# Patient Record
Sex: Female | Born: 1940 | Race: Black or African American | Hispanic: No | Smoking: Never smoker
Health system: Southern US, Community
[De-identification: ages and names within clinical notes are randomized; demographics above are authoritative.]

## PROBLEM LIST (undated history)

## (undated) DIAGNOSIS — I509 Heart failure, unspecified: Secondary | ICD-10-CM

## (undated) DIAGNOSIS — T4145XA Adverse effect of unspecified anesthetic, initial encounter: Secondary | ICD-10-CM

## (undated) DIAGNOSIS — I639 Cerebral infarction, unspecified: Secondary | ICD-10-CM

## (undated) DIAGNOSIS — I10 Essential (primary) hypertension: Secondary | ICD-10-CM

## (undated) DIAGNOSIS — I469 Cardiac arrest, cause unspecified: Secondary | ICD-10-CM

## (undated) DIAGNOSIS — E119 Type 2 diabetes mellitus without complications: Secondary | ICD-10-CM

## (undated) DIAGNOSIS — Z8719 Personal history of other diseases of the digestive system: Secondary | ICD-10-CM

## (undated) DIAGNOSIS — K219 Gastro-esophageal reflux disease without esophagitis: Secondary | ICD-10-CM

## (undated) DIAGNOSIS — T8859XA Other complications of anesthesia, initial encounter: Secondary | ICD-10-CM

## (undated) DIAGNOSIS — I739 Peripheral vascular disease, unspecified: Secondary | ICD-10-CM

## (undated) DIAGNOSIS — I214 Non-ST elevation (NSTEMI) myocardial infarction: Secondary | ICD-10-CM

## (undated) DIAGNOSIS — I209 Angina pectoris, unspecified: Secondary | ICD-10-CM

## (undated) DIAGNOSIS — E7800 Pure hypercholesterolemia, unspecified: Secondary | ICD-10-CM

## (undated) DIAGNOSIS — I219 Acute myocardial infarction, unspecified: Secondary | ICD-10-CM

## (undated) DIAGNOSIS — I4901 Ventricular fibrillation: Secondary | ICD-10-CM

## (undated) HISTORY — PX: BREAST CYST EXCISION: SHX579

## (undated) HISTORY — PX: CORONARY ARTERY BYPASS GRAFT: SHX141

## (undated) HISTORY — PX: ABDOMINAL HYSTERECTOMY: SHX81

## (undated) HISTORY — PX: BREAST BIOPSY: SHX20

---

## 2006-10-29 ENCOUNTER — Encounter: Admission: RE | Admit: 2006-10-29 | Discharge: 2006-10-29 | Payer: Self-pay | Admitting: Internal Medicine

## 2006-12-29 ENCOUNTER — Encounter: Admission: RE | Admit: 2006-12-29 | Discharge: 2007-03-29 | Payer: Self-pay | Admitting: Internal Medicine

## 2007-05-11 ENCOUNTER — Encounter: Admission: RE | Admit: 2007-05-11 | Discharge: 2007-05-11 | Payer: Self-pay | Admitting: Internal Medicine

## 2007-06-03 ENCOUNTER — Encounter: Admission: RE | Admit: 2007-06-03 | Discharge: 2007-06-03 | Payer: Self-pay | Admitting: Internal Medicine

## 2007-06-18 ENCOUNTER — Encounter: Admission: RE | Admit: 2007-06-18 | Discharge: 2007-06-18 | Payer: Self-pay | Admitting: Internal Medicine

## 2007-06-25 ENCOUNTER — Encounter: Admission: RE | Admit: 2007-06-25 | Discharge: 2007-06-25 | Payer: Self-pay | Admitting: Internal Medicine

## 2008-02-09 ENCOUNTER — Emergency Department (HOSPITAL_COMMUNITY): Admission: EM | Admit: 2008-02-09 | Discharge: 2008-02-10 | Payer: Self-pay | Admitting: Emergency Medicine

## 2008-03-10 ENCOUNTER — Ambulatory Visit (HOSPITAL_COMMUNITY): Admission: RE | Admit: 2008-03-10 | Discharge: 2008-03-10 | Payer: Self-pay | Admitting: Internal Medicine

## 2008-06-12 ENCOUNTER — Emergency Department (HOSPITAL_COMMUNITY): Admission: EM | Admit: 2008-06-12 | Discharge: 2008-06-12 | Payer: Self-pay | Admitting: Emergency Medicine

## 2008-08-15 ENCOUNTER — Encounter: Admission: RE | Admit: 2008-08-15 | Discharge: 2008-08-15 | Payer: Self-pay | Admitting: Internal Medicine

## 2009-04-18 ENCOUNTER — Encounter: Admission: RE | Admit: 2009-04-18 | Discharge: 2009-04-18 | Payer: Self-pay | Admitting: Internal Medicine

## 2009-04-20 ENCOUNTER — Encounter: Admission: RE | Admit: 2009-04-20 | Discharge: 2009-04-20 | Payer: Self-pay | Admitting: Internal Medicine

## 2010-03-17 ENCOUNTER — Encounter: Payer: Self-pay | Admitting: Emergency Medicine

## 2010-03-18 ENCOUNTER — Encounter: Payer: Self-pay | Admitting: Internal Medicine

## 2010-06-10 ENCOUNTER — Other Ambulatory Visit (HOSPITAL_COMMUNITY): Payer: Self-pay | Admitting: Internal Medicine

## 2010-06-13 ENCOUNTER — Inpatient Hospital Stay (HOSPITAL_COMMUNITY): Admission: RE | Admit: 2010-06-13 | Payer: Self-pay | Source: Ambulatory Visit

## 2010-06-13 ENCOUNTER — Ambulatory Visit (HOSPITAL_COMMUNITY): Payer: Self-pay

## 2010-06-14 ENCOUNTER — Other Ambulatory Visit (HOSPITAL_COMMUNITY): Payer: Self-pay | Admitting: Internal Medicine

## 2010-06-18 ENCOUNTER — Other Ambulatory Visit (HOSPITAL_COMMUNITY): Payer: Self-pay | Admitting: Internal Medicine

## 2010-06-19 ENCOUNTER — Ambulatory Visit (HOSPITAL_COMMUNITY): Payer: Self-pay

## 2010-06-19 ENCOUNTER — Other Ambulatory Visit (HOSPITAL_COMMUNITY): Payer: Self-pay

## 2010-06-19 ENCOUNTER — Ambulatory Visit (HOSPITAL_COMMUNITY)
Admission: RE | Admit: 2010-06-19 | Discharge: 2010-06-19 | Disposition: A | Discharge status: Home / Self Care | Payer: Medicare HMO | Source: Ambulatory Visit | Attending: Internal Medicine | Admitting: Internal Medicine

## 2010-06-28 ENCOUNTER — Ambulatory Visit (HOSPITAL_COMMUNITY)
Admission: RE | Admit: 2010-06-28 | Discharge: 2010-06-28 | Disposition: A | Discharge status: Home / Self Care | Payer: Medicare HMO | Source: Ambulatory Visit | Attending: Internal Medicine | Admitting: Internal Medicine

## 2010-06-28 ENCOUNTER — Other Ambulatory Visit (HOSPITAL_COMMUNITY): Payer: Self-pay | Admitting: Internal Medicine

## 2010-06-28 DIAGNOSIS — R1310 Dysphagia, unspecified: Secondary | ICD-10-CM | POA: Insufficient documentation

## 2010-11-13 ENCOUNTER — Other Ambulatory Visit: Payer: Self-pay | Admitting: Internal Medicine

## 2010-11-13 ENCOUNTER — Ambulatory Visit
Admission: RE | Admit: 2010-11-13 | Discharge: 2010-11-13 | Disposition: A | Discharge status: Home / Self Care | Payer: Medicare HMO | Source: Ambulatory Visit | Attending: Internal Medicine | Admitting: Internal Medicine

## 2010-11-13 DIAGNOSIS — R05 Cough: Secondary | ICD-10-CM

## 2011-02-20 ENCOUNTER — Encounter: Payer: Self-pay | Admitting: Emergency Medicine

## 2011-02-20 ENCOUNTER — Emergency Department (HOSPITAL_COMMUNITY)
Admission: EM | Admit: 2011-02-20 | Discharge: 2011-02-20 | Disposition: A | Discharge status: Home / Self Care | Payer: Medicare HMO | Attending: Emergency Medicine | Admitting: Emergency Medicine

## 2011-02-20 ENCOUNTER — Emergency Department (HOSPITAL_COMMUNITY): Payer: Medicare HMO

## 2011-02-20 DIAGNOSIS — R1310 Dysphagia, unspecified: Secondary | ICD-10-CM | POA: Insufficient documentation

## 2011-02-20 DIAGNOSIS — I10 Essential (primary) hypertension: Secondary | ICD-10-CM | POA: Insufficient documentation

## 2011-02-20 DIAGNOSIS — Z8673 Personal history of transient ischemic attack (TIA), and cerebral infarction without residual deficits: Secondary | ICD-10-CM | POA: Insufficient documentation

## 2011-02-20 DIAGNOSIS — Z794 Long term (current) use of insulin: Secondary | ICD-10-CM | POA: Insufficient documentation

## 2011-02-20 DIAGNOSIS — E7800 Pure hypercholesterolemia, unspecified: Secondary | ICD-10-CM | POA: Insufficient documentation

## 2011-02-20 DIAGNOSIS — R079 Chest pain, unspecified: Secondary | ICD-10-CM | POA: Insufficient documentation

## 2011-02-20 DIAGNOSIS — R070 Pain in throat: Secondary | ICD-10-CM | POA: Insufficient documentation

## 2011-02-20 DIAGNOSIS — R471 Dysarthria and anarthria: Secondary | ICD-10-CM | POA: Insufficient documentation

## 2011-02-20 HISTORY — DX: Essential (primary) hypertension: I10

## 2011-02-20 HISTORY — DX: Cerebral infarction, unspecified: I63.9

## 2011-02-20 HISTORY — DX: Pure hypercholesterolemia, unspecified: E78.00

## 2011-02-20 LAB — GLUCOSE, CAPILLARY
Glucose-Capillary: 132 mg/dL — ABNORMAL HIGH (ref 70–99)
Glucose-Capillary: 125 mg/dL — ABNORMAL HIGH (ref 70–99)

## 2011-02-20 MED ORDER — PANTOPRAZOLE SODIUM 40 MG PO PACK
40.0000 mg | PACK | Freq: Every day | ORAL | Status: DC
  Administered 2011-02-20: 40 mg via ORAL
  Filled 2011-02-20: qty 20

## 2011-02-20 MED ORDER — PANTOPRAZOLE SODIUM 40 MG PO PACK: 40.0000 mg | PACK | ORAL | Status: DC

## 2011-02-20 MED ORDER — PANTOPRAZOLE SODIUM 40 MG PO PACK
40.0000 mg | PACK | ORAL | Status: AC
  Administered 2011-02-20: 40 mg via ORAL
  Filled 2011-02-20: qty 20

## 2011-02-20 MED ORDER — DEXTROSE 50 % IV SOLN
INTRAVENOUS | Status: AC
  Filled 2011-02-20: qty 50

## 2011-02-20 MED ORDER — DEXTROSE 50 % IV SOLN
50.0000 mL | Freq: Once | INTRAVENOUS | Status: AC
  Administered 2011-02-20: 12:00:00 via INTRAVENOUS
  Filled 2011-02-20: qty 50

## 2011-02-20 MED ORDER — GLUCAGON HCL (RDNA) 1 MG IJ SOLR
1.0000 mg | Freq: Once | INTRAMUSCULAR | Status: AC
  Administered 2011-02-20: 1 mg via INTRAVENOUS
  Filled 2011-02-20: qty 1

## 2011-02-20 MED ORDER — GLUCOSE 40 % PO GEL
ORAL | Status: AC
  Administered 2011-02-20: 12:00:00
  Filled 2011-02-20: qty 1

## 2011-02-20 NOTE — ED Notes (Signed)
CBG 26 at 11:48

## 2011-02-20 NOTE — ED Provider Notes (Signed)
History     CSN: [id number]  Arrival date & time 02/20/11  1124   First MD Initiated Contact with Patient 02/20/11 1144      Chief Complaint  Patient presents with  . Sore Throat    x 1 week    (Consider location/radiation/quality/duration/timing/severity/associated sxs/prior treatment) Patient is a 70 y.o. female presenting with pharyngitis. The history is provided by the patient and the EMS personnel. No language interpreter was used.  Sore Throat This is a recurrent problem. The current episode started yesterday. The problem occurs constantly (With every swallow). The problem has been unchanged. Associated symptoms include chest pain. Pertinent negatives include no chills, coughing, nausea or vomiting. Associated symptoms comments: Dysphagia. The symptoms are aggravated by drinking. She has tried nothing for the symptoms. The treatment provided no relief.    Past Medical History  Diagnosis Date  . Stroke   . Hypertension   . Hypercholesteremia     History reviewed. No pertinent past surgical history.  History reviewed. No pertinent family history.  History  Substance Use Topics  . Smoking status: Not on file  . Smokeless tobacco: Not on file  . Alcohol Use: No    OB History    Grav Para Term Preterm Abortions TAB SAB Ect Mult Living                  Review of Systems  Constitutional: Negative for chills.  Respiratory: Negative for cough.   Cardiovascular: Positive for chest pain.  Gastrointestinal: Negative for nausea and vomiting.  All other systems reviewed and are negative.    Allergies  Review of patient's allergies indicates no known allergies.  Home Medications   Current Outpatient Rx  Name Route Sig Dispense Refill  . INSULIN ASPART PROT & ASPART (70-30) 100 UNIT/ML [place] SUSP Subcutaneous Inject 40 Units into the skin 2 (two) times daily. 27 units-morning 18 units-evening       BP 125/71  Pulse 88  Temp(Src) 97.9 F (36.6 C) (Oral)  Resp  20  SpO2 100%  Physical Exam  Nursing note and vitals reviewed. Constitutional: She is oriented to person, place, and time. She appears well-developed and well-nourished. No distress.  HENT:  Head: Normocephalic and atraumatic.  Eyes: EOM are normal. Pupils are equal, round, and reactive to light.  Neck: Normal range of motion. Neck supple.  Cardiovascular: Normal rate and regular rhythm.  Exam reveals no friction rub.   No murmur heard. Pulmonary/Chest: Effort normal and breath sounds normal. No respiratory distress. She has no wheezes. She has no rales.  Abdominal: Soft. She exhibits no distension. There is no tenderness. There is no rebound.  Musculoskeletal: Normal range of motion. She exhibits no edema.       Right side contracted from previous stroke.  Neurological: She is alert and oriented to person, place, and time.       Patient has severe dysarthria secondary to her previous stroke. She is able to understand questions though and writes down her answers to what she wants to say.  Skin: She is not diaphoretic.    ED Course  Procedures (including critical care time)  Labs Reviewed  GLUCOSE, CAPILLARY - Abnormal; Notable for the following:    Glucose-Capillary 26 (*)    All other components within normal limits  POCT CBG MONITORING   Dg Chest 2 View  02/20/2011  *RADIOLOGY REPORT*  Clinical Data: Chest pain and trouble swallowing.  Cough.  CHEST - 2 VIEW  Comparison: 11/13/2010.  Findings: Trachea is midline.  Heart size normal.  There may be mild bibasilar atelectasis.  Lungs are low in volume.  No pleural fluid.  IMPRESSION: Low lung volumes with probable mild bibasilar atelectasis.  Original Report Authenticated By: Reyes Ivan, M.D.     1. Dysphagia     MDM  A 70 year old female presents with odynophagia.  patient has a history of stroke and is in a rehabilitation center. The rehabilitation therapist that she was having chest pain. Patient has history of  hiatal hernia and feels like her hiatal hernia is acting up. Patient wrote to me that she feels like she swallowed a rock. The patient has not eaten any rocks.  Patient last night however does not feel like food is stuck in her chest. Patient is not having any dyspnea or pain she feels is related to her heart. No nausea or vomiting. Denies fevers. On arrival afebrile vital signs stable. A blood glucose check was found to have a glucose of 23. Patient was given D50 and IV and also was given glucose gel in her mouth. Patient is drooling, however she is able to swallow.  Unable to tell if drooling is secondary to her previous stroke or secondary to her dysphagia today. We'll check chest x-ray and give glucagon IV. Glucagon will help with her blood sugar and also help with any esophageal spasm. Spoke with patient's family state that her drooling as a consequence of her stroke. Informed patient lives at home and has been home speech therapy and home health. Patient has had similar complaints before and pain has been going on for about a week however increased last night.  And repeat glucose checks were stable in the low 100s. Patient was able to tolerate swallowing liquids and thick liquids and applesauce after on administration. On further discussion with the family patient has history of acid reflux. Patient given liquid PPI medication On reexam, patient reports she is feeling better. Tolerating by mouth well without complication. Patient stable for discharge. Patient given a prescription for Protonix liquid and instructed to follow up with her regular doctor this week.  Elwin Mocha, MD 02/20/11 [phone]

## 2011-02-20 NOTE — ED Notes (Signed)
CBG 132 at 12:36

## 2011-02-20 NOTE — ED Notes (Signed)
CBG 125 at 14:05

## 2011-02-20 NOTE — ED Notes (Signed)
Pt placed in gown, on monitor with continuous blood pressure and pulse oximetry 

## 2011-02-20 NOTE — ED Notes (Signed)
Received pt from [facility] for further eval of Throat pain x 1 week. Pt has history of CVA with right sided deficit and non-verbal.

## 2011-02-22 NOTE — ED Provider Notes (Signed)
I saw and evaluated the patient, reviewed the resident's note and I agree with the findings and plan. Pt c/o chronic dysphagia post prior cva. Pt says nothing got stuck when swallowing, no esophageal fb sensation. No sob. Pt able to swallow/drink fluids/meal in ed. bs remains normal.   Suzi Roots, MD 02/22/11 [phone]

## 2011-02-25 HISTORY — PX: CARDIAC CATHETERIZATION: SHX172

## 2011-03-09 ENCOUNTER — Encounter (HOSPITAL_COMMUNITY): Payer: Self-pay | Admitting: Emergency Medicine

## 2011-03-09 ENCOUNTER — Emergency Department (HOSPITAL_COMMUNITY): Payer: Medicare HMO

## 2011-03-09 ENCOUNTER — Inpatient Hospital Stay (HOSPITAL_COMMUNITY)
Admission: EM | Admit: 2011-03-09 | Discharge: 2011-03-13 | DRG: 281 | Disposition: A | Discharge status: Home / Self Care | Payer: Medicare HMO | Attending: Internal Medicine | Admitting: Internal Medicine

## 2011-03-09 ENCOUNTER — Other Ambulatory Visit: Payer: Self-pay

## 2011-03-09 DIAGNOSIS — I69920 Aphasia following unspecified cerebrovascular disease: Secondary | ICD-10-CM

## 2011-03-09 DIAGNOSIS — Z9861 Coronary angioplasty status: Secondary | ICD-10-CM

## 2011-03-09 DIAGNOSIS — I69959 Hemiplegia and hemiparesis following unspecified cerebrovascular disease affecting unspecified side: Secondary | ICD-10-CM

## 2011-03-09 DIAGNOSIS — N390 Urinary tract infection, site not specified: Secondary | ICD-10-CM | POA: Diagnosis present

## 2011-03-09 DIAGNOSIS — I214 Non-ST elevation (NSTEMI) myocardial infarction: Secondary | ICD-10-CM

## 2011-03-09 DIAGNOSIS — T82897A Other specified complication of cardiac prosthetic devices, implants and grafts, initial encounter: Secondary | ICD-10-CM | POA: Diagnosis present

## 2011-03-09 DIAGNOSIS — Z951 Presence of aortocoronary bypass graft: Secondary | ICD-10-CM

## 2011-03-09 DIAGNOSIS — Z794 Long term (current) use of insulin: Secondary | ICD-10-CM

## 2011-03-09 DIAGNOSIS — I2510 Atherosclerotic heart disease of native coronary artery without angina pectoris: Secondary | ICD-10-CM | POA: Diagnosis present

## 2011-03-09 DIAGNOSIS — K449 Diaphragmatic hernia without obstruction or gangrene: Secondary | ICD-10-CM | POA: Diagnosis present

## 2011-03-09 DIAGNOSIS — Z7902 Long term (current) use of antithrombotics/antiplatelets: Secondary | ICD-10-CM

## 2011-03-09 DIAGNOSIS — I25810 Atherosclerosis of coronary artery bypass graft(s) without angina pectoris: Secondary | ICD-10-CM | POA: Diagnosis present

## 2011-03-09 DIAGNOSIS — I69922 Dysarthria following unspecified cerebrovascular disease: Secondary | ICD-10-CM

## 2011-03-09 DIAGNOSIS — E785 Hyperlipidemia, unspecified: Secondary | ICD-10-CM | POA: Diagnosis present

## 2011-03-09 DIAGNOSIS — B964 Proteus (mirabilis) (morganii) as the cause of diseases classified elsewhere: Secondary | ICD-10-CM | POA: Diagnosis present

## 2011-03-09 DIAGNOSIS — R079 Chest pain, unspecified: Secondary | ICD-10-CM | POA: Diagnosis present

## 2011-03-09 DIAGNOSIS — K219 Gastro-esophageal reflux disease without esophagitis: Secondary | ICD-10-CM | POA: Diagnosis present

## 2011-03-09 DIAGNOSIS — I10 Essential (primary) hypertension: Secondary | ICD-10-CM | POA: Diagnosis present

## 2011-03-09 DIAGNOSIS — I639 Cerebral infarction, unspecified: Secondary | ICD-10-CM | POA: Diagnosis present

## 2011-03-09 DIAGNOSIS — E119 Type 2 diabetes mellitus without complications: Secondary | ICD-10-CM | POA: Diagnosis present

## 2011-03-09 DIAGNOSIS — Y849 Medical procedure, unspecified as the cause of abnormal reaction of the patient, or of later complication, without mention of misadventure at the time of the procedure: Secondary | ICD-10-CM | POA: Diagnosis present

## 2011-03-09 HISTORY — DX: Gastro-esophageal reflux disease without esophagitis: K21.9

## 2011-03-09 HISTORY — DX: Acute myocardial infarction, unspecified: I21.9

## 2011-03-09 HISTORY — DX: Personal history of other diseases of the digestive system: Z87.19

## 2011-03-09 HISTORY — DX: Angina pectoris, unspecified: I20.9

## 2011-03-09 HISTORY — DX: Other complications of anesthesia, initial encounter: T88.59XA

## 2011-03-09 HISTORY — DX: Peripheral vascular disease, unspecified: I73.9

## 2011-03-09 HISTORY — DX: Adverse effect of unspecified anesthetic, initial encounter: T41.45XA

## 2011-03-09 MED ORDER — ASPIRIN 325 MG PO TABS
325.0000 mg | ORAL_TABLET | ORAL | Status: AC
  Administered 2011-03-10: 325 mg via ORAL
  Filled 2011-03-09: qty 1

## 2011-03-09 MED ORDER — NITROGLYCERIN 0.4 MG SL SUBL
0.4000 mg | SUBLINGUAL_TABLET | SUBLINGUAL | Status: DC | PRN
  Administered 2011-03-10 – 2011-03-11 (×5): 0.4 mg via SUBLINGUAL
  Filled 2011-03-09 (×5): qty 25

## 2011-03-09 NOTE — ED Notes (Signed)
Pt from home non-verbal at baseline pt contacted daughter today and states she has been having chest pain last several days. Denies SOB . Also c/o abd pain over same time period pt currently smells of very strong urine

## 2011-03-10 ENCOUNTER — Other Ambulatory Visit: Payer: Self-pay

## 2011-03-10 ENCOUNTER — Encounter (HOSPITAL_COMMUNITY): Payer: Self-pay | Admitting: Internal Medicine

## 2011-03-10 ENCOUNTER — Encounter (HOSPITAL_COMMUNITY)
Admission: EM | Disposition: A | Discharge status: Home / Self Care | Payer: Self-pay | Source: Home / Self Care | Attending: Internal Medicine

## 2011-03-10 DIAGNOSIS — Z9861 Coronary angioplasty status: Secondary | ICD-10-CM

## 2011-03-10 DIAGNOSIS — I2510 Atherosclerotic heart disease of native coronary artery without angina pectoris: Secondary | ICD-10-CM | POA: Diagnosis present

## 2011-03-10 DIAGNOSIS — I639 Cerebral infarction, unspecified: Secondary | ICD-10-CM | POA: Diagnosis present

## 2011-03-10 DIAGNOSIS — I10 Essential (primary) hypertension: Secondary | ICD-10-CM | POA: Diagnosis present

## 2011-03-10 DIAGNOSIS — Z951 Presence of aortocoronary bypass graft: Secondary | ICD-10-CM

## 2011-03-10 DIAGNOSIS — R079 Chest pain, unspecified: Secondary | ICD-10-CM | POA: Diagnosis present

## 2011-03-10 HISTORY — PX: LEFT HEART CATHETERIZATION WITH CORONARY ANGIOGRAM: SHX5451

## 2011-03-10 LAB — CBC
MCH: 26.1 pg (ref 26.0–34.0)
MCH: 27.1 pg (ref 26.0–34.0)
MCHC: 31.3 g/dL (ref 30.0–36.0)
MCHC: 32.3 g/dL (ref 30.0–36.0)
MCV: 83.7 fL (ref 78.0–100.0)
Platelets: 166 10*3/uL (ref 150–400)
Platelets: 167 10*3/uL (ref 150–400)
RBC: 4.25 MIL/uL (ref 3.87–5.11)
RBC: 4.43 MIL/uL (ref 3.87–5.11)

## 2011-03-10 LAB — LIPID PANEL
LDL Cholesterol: 137 mg/dL — ABNORMAL HIGH (ref 0–99)
Total CHOL/HDL Ratio: 4.3 RATIO
Triglycerides: 52 mg/dL (ref <150)
VLDL: 10 mg/dL (ref 0–40)

## 2011-03-10 LAB — GLUCOSE, CAPILLARY
Glucose-Capillary: 215 mg/dL — ABNORMAL HIGH (ref 70–99)
Glucose-Capillary: 110 mg/dL — ABNORMAL HIGH (ref 70–99)

## 2011-03-10 LAB — URINE CULTURE
Colony Count: >=100000
Culture  Setup Time: 201301140837

## 2011-03-10 LAB — PROTIME-INR: INR: 1.09 (ref 0.00–1.49)

## 2011-03-10 LAB — BASIC METABOLIC PANEL
CO2: 26 mEq/L (ref 19–32)
Calcium: 9.4 mg/dL (ref 8.4–10.5)
Creatinine, Ser: 0.99 mg/dL (ref 0.50–1.10)
GFR calc non Af Amer: 56 mL/min — ABNORMAL LOW (ref >90)
Glucose, Bld: 255 mg/dL — ABNORMAL HIGH (ref 70–99)
Sodium: 139 mEq/L (ref 135–145)

## 2011-03-10 LAB — URINE MICROSCOPIC-ADD ON

## 2011-03-10 LAB — CARDIAC PANEL(CRET KIN+CKTOT+MB+TROPI)
CK, MB: 19.3 ng/mL (ref 0.3–4.0)
Relative Index: 7.2 — ABNORMAL HIGH (ref 0.0–2.5)
Relative Index: 7.2 — ABNORMAL HIGH (ref 0.0–2.5)
Relative Index: 5.6 — ABNORMAL HIGH (ref 0.0–2.5)
Total CK: 232 U/L — ABNORMAL HIGH (ref 7–177)
Total CK: 267 U/L — ABNORMAL HIGH (ref 7–177)
Troponin I: 4.19 ng/mL (ref <0.30)

## 2011-03-10 LAB — HEMOGLOBIN A1C
Hgb A1c MFr Bld: 7.7 % — ABNORMAL HIGH (ref <5.7)
Mean Plasma Glucose: 174 mg/dL — ABNORMAL HIGH (ref <117)

## 2011-03-10 LAB — COMPREHENSIVE METABOLIC PANEL
AST: 29 U/L (ref 0–37)
CO2: 25 mEq/L (ref 19–32)
Chloride: 107 mEq/L (ref 96–112)
Creatinine, Ser: 0.85 mg/dL (ref 0.50–1.10)
GFR calc Af Amer: 79 mL/min — ABNORMAL LOW (ref >90)
GFR calc non Af Amer: 68 mL/min — ABNORMAL LOW (ref >90)
Glucose, Bld: 228 mg/dL — ABNORMAL HIGH (ref 70–99)
Total Bilirubin: 0.2 mg/dL — ABNORMAL LOW (ref 0.3–1.2)

## 2011-03-10 LAB — URINALYSIS, ROUTINE W REFLEX MICROSCOPIC
Protein, ur: NEGATIVE mg/dL
Urobilinogen, UA: 1 mg/dL (ref 0.0–1.0)

## 2011-03-10 LAB — PRO B NATRIURETIC PEPTIDE: Pro B Natriuretic peptide (BNP): 185.3 pg/mL — ABNORMAL HIGH (ref 0–125)

## 2011-03-10 LAB — TSH: TSH: 1.971 u[IU]/mL (ref 0.350–4.500)

## 2011-03-10 SURGERY — LEFT HEART CATHETERIZATION WITH CORONARY ANGIOGRAM
Anesthesia: LOCAL

## 2011-03-10 MED ORDER — SODIUM CHLORIDE 0.9 % IJ SOLN: 3.0000 mL | INTRAMUSCULAR | Status: DC | PRN

## 2011-03-10 MED ORDER — HEPARIN BOLUS VIA INFUSION
4000.0000 [IU] | Freq: Once | INTRAVENOUS | Status: AC
  Administered 2011-03-10: 4000 [IU] via INTRAVENOUS
  Filled 2011-03-10: qty 4000

## 2011-03-10 MED ORDER — ONDANSETRON HCL 4 MG/2ML IJ SOLN: 4.0000 mg | Freq: Four times a day (QID) | INTRAMUSCULAR | Status: DC | PRN

## 2011-03-10 MED ORDER — HYDROCHLOROTHIAZIDE 25 MG PO TABS
25.0000 mg | ORAL_TABLET | Freq: Every day | ORAL | Status: DC
  Filled 2011-03-10: qty 1

## 2011-03-10 MED ORDER — NITROGLYCERIN 0.2 MG/ML ON CALL CATH LAB
INTRAVENOUS | Status: AC
  Filled 2011-03-10: qty 1

## 2011-03-10 MED ORDER — CLOPIDOGREL BISULFATE 75 MG PO TABS
75.0000 mg | ORAL_TABLET | Freq: Every day | ORAL | Status: DC
  Administered 2011-03-10 – 2011-03-13 (×4): 75 mg via ORAL
  Filled 2011-03-10 (×4): qty 1

## 2011-03-10 MED ORDER — FAMOTIDINE 40 MG PO TABS
40.0000 mg | ORAL_TABLET | Freq: Every day | ORAL | Status: DC
  Administered 2011-03-11 – 2011-03-13 (×2): 40 mg via ORAL
  Filled 2011-03-10 (×5): qty 1

## 2011-03-10 MED ORDER — ALUM & MAG HYDROXIDE-SIMETH 200-200-20 MG/5ML PO SUSP
30.0000 mL | ORAL | Status: DC | PRN
  Administered 2011-03-10 – 2011-03-12 (×4): 30 mL via ORAL
  Filled 2011-03-10 (×3): qty 30

## 2011-03-10 MED ORDER — CLOPIDOGREL BISULFATE 75 MG PO TABS: 75.0000 mg | ORAL_TABLET | Freq: Every day | ORAL | Status: DC

## 2011-03-10 MED ORDER — HEPARIN (PORCINE) IN NACL 2-0.9 UNIT/ML-% IJ SOLN
INTRAMUSCULAR | Status: AC
  Filled 2011-03-10: qty 2000

## 2011-03-10 MED ORDER — INSULIN NPH (HUMAN) (ISOPHANE) 100 UNIT/ML ~~LOC~~ SUSP
10.0000 [IU] | Freq: Once | SUBCUTANEOUS | Status: AC
  Administered 2011-03-10: 10 [IU] via SUBCUTANEOUS
  Filled 2011-03-10: qty 10

## 2011-03-10 MED ORDER — NITROGLYCERIN 0.4 MG/HR TD PT24
0.4000 mg | MEDICATED_PATCH | Freq: Every day | TRANSDERMAL | Status: DC
  Administered 2011-03-10 – 2011-03-13 (×4): 0.4 mg via TRANSDERMAL
  Filled 2011-03-10 (×4): qty 1

## 2011-03-10 MED ORDER — ACETAMINOPHEN 325 MG PO TABS: 650.0000 mg | ORAL_TABLET | ORAL | Status: DC | PRN

## 2011-03-10 MED ORDER — MIDAZOLAM HCL 2 MG/2ML IJ SOLN
INTRAMUSCULAR | Status: AC
  Filled 2011-03-10: qty 2

## 2011-03-10 MED ORDER — INSULIN ASPART PROT & ASPART (70-30 MIX) 100 UNIT/ML ~~LOC~~ SUSP
40.0000 [IU] | Freq: Two times a day (BID) | SUBCUTANEOUS | Status: DC
  Filled 2011-03-10: qty 3

## 2011-03-10 MED ORDER — SODIUM CHLORIDE 0.9 % IV SOLN
INTRAVENOUS | Status: AC
  Administered 2011-03-10: 03:00:00 via INTRAVENOUS

## 2011-03-10 MED ORDER — INSULIN ASPART PROT & ASPART (70-30 MIX) 100 UNIT/ML ~~LOC~~ SUSP: 40.0000 [IU] | Freq: Two times a day (BID) | SUBCUTANEOUS | Status: DC

## 2011-03-10 MED ORDER — ACETAMINOPHEN 325 MG PO TABS
650.0000 mg | ORAL_TABLET | Freq: Four times a day (QID) | ORAL | Status: DC | PRN
  Administered 2011-03-12: 650 mg via ORAL
  Filled 2011-03-10: qty 2

## 2011-03-10 MED ORDER — HEPARIN SOD (PORCINE) IN D5W 100 UNIT/ML IV SOLN
1000.0000 [IU]/h | INTRAVENOUS | Status: DC
  Filled 2011-03-10 (×2): qty 250

## 2011-03-10 MED ORDER — INSULIN ASPART 100 UNIT/ML ~~LOC~~ SOLN
0.0000 [IU] | Freq: Three times a day (TID) | SUBCUTANEOUS | Status: DC
  Filled 2011-03-10: qty 3

## 2011-03-10 MED ORDER — SODIUM CHLORIDE 0.9 % IV SOLN: INTRAVENOUS | Status: DC

## 2011-03-10 MED ORDER — NITROGLYCERIN IN D5W 200-5 MCG/ML-% IV SOLN: 2.0000 ug/min | Freq: Once | INTRAVENOUS | Status: DC

## 2011-03-10 MED ORDER — HEPARIN (PORCINE) IN NACL 100-0.45 UNIT/ML-% IJ SOLN: 14.0000 [IU]/kg/h | Freq: Once | INTRAMUSCULAR | Status: DC

## 2011-03-10 MED ORDER — ASPIRIN 81 MG PO CHEW: 324.0000 mg | CHEWABLE_TABLET | Freq: Once | ORAL | Status: DC

## 2011-03-10 MED ORDER — HEPARIN SOD (PORCINE) IN D5W 100 UNIT/ML IV SOLN
14.0000 [IU]/kg/h | INTRAVENOUS | Status: DC
  Administered 2011-03-10: 14 [IU]/kg/h via INTRAVENOUS
  Filled 2011-03-10: qty 250

## 2011-03-10 MED ORDER — ACETAMINOPHEN 650 MG RE SUPP: 650.0000 mg | Freq: Four times a day (QID) | RECTAL | Status: DC | PRN

## 2011-03-10 MED ORDER — SODIUM CHLORIDE 0.9 % IV SOLN: 250.0000 mL | INTRAVENOUS | Status: DC | PRN

## 2011-03-10 MED ORDER — DEXTROSE 5 % IV SOLN
1.0000 g | Freq: Every day | INTRAVENOUS | Status: DC
  Administered 2011-03-10 – 2011-03-12 (×3): 1 g via INTRAVENOUS
  Filled 2011-03-10 (×4): qty 10

## 2011-03-10 MED ORDER — SODIUM CHLORIDE 0.9 % IJ SOLN
3.0000 mL | Freq: Two times a day (BID) | INTRAMUSCULAR | Status: DC
  Administered 2011-03-10 – 2011-03-13 (×5): 3 mL via INTRAVENOUS

## 2011-03-10 MED ORDER — LIDOCAINE HCL (PF) 1 % IJ SOLN
INTRAMUSCULAR | Status: AC
  Filled 2011-03-10: qty 30

## 2011-03-10 MED ORDER — PANTOPRAZOLE SODIUM 40 MG PO TBEC: 40.0000 mg | DELAYED_RELEASE_TABLET | Freq: Every day | ORAL | Status: DC

## 2011-03-10 MED ORDER — ONDANSETRON HCL 4 MG PO TABS: 4.0000 mg | ORAL_TABLET | Freq: Four times a day (QID) | ORAL | Status: DC | PRN

## 2011-03-10 MED ORDER — ROSUVASTATIN CALCIUM 40 MG PO TABS
40.0000 mg | ORAL_TABLET | Freq: Every day | ORAL | Status: DC
  Administered 2011-03-11 – 2011-03-12 (×2): 40 mg via ORAL
  Filled 2011-03-10 (×4): qty 1

## 2011-03-10 MED ORDER — ENOXAPARIN SODIUM 40 MG/0.4ML ~~LOC~~ SOLN
40.0000 mg | SUBCUTANEOUS | Status: DC
  Filled 2011-03-10: qty 0.4

## 2011-03-10 MED ORDER — DEXTROSE 5 % IV SOLN
1.0000 g | Freq: Once | INTRAVENOUS | Status: AC
  Administered 2011-03-10: 1 g via INTRAVENOUS
  Filled 2011-03-10: qty 10

## 2011-03-10 MED ORDER — ASPIRIN EC 81 MG PO TBEC
81.0000 mg | DELAYED_RELEASE_TABLET | Freq: Every day | ORAL | Status: DC
  Administered 2011-03-10 – 2011-03-13 (×4): 81 mg via ORAL
  Filled 2011-03-10 (×4): qty 1

## 2011-03-10 MED ORDER — BIVALIRUDIN 250 MG IV SOLR
INTRAVENOUS | Status: AC
  Filled 2011-03-10: qty 250

## 2011-03-10 MED ORDER — INSULIN ASPART 100 UNIT/ML ~~LOC~~ SOLN
0.0000 [IU] | SUBCUTANEOUS | Status: DC
  Administered 2011-03-10: 5 [IU] via SUBCUTANEOUS
  Administered 2011-03-10: 4 [IU] via SUBCUTANEOUS
  Administered 2011-03-11: 7 [IU] via SUBCUTANEOUS
  Administered 2011-03-11 (×3): 4 [IU] via SUBCUTANEOUS
  Administered 2011-03-12: 15 [IU] via SUBCUTANEOUS
  Administered 2011-03-12: 2 [IU] via SUBCUTANEOUS
  Administered 2011-03-12: 3 [IU] via SUBCUTANEOUS
  Administered 2011-03-12: 7 [IU] via SUBCUTANEOUS
  Administered 2011-03-12: 3 [IU] via SUBCUTANEOUS
  Administered 2011-03-12: 7 [IU] via SUBCUTANEOUS
  Administered 2011-03-13: 4 [IU] via SUBCUTANEOUS
  Administered 2011-03-13: 7 [IU] via SUBCUTANEOUS
  Administered 2011-03-13: 3 [IU] via SUBCUTANEOUS
  Administered 2011-03-13: 7 [IU] via SUBCUTANEOUS
  Filled 2011-03-10: qty 3

## 2011-03-10 MED ORDER — SODIUM CHLORIDE 0.9 % IJ SOLN
3.0000 mL | Freq: Two times a day (BID) | INTRAMUSCULAR | Status: DC
  Administered 2011-03-11 – 2011-03-12 (×2): 3 mL via INTRAVENOUS

## 2011-03-10 MED ORDER — FENTANYL CITRATE 0.05 MG/ML IJ SOLN
INTRAMUSCULAR | Status: AC
  Filled 2011-03-10: qty 2

## 2011-03-10 NOTE — Progress Notes (Signed)
PHARMACY - ANTICOAGULATION CONSULT INITIAL NOTE  Pharmacy Consult for: Heparin  Indication: chest pain/ACS    Patient Data:   Allergies: No Known Allergies  Patient Measurements: Height: 5\' 7"  (170.2 cm) Weight: 180 lb (81.647 kg) IBW/kg (Calculated) : 61.6  Heparin dosing weight: 78 kg   Vital Signs: Temp:  [98.1 F (36.7 C)] 98.1 F (36.7 C) (01/13 2352) Pulse Rate:  [66-78] 73  (01/14 0115) Resp:  [0-26] 21  (01/14 0115) BP: (122-151)/(63-75) 122/75 mmHg (01/14 0115) SpO2:  [98 %-100 %] 100 % (01/14 0203) Weight:  [180 lb (81.647 kg)] 180 lb (81.647 kg) (01/14 0056)  Intake/Output from previous day: No intake or output data in the 24 hours ending 03/10/11 0312  Labs:  Basename 03/09/11 2349  HGB 12.0  HCT 37.1  PLT 167  APTT --  LABPROT --  INR --  HEPARINUNFRC --  CREATININE 0.99  CKTOTAL --  CKMB --  TROPONINI --   Estimated Creatinine Clearance: 58.1 ml/min (by C-G formula based on Cr of 0.99).  Medical History: Past Medical History  Diagnosis Date  . Stroke   . Hypertension   . Hypercholesteremia     Scheduled medications:     . sodium chloride   Intravenous STAT  . aspirin EC  81 mg Oral Daily  . aspirin  325 mg Oral STAT  . cefTRIAXone (ROCEPHIN)  IV  1 g Intravenous Once  . clopidogrel  75 mg Oral Daily  . heparin  4,000 Units Intravenous Once  . hydrochlorothiazide  25 mg Oral Daily  . insulin aspart  0-9 Units Subcutaneous TID WC  . insulin aspart protamine-insulin aspart  40 Units Subcutaneous BID WC  . nitroGLYCERIN  0.4 mg Transdermal Daily  . pantoprazole  40 mg Oral Q1200  . rosuvastatin  40 mg Oral q1800  . sodium chloride  3 mL Intravenous Q12H  . DISCONTD: aspirin  324 mg Oral Once  . DISCONTD: heparin  14 Units/kg/hr Intravenous Once  . DISCONTD: insulin aspart protamine-insulin aspart  40 Units Subcutaneous BID  . DISCONTD: nitroGLYCERIN  2-200 mcg/min Intravenous Once     Assessment:  71 y.o. female admitted on  03/09/2011, with chest pain was started on IV heparin in ED. Pharmacy to manage heparin. Heparin is currently infusing at 1150 units/hr.   Goal of Therapy:  1. Heparin level 0.3-0.7 units/ml  Plan:  1. Decrease IV heparin to 950 units/hr 2. Heparin level in 8 hours.  3.   Daily CBC, heparin level.  Dineen Kid Kingjames Coury, PharmD 03/10/2011, 3:12 AM

## 2011-03-10 NOTE — Progress Notes (Signed)
CRITICAL VALUE ALERT  Critical value received:  MB and Troponin  Date of notification:  t  Time of notification:  n  Critical value read back:yes  Nurse who received alert:  Jyl Heinz RN  MD notified (1st page):  Dr. Jacinto Halim  Time of first page:  [phone]  MD notified (2nd page):  Time of second page:  Responding MD:  Dr. Jacinto Halim  Time MD responded:  [phone]

## 2011-03-10 NOTE — Progress Notes (Signed)
Inpatient Diabetes Program Recommendations  AACE/ADA: New Consensus Statement on Inpatient Glycemic Control (2009)  Target Ranges:  Prepandial:   less than 140 mg/dL      Peak postprandial:   less than 180 mg/dL (1-2 hours)      Critically ill patients:  140 - 180 mg/dL   Results for [name] [name], [name] [name] (MRN [id number]) as of 03/10/2011 10:34  Ref. Range 03/09/2011 23:49 03/10/2011 02:52 03/10/2011 05:32 03/10/2011 07:39  Glucose-Capillary Latest Range: 70-99 mg/dL  784 (H)  696 (H)  Glucose Latest Range: 70-99 mg/dL 295 (H)  284 (H)     Inpatient Diabetes Program Recommendations Insulin - Basal: Add NPH portion of 70/30 while patient NPO:  28 units of NPH BID or Lantus 45 units daily

## 2011-03-10 NOTE — ED Notes (Signed)
EMERGENCY CONTACT IS Huel Cote; SHARON BOGGS @ [phone].  Please notify when pt is moved to a room

## 2011-03-10 NOTE — Progress Notes (Signed)
Site area: right groin  Site Prior to Removal:  Level 0  Pressure Applied For 20 MINUTES    Minutes Beginning at 2002  Manual:   yes  Patient Status During Pull:  Tolerated well  Post Pull Groin Site:  Level 0  Post Pull Instructions Given:  yes  Post Pull Pulses Present:  yes, DP palpable but weak 1+  Dressing Applied:  yes  Comments:  See freq vs

## 2011-03-10 NOTE — Brief Op Note (Addendum)
03/09/2011 - 03/10/2011  6:12 PM  PATIENT:  [name] [name]  71 y.o. female  PRE-OPERATIVE DIAGNOSIS:  chest pain, NTEMI  POST-OPERATIVE DIAGNOSIS:  CAD, progression of CAD with SVG disease and native LAD disease.  Medical therapy due to severe tortuosity of LIMA. Unable to pass the guide wire across the lesion. Procedure abandoned. Medical therapy for CAD. If stable and no further cardiac enzyme leak, potential discharge tomorrow.  PROCEDURE:  Procedure(s):  LEFT HEART CATHETERIZATION WITH CORONARY ANGIOGRAM, LV, SVG and LIMA graft.  SURGEON:  Surgeon(s): Pamella Pert   DICTATION: .Other Dictation: Dictation Number [id number]

## 2011-03-10 NOTE — Progress Notes (Signed)
PHARMACY - ANTICOAGULATION CONSULT INITIAL NOTE  Pharmacy Consult for: Heparin  Indication: chest pain/ACS    Patient Data:   Allergies: No Known Allergies  Patient Measurements: Height: 5\' 7"  (170.2 cm) Weight: 175 lb 0.7 oz (79.4 kg) IBW/kg (Calculated) : 61.6  Heparin dosing weight: 78 kg   Vital Signs: Temp:  [98 F (36.7 C)-98.4 F (36.9 C)] 98.3 F (36.8 C) (01/14 1215) Pulse Rate:  [52-78] 52  (01/14 0700) Resp:  [0-26] 16  (01/14 0700) BP: (111-151)/(47-86) 137/49 mmHg (01/14 0700) SpO2:  [98 %-100 %] 100 % (01/14 0800) Weight:  [175 lb 0.7 oz (79.4 kg)-180 lb (81.647 kg)] 175 lb 0.7 oz (79.4 kg) (01/14 0500)  Intake/Output from previous day:  Intake/Output Summary (Last 24 hours) at 03/10/11 1236 Last data filed at 03/10/11 0900  Gross per 24 hour  Intake  443.5 ml  Output    475 ml  Net  -31.5 ml    Labs:  Basename 03/10/11 1130 03/10/11 1120 03/10/11 0532 03/10/11 0523 03/09/11 2349  HGB -- -- 11.1* -- 12.0  HCT -- -- 35.5* -- 37.1  PLT -- -- 166 -- 167  APTT -- -- -- -- --  LABPROT 14.3 -- -- -- --  INR 1.09 -- -- -- --  HEPARINUNFRC 0.29* -- -- -- --  CREATININE -- -- 0.85 -- 0.99  CKTOTAL -- PENDING -- 232* --  CKMB -- 19.3* -- 16.8* --  TROPONINI -- 4.19* -- 2.23* --   Estimated Creatinine Clearance: 66.8 ml/min (by C-G formula based on Cr of 0.85).  Medical History: Past Medical History  Diagnosis Date  . Stroke   . Hypertension   . Hypercholesteremia     Scheduled medications:     . sodium chloride   Intravenous STAT  . aspirin EC  81 mg Oral Daily  . aspirin  325 mg Oral STAT  . cefTRIAXone (ROCEPHIN)  IV  1 g Intravenous Once  . cefTRIAXone (ROCEPHIN)  IV  1 g Intravenous Daily  . clopidogrel  75 mg Oral Daily  . famotidine  40 mg Oral QAC breakfast  . heparin  4,000 Units Intravenous Once  . insulin aspart  0-20 Units Subcutaneous Q4H  . insulin NPH  10 Units Subcutaneous Once  . nitroGLYCERIN  0.4 mg Transdermal Daily    . rosuvastatin  40 mg Oral q1800  . sodium chloride  3 mL Intravenous Q12H  . sodium chloride  3 mL Intravenous Q12H  . DISCONTD: aspirin  324 mg Oral Once  . DISCONTD: heparin  14 Units/kg/hr Intravenous Once  . DISCONTD: hydrochlorothiazide  25 mg Oral Daily  . DISCONTD: insulin aspart  0-9 Units Subcutaneous TID WC  . DISCONTD: insulin aspart protamine-insulin aspart  40 Units Subcutaneous BID  . DISCONTD: insulin aspart protamine-insulin aspart  40 Units Subcutaneous BID WC  . DISCONTD: nitroGLYCERIN  2-200 mcg/min Intravenous Once  . DISCONTD: pantoprazole  40 mg Oral Q1200     Assessment:  71 y.o. female admitted on 03/09/2011, with chest pain was started on IV heparin in ED. Pharmacy to manage heparin. Heparin level 0.29 on 950 units/hr (9.5 ml/hr).   Pharmacy Problem List/Assessment:  Anticoagulation: Heparin level 0.29 on 950 units/hr (9.5 ml/hr). CBC ok, INR 1.09, no bleeding reported.  Infectious Disease: Rocephin 1g daily for UTI, urine culture pending, wbc wnl, afebrile.  Neuro: Multiple co morbidities- communicates mostly by writing.  Pulm:  100% on 2L [place]  Card: CP/NSTEMI- plan for cardiac cath and possible  PTCA today; home plavix and statin resumed.  FEN/GI/Endo: NPO, history of DM- on resistant SSI + 10 units NPH (DM Coordinator recs incr NPH to 28) Renal: SCr 0.85/estCrCl~29ml/min, lytes ok, UOP~0.5cc/kg/hr-no renal dose adjustment needed  Best practices: IV heparin PTA medications addressed.    Goal of Therapy:  1. Heparin level 0.3-0.7 units/ml  Plan:  1. Increase heparin to 1000 units/hr (29ml/hr) 2. Follow-up post cardiac cath.  3.   Daily CBC, heparin level.  Link Snuffer, PharmD, BCPS 03/10/2011, 12:36 PM

## 2011-03-10 NOTE — H&P (Signed)
[name] [name] is an 71 y.o. female.   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 Patient states she had chest pain on the left side anterior chest wall nonradiating with no associated shortness of breath nausea vomiting or diaphoresis. Patient called her daughter who called EMS and patient was brought here. Her chest x-ray and EKG did not reveal anything acute but troponin is mildly positive. Patient's cardiologist Dr. Jacinto [name] was consulted by ER physician. Dr. Jacinto [name] advised hospital admission and he will be seeing patient in consult. Patient denies any nausea vomiting abdominal pain dysuria discharges or diarrhea.  Past Medical History  Diagnosis Date  . Stroke   . Hypertension   . Hypercholesteremia     Past Surgical History  Procedure Date  . Coronary artery bypass graft     History reviewed. No pertinent family history. Social History:  reports that she has never smoked. She does not have any smokeless tobacco history on file. She reports that she does not drink alcohol or use illicit drugs.  Allergies: No Known Allergies  Medications Prior to Admission  Medication Dose Route Frequency Provider Last Rate Last Dose  . 0.9 %  sodium chloride infusion   Intravenous STAT [name] Jakes, MD      . aspirin tablet 325 mg  325 mg Oral STAT [name] Jakes, MD   325 mg at 03/10/11 0034  . cefTRIAXone (ROCEPHIN) 1 g in dextrose 5 % 50 mL IVPB  1 g Intravenous Once [name] Jakes, MD      . heparin ADULT infusion 100 units/ml (25000 units/250 ml)  14 Units/kg/hr Intravenous Continuous [name] [name], PHARMD      . heparin bolus via infusion 4,000 Units  4,000 Units Intravenous Once [name] Jakes, MD      . nitroGLYCERIN (NITROSTAT)  SL tablet 0.4 mg  0.4 mg Sublingual Q5 min PRN [name] Jakes, MD   0.4 mg at 03/10/11 0055  . nitroGLYCERIN 0.2 mg/mL in dextrose 5 % infusion  2-200 mcg/min Intravenous Once [name] Jakes, MD      . DISCONTD: aspirin chewable tablet 324 mg  324 mg Oral Once [name] Jakes, MD      . DISCONTD: heparin ADULT infusion 100 units/mL (25000 units/250 mL)  14 Units/kg/hr Intravenous Once [name] Jakes, MD       Medications Prior to Admission  Medication Sig Dispense Refill  . clopidogrel (PLAVIX) 75 MG tablet Take 75 mg by mouth daily.        . hydrochlorothiazide (HYDRODIURIL) 25 MG tablet Take 25 mg by mouth daily.        . insulin aspart protamine-insulin aspart (NOVOLOG 70/30) (70-30) 100 UNIT/ML injection Inject 40 Units into the skin 2 (two) times daily.       . rosuvastatin (CRESTOR) 40 MG tablet Take 40 mg by mouth at bedtime.          Results for orders placed during the hospital encounter of 03/09/11 (from the past 48 hour(s))  CBC     Status: Normal   Collection Time   03/09/11 11:49 PM      Component Value Range Comment   WBC 7.3  4.0 - 10.5 (K/uL)    RBC 4.43  3.87 - 5.11 (MIL/uL)  Hemoglobin 12.0  12.0 - 15.0 (g/dL)    HCT 16.1  09.6 - 04.5 (%)    MCV 83.7  78.0 - 100.0 (fL)    MCH 27.1  26.0 - 34.0 (pg)    MCHC 32.3  30.0 - 36.0 (g/dL)    RDW 40.9  81.1 - 91.4 (%)    Platelets 167  150 - 400 (K/uL)   BASIC METABOLIC PANEL     Status: Abnormal   Collection Time   03/09/11 11:49 PM      Component Value Range Comment   Sodium 139  135 - 145 (mEq/L)    Potassium 3.9  3.5 - 5.1 (mEq/L)    Chloride 105  96 - 112 (mEq/L)    CO2 26  19 - 32 (mEq/L)    Glucose, Bld 255 (*) 70 - 99 (mg/dL)    BUN 18  6 - 23 (mg/dL)    Creatinine, Ser 7.82  0.50 - 1.10 (mg/dL)    Calcium 9.4  8.4 - 10.5 (mg/dL)    GFR calc non Af Amer 56 (*) >90 (mL/min)    GFR calc Af Amer 65 (*) >90 (mL/min)   PRO B NATRIURETIC PEPTIDE     Status: Abnormal   Collection Time   03/09/11  11:49 PM      Component Value Range Comment   Pro B Natriuretic peptide (BNP) 185.3 (*) 0 - 125 (pg/mL)   POCT I-STAT TROPONIN I     Status: Abnormal   Collection Time   03/10/11 12:09 AM      Component Value Range Comment   Troponin i, poc 0.45 (*) 0.00 - 0.08 (ng/mL)    Comment NOTIFIED PHYSICIAN      Comment 3            URINALYSIS, ROUTINE W REFLEX MICROSCOPIC     Status: Abnormal   Collection Time   03/10/11  1:10 AM      Component Value Range Comment   Color, Urine YELLOW  YELLOW     APPearance CLOUDY (*) CLEAR     Specific Gravity, Urine 1.016  1.005 - 1.030     pH 7.0  5.0 - 8.0     Glucose, UA 250 (*) NEGATIVE (mg/dL)    Hgb urine dipstick MODERATE (*) NEGATIVE     Bilirubin Urine NEGATIVE  NEGATIVE     Ketones, ur NEGATIVE  NEGATIVE (mg/dL)    Protein, ur NEGATIVE  NEGATIVE (mg/dL)    Urobilinogen, UA 1.0  0.0 - 1.0 (mg/dL)    Nitrite NEGATIVE  NEGATIVE     Leukocytes, UA LARGE (*) NEGATIVE    URINE MICROSCOPIC-ADD ON     Status: Abnormal   Collection Time   03/10/11  1:10 AM      Component Value Range Comment   Squamous Epithelial / LPF RARE  RARE     WBC, UA 21-50  <3 (WBC/hpf)    RBC / HPF 3-6  <3 (RBC/hpf)    Bacteria, UA MANY (*) RARE    POCT I-STAT TROPONIN I     Status: Abnormal   Collection Time   03/10/11  1:36 AM      Component Value Range Comment   Troponin i, poc 0.49 (*) 0.00 - 0.08 (ng/mL)    Comment NOTIFIED PHYSICIAN      Comment 3             Chest Portable 1 View  03/10/2011  *RADIOLOGY REPORT*  Clinical Data: Chest pain,  shortness of breath  PORTABLE CHEST - 1 VIEW  Comparison: 02/20/2011  Findings: Decreased lung volumes with basilar vascular congestion versus early edema.  No effusion or pneumothorax.  Trachea midline. Previous coronary bypass changes evident.  IMPRESSION: Low volume exam with basilar vascular congestion versus early edema.  Original Report Authenticated By: [name] [name], M.D.    Review of Systems  Constitutional: Negative.    HENT: Negative.   Eyes: Negative.   Respiratory: Negative.   Cardiovascular: Positive for chest pain.  Gastrointestinal: Negative.   Genitourinary: Negative.   Musculoskeletal: Negative.   Skin: Negative.   Neurological: Negative.   Endo/Heme/Allergies: Negative.   Psychiatric/Behavioral: Negative.     Blood pressure 122/75, pulse 73, temperature 98.1 F (36.7 C), temperature source Oral, resp. rate 21, height 5\' 7"  (1.702 m), weight 81.647 kg (180 lb), SpO2 100.00%. Physical Exam  Constitutional: She is oriented to person, place, and time. She appears well-developed and well-nourished. No distress.  HENT:  Head: Normocephalic and atraumatic.  Right Ear: External ear normal.  Left Ear: External ear normal.  Nose: Nose normal.  Mouth/Throat: Oropharynx is clear and moist. No oropharyngeal exudate.  Eyes: Conjunctivae and EOM are normal. Pupils are equal, round, and reactive to light. Right eye exhibits no discharge. Left eye exhibits no discharge. No scleral icterus.  Neck: Normal range of motion. Neck supple.  Cardiovascular: Normal rate, regular rhythm and normal heart sounds.   Respiratory: Effort normal and breath sounds normal. No respiratory distress. She has no wheezes.  GI: Soft. Bowel sounds are normal. She exhibits no distension. There is no tenderness. There is no rebound.  Musculoskeletal: Normal range of motion. She exhibits no edema and no tenderness.  Neurological: She is alert and oriented to person, place, and time.       Has difficulty talking and right sided hemiparesis.  Skin: Skin is warm and dry. No rash noted. She is not diaphoretic. No erythema.  Psychiatric: Her behavior is normal.     Assessment/Plan #1. Chest pain with positive troponin and history of CAD status post CABG - presently patient is chest pain-free. ER physician as already start patient on IV heparin which we will continue. We will place patient on nitroglycerin patch. Aspirin 81 mg with  Plavix. Follow cardiology consult. #2. UTI - patient has been started on ceftriaxone which we will continue and follow urine cultures. #3. History of diabetes mellitus type 2, hypertension, history of CVA, history of hyperlipidemia - continue present medications.  CODE STATUS - full code.  Anneliese Leblond N. 03/10/2011, 2:45 AM

## 2011-03-10 NOTE — Consults (Signed)
CARDIOLOGY CONSULT NOTE  Patient ID: [name] [name] MRN: [id number] DOB/AGE: February 06, 1941 71 y.o.  Admit date: 03/09/2011 Referring Physician Ralene Ok, [facility]   Primary Physician:  Ralene Ok, MD Reason for ConsultationNSTEMI  HPI: Patient presenting with chest pain that started yesterday afternoon. Patient in spite of multiple comorbidities is a very charming and very intelligent.  Patient monitors her medications symptoms very well.  She has not had any more further chest pains.  Her appearance although looks ill, she is relatively active for her comorbidities and very intelligent.  She is a retired Runner, broadcasting/film/video.  She speaks with a grunting voice.  She mostly communicates by writing. She communicated with her daughter regarding chest pain who then brought her to the ED.  I had seen her on 06/07/10: Patient presents  for  evaluation and treatment of CAD and chest pain.   No further symptoms since last visit 4 weeks earlier and is feeling well on medical therapy.   Now recurrence of Chest pain is described as heavyness.   It is located in the middle of the chest region.   Chest discomfort lasts 10-15 minutes.   It does not radiate to the arm or back.   Frequency is about 1 to 2 per week.   The patient has no associated shortness of breath    Past Medical History  Diagnosis Date  . Stroke   . Hypertension   . Hypercholesteremia      Past Surgical History  Procedure Date  . Coronary artery bypass graft      History reviewed. No pertinent family history.  Social History: History   Social History  . Marital Status: Widowed    Spouse Name: N/A    Number of Children: N/A  . Years of Education: N/A   Occupational History  . Not on file.   Social History Main Topics  . Smoking status: Never Smoker   . Smokeless tobacco: Not on file  . Alcohol Use: No  . Drug Use: No  . Sexually Active:    Other Topics Concern  . Not on file   Social History Narrative  . No narrative on file      Prescriptions prior to admission  Medication Sig Dispense Refill  . clopidogrel (PLAVIX) 75 MG tablet Take 75 mg by mouth daily.        Marland Kitchen esomeprazole (NEXIUM) 40 MG capsule Take 40 mg by mouth daily before breakfast.      . hydrochlorothiazide (HYDRODIURIL) 25 MG tablet Take 25 mg by mouth daily.        . insulin aspart protamine-insulin aspart (NOVOLOG 70/30) (70-30) 100 UNIT/ML injection Inject 40 Units into the skin 2 (two) times daily.       . rosuvastatin (CRESTOR) 40 MG tablet Take 40 mg by mouth at bedtime.          ROS: General: no fevers/chills/night sweats Eyes: no blurry vision, diplopia, or amaurosis ENT: no sore throat or hearing loss Resp: no cough, wheezing, or hemoptysis CV: no edema or palpitations GI: no abdominal pain, nausea, vomiting, diarrhea, or constipation GU: no dysuria, frequency, or hematuria Skin: no rash Neuro: no headache, numbness, tingling, or weakness of extremities Musculoskeletal: no joint pain or swelling Heme: no bleeding, DVT, or easy bruising Endo: no polydipsia or polyuria    Physical Exam: Blood pressure 137/49, pulse 52, temperature 98 F (36.7 C), temperature source Oral, resp. rate 16, height 5\' 7"  (1.702 m), weight 79.4 kg (175 lb 0.7 oz), SpO2  100.00%.  Pt is alert and oriented, WD, WN, in no distress. HEENT: normal Neck: JVP normal. Carotid upstrokes normal without bruits. No thyromegaly. Lungs: equal expansion, clear bilaterally CV: Apex is discrete and nondisplaced, RRR without murmur or gallop Abd: soft, NT, +BS, no bruit, no hepatosplenomegaly Back: no CVA tenderness Ext: no C/C/E        Femoral pulses 2+= without bruits        DP/PT pulses intact and = Skin: warm and dry without rash Neuro: Alert O x 3. Intact with full range of motion in left arm and leg.   hemiplegic right.   Labs:   Lab Results  Component Value Date   WBC 8.4 03/10/2011   HGB 11.1* 03/10/2011   HCT 35.5* 03/10/2011   MCV 83.5 03/10/2011   PLT 166  03/10/2011    Lab 03/10/11 0532  NA 139  K 4.0  CL 107  CO2 25  BUN 18  CREATININE 0.85  CALCIUM 9.3  PROT 6.4  BILITOT 0.2*  ALKPHOS 58  ALT 14  AST 29  GLUCOSE 228*   Lab Results  Component Value Date   CKTOTAL 232* 03/10/2011   CKMB 16.8* 03/10/2011   TROPONINI 2.23* 03/10/2011    Lab Results  Component Value Date   CHOL 191 03/10/2011   Lab Results  Component Value Date   HDL 44 03/10/2011   Lab Results  Component Value Date   LDLCALC 137* 03/10/2011   Lab Results  Component Value Date   TRIG 52 03/10/2011   Lab Results  Component Value Date   CHOLHDL 4.3 03/10/2011   No results found for this basename: LDLDIRECT      Radiology: Chest Portable 1 View  03/10/2011  *RADIOLOGY REPORT*  Clinical Data: Chest pain, shortness of breath  PORTABLE CHEST - 1 VIEW  Comparison: 02/20/2011  Findings: Decreased lung volumes with basilar vascular congestion versus early edema.  No effusion or pneumothorax.  Trachea midline. Previous coronary bypass changes evident.  IMPRESSION: Low volume exam with basilar vascular congestion versus early edema.  Original Report Authenticated By: Judie Petit. Ruel Favors, M.D.    EKG: NSR, LVH. NO ST-T changes of ischemia.  ASSESSMENT AND PLAN:   1. NSTEMI. Nuclear Stress 05/17/10 Small sized mod to severe basal inferolateral ischemia. Non sp. ST dep with lexiscan infusion. 2. CAD s/p CABG in [place] in 2001 and ? stent in 2001 after CABG. 3. Diabetes Mellitus II controlled.  4. Carotid bruit faint left > right. 5. Right sided hemiplegia and dysarthria.   Stroke in 2002 and 2003.  Plan: Proceed with cardiac cath and possible PTCA today.  Schedule for cardiac catheterization. We had previously discussed regarding risks, benefits, alternatives to this including stress testing, CTA and continued medical therapy. Patient wants to proceed. Understands <1-2% risk of death, stroke, MI, urgent CABG, bleeding, infection, renal failure but not limited to these.  As medical therapy has failed, will proceed with cardiac cath pending consent from her daughter. Left message.     Pamella Pert, MD 03/10/2011, 9:34 AM

## 2011-03-10 NOTE — Progress Notes (Signed)
Patient had episode of midsternal chest pain 5 of 10 on pain scale, received total of 2 SL Nitroglycerin tablets SL with complete relief after second tablet, Dr. Jacinto Halim notified , no new orders, patient is for cardiac cath this afternoon, Berle Mull RN

## 2011-03-10 NOTE — Progress Notes (Signed)
Rt groin prepped per order, pt has been NPO for cardiac cath lab, now ready for patient in the cath lab , will be transported via bed to cath lab on monitor, Berle Mull RN

## 2011-03-10 NOTE — ED Provider Notes (Addendum)
History     CSN: [id number]  Arrival date & time 03/09/11  2335   First MD Initiated Contact with Patient 03/09/11 2351      Chief Complaint  Patient presents with  . Chest Pain  . Urinary Tract Infection    (Consider location/radiation/quality/duration/timing/severity/associated sxs/prior treatment) The history is provided by the patient and a relative.   Level V caveat applies due to patient's marked A. aphasia following a stroke in the past. As we can tell onset of chest pain sometime on Sunday and also the hospital and urine and dysuria. Patient is followed by cardiology and a local primary care MD. Past Medical History  Diagnosis Date  . Stroke   . Hypertension   . Hypercholesteremia     Past Surgical History  Procedure Date  . Coronary artery bypass graft     No family history on file.  History  Substance Use Topics  . Smoking status: Not on file  . Smokeless tobacco: Not on file  . Alcohol Use: No    OB History    Grav Para Term Preterm Abortions TAB SAB Ect Mult Living                  Review of Systems  Unable to perform ROS  unable to obtain specific review of systems due to patient's significant aphasia.  Allergies  Review of patient's allergies indicates no known allergies.  Home Medications   Current Outpatient Rx  Name Route Sig Dispense Refill  . CLOPIDOGREL BISULFATE 75 MG PO TABS Oral Take 75 mg by mouth daily.      Marland Kitchen ESOMEPRAZOLE MAGNESIUM 40 MG PO CPDR Oral Take 40 mg by mouth daily before breakfast.    . HYDROCHLOROTHIAZIDE 25 MG PO TABS Oral Take 25 mg by mouth daily.      . INSULIN ASPART PROT & ASPART (70-30) 100 UNIT/ML Adams SUSP Subcutaneous Inject 40 Units into the skin 2 (two) times daily.     Marland Kitchen ROSUVASTATIN CALCIUM 40 MG PO TABS Oral Take 40 mg by mouth at bedtime.        BP 122/75  Pulse 73  Temp(Src) 98.1 F (36.7 C) (Oral)  Resp 21  Ht 5\' 7"  (1.702 m)  Wt 180 lb (81.647 kg)  BMI 28.19 kg/m2  SpO2 100%  Physical  Exam  Constitutional: She is oriented to person, place, and time. She appears well-developed and well-nourished. No distress.  HENT:  Head: Normocephalic and atraumatic.  Mouth/Throat: Oropharynx is clear and moist.  Eyes: Conjunctivae and EOM are normal. Pupils are equal, round, and reactive to light.  Neck: Normal range of motion. Neck supple.  Cardiovascular: Normal rate, regular rhythm, normal heart sounds and intact distal pulses.   No murmur heard. Pulmonary/Chest: Effort normal and breath sounds normal. She has no rales.  Abdominal: Soft. Bowel sounds are normal. There is no tenderness.  Musculoskeletal: Normal range of motion.  Neurological: She is alert and oriented to person, place, and time. No cranial nerve deficit. She exhibits normal muscle tone. Coordination normal.  Skin: Skin is warm. No rash noted.    ED Course  Procedures (including critical care time)  Labs Reviewed  BASIC METABOLIC PANEL - Abnormal; Notable for the following:    Glucose, Bld 255 (*)    GFR calc non Af Amer 56 (*)    GFR calc Af Amer 65 (*)    All other components within normal limits  PRO B NATRIURETIC PEPTIDE - Abnormal; Notable for  the following:    Pro B Natriuretic peptide (BNP) 185.3 (*)    All other components within normal limits  POCT I-STAT TROPONIN I - Abnormal; Notable for the following:    Troponin i, poc 0.45 (*)    All other components within normal limits  URINALYSIS, ROUTINE W REFLEX MICROSCOPIC - Abnormal; Notable for the following:    APPearance CLOUDY (*)    Glucose, UA 250 (*)    Hgb urine dipstick MODERATE (*)    Leukocytes, UA LARGE (*)    All other components within normal limits  URINE MICROSCOPIC-ADD ON - Abnormal; Notable for the following:    Bacteria, UA MANY (*)    All other components within normal limits  POCT I-STAT TROPONIN I - Abnormal; Notable for the following:    Troponin i, poc 0.49 (*)    All other components within normal limits  CBC  POCT  CARDIAC MARKERS  I-STAT TROPONIN I   Chest Portable 1 View  03/10/2011  *RADIOLOGY REPORT*  Clinical Data: Chest pain, shortness of breath  PORTABLE CHEST - 1 VIEW  Comparison: 02/20/2011  Findings: Decreased lung volumes with basilar vascular congestion versus early edema.  No effusion or pneumothorax.  Trachea midline. Previous coronary bypass changes evident.  IMPRESSION: Low volume exam with basilar vascular congestion versus early edema.  Original Report Authenticated By: Judie Petit. Ruel Favors, M.D.    Date: 03/10/2011  Rate: 71  Rhythm: normal sinus rhythm  QRS Axis: normal  Intervals: normal  ST/T Wave abnormalities: nonspecific ST/T changes  Conduction Disutrbances:none  Narrative Interpretation:   Old EKG Reviewed: unchanged No significant change in EKG from 06/12/2008  Results for orders placed during the hospital encounter of 03/09/11  CBC      Component Value Range   WBC 7.3  4.0 - 10.5 (K/uL)   RBC 4.43  3.87 - 5.11 (MIL/uL)   Hemoglobin 12.0  12.0 - 15.0 (g/dL)   HCT 16.1  09.6 - 04.5 (%)   MCV 83.7  78.0 - 100.0 (fL)   MCH 27.1  26.0 - 34.0 (pg)   MCHC 32.3  30.0 - 36.0 (g/dL)   RDW 40.9  81.1 - 91.4 (%)   Platelets 167  150 - 400 (K/uL)  BASIC METABOLIC PANEL      Component Value Range   Sodium 139  135 - 145 (mEq/L)   Potassium 3.9  3.5 - 5.1 (mEq/L)   Chloride 105  96 - 112 (mEq/L)   CO2 26  19 - 32 (mEq/L)   Glucose, Bld 255 (*) 70 - 99 (mg/dL)   BUN 18  6 - 23 (mg/dL)   Creatinine, Ser 7.82  0.50 - 1.10 (mg/dL)   Calcium 9.4  8.4 - 95.6 (mg/dL)   GFR calc non Af Amer 56 (*) >90 (mL/min)   GFR calc Af Amer 65 (*) >90 (mL/min)  PRO B NATRIURETIC PEPTIDE      Component Value Range   Pro B Natriuretic peptide (BNP) 185.3 (*) 0 - 125 (pg/mL)  POCT I-STAT TROPONIN I      Component Value Range   Troponin i, poc 0.45 (*) 0.00 - 0.08 (ng/mL)   Comment NOTIFIED PHYSICIAN     Comment 3           URINALYSIS, ROUTINE W REFLEX MICROSCOPIC      Component Value Range     Color, Urine YELLOW  YELLOW    APPearance CLOUDY (*) CLEAR    Specific Gravity, Urine 1.016  1.005 - 1.030    pH 7.0  5.0 - 8.0    Glucose, UA 250 (*) NEGATIVE (mg/dL)   Hgb urine dipstick MODERATE (*) NEGATIVE    Bilirubin Urine NEGATIVE  NEGATIVE    Ketones, ur NEGATIVE  NEGATIVE (mg/dL)   Protein, ur NEGATIVE  NEGATIVE (mg/dL)   Urobilinogen, UA 1.0  0.0 - 1.0 (mg/dL)   Nitrite NEGATIVE  NEGATIVE    Leukocytes, UA LARGE (*) NEGATIVE   URINE MICROSCOPIC-ADD ON      Component Value Range   Squamous Epithelial / LPF RARE  RARE    WBC, UA 21-50  <3 (WBC/hpf)   RBC / HPF 3-6  <3 (RBC/hpf)   Bacteria, UA MANY (*) RARE   POCT I-STAT TROPONIN I      Component Value Range   Troponin i, poc 0.49 (*) 0.00 - 0.08 (ng/mL)   Comment NOTIFIED PHYSICIAN     Comment 3              1. MI, acute, non ST segment elevation   2. Urinary tract infection     CRITICAL CARE Performed by: Shelda Jakes.   Total critical care time: 30  Critical care time was exclusive of separately billable procedures and treating other patients.  Critical care was necessary to treat or prevent imminent or life-threatening deterioration.  Critical care was time spent personally by me on the following activities: development of treatment plan with patient and/or surrogate as well as nursing, discussions with consultants, evaluation of patient's response to treatment, examination of patient, obtaining history from patient or surrogate, ordering and performing treatments and interventions, ordering and review of laboratory studies, ordering and review of radiographic studies, pulse oximetry and re-evaluation of patient's condition.   MDM   Patient status post stroke in the past with severe aphasia patient had been in rehabilitation center but now is back at home taking care of herself her daughter noted that tissue complaint of chest pain onset on Sunday also complaint of foul-smelling urine and burning with  urination.  Workup in the emergency department found to significant findings. First troponin mildly elevated at 0.45. Pain improved in the emergency department with sublingual nitroglycerin came down to 1/10 chest x-ray showed mild pulmonary edema. EKG without any acute ST segment changes compared to EKG in April making this a non-STEMI MI possibility. Repeat troponin came back at 0.49. Patient's chest pain started to increase so we'll start IV nitroglycerin drip. Will initiate heparin IV and bolus. Discussed with her cardiologist Dr.Ghanji. who will consult on the patient. According to cardiology patient not a candidate to go to Cath Lab right away. Also patient given aspirin in the emergency department, 325 mg. patient prior stroke was a nonbleeding had a stroke patient has been on Plavix.  Patient also with marked urinary tract infection based on UA treated with 1 g of Rocephin for this urine culture sent blood sugar also elevated at 255 past history shows no history of diabetes. Discussed with [facility] hospitalist who will admit the patient to step down with cardiology consult.         Shelda Jakes, MD 03/10/11 [id number]  Shelda Jakes, MD 03/12/11 1228

## 2011-03-11 ENCOUNTER — Other Ambulatory Visit: Payer: Self-pay

## 2011-03-11 LAB — CBC
HCT: 32.5 % — ABNORMAL LOW (ref 36.0–46.0)
Hemoglobin: 10.3 g/dL — ABNORMAL LOW (ref 12.0–15.0)
MCH: 26.3 pg (ref 26.0–34.0)
MCHC: 31.7 g/dL (ref 30.0–36.0)

## 2011-03-11 LAB — CARDIAC PANEL(CRET KIN+CKTOT+MB+TROPI): CK, MB: 6.5 ng/mL (ref 0.3–4.0)

## 2011-03-11 LAB — GLUCOSE, CAPILLARY
Glucose-Capillary: 181 mg/dL — ABNORMAL HIGH (ref 70–99)
Glucose-Capillary: 175 mg/dL — ABNORMAL HIGH (ref 70–99)
Glucose-Capillary: 150 mg/dL — ABNORMAL HIGH (ref 70–99)

## 2011-03-11 LAB — BASIC METABOLIC PANEL
BUN: 11 mg/dL (ref 6–23)
CO2: 25 mEq/L (ref 19–32)
Calcium: 8.7 mg/dL (ref 8.4–10.5)
Creatinine, Ser: 0.77 mg/dL (ref 0.50–1.10)
Glucose, Bld: 142 mg/dL — ABNORMAL HIGH (ref 70–99)

## 2011-03-11 LAB — AMYLASE: Amylase: 93 U/L (ref 0–105)

## 2011-03-11 MED ORDER — FENTANYL CITRATE 0.05 MG/ML IJ SOLN: 50.0000 ug | Freq: Once | INTRAMUSCULAR | Status: DC

## 2011-03-11 MED FILL — Dextrose Inj 5%: INTRAVENOUS | Qty: 50 | Status: AC

## 2011-03-11 NOTE — Consult Note (Signed)
Reason for Consult:Chest pain Referring Physician: [facility]  [place] HPI: This is 71 year old female who is familiar to me.  I recently saw her in the office two weeks ago for her chest pain.  She was started on Nexium at that time and she reported that her chest pain was improving with the medication.  The plan was to reassess her situation on 03/11/2010, but she was admitted to the hospital.  Further evaluation was attempted by Dr. Jacinto Halim, but she was identified to have diffuse CAD.  He was not able stent because of the tortuosity of her blood vessels and medical management was recommended.  She reports having the pain after PO intake and the plan was to perform an EGD if she did not have a significant improvement in her symptoms at the time of follow up.  Past Medical History  Diagnosis Date  . Stroke   . Hypertension   . Hypercholesteremia     Past Surgical History  Procedure Date  . Coronary artery bypass graft     History reviewed. No pertinent family history.  Social History:  reports that she has never smoked. She does not have any smokeless tobacco history on file. She reports that she does not drink alcohol or use illicit drugs.  Allergies: No Known Allergies  Medications:  Scheduled:   . sodium chloride   Intravenous STAT  . aspirin EC  81 mg Oral Daily  . bivalirudin      . cefTRIAXone (ROCEPHIN)  IV  1 g Intravenous Daily  . clopidogrel  75 mg Oral Daily  . enoxaparin  40 mg Subcutaneous Q24H  . famotidine  40 mg Oral QAC breakfast  . fentaNYL      . fentaNYL  50 mcg Intravenous Once  . heparin      . insulin aspart  0-20 Units Subcutaneous Q4H  . lidocaine      . midazolam      . nitroGLYCERIN  0.4 mg Transdermal Daily  . nitroGLYCERIN      . rosuvastatin  40 mg Oral q1800  . sodium chloride  3 mL Intravenous Q12H  . sodium chloride  3 mL Intravenous Q12H  . DISCONTD: clopidogrel  75 mg Oral Q breakfast   Continuous:   . sodium chloride 125  mL/hr at 03/10/11 1839  . DISCONTD: heparin 950 Units/hr (03/10/11 0330)    Results for orders placed during the hospital encounter of 03/09/11 (from the past 24 hour(s))  POCT ACTIVATED CLOTTING TIME     Status: Normal   Collection Time   03/10/11  5:45 PM      Component Value Range   Activated Clotting Time 375    CARDIAC PANEL(CRET KIN+CKTOT+MB+TROPI)     Status: Abnormal   Collection Time   03/10/11  7:10 PM      Component Value Range   Total CK 245 (*) 7 - 177 (U/L)   CK, MB 13.7 (*) 0.3 - 4.0 (ng/mL)   Troponin I 6.33 (*) <0.30 (ng/mL)   Relative Index 5.6 (*) 0.0 - 2.5   GLUCOSE, CAPILLARY     Status: Abnormal   Collection Time   03/10/11  7:43 PM      Component Value Range   Glucose-Capillary 110 (*) 70 - 99 (mg/dL)  GLUCOSE, CAPILLARY     Status: Abnormal   Collection Time   03/11/11  4:05 AM      Component Value Range   Glucose-Capillary 181 (*) 70 -  99 (mg/dL)   Comment 1 Documented in Chart     Comment 2 Notify RN    GLUCOSE, CAPILLARY     Status: Abnormal   Collection Time   03/11/11  6:45 AM      Component Value Range   Glucose-Capillary 175 (*) 70 - 99 (mg/dL)  GLUCOSE, CAPILLARY     Status: Abnormal   Collection Time   03/11/11  8:21 AM      Component Value Range   Glucose-Capillary 150 (*) 70 - 99 (mg/dL)  CBC     Status: Abnormal   Collection Time   03/11/11  8:34 AM      Component Value Range   WBC 6.5  4.0 - 10.5 (K/uL)   RBC 3.91  3.87 - 5.11 (MIL/uL)   Hemoglobin 10.3 (*) 12.0 - 15.0 (g/dL)   HCT 40.9 (*) 81.1 - 46.0 (%)   MCV 83.1  78.0 - 100.0 (fL)   MCH 26.3  26.0 - 34.0 (pg)   MCHC 31.7  30.0 - 36.0 (g/dL)   RDW 91.4  78.2 - 95.6 (%)   Platelets 148 (*) 150 - 400 (K/uL)  BASIC METABOLIC PANEL     Status: Abnormal   Collection Time   03/11/11  8:34 AM      Component Value Range   Sodium 141  135 - 145 (mEq/L)   Potassium 3.8  3.5 - 5.1 (mEq/L)   Chloride 108  96 - 112 (mEq/L)   CO2 25  19 - 32 (mEq/L)   Glucose, Bld 142 (*) 70 - 99 (mg/dL)    BUN 11  6 - 23 (mg/dL)   Creatinine, Ser 2.13  0.50 - 1.10 (mg/dL)   Calcium 8.7  8.4 - 08.6 (mg/dL)   GFR calc non Af Amer 83 (*) >90 (mL/min)   GFR calc Af Amer >90  >90 (mL/min)  CARDIAC PANEL(CRET KIN+CKTOT+MB+TROPI)     Status: Abnormal   Collection Time   03/11/11 11:00 AM      Component Value Range   Total CK 180 (*) 7 - 177 (U/L)   CK, MB 6.5 (*) 0.3 - 4.0 (ng/mL)   Troponin I 4.80 (*) <0.30 (ng/mL)   Relative Index 3.6 (*) 0.0 - 2.5      Chest Portable 1 View  03/10/2011  *RADIOLOGY REPORT*  Clinical Data: Chest pain, shortness of breath  PORTABLE CHEST - 1 VIEW  Comparison: 02/20/2011  Findings: Decreased lung volumes with basilar vascular congestion versus early edema.  No effusion or pneumothorax.  Trachea midline. Previous coronary bypass changes evident.  IMPRESSION: Low volume exam with basilar vascular congestion versus early edema.  Original Report Authenticated By: Judie Petit. Ruel Favors, M.D.    ROS:  As stated above in the HPI otherwise negative.  Blood pressure 105/50, pulse 71, temperature 99.1 F (37.3 C), temperature source Oral, resp. rate 20, height 5\' 7"  (1.702 m), weight 82.7 kg (182 lb 5.1 oz), SpO2 97.00%.    PE: Gen: NAD, Alert and Oriented HEENT:  [place]/AT, EOMI Neck: Supple, no LAD Lungs: CTA Bilaterally CV: RRR without M/G/R ABM: Soft, NTND, +BS Ext: No C/C/E  Assessment/Plan: 1) Chest pain - ? GI versus Cardiac. 2) CVA with expressive aphasia. 3) HTN. 4) DM.   With the persistence of her symptoms I will schedule her for an EGD for further evaluation.  Plan: 1) EGD tomorrow. Neftali Abair D 03/11/2011, 12:45 PM

## 2011-03-11 NOTE — Progress Notes (Addendum)
Subjective:  Patient complains of severe epigastric discomfort that started early this morning. She also complains soft radiation to the chest and states that at times it goes across her left arm. Patient states that this has been good on for the past 2 weeks and does not let up. She did not have any problems last night after the catheterization. She's extremely concerned and does not want any medications but want to find out why she is hurting in her epigastrium. She denies any dark stools, bloody stools.  Objective:  Vital Signs in the last 24 hours: Temp:  [98.3 F (36.8 C)-99.6 F (37.6 C)] 99.1 F (37.3 C) (01/15 0700) Pulse Rate:  [56-86] 71  (01/15 0700) Resp:  [3-22] 20  (01/15 0700) BP: (102-151)/(44-87) 105/50 mmHg (01/15 0700) SpO2:  [97 %-100 %] 97 % (01/15 0700) Weight:  [82.7 kg (182 lb 5.1 oz)] 82.7 kg (182 lb 5.1 oz) (01/15 0407)  Intake/Output from previous day: 01/14 0701 - 01/15 0700 In: 1467 [P.O.:200; I.V.:1217; IV Piggyback:50] Out: 1275 [Urine:1275]  Physical Exam:   General appearance: alert and cooperative Eyes: conjunctivae/corneas clear. PERRL, EOM's intact. Fundi benign. Neck: no adenopathy, no carotid bruit, no JVD, supple, symmetrical, trachea midline and thyroid not enlarged, symmetric, no tenderness/mass/nodules Neck: JVP - normal, carotids 2+= without bruits Resp: clear to auscultation bilaterally Chest wall: no tenderness Cardio: regular rate and rhythm, S1, S2 normal, no murmur, click, rub or gallop GI: Epigastric tenderness present. Right groin at arterial access without hematoma. BS positive in all 4 quadrants. Extremities: Right hemiplegia present    Lab Results:  Basename 03/11/11 0834 03/10/11 0532  WBC 6.5 8.4  HGB 10.3* 11.1*  PLT 148* 166    Basename 03/11/11 0834 03/10/11 0532  NA 141 139  K 3.8 4.0  CL 108 107  CO2 25 25  GLUCOSE 142* 228*  BUN 11 18  CREATININE 0.77 0.85    Basename 03/10/11 1910 03/10/11 1120    TROPONINI 6.33* 4.19*   Hepatic Function Panel  Basename 03/10/11 0532  PROT 6.4  ALBUMIN 2.9*  AST 29  ALT 14  ALKPHOS 58  BILITOT 0.2*  BILIDIR --  IBILI --    Basename 03/10/11 0523  CHOL 191   No results found for this basename: PROTIME in the last 72 hours  EKG:  Sinus rhythm, left ventricle hypertrophy with repolarization abnormality. Very subtle ST segment depression in the appreciated in V5 to V6 compared to yesterday's EKG.  Cardiac Studies:  Assessment/Plan:  1. Non-ST elevation myocardial infarction. I have reviewed the sine angiograms that was done yesterday with Dr. Excell Seltzer. Do not think there is any urgency in proceeding with intervention to the LAD. There was mild dissection noted in the LIMA after the attempted antiplastic relatively however there was excellent brisk TIMI-3 flow. The LIMA itself is severely tortuous and can potentially lead to significant complications hence probably vision to be left alone. Only she continues to have significant chest discomfort and CK enzymes or markedly elevated would I consider sending with repeat catheterization. 2. Epigastric discomfort. Just been extremely difficult to evaluate in a patient who has stroke but patient is extremely intelligent. States that chest pain and abdominal epigastric pain has been going on for the past 2 weeks. She states that this is no different from 2 weeks ago. Her epigastrium is tender to touch but bowel sounds are heard in all 4 quadrants. Right groin site has healed well without any hematoma. Her hemoglobin has remained stable.  Suspect either acute gastritis of severe GERD as an etiology for her chest pain. Plan:  I will continue to cycle enzymes from cardiac standpoint. We may need to get GI input. At this point empirically I will start her on Integrilin for 24 hours.   Pamella Pert, M.D. 03/11/2011, 12:09 PM   I received her CPK and MB. There is no further enzyme leak and hence will obtain  amylase and lipase. Doubt NSTEMi progression. Will cancel integrillin orders.

## 2011-03-11 NOTE — Progress Notes (Signed)
PT Note  Order received.  Pt needs shoes and AFO to amb.  Called daughter who will bring these items in tonight.  Will evaluate in AM.  Thanks.  [employer] PT [phone]

## 2011-03-11 NOTE — Progress Notes (Addendum)
Subjective: Laying in bed awake and alert and in no distress. She writes that she has abdominal pain after she eats and also complains of heart burn and a 5 lb weight loss. She takes a daily Nexium and had an EGD 2 yrs ago. We have figured out that this was done by Dr Hung. She is not aware of the results of the EGD.   Objective: Blood pressure 105/50, pulse 71, temperature 99.1 F (37.3 C), temperature source Oral, resp. rate 20, height 5' 7" (1.702 m), weight 82.7 kg (182 lb 5.1 oz), SpO2 97.00%. Weight change: 1.053 kg (2 lb 5.1 oz)  Intake/Output Summary (Last 24 hours) at 03/11/11 1231 Last data filed at 03/11/11 0400  Gross per 24 hour  Intake 1219.5 ml  Output   1025 ml  Net  194.5 ml    Physical Exam: General appearance: alert and cooperative Head: Normocephalic, without obvious abnormality, atraumatic Throat: lips, mucosa, and tongue normal; teeth and gums normal Lungs: clear to auscultation bilaterally Heart: regular rate and rhythm, S1, S2 normal, no murmur, click, rub or gallop Abdomen: soft,tender in epigastrium; bowel sounds normal; no masses,  no organomegaly Extremities: extremities normal, atraumatic, no cyanosis or edema  Lab Results:  Basename 03/11/11 0834 03/10/11 0532  NA 141 139  K 3.8 4.0  CL 108 107  CO2 25 25  GLUCOSE 142* 228*  BUN 11 18  CREATININE 0.77 0.85  CALCIUM 8.7 9.3  MG -- --  PHOS -- --    Basename 03/10/11 0532  AST 29  ALT 14  ALKPHOS 58  BILITOT 0.2*  PROT 6.4  ALBUMIN 2.9*   No results found for this basename: LIPASE:2,AMYLASE:2 in the last 72 hours  Basename 03/11/11 0834 03/10/11 0532  WBC 6.5 8.4  NEUTROABS -- --  HGB 10.3* 11.1*  HCT 32.5* 35.5*  MCV 83.1 83.5  PLT 148* 166    Basename 03/11/11 1100 03/10/11 1910 03/10/11 1120  CKTOTAL 180* 245* 267*  CKMB 6.5* 13.7* 19.3*  CKMBINDEX -- -- --  TROPONINI 4.80* 6.33* 4.19*   No components found with this basename: POCBNP:3 No results found for this basename:  DDIMER:2 in the last 72 hours  Basename 03/10/11 1130 03/10/11 0532  HGBA1C 7.7* 7.7*    Basename 03/10/11 0523  CHOL 191  HDL 44  LDLCALC 137*  TRIG 52  CHOLHDL 4.3  LDLDIRECT --    Basename 03/10/11 0532  TSH 1.971  T4TOTAL --  T3FREE --  THYROIDAB --   No results found for this basename: VITAMINB12:2,FOLATE:2,FERRITIN:2,TIBC:2,IRON:2,RETICCTPCT:2 in the last 72 hours  Micro Results: Recent Results (from the past 240 hour(s))  URINE CULTURE     Status: Normal (Preliminary result)   Collection Time   03/10/11  2:57 AM      Component Value Range Status Comment   Specimen Description URINE, CATHETERIZED   Final    Special Requests NONE   Final    Setup Time [id number]   Final    Colony Count >=100,000 COLONIES/ML   Final    Culture PROTEUS MIRABILIS   Final    Report Status PENDING   Incomplete   MRSA PCR SCREENING     Status: Normal   Collection Time   03/10/11  2:57 AM      Component Value Range Status Comment   MRSA by PCR NEGATIVE  NEGATIVE  Final     Studies/Results:  Chest Portable 1 View  03/10/2011  *RADIOLOGY REPORT*  Clinical Data: Chest pain, shortness   of breath  PORTABLE CHEST - 1 VIEW  Comparison: 02/20/2011  Findings: Decreased lung volumes with basilar vascular congestion versus early edema.  No effusion or pneumothorax.  Trachea midline. Previous coronary bypass changes evident.  IMPRESSION: Low volume exam with basilar vascular congestion versus early edema.  Original Report Authenticated By: M. TREVOR SHICK, M.D.    Medications: Scheduled Meds:   . sodium chloride   Intravenous STAT  . aspirin EC  81 mg Oral Daily  . bivalirudin      . cefTRIAXone (ROCEPHIN)  IV  1 g Intravenous Daily  . clopidogrel  75 mg Oral Daily  . enoxaparin  40 mg Subcutaneous Q24H  . famotidine  40 mg Oral QAC breakfast  . fentaNYL      . fentaNYL  50 mcg Intravenous Once  . heparin      . insulin aspart  0-20 Units Subcutaneous Q4H  . lidocaine      . midazolam       . nitroGLYCERIN  0.4 mg Transdermal Daily  . nitroGLYCERIN      . rosuvastatin  40 mg Oral q1800  . sodium chloride  3 mL Intravenous Q12H  . sodium chloride  3 mL Intravenous Q12H  . DISCONTD: clopidogrel  75 mg Oral Q breakfast   Continuous Infusions:   . sodium chloride 125 mL/hr at 03/10/11 1839  . DISCONTD: heparin 950 Units/hr (03/10/11 0330)   PRN Meds:.sodium chloride, acetaminophen, acetaminophen, acetaminophen, alum & mag hydroxide-simeth, nitroGLYCERIN, ondansetron (ZOFRAN) IV, ondansetron, sodium chloride, DISCONTD: ondansetron (ZOFRAN) IV  Assessment/Plan: Principal Problem:  *Chest pain- has CAD on Cath which needs to be managed medically- see Dr Ganji's notes. We will transfer her out of step down.   Epigastric pain and tenderness- PUD? Spoke w/ Dr Hung who will come by and evaluate her. Shje is on daily Nexium and Pepcid   HTN (hypertension)- BP low. Hold HCTZ.    Diabetes mellitus- sugars stable on current regimen  UTI, proteus- cont rocephin and f/u cultures.    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   CAD (coronary artery disease), native coronary artery  Hx of CABG  S/P PTCA (percutaneous transluminal coronary angioplasty)   LOS: 2 days   Danis Pembleton [phone] 03/11/2011, 12:31 PM  

## 2011-03-11 NOTE — Progress Notes (Signed)
After sheath pull, pt assisted w/ HOB up 45 degrees to allow safe swallowing due to CVA residual effects.  Pt c/o severe chest pain, pointing to midsternal and epigastric area.  No change after first NTG, placed on 4L O2 per [place].  Pt refused 2nd NTG, states (in writing) she thinks something is wrong with her digestive system because she gets this pain every time she eats for past week.  Assisted w/ HOB up 60 while closely monitoring groin site.  Maalox given PO.  Relief obtained after belching within 20 minutes of Maalox.

## 2011-03-11 NOTE — Cardiovascular Report (Signed)
[name] [name], [name] [name] NO.:  [id number]  MEDICAL RECORD NO.:  [id number]  LOCATION:  2507                         FACILITY:  [facility]  PHYSICIAN:  [name] Pert, MD DATE OF BIRTH:  Mar 06, 1940  DATE OF PROCEDURE:  03/10/2011 DATE OF DISCHARGE:                           CARDIAC CATHETERIZATION   REFERRING PHYSICIAN:  Ralene Ok, MD and [facility].  PROCEDURE PERFORMED:  Left heart catheterization including; 1. Left ventriculography. 2. Selective right and left coronary arteriography. 3. Saphenous vein graft and left internal mammary arteriography. 4. Attempted percutaneous transluminal coronary angioplasty of the     native left anterior descending via tortuous left internal mammary     artery graft.  INDICATION:  [name] [name] is a pleasant 71 year old female with history of known coronary artery disease, status post coronary artery bypass grafting in [place] in 2001.  Following coronary artery bypass grafting, she had acute coronary syndrome, in which she underwent stenting in 2001.  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  She has had 2 strokes in 2002 and 2003.  She has had a previously abnormal stress test for chest pain but because she had improved with medical therapy, we had continued medical therapy only. However she was readmitted to [facility] on March 09, 2011, with unstable angina and non-ST-elevation myocardial infarction. Because of this, she is now brought to the cardiac catheterization lab to evaluate her coronary anatomy.  HEMODYNAMIC DATA:  The left ventricular pressure was 116/3 with end- diastolic pressure of 11 mmHg.  Aortic pressure was 112/55 with a mean of 76 mmHg.  There was no pressure gradient across the aortic valve.  ANGIOGRAPHIC DATA:  Left ventricle.  Left ventricle systolic function was preserved with ejection fraction of 55%.  There is dyskinesis at the basal  inferior wall.  There was no significant mitral regurgitation.  Right coronary artery.  Right coronary artery has a superior origin. There is a old stent noted in the right coronary artery.  This stent is occluded.  There are collaterals at the distal right coronary artery supplied by collaterals.  Diffuse disease is evident in the distal right coronary artery.  There did not appear to be any significant targets to revascularize the occluded right coronary artery stent that was placed in 2001.  Left main coronary artery.  Left main coronary artery is a moderate caliber vessel, severe diffuse disease and is essentially occluded in the distal segment.  Faint TIMI 1 filling is evident in a ramus intermediate branch.  LIMA to LAD.  LIMA to LAD is widely patent.  It is severely tortuous. The LIMA itself retrogradely fills all the way up to the proximal segment.  Proximal to  the insertion of the LIMA graft, the native LAD itself has severe 80-90% stenosis, still there is TIMI-3 flow evident in the native LAD retrograde filling.  The mid to distal segment of the LAD after the insertion of the LIMA graft has a focal 80% stenoses.  SVG to RCA.  SVG to RCA is occluded.  SVG to OM-1.  SVG to OM-1 is occluded.  SVG to OM-2.  SVG to OM 2  is widely patent.  It retrogradely fills the distal circumflex coronary artery.  The native circumflex itself has diffuse disease.  INTERVENTION DATA:  Unsuccessful attempt trying to angioplasty the LIMA to LAD.  In spite of using Asahi Prowater wire, I was unable to cross the acute tortuosity of the left internal mammary artery.  In spite of utilization of a 2.5 x12 Emerge balloon, I had great difficulty in advancing the balloon.  Hence with the fear of causing dissection in the native LIMA, the system was withdrawn out of the body.  At the end of the procedure, there was mild haziness noted in the LIMA itself, however there was brisk TIMI-3 flow was  evident.  The patient remained hemodynamically stable without any EKG changes.  Hence the lesion was left alone and it was decided to proceed with medical therapy only at this point.  A total of 120 mL of contrast was utilized for diagnostic angiography and intervention procedure.  TECHNIQUE OF THE PROCEDURE:  Under sterile precautions using a 5-French right femoral arterial access, a 5-French multipurpose A2 catheter was advanced to the ascending aorta and then into the left ventricle.  Left ventriculography was performed in the RAO projection.  The same catheter was pulled into the ascending aorta and saphenous vein graft angiography was performed.  The right coronary artery and the right coronary graft could not be cannulated, hence those vessels were cannulated using the 5- French LIMA catheter.  The left internal mammary artery also was calculated using a 5-French LIMA catheter without any difficulty.  The catheter exchange was done over the J-wire.  Native left main coronary artery was un-cannulated using a 5-French Judkins left 4 diagnostic catheter.  TECHNIQUE OF INTERVENTION:  Using Angiomax for anticoagulation maintaining ACT greater than 200, I utilized a 6-French LIMA catheter to engage the left internal mammary artery.  No difficulty was encountered engaging the left internal mammary artery.  I utilized an Clinical cytogeneticist guidewire to advance into the left internal mammary artery.  I was able to advance for about 3/4 way into the LIMA.  However had significant difficulty in advancing any further because of acute angle turn of the LIMA.  I utilized a backup balloon.  In spite of this, the balloon would not traverse even halfway through the left internal mammary artery. Hence at this point, I felt that the success of getting a stent down into the LAD would be extremely low and felt that this was probably unsafe to proceed with further PCI to the native LAD.  Hence all  the systems were withdrawn, angiography was repeated.  The guide catheter disengaged and pulled out of the body.  The patient tolerated the procedure bed.  No immediate complications were noted.     [name] Pert, MD     JRG/MEDQ  D:  03/10/2011  T:  03/11/2011  Job:  [id number]  cc:   [name] [name], M.D.

## 2011-03-11 NOTE — Progress Notes (Signed)
Cardiac Rehab [phone] Pt states she needs her brace and shoes to walk. She does not have those here now. Would recommend PT consult also .Notified pt's RN that those things needed for pt to walk. Rn going to ask pt's daughter to bring.Merridy Pascoe DunlapRN

## 2011-03-12 ENCOUNTER — Encounter (HOSPITAL_COMMUNITY)
Admission: EM | Disposition: A | Discharge status: Home / Self Care | Payer: Self-pay | Source: Home / Self Care | Attending: Internal Medicine

## 2011-03-12 ENCOUNTER — Encounter (HOSPITAL_COMMUNITY): Payer: Self-pay | Admitting: *Deleted

## 2011-03-12 HISTORY — PX: ESOPHAGOGASTRODUODENOSCOPY: SHX5428

## 2011-03-12 LAB — CBC
Hemoglobin: 11.7 g/dL — ABNORMAL LOW (ref 12.0–15.0)
MCV: 82.5 fL (ref 78.0–100.0)
Platelets: 173 10*3/uL (ref 150–400)
RBC: 4.39 MIL/uL (ref 3.87–5.11)
WBC: 8 10*3/uL (ref 4.0–10.5)

## 2011-03-12 LAB — GLUCOSE, CAPILLARY
Glucose-Capillary: 136 mg/dL — ABNORMAL HIGH (ref 70–99)
Glucose-Capillary: 147 mg/dL — ABNORMAL HIGH (ref 70–99)
Glucose-Capillary: 239 mg/dL — ABNORMAL HIGH (ref 70–99)
Glucose-Capillary: 326 mg/dL — ABNORMAL HIGH (ref 70–99)

## 2011-03-12 SURGERY — EGD (ESOPHAGOGASTRODUODENOSCOPY)
Anesthesia: Moderate Sedation

## 2011-03-12 MED ORDER — MIDAZOLAM HCL 10 MG/2ML IJ SOLN
INTRAMUSCULAR | Status: DC | PRN
  Administered 2011-03-12 (×2): 1 mg via INTRAVENOUS

## 2011-03-12 MED ORDER — LEVOFLOXACIN 750 MG PO TABS
750.0000 mg | ORAL_TABLET | Freq: Every day | ORAL | Status: DC
  Administered 2011-03-12 – 2011-03-13 (×2): 750 mg via ORAL
  Filled 2011-03-12 (×2): qty 1

## 2011-03-12 MED ORDER — SODIUM CHLORIDE 0.9 % IV SOLN
Freq: Once | INTRAVENOUS | Status: AC
  Administered 2011-03-12: 500 mL via INTRAVENOUS

## 2011-03-12 MED ORDER — BUTAMBEN-TETRACAINE-BENZOCAINE 2-2-14 % EX AERO
INHALATION_SPRAY | CUTANEOUS | Status: DC | PRN
  Administered 2011-03-12: 2 via TOPICAL

## 2011-03-12 MED ORDER — FENTANYL CITRATE 0.05 MG/ML IJ SOLN
INTRAMUSCULAR | Status: AC
  Filled 2011-03-12: qty 2

## 2011-03-12 MED ORDER — SUCRALFATE 1 GM/10ML PO SUSP
1.0000 g | Freq: Three times a day (TID) | ORAL | Status: DC
  Administered 2011-03-12 – 2011-03-13 (×5): 1 g via ORAL
  Filled 2011-03-12 (×8): qty 10

## 2011-03-12 MED ORDER — AMLODIPINE BESYLATE 5 MG PO TABS
5.0000 mg | ORAL_TABLET | Freq: Every day | ORAL | Status: DC
  Administered 2011-03-12 – 2011-03-13 (×2): 5 mg via ORAL
  Filled 2011-03-12 (×2): qty 1

## 2011-03-12 MED ORDER — MIDAZOLAM HCL 10 MG/2ML IJ SOLN
INTRAMUSCULAR | Status: AC
  Filled 2011-03-12: qty 2

## 2011-03-12 MED ORDER — FENTANYL NICU IV SYRINGE 50 MCG/ML
INJECTION | INTRAMUSCULAR | Status: DC | PRN
  Administered 2011-03-12 (×2): 12.5 ug via INTRAVENOUS

## 2011-03-12 MED ORDER — PANTOPRAZOLE SODIUM 40 MG PO TBEC
40.0000 mg | DELAYED_RELEASE_TABLET | Freq: Two times a day (BID) | ORAL | Status: DC
  Administered 2011-03-12 – 2011-03-13 (×2): 40 mg via ORAL
  Filled 2011-03-12 (×2): qty 1

## 2011-03-12 NOTE — Op Note (Signed)
Eligha Bridegroom [facility] [address] [place], [place]  [address]  OPERATIVE PROCEDURE REPORT  PATIENT:  [name] [name], [name] [name]  MR#:  [id number] BIRTHDATE:  [birth year]  GENDER:  female ENDOSCOPIST:  Jeani Hawking, MD PROCEDURE DATE:  03/12/2011 PROCEDURE:  EGD, diagnostic [phone] ASA CLASS:  Class III INDICATIONS:  Chest pain MEDICATIONS:  Fentanyl 25 mcg IV, Versed 3 mg IV  DESCRIPTION OF PROCEDURE:   After the risks benefits and alternatives of the procedure were thoroughly explained, informed consent was obtained.  The Pentax Gastroscope [id number] endoscope was introduced through the mouth and advanced to the second portion of the duodenum, without limitations.  The instrument was slowly withdrawn as the mucosa was fully examined. <<PROCEDUREIMAGES>>  FINDINGS: A 4 cm hiatal hernia was identified. No evidence of any inflammation, ulcerations, erosions, polyps, masses, or vascular abnormalities.    Retroflexed views revealed no abnormalities. The scope was then withdrawn from the patient and the procedure terminated.  COMPLICATIONS:  None  IMPRESSION:  1) Hiatal hernia RECOMMENDATIONS:  1) PPI BID. 2) Trial of sucralfate.  ______________________________ Jeani Hawking, MD  n. Rosalie DoctorJeani Hawking at 03/12/2011 08:48 AM  Joycie Peek, [phone]

## 2011-03-12 NOTE — Progress Notes (Signed)
   CARE MANAGEMENT NOTE 03/12/2011  Patient:  [name] [name],[name] [name]   Account Number:  [id number]  Date Initiated:  03/12/2011  Documentation initiated by:  Onnie Boer  Subjective/Objective Assessment:   PT WAS ADMITTED WITH CP AND POS TROP     Action/Plan:   PROGRESSION OF CARE AND DISCHARGE PLANNING   Anticipated DC Date:     Anticipated DC Plan:  HOME/SELF CARE  In-house referral  Clinical Social Worker      DC Planning Services  CM consult      Choice offered to / List presented to:             Status of service:  In process, will continue to follow Medicare Important Message given?   (If response is "NO", the following Medicare IM given date fields will be blank) Date Medicare IM given:   Date Additional Medicare IM given:    Discharge Disposition:    Per UR Regulation:  Reviewed for med. necessity/level of care/duration of stay  Comments:  UR COMPLETED 03/12/2011 Onnie Boer, RN, BSN PT WAS ADMITTED WITH CP AND HAS POS TROPONINS.  PT IS TO HAVE EGD TODAY. WILL F/U ON DC PLANS

## 2011-03-12 NOTE — Interval H&P Note (Signed)
History and Physical Interval Note:  03/12/2011 8:50 AM  [name] [name]  has presented today for surgery, with the diagnosis of Chest pain  The various methods of treatment have been discussed with the patient and family. After consideration of risks, benefits and other options for treatment, the patient has consented to  Procedure(s): ESOPHAGOGASTRODUODENOSCOPY (EGD) as a surgical intervention .  The patients' history has been reviewed, patient examined, no change in status, stable for surgery.  I have reviewed the patients' chart and labs.  Questions were answered to the patient's satisfaction.     Tamala Manzer D

## 2011-03-12 NOTE — Progress Notes (Signed)
PT Cancellation Note  Treatment cancelled today due to pt declined PT x2 today secondary to stomach feeling upset and hurting.  Will try another time.    [name] [name], [place] [phone] 03/12/2011, 3:13 PM

## 2011-03-12 NOTE — H&P (View-Only) (Signed)
Subjective: Laying in bed awake and alert and in no distress. She writes that she has abdominal pain after she eats and also complains of heart burn and a 5 lb weight loss. She takes a daily Nexium and had an EGD 2 yrs ago. We have figured out that this was done by Dr Elnoria Howard. She is not aware of the results of the EGD.   Objective: Blood pressure 105/50, pulse 71, temperature 99.1 F (37.3 C), temperature source Oral, resp. rate 20, height 5\' 7"  (1.702 m), weight 82.7 kg (182 lb 5.1 oz), SpO2 97.00%. Weight change: 1.053 kg (2 lb 5.1 oz)  Intake/Output Summary (Last 24 hours) at 03/11/11 1231 Last data filed at 03/11/11 0400  Gross per 24 hour  Intake 1219.5 ml  Output   1025 ml  Net  194.5 ml    Physical Exam: General appearance: alert and cooperative Head: Normocephalic, without obvious abnormality, atraumatic Throat: lips, mucosa, and tongue normal; teeth and gums normal Lungs: clear to auscultation bilaterally Heart: regular rate and rhythm, S1, S2 normal, no murmur, click, rub or gallop Abdomen: soft,tender in epigastrium; bowel sounds normal; no masses,  no organomegaly Extremities: extremities normal, atraumatic, no cyanosis or edema  Lab Results:  [facility] 03/11/11 0834 03/10/11 0532  NA 141 139  K 3.8 4.0  CL 108 107  CO2 25 25  GLUCOSE 142* 228*  BUN 11 18  CREATININE 0.77 0.85  CALCIUM 8.7 9.3  MG -- --  PHOS -- --    Basename 03/10/11 0532  AST 29  ALT 14  ALKPHOS 58  BILITOT 0.2*  PROT 6.4  ALBUMIN 2.9*   No results found for this basename: LIPASE:2,AMYLASE:2 in the last 72 hours  Basename 03/11/11 0834 03/10/11 0532  WBC 6.5 8.4  NEUTROABS -- --  HGB 10.3* 11.1*  HCT 32.5* 35.5*  MCV 83.1 83.5  PLT 148* 166    Basename 03/11/11 1100 03/10/11 1910 03/10/11 1120  CKTOTAL 180* 245* 267*  CKMB 6.5* 13.7* 19.3*  CKMBINDEX -- -- --  TROPONINI 4.80* 6.33* 4.19*   No components found with this basename: POCBNP:3 No results found for this basename:  DDIMER:2 in the last 72 hours  Basename 03/10/11 1130 03/10/11 0532  HGBA1C 7.7* 7.7*    Basename 03/10/11 0523  CHOL 191  HDL 44  LDLCALC 137*  TRIG 52  CHOLHDL 4.3  LDLDIRECT --    Basename 03/10/11 0532  TSH 1.971  T4TOTAL --  T3FREE --  THYROIDAB --   No results found for this basename: VITAMINB12:2,FOLATE:2,FERRITIN:2,TIBC:2,IRON:2,RETICCTPCT:2 in the last 72 hours  Micro Results: Recent Results (from the past 240 hour(s))  URINE CULTURE     Status: Normal (Preliminary result)   Collection Time   03/10/11  2:57 AM      Component Value Range Status Comment   Specimen Description URINE, CATHETERIZED   Final    Special Requests NONE   Final    Setup Time [id number]   Final    Colony Count >=100,000 COLONIES/ML   Final    Culture PROTEUS MIRABILIS   Final    Report Status PENDING   Incomplete   MRSA PCR SCREENING     Status: Normal   Collection Time   03/10/11  2:57 AM      Component Value Range Status Comment   MRSA by PCR NEGATIVE  NEGATIVE  Final     Studies/Results:  Chest Portable 1 View  03/10/2011  *RADIOLOGY REPORT*  Clinical Data: Chest pain, shortness  of breath  PORTABLE CHEST - 1 VIEW  Comparison: 02/20/2011  Findings: Decreased lung volumes with basilar vascular congestion versus early edema.  No effusion or pneumothorax.  Trachea midline. Previous coronary bypass changes evident.  IMPRESSION: Low volume exam with basilar vascular congestion versus early edema.  Original Report Authenticated By: Judie Petit. Ruel Favors, M.D.    Medications: Scheduled Meds:   . sodium chloride   Intravenous STAT  . aspirin EC  81 mg Oral Daily  . bivalirudin      . cefTRIAXone (ROCEPHIN)  IV  1 g Intravenous Daily  . clopidogrel  75 mg Oral Daily  . enoxaparin  40 mg Subcutaneous Q24H  . famotidine  40 mg Oral QAC breakfast  . fentaNYL      . fentaNYL  50 mcg Intravenous Once  . heparin      . insulin aspart  0-20 Units Subcutaneous Q4H  . lidocaine      . midazolam       . nitroGLYCERIN  0.4 mg Transdermal Daily  . nitroGLYCERIN      . rosuvastatin  40 mg Oral q1800  . sodium chloride  3 mL Intravenous Q12H  . sodium chloride  3 mL Intravenous Q12H  . DISCONTD: clopidogrel  75 mg Oral Q breakfast   Continuous Infusions:   . sodium chloride 125 mL/hr at 03/10/11 1839  . DISCONTD: heparin 950 Units/hr (03/10/11 0330)   PRN Meds:.sodium chloride, acetaminophen, acetaminophen, acetaminophen, alum & mag hydroxide-simeth, nitroGLYCERIN, ondansetron (ZOFRAN) IV, ondansetron, sodium chloride, DISCONTD: ondansetron (ZOFRAN) IV  Assessment/Plan: Principal Problem:  *Chest pain- has CAD on Cath which needs to be managed medically- see Dr Verl Dicker notes. We will transfer her out of step down.   Epigastric pain and tenderness- PUD? Spoke w/ Dr Elnoria Howard who will come by and evaluate her. Shje is on daily Nexium and Pepcid   HTN (hypertension)- BP low. Hold HCTZ.    Diabetes mellitus- sugars stable on current regimen  UTI, proteus- cont rocephin and f/u cultures.    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   CAD (coronary artery disease), native coronary artery  Hx of CABG  S/P PTCA (percutaneous transluminal coronary angioplasty)   LOS: 2 days   [facility] [phone] 03/11/2011, 12:31 PM

## 2011-03-12 NOTE — Progress Notes (Signed)
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 Patient states she had chest pain on the left side anterior chest wall nonradiating with no associated shortness of breath nausea vomiting or diaphoresis. Patient called her daughter who called EMS and patient was brought here. Her chest x-ray and EKG did not reveal anything acute but troponin was mildly positive.  [name] [name] indicates to me via writing that her epigastric pain is "intolerable."  Of note, she was asleep when I first entered the room.  She inquires about the possibility of having surgery to correct her hiatal hernia.  She denies sob, n/v, f/c, or ha.  Objective: Weight change:   Intake/Output Summary (Last 24 hours) at 03/12/11 1610 Last data filed at 03/12/11 1300  Gross per 24 hour  Intake    700 ml  Output   3700 ml  Net  -3000 ml   Blood pressure 130/73, pulse 76, temperature 99.5 F (37.5 C), temperature source Oral, resp. rate 21, height 5\' 7"  (1.702 m), weight 83.9 kg (184 lb 15.5 oz), SpO2 97.00%.  Physical Exam: General: No acute respiratory distress Lungs: Clear to auscultation bilaterally without wheezes or crackles Cardiovascular: Regular rate and rhythm without murmur gallop or rub normal S1 and S2 Abdomen: some mild tenderness to palpation in the epigastrium, nondistended, soft, bowel sounds positive, no rebound, no ascites, no appreciable mass Extremities: No significant cyanosis, clubbing, or edema bilateral lower extremities  Lab Results:  [facility] 03/11/11 0834 03/10/11 0532 03/09/11 2349  NA 141 139 139  K 3.8 4.0 3.9  CL 108 107 105  CO2 25 25 26   GLUCOSE 142* 228* 255*  BUN 11 18 18   CREATININE 0.77 0.85 0.99  CALCIUM 8.7 9.3 9.4  MG -- -- --  PHOS -- -- --    Basename  03/10/11 0532  AST 29  ALT 14  ALKPHOS 58  BILITOT 0.2*  PROT 6.4  ALBUMIN 2.9*    Basename 03/12/11 0507 03/11/11 0834 03/10/11 0532  WBC 8.0 6.5 8.4  NEUTROABS -- -- --  HGB 11.7* 10.3* 11.1*  HCT 36.2 32.5* 35.5*  MCV 82.5 83.1 83.5  PLT 173 148* 166    Basename 03/11/11 1100 03/10/11 1910 03/10/11 1120  CKTOTAL 180* 245* 267*  CKMB 6.5* 13.7* 19.3*  CKMBINDEX -- -- --  TROPONINI 4.80* 6.33* 4.19*    Basename 03/10/11 1130 03/10/11 0532  HGBA1C 7.7* 7.7*    Micro Results: Recent Results (from the past 240 hour(s))  URINE CULTURE     Status: Normal   Collection Time   03/10/11  2:57 AM      Component Value Range Status Comment   Specimen Description URINE, CATHETERIZED   Final    Special Requests NONE   Final    Setup Time [id number]   Final    Colony Count >=100,000 COLONIES/ML   Final    Culture PROTEUS MIRABILIS   Final    Report Status 03/12/2011 FINAL   Final    Organism ID, Bacteria PROTEUS MIRABILIS   Final   MRSA PCR SCREENING     Status: Normal   Collection Time   03/10/11  2:57 AM      Component Value Range Status Comment   MRSA by PCR NEGATIVE  NEGATIVE  Final  Studies/Results: All recent x-ray/radiology reports have been reviewed in detail.   Medications: I have reviewed the patient's complete medication list.  Assessment/Plan:  Chest pain/NSTEMI Known CAD s/p CABG in [place] in 2001 and stent post-CABG - cath revealed progression of disease in SVG as well as native LAD - felt to be candidate for medical tx only (tortuous lesion)  Epigastric pain GI has accomplished EGD wch revealed a hiatal hernia - recc is for BID PPI + trial of sucralfate - i have explained to her that the first step in tx is medical care, and that only in extreme symptomatic cases is surgery indicated - i have explained to her that she would be a very poor candidate for surgical intervention due to the extent of her coronary disease - cont PPI + carafate  Proteus  UTI Continue abx tx  DM2 No change in tx plan today  Hx of CVA w/ expressive aphasia Communicates quite well w/ written word  HTN Not well controlled - adjusting medical tx  Hypercholesterolemia  Disposition Strive for better control of GI sx prior to d/c home   Lonia Blood, MD [facility] Office  [phone] Pager [phone]  On-Call/Text Page:      [email or web address]      password [facility]

## 2011-03-13 ENCOUNTER — Encounter (HOSPITAL_COMMUNITY): Payer: Self-pay | Admitting: Gastroenterology

## 2011-03-13 LAB — GLUCOSE, CAPILLARY
Glucose-Capillary: 123 mg/dL — ABNORMAL HIGH (ref 70–99)
Glucose-Capillary: 236 mg/dL — ABNORMAL HIGH (ref 70–99)

## 2011-03-13 LAB — CBC
HCT: 36.5 % (ref 36.0–46.0)
MCH: 26.6 pg (ref 26.0–34.0)
MCV: 83 fL (ref 78.0–100.0)
Platelets: 190 10*3/uL (ref 150–400)
RBC: 4.4 MIL/uL (ref 3.87–5.11)
RDW: 13.2 % (ref 11.5–15.5)

## 2011-03-13 MED ORDER — NITROGLYCERIN 0.4 MG/HR TD PT24: 1.0000 | MEDICATED_PATCH | Freq: Every day | TRANSDERMAL | Status: DC

## 2011-03-13 MED ORDER — ASPIRIN 81 MG PO TBEC: 81.0000 mg | DELAYED_RELEASE_TABLET | Freq: Every day | ORAL | Status: DC

## 2011-03-13 MED ORDER — ESOMEPRAZOLE MAGNESIUM 40 MG PO CPDR: 40.0000 mg | DELAYED_RELEASE_CAPSULE | Freq: Two times a day (BID) | ORAL | Status: DC

## 2011-03-13 MED ORDER — SUCRALFATE 1 GM/10ML PO SUSP: 1.0000 g | Freq: Three times a day (TID) | ORAL | Status: AC

## 2011-03-13 MED ORDER — AMLODIPINE BESYLATE 5 MG PO TABS: 5.0000 mg | ORAL_TABLET | Freq: Every day | ORAL | Status: DC

## 2011-03-13 MED ORDER — NITROGLYCERIN 0.4 MG SL SUBL: 0.4000 mg | SUBLINGUAL_TABLET | SUBLINGUAL | Status: DC | PRN

## 2011-03-13 NOTE — Progress Notes (Signed)
Inpatient Diabetes Program Recommendations  AACE/ADA: New Consensus Statement on Inpatient Glycemic Control (2009)  Target Ranges:  Prepandial:   less than 140 mg/dL      Peak postprandial:   less than 180 mg/dL (1-2 hours)      Critically ill patients:  140 - 180 mg/dL   Results for [name] [name], [name] [name] (MRN [id number]) as of 03/13/2011 15:11  Ref. Range 03/12/2011 16:48 03/12/2011 20:21 03/12/2011 23:27 03/13/2011 04:24 03/13/2011 08:14 03/13/2011 11:36  Glucose-Capillary Latest Range: 70-99 mg/dL 409 (H) 811 (H) 914 (H) 123 (H) 236 (H) 247 (H)   Inpatient Diabetes Program Recommendations Insulin - Basal: Start basal insulin Lantus 15 units at HS

## 2011-03-13 NOTE — Progress Notes (Signed)
Physical Therapy Evaluation Patient Details Name: [name] [name] MRN: [id number] DOB: 01/15/1941 Today's Date: 03/13/2011  Problem List:  Patient Active Problem List  Diagnoses  . Chest pain  . HTN (hypertension)  . Diabetes mellitus  . CVA (cerebral infarction)  . CAD (coronary artery disease), native coronary artery  . Hx of CABG  . S/P PTCA (percutaneous transluminal coronary angioplasty)    Past Medical History:  Past Medical History  Diagnosis Date  . Stroke   . Hypertension   . Hypercholesteremia   . Complication of anesthesia     lung   . Angina   . GERD (gastroesophageal reflux disease)   . Myocardial infarction   . Peripheral vascular disease   . Diabetes mellitus   . H/O hiatal hernia    Past Surgical History:  Past Surgical History  Procedure Date  . Coronary artery bypass graft   . Abdominal hysterectomy   . Breast biopsy   . Esophagogastroduodenoscopy 03/12/2011    Procedure: ESOPHAGOGASTRODUODENOSCOPY (EGD);  Surgeon: Theda Belfast, MD;  Location: [facility] ENDOSCOPY;  Service: Endoscopy;  Laterality: N/A;    PT Assessment/Plan/Recommendation PT Assessment Clinical Impression Statement: Pt presented with CP/NSTEMI and has a hx of CVA 32yrs ago. Pt very pleasant and communicating verbally and through writing. Pt required assist to don shoes and AFO today secondary to lack of home setup. Pt baseline mobility is transferring to and from her power chair for independence. Recommend continued HHPT pt was already receiving. Also encourage OOB daily with nursing supervision. PT Recommendation/Assessment: Patient will need skilled PT in the acute care venue PT Problem List: Decreased activity tolerance;Decreased safety awareness PT Therapy Diagnosis : Abnormality of gait;Hemiplegia non-dominant side PT Plan PT Frequency: Min 2X/week PT Treatment/Interventions: Gait training;Functional mobility training;Therapeutic activities;Patient/family education PT  Recommendation Follow Up Recommendations: Home health PT Equipment Recommended: None recommended by PT PT Goals  Acute Rehab PT Goals PT Goal Formulation: With patient Time For Goal Achievement: 7 days Pt will go Supine/Side to Sit: with modified independence PT Goal: Supine/Side to Sit - Progress: Goal set today Pt will go Stand to Sit: with modified independence PT Goal: Stand to Sit - Progress: Goal set today Pt will Transfer Bed to Chair/Chair to Bed: with modified independence PT Transfer Goal: Bed to Chair/Chair to Bed - Progress: Goal set today Pt will Ambulate: 1 - 15 feet;with supervision;with least restrictive assistive device PT Goal: Ambulate - Progress: Goal set today  PT Evaluation Precautions/Restrictions  Precautions Precaution Comments: AFO for ambulationi Prior Functioning  Home Living Lives With: Alone Receives Help From: Family Type of Home: Apartment Home Layout: One level Home Access: Level entry Bathroom Shower/Tub: Engineer, manufacturing systems: Standard Bathroom Accessibility: Yes How Accessible: Accessible via wheelchair Home Adaptive Equipment: Tub transfer bench;Quad cane;Wheelchair - powered;Other (comment);Hospital bed (hemiwalker, AFO) Prior Function Level of Independence: Needs assistance with homemaking;Needs assistance with ADLs;Requires assistive device for independence;Independent with transfers Bath: Moderate Light Housekeeping: Maximal Driving: No Vocation: Retired Producer, television/film/video: Awake/alert Overall Cognitive Status: Appears within functional limits for tasks assessed Orientation Level: Oriented X4 Sensation/Coordination   Extremity Assessment RLE Assessment RLE Assessment: Exceptions to [facility] RLE Strength RLE Overall Strength Comments: hemiparesis at baseline Mobility (including Balance) Bed Mobility Bed Mobility: Yes Supine to Sit: 5: Supervision;HOB flat;With rails Supine to Sit Details (indicate cue  type and reason): Increased time to complete, pt uses L rail to assist with pivoting to EOB Sitting - Scoot to Edge of Bed: 4: Min assist Sitting - Scoot  to Edge of Bed Details (indicate cue type and reason): assist with pad, pt reports bed is too high compared to home limiting her ability to scoot forward Transfers Sit to Stand: 5: Supervision;From bed Stand to Sit: 4: Min assist;To chair/3-in-1 Stand to Sit Details: Pt attempting to sit when still a foot away from surface with assist to reach surface and control descent. Pt reports fatigued from ambulation and that turning to chair is hard and she was too tired to control sitting. Pt generally only performs stand pivots and ambulates if HHPT is present. Ambulation/Gait Ambulation/Gait: Yes Ambulation/Gait Assistance: 5: Supervision Ambulation/Gait Assistance Details (indicate cue type and reason): hemiparetic gait with flexed posture, increased difficulty with turning to chair Ambulation Distance (Feet): 28 Feet Assistive device: Other (Comment);Hemi-walker Gait Pattern: Trunk flexed;Step-to pattern Stairs: No  Posture/Postural Control Posture/Postural Control: Postural limitations Postural Limitations: kyphotic posture Balance Balance Assessed: No (pt used AD at baseline) Exercise    End of Session PT - End of Session Equipment Utilized During Treatment: Gait belt Activity Tolerance: Patient tolerated treatment well Patient left: in chair;with call bell in reach Nurse Communication: Mobility status for transfers;Mobility status for ambulation General Behavior During Session: [facility] for tasks performed Cognition: [facility] for tasks performed  Delorse Lek 03/13/2011, 11:11 AM Toney Sang, PT [phone]

## 2011-03-13 NOTE — Progress Notes (Signed)
Patient ID: [name] [name], female   DOB: [birth year], 71 y.o.   MRN: [id number] Subjective: The patient feels well.  She reports benefit with the antacid regimen.  Objective: Vital signs in last 24 hours: Temp:  [98.2 F (36.8 C)-99.5 F (37.5 C)] 98.3 F (36.8 C) (01/17 0435) Pulse Rate:  [69-76] 69  (01/17 0435) Resp:  [5-51] 18  (01/17 0435) BP: (113-169)/(55-124) 113/70 mmHg (01/17 0435) SpO2:  [94 %-100 %] 98 % (01/17 0435) Weight:  [78.926 kg (174 lb)-83.9 kg (184 lb 15.5 oz)] 78.926 kg (174 lb) (01/17 0500) Last BM Date: 03/09/11  Intake/Output from previous day: 01/16 0701 - 01/17 0700 In: 460 [P.O.:360; IV Piggyback:100] Out: 4050 [Urine:4050] Intake/Output this shift:    General appearance: alert and no distress GI: soft, non-tender; bowel sounds normal; no masses,  no organomegaly  Lab Results:  Basename 03/13/11 0545 03/12/11 0507 03/11/11 0834  WBC 6.4 8.0 6.5  HGB 11.7* 11.7* 10.3*  HCT 36.5 36.2 32.5*  PLT 190 173 148*   BMET  Basename 03/11/11 0834  NA 141  K 3.8  CL 108  CO2 25  GLUCOSE 142*  BUN 11  CREATININE 0.77  CALCIUM 8.7   LFT No results found for this basename: PROT,ALBUMIN,AST,ALT,ALKPHOS,BILITOT,BILIDIR,IBILI in the last 72 hours PT/INR  Basename 03/10/11 1130  LABPROT 14.3  INR 1.09   Hepatitis Panel No results found for this basename: HEPBSAG,HCVAB,HEPAIGM,HEPBIGM in the last 72 hours C-Diff No results found for this basename: CDIFFTOX:3 in the last 72 hours Fecal Lactopherrin No results found for this basename: FECLLACTOFRN in the last 72 hours  Studies/Results: No results found.  Medications:  Scheduled:   . amLODipine  5 mg Oral Daily  . aspirin EC  81 mg Oral Daily  . clopidogrel  75 mg Oral Daily  . famotidine  40 mg Oral QAC breakfast  . fentaNYL  50 mcg Intravenous Once  . insulin aspart  0-20 Units Subcutaneous Q4H  . levofloxacin  750 mg Oral Daily  . nitroGLYCERIN  0.4 mg Transdermal Daily  . pantoprazole   40 mg Oral BID AC  . rosuvastatin  40 mg Oral q1800  . sodium chloride  3 mL Intravenous Q12H  . sodium chloride  3 mL Intravenous Q12H  . sucralfate  1 g Oral TID WC & HS  . DISCONTD: cefTRIAXone (ROCEPHIN)  IV  1 g Intravenous Daily   Continuous:   Assessment/Plan: 1) GERD 2) CAD   She is improved today.  I will have her continue with the current regimen of Protonix BID and sucralfate QID.  Plan: 1) Continue treatment. 2) Follow up in the office in 2 weeks. 3) Signing off.  LOS: 4 days   Jara Feider D 03/13/2011, 8:02 AM

## 2011-03-13 NOTE — Treatment Plan (Signed)
Patient events noted: Patient came in with NTEMI with  mild progression of CAD. Cardiac cath essentially revealing a new lesion distal to LIMA insertion of the LAD which is about 80%. However this can be treated medically. SVG to RCA, LIMA to LAD, are patent. SVG to OM is occluded and this is old. The LAD lesion itself does not appear to be acute. Hence this can be treated for chronic stable angina pectoris. I suspect her chest pain is probably related to narcotic issues including GERD. Non-ST elevation myocardial infarct is probably due to small vessel disease. Would recommend continuing present medical therpy

## 2011-03-13 NOTE — Progress Notes (Signed)
Pt discharge instructions and patient education complete. IV site d/c. Site WNL. Pt and daughter had no further questions. Instructions and prescriptions given to daughter. D/C via wheelchair. Dion Saucier

## 2011-04-16 NOTE — Discharge Summary (Signed)
DISCHARGE SUMMARY  [name] [name]  MR#: [id number]  DOB:07/20/1940  Date of Admission: 03/09/2011 Date of Discharge: 04/16/2011  Attending Physician:Kaitlin Alcindor  Patient's YNW:GNFAO,ZHYQMVHQI R, MD, MD  Consults:  Dr Jeani Hawking- GI  Presenting Complaint: Chest pain  Discharge Diagnoses: Principal Problem:  *Chest pain/NSTEMI Epigastric Pain - due to GERD in relation to a hiatal hernia.  Proteus UTI  Past medical history:  HTN (hypertension)  Diabetes mellitus  CVA (cerebral infarction)  CAD (coronary artery disease), native coronary artery  Hx of CABG  S/P PTCA (percutaneous transluminal coronary angioplasty)    Discharge Medications: Medication List  As of 04/16/2011  8:52 PM   TAKE these medications         amLODipine 5 MG tablet   Commonly known as: NORVASC   Take 1 tablet (5 mg total) by mouth daily.      aspirin 81 MG EC tablet   Take 1 tablet (81 mg total) by mouth daily.      clopidogrel 75 MG tablet   Commonly known as: PLAVIX   Take 75 mg by mouth daily.      esomeprazole 40 MG capsule   Commonly known as: NEXIUM   Take 1 capsule (40 mg total) by mouth 2 (two) times daily.      hydrochlorothiazide 25 MG tablet   Commonly known as: HYDRODIURIL   Take 25 mg by mouth daily.      insulin aspart protamine-insulin aspart (70-30) 100 UNIT/ML injection   Commonly known as: NOVOLOG 70/30   Inject 40 Units into the skin 2 (two) times daily.      nitroGLYCERIN 0.4 mg/hr   Commonly known as: NITRODUR - Dosed in mg/24 hr   Place 1 patch (0.4 mg total) onto the skin daily.      nitroGLYCERIN 0.4 MG SL tablet   Commonly known as: NITROSTAT   Place 1 tablet (0.4 mg total) under the tongue every 5 (five) minutes as needed for chest pain.      rosuvastatin 40 MG tablet   Commonly known as: CRESTOR   Take 40 mg by mouth at bedtime.             Procedures: EGD 1/16 - Hiatal hernia  Hospital Course: Principal Problem:  *Chest pain/NSTEMI This is  a 71 y/o female who presented with a complaint of chest pain radiating from her left chest to her left arm. She developed elevated cardiac enzymes- TNI max was 6.33. A cardiology consult was requested and she underwent a cardiac cath on 1/14 by Dr Jacinto Halim. She was noted to have severe tortuosity of the LIMA making it difficult to reach the stenosis. Medical therapy was recommended  Epigastric discomfort A GI consult was requested and she underwent an EGD on 1/16 which only revealed a hiatal hernia. A BID PPI was recommended by Dr Elnoria Howard.   Proteus UTI This was treated with Rocephin  Active Problems: Her other medical issues remained stable.  HTN (hypertension)  Diabetes mellitus  CVA (cerebral infarction)  CAD (coronary artery disease)/ Hx of CABG     Day of Discharge Physical Exam: BP 113/70  Pulse 69  Temp(Src) 98.3 F (36.8 C) (Oral)  Resp 18  Ht 5\' 7"  (1.702 m)  Wt 78.926 kg (174 lb)  BMI 27.25 kg/m2  SpO2 98% General appearance: alert and cooperative  Head: Normocephalic, without obvious abnormality, atraumatic  Throat: lips, mucosa, and tongue normal; teeth and gums normal  Lungs: clear to auscultation bilaterally  Heart: regular rate  and rhythm, S1, S2 normal, no murmur, click, rub or gallop  Abdomen: soft,tender in epigastrium; bowel sounds normal; no masses, no organomegaly  Extremities: extremities normal, atraumatic, no cyanosis or edema   No results found for this or any previous visit (from the past 24 hour(s)).  Disposition: stable for discharge   Follow-up Appts: Discharge Orders    Future Orders Please Complete By Expires   Diet - low sodium heart healthy      Comments:   Diabetic, low fat and bland   Increase activity slowly         Follow-up with Dr.Ganji in 3 weeks.  Time on Discharge: >45 min  Signed: Mega Kinkade 04/16/2011, 8:52 PM

## 2011-04-29 ENCOUNTER — Ambulatory Visit: Payer: Medicare HMO | Attending: Internal Medicine | Admitting: Physical Therapy

## 2011-04-29 DIAGNOSIS — M6281 Muscle weakness (generalized): Secondary | ICD-10-CM | POA: Insufficient documentation

## 2011-04-29 DIAGNOSIS — I69998 Other sequelae following unspecified cerebrovascular disease: Secondary | ICD-10-CM | POA: Insufficient documentation

## 2011-04-29 DIAGNOSIS — IMO0001 Reserved for inherently not codable concepts without codable children: Secondary | ICD-10-CM | POA: Insufficient documentation

## 2011-07-24 ENCOUNTER — Inpatient Hospital Stay (HOSPITAL_COMMUNITY)
Admission: EM | Admit: 2011-07-24 | Discharge: 2011-07-27 | DRG: 064 | Disposition: A | Discharge status: Home Health | Payer: Medicare HMO | Attending: Internal Medicine | Admitting: Internal Medicine

## 2011-07-24 ENCOUNTER — Encounter (HOSPITAL_COMMUNITY): Payer: Self-pay | Admitting: *Deleted

## 2011-07-24 ENCOUNTER — Emergency Department (HOSPITAL_COMMUNITY): Payer: Medicare HMO

## 2011-07-24 DIAGNOSIS — I214 Non-ST elevation (NSTEMI) myocardial infarction: Secondary | ICD-10-CM | POA: Diagnosis present

## 2011-07-24 DIAGNOSIS — I69920 Aphasia following unspecified cerebrovascular disease: Secondary | ICD-10-CM

## 2011-07-24 DIAGNOSIS — Z951 Presence of aortocoronary bypass graft: Secondary | ICD-10-CM

## 2011-07-24 DIAGNOSIS — I10 Essential (primary) hypertension: Secondary | ICD-10-CM | POA: Diagnosis present

## 2011-07-24 DIAGNOSIS — G8190 Hemiplegia, unspecified affecting unspecified side: Secondary | ICD-10-CM

## 2011-07-24 DIAGNOSIS — I639 Cerebral infarction, unspecified: Secondary | ICD-10-CM | POA: Diagnosis present

## 2011-07-24 DIAGNOSIS — R7989 Other specified abnormal findings of blood chemistry: Secondary | ICD-10-CM | POA: Diagnosis present

## 2011-07-24 DIAGNOSIS — I2510 Atherosclerotic heart disease of native coronary artery without angina pectoris: Secondary | ICD-10-CM | POA: Diagnosis present

## 2011-07-24 DIAGNOSIS — E119 Type 2 diabetes mellitus without complications: Secondary | ICD-10-CM | POA: Diagnosis present

## 2011-07-24 DIAGNOSIS — R9431 Abnormal electrocardiogram [ECG] [EKG]: Secondary | ICD-10-CM

## 2011-07-24 DIAGNOSIS — K449 Diaphragmatic hernia without obstruction or gangrene: Secondary | ICD-10-CM | POA: Diagnosis present

## 2011-07-24 DIAGNOSIS — E7800 Pure hypercholesterolemia, unspecified: Secondary | ICD-10-CM | POA: Diagnosis present

## 2011-07-24 DIAGNOSIS — I69959 Hemiplegia and hemiparesis following unspecified cerebrovascular disease affecting unspecified side: Secondary | ICD-10-CM

## 2011-07-24 DIAGNOSIS — G459 Transient cerebral ischemic attack, unspecified: Secondary | ICD-10-CM | POA: Diagnosis present

## 2011-07-24 DIAGNOSIS — I6350 Cerebral infarction due to unspecified occlusion or stenosis of unspecified cerebral artery: Principal | ICD-10-CM | POA: Diagnosis present

## 2011-07-24 DIAGNOSIS — I252 Old myocardial infarction: Secondary | ICD-10-CM

## 2011-07-24 DIAGNOSIS — E109 Type 1 diabetes mellitus without complications: Secondary | ICD-10-CM

## 2011-07-24 DIAGNOSIS — I672 Cerebral atherosclerosis: Secondary | ICD-10-CM | POA: Diagnosis present

## 2011-07-24 DIAGNOSIS — K219 Gastro-esophageal reflux disease without esophagitis: Secondary | ICD-10-CM | POA: Diagnosis present

## 2011-07-24 HISTORY — DX: Type 2 diabetes mellitus without complications: E11.9

## 2011-07-24 LAB — DIFFERENTIAL
Basophils Absolute: 0 10*3/uL (ref 0.0–0.1)
Basophils Relative: 0 % (ref 0–1)
Lymphocytes Relative: 35 % (ref 12–46)
Monocytes Absolute: 0.7 10*3/uL (ref 0.1–1.0)
Monocytes Relative: 9 % (ref 3–12)
Neutro Abs: 4.3 10*3/uL (ref 1.7–7.7)
Neutrophils Relative %: 54 % (ref 43–77)

## 2011-07-24 LAB — CBC
HCT: 39.4 % (ref 36.0–46.0)
Hemoglobin: 12.6 g/dL (ref 12.0–15.0)
MCHC: 32 g/dL (ref 30.0–36.0)
RDW: 13.6 % (ref 11.5–15.5)
WBC: 8 10*3/uL (ref 4.0–10.5)

## 2011-07-24 LAB — APTT: aPTT: 26 seconds (ref 24–37)

## 2011-07-24 LAB — COMPREHENSIVE METABOLIC PANEL
AST: 18 U/L (ref 0–37)
Albumin: 3.6 g/dL (ref 3.5–5.2)
Alkaline Phosphatase: 68 U/L (ref 39–117)
CO2: 27 mEq/L (ref 19–32)
Chloride: 106 mEq/L (ref 96–112)
GFR calc non Af Amer: 69 mL/min — ABNORMAL LOW (ref >90)
Potassium: 3.7 mEq/L (ref 3.5–5.1)
Total Bilirubin: 0.3 mg/dL (ref 0.3–1.2)

## 2011-07-24 LAB — POCT I-STAT, CHEM 8
BUN: 15 mg/dL (ref 6–23)
Chloride: 107 mEq/L (ref 96–112)
Creatinine, Ser: 1 mg/dL (ref 0.50–1.10)
Potassium: 3.8 mEq/L (ref 3.5–5.1)
Sodium: 144 mEq/L (ref 135–145)
TCO2: 27 mmol/L (ref 0–100)

## 2011-07-24 LAB — GLUCOSE, CAPILLARY
Glucose-Capillary: 77 mg/dL (ref 70–99)
Glucose-Capillary: 63 mg/dL — ABNORMAL LOW (ref 70–99)
Glucose-Capillary: 119 mg/dL — ABNORMAL HIGH (ref 70–99)

## 2011-07-24 LAB — PROTIME-INR: INR: 0.96 (ref 0.00–1.49)

## 2011-07-24 MED ORDER — ESCITALOPRAM OXALATE 20 MG PO TABS
20.0000 mg | ORAL_TABLET | Freq: Every day | ORAL | Status: DC
  Administered 2011-07-26 – 2011-07-27 (×2): 20 mg via ORAL
  Filled 2011-07-24 (×3): qty 1

## 2011-07-24 MED ORDER — LISINOPRIL 20 MG PO TABS
20.0000 mg | ORAL_TABLET | Freq: Every day | ORAL | Status: DC
  Administered 2011-07-26 – 2011-07-27 (×2): 20 mg via ORAL
  Filled 2011-07-24 (×3): qty 1

## 2011-07-24 MED ORDER — DEXTROSE 50 % IV SOLN
INTRAVENOUS | Status: AC
  Filled 2011-07-24: qty 50

## 2011-07-24 MED ORDER — ATORVASTATIN CALCIUM 80 MG PO TABS
80.0000 mg | ORAL_TABLET | Freq: Every day | ORAL | Status: DC
  Administered 2011-07-25 – 2011-07-26 (×2): 80 mg via ORAL
  Filled 2011-07-24 (×3): qty 1

## 2011-07-24 MED ORDER — CLOPIDOGREL BISULFATE 75 MG PO TABS
75.0000 mg | ORAL_TABLET | Freq: Every day | ORAL | Status: DC
  Filled 2011-07-24: qty 1

## 2011-07-24 MED ORDER — CARVEDILOL 6.25 MG PO TABS
6.2500 mg | ORAL_TABLET | Freq: Every day | ORAL | Status: DC
  Administered 2011-07-26 – 2011-07-27 (×2): 6.25 mg via ORAL
  Filled 2011-07-24 (×4): qty 1

## 2011-07-24 MED ORDER — ASPIRIN 325 MG PO TABS
325.0000 mg | ORAL_TABLET | Freq: Every day | ORAL | Status: DC
  Filled 2011-07-24: qty 1

## 2011-07-24 MED ORDER — SODIUM CHLORIDE 0.9 % IV SOLN
Freq: Once | INTRAVENOUS | Status: AC
  Administered 2011-07-24: 20 mL/h via INTRAVENOUS

## 2011-07-24 MED ORDER — ASPIRIN 325 MG PO TABS: 325.0000 mg | ORAL_TABLET | Freq: Every day | ORAL | Status: DC

## 2011-07-24 MED ORDER — DEXTROSE 50 % IV SOLN
25.0000 mL | Freq: Once | INTRAVENOUS | Status: AC
  Administered 2011-07-24: 25 mL via INTRAVENOUS

## 2011-07-24 MED ORDER — ENOXAPARIN SODIUM 40 MG/0.4ML ~~LOC~~ SOLN
40.0000 mg | SUBCUTANEOUS | Status: DC
  Administered 2011-07-25 – 2011-07-26 (×2): 40 mg via SUBCUTANEOUS
  Filled 2011-07-24 (×3): qty 0.4

## 2011-07-24 MED ORDER — DEXTROSE 250 MG/ML IV SOLN: 25.0000 mg | Freq: Once | INTRAVENOUS | Status: DC

## 2011-07-24 MED ORDER — HYDROCHLOROTHIAZIDE 25 MG PO TABS
25.0000 mg | ORAL_TABLET | Freq: Every day | ORAL | Status: DC
  Administered 2011-07-26 – 2011-07-27 (×2): 25 mg via ORAL
  Filled 2011-07-24 (×3): qty 1

## 2011-07-24 MED ORDER — ACETAMINOPHEN 325 MG PO TABS: 650.0000 mg | ORAL_TABLET | ORAL | Status: DC | PRN

## 2011-07-24 NOTE — ED Notes (Signed)
CBG 119 

## 2011-07-24 NOTE — ED Notes (Signed)
Pt aphasic, communicates in writing that she is wet, needs change of clothing, wants something to eat at this time.  Writes she is a diabetic and wants food and sugar checked.   Follows commands.  Right arm contracted, right leg in brace.  Moves left UE and LLE freely.  Diaper soiled.  IN process of cleaning pt and checking CBG.  Will check with MD regarding food.  Daughter states pt eats reg diet at home.

## 2011-07-24 NOTE — ED Notes (Signed)
cbg 77  

## 2011-07-24 NOTE — ED Provider Notes (Signed)
History     CSN: [id number]  Arrival date & time 07/24/11  1519   First MD Initiated Contact with Patient 07/24/11 1530      Chief Complaint  Patient presents with  . Aphasia    (Consider location/radiation/quality/duration/timing/severity/associated sxs/prior treatment) HPI Comments: [name] [name] is a 71 y.o. Female who is unable to give history. She is here with her daughter, who states that, the patient's husband called her at 24 AM this morning, stating that the patient was moaning and needed to go to the hospital. The daughter arrived at the home at 11:15 AM and found the patient confused; communicating, but unable to say what was wrong with her. She repeatedly asked what is wrong. And stated, I can't hear you. The daughter,stated, has had this before, but it seemed worse than usual. The patient slept in late this morning as she usually does and has not yet eaten breakfast. The daughter feels that she is back to baseline, and that it took about 45 minutes from when she saw her at  11:15 a.m. Today, to regain her usual status. Apparently, the husband that was with the patient is unable to give a coherent history.   Level V Caveat- confusion  [name] [name] is a 71 y.o. Female who was noted by caretakers at her and living facility not talking today. The patient can typically grunt, but today she cannot grunt; this is their usual amount of communicating with her. The family. Last saw her in her usual state yesterday evening. The patient is alert, appears to attempt to talk, but cannot verbalize.    Level V Caveat- Altered Mental Status  The history is provided by the patient.    Past Medical History  Diagnosis Date  . Stroke   . Hypertension   . Hypercholesteremia   . Complication of anesthesia     lung   . Angina   . GERD (gastroesophageal reflux disease)   . Myocardial infarction   . Peripheral vascular disease   . Diabetes mellitus   . H/O hiatal hernia     Past  Surgical History  Procedure Date  . Coronary artery bypass graft   . Abdominal hysterectomy   . Breast biopsy   . Esophagogastroduodenoscopy 03/12/2011    Procedure: ESOPHAGOGASTRODUODENOSCOPY (EGD);  Surgeon: Theda Belfast, MD;  Location: [facility] ENDOSCOPY;  Service: Endoscopy;  Laterality: N/A;    Family History  Problem Relation Age of Onset  . Anesthesia problems Neg Hx   . Hypotension Neg Hx   . Malignant hyperthermia Neg Hx   . Pseudochol deficiency Neg Hx     History  Substance Use Topics  . Smoking status: Never Smoker   . Smokeless tobacco: Not on file  . Alcohol Use: No    OB History    Grav Para Term Preterm Abortions TAB SAB Ect Mult Living                  Review of Systems  Unable to perform ROS   Allergies  Review of patient's allergies indicates no known allergies.  Home Medications   Current Outpatient Rx  Name Route Sig Dispense Refill  . CARVEDILOL 6.25 MG PO TABS Oral Take 6.25 mg by mouth daily.    Marland Kitchen CLOPIDOGREL BISULFATE 75 MG PO TABS Oral Take 75 mg by mouth daily.      Marland Kitchen ESCITALOPRAM OXALATE 20 MG PO TABS Oral Take 20 mg by mouth daily.    Marland Kitchen HYDROCHLOROTHIAZIDE 25 MG  PO TABS Oral Take 25 mg by mouth daily.      . INSULIN ASPART PROT & ASPART (70-30) 100 UNIT/ML [place] SUSP Subcutaneous Inject 28-36 Units into the skin 2 (two) times daily. 36 units in AM and 28 units in PM    . LISINOPRIL 20 MG PO TABS Oral Take 20 mg by mouth daily.    Marland Kitchen NITROGLYCERIN 0.4 MG SL SUBL Sublingual Place 0.4 mg under the tongue every 5 (five) minutes as needed.    Marland Kitchen ROSUVASTATIN CALCIUM 40 MG PO TABS Oral Take 40 mg by mouth at bedtime.        BP 171/111  Pulse 72  Temp(Src) 98.9 F (37.2 C) (Oral)  Resp 18  SpO2 99%  Physical Exam  Nursing note and vitals reviewed. Constitutional: She appears well-developed and well-nourished.  HENT:  Head: Normocephalic and atraumatic.  Eyes: Conjunctivae and EOM are normal. Pupils are equal, round, and reactive to light.    Neck: Normal range of motion and phonation normal. Neck supple.  Cardiovascular: Normal rate, regular rhythm and intact distal pulses.   Pulmonary/Chest: Effort normal and breath sounds normal. She exhibits no tenderness.  Abdominal: Soft. She exhibits no distension. There is no tenderness. There is no guarding.  Musculoskeletal:       Brace on right lower leg  Neurological: She is alert. She has normal strength. No cranial nerve deficit. She exhibits normal muscle tone. Coordination normal.       Nonverbal. Follows commands for exam accurately.   Skin: Skin is warm and dry.  Psychiatric: Her behavior is normal.    ED Course  Procedures (including critical care time)    Date: 07/24/2011  Rate: 60  Rhythm: normal sinus rhythm  QRS Axis: normal  Intervals: normal  ST/T Wave abnormalities: ST depressions laterally TWI laterally  Conduction Disutrbances:none  Narrative Interpretation:   Old EKG Reviewed: changes noted    Labs Reviewed  COMPREHENSIVE METABOLIC PANEL - Abnormal; Notable for the following:    GFR calc non Af Amer 69 (*)    GFR calc Af Amer 80 (*)    All other components within normal limits  CK TOTAL AND CKMB - Abnormal; Notable for the following:    Relative Index 2.6 (*)    All other components within normal limits  TROPONIN I - Abnormal; Notable for the following:    Troponin I 1.22 (*)    All other components within normal limits  GLUCOSE, CAPILLARY - Abnormal; Notable for the following:    Glucose-Capillary 63 (*)    All other components within normal limits  GLUCOSE, CAPILLARY - Abnormal; Notable for the following:    Glucose-Capillary 119 (*)    All other components within normal limits  PROTIME-INR  APTT  CBC  DIFFERENTIAL  POCT I-STAT, CHEM 8  GLUCOSE, CAPILLARY  URINALYSIS, ROUTINE W REFLEX MICROSCOPIC   Ct Head Wo Contrast  07/24/2011  *RADIOLOGY REPORT*  Clinical Data: Increasing aphasia.  Stroke 10 years ago.  CT HEAD WITHOUT CONTRAST   Technique:  Contiguous axial images were obtained from the base of the skull through the vertex without contrast.  Comparison: None.  Findings: Patchy and more focal areas of low density in the white matter of both cerebral hemispheres.  The more focal areas involve the corona radiata, basal ganglia and internal capsules bilaterally, compatible with old infarcts.  No visible brainstem abnormalities.  Mildly enlarged ventricles and subarachnoid spaces. No intracranial hemorrhage, mass lesion or CT evidence of acute infarction.  Small right maxillary sinus retention cyst. Unremarkable bones.  IMPRESSION:  1.  No acute abnormality. 2.  Old bilateral corona radiata infarcts extending into the basal ganglia and internal capsules. 3.  Moderate chronic small vessel white matter ischemic changes in both cerebral hemispheres. 4.  Mild atrophy.  Original Report Authenticated By: Darrol Angel, M.D.     1. CVA (cerebral infarction)   2. Abnormal EKG       MDM  Change in ability to vocalize. Altered swallowing ability. Likely acute CVA. Mild elevation of troponin I is nonspecific. She does have somewhat changed EKG. She has complex coronary anatomy and is status post recent cardiac catheterization. She will need cycling of enzymes. She'll likely need cardiology consultation. Will admit for further management, evaluation, and disposition, based on findings.        Flint Melter, MD 07/24/11 2322

## 2011-07-24 NOTE — ED Notes (Signed)
cbg  

## 2011-07-24 NOTE — ED Notes (Signed)
CBG 63  

## 2011-07-24 NOTE — ED Notes (Signed)
Patient with history of stroke x 10 years ago, patient with residual effects from event. Patient today experiencing increasing aphasia and unable to communicate,  Per EMS/family patient previously communicated with garbled speech but today unable to make words, patient can make simple sounds, patient follows commands and is A/O x 3

## 2011-07-24 NOTE — ED Notes (Signed)
Assisted CJ-emt with cleaning pt. and changing pts. linens.

## 2011-07-24 NOTE — H&P (Signed)
PCP:  Ralene Ok, MD, MD   Not confirmed  Chief Complaint:  Cannot move jaw muscles, R foot weakness  HPI: 70yoF with h/o CAD s/p CABG 2001 with ? 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   Pt was last admitted to [facility] 02/2011 with left chest pain and positive Troponin. Dr.  Jacinto Halim was consulted, she went to cath and noted to have severe tortuosity of LIMA  making access of the stenosis difficult, medically treated. She had EGD for epigastric  discomfort which showed hiatal hernia.   Pt is good historian, who communicates by writing with her left hand. She writes to me  "I cannot move food in my mouth, my jaw muscles will not function" and also "last night  I could not move my right foot when I transfer for bed." She states she can usually  walk with some assistance, and move her right foot, but couldn't at all last night. She  went to bed, woke up and ate breakfast and was able to use her mouth but my lunch  couldn't. Things have gotten minimally better since being in the ED.   Labs show normal chem, LFT's, CBC. Troponin positive at 1.22 but negative CK and MB.  INR 0.96. CT head negative for acute, but shows old bilateral corona radiata infarcts  extending into basal ganglia and internal capsules, and other chronic small vessel  changes. Per ED discussion, she has failed her swallow screening. She was given some  dextrose for hypoglycemia to the 60's in the ED.   When asked about chest pain, pt writes "Yes, I have costochondritis" and does state  she's had pain in the past week that she thought was costochondritis. She denies any  pain worse than her baseline chronic pain, that also seems to be related to food intake  for which she  take a medication and it goes away.   ROS otherwise negative or unremarkable.     Past Medical History  Diagnosis Date  . 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 Communicates by writing   . Hypertension   . Hypercholesteremia   . Complication of anesthesia     lung   . Angina   . GERD (gastroesophageal reflux disease)   . Myocardial infarction     CABG 2001, ? stent. Cath 02/2011 with new distal LIMA-LAD 80%. SVG-OM occlusion is old, rest of grafts patent  . Peripheral vascular disease   . Diabetes mellitus   . H/O hiatal hernia     Past Surgical History  Procedure Date  . Coronary artery bypass graft   . Abdominal hysterectomy   . Breast biopsy   . Esophagogastroduodenoscopy 03/12/2011    Procedure: ESOPHAGOGASTRODUODENOSCOPY (EGD);  Surgeon: Theda Belfast, MD;  Location: [facility] ENDOSCOPY;  Service: Endoscopy;  Laterality: N/A;  . Cardiac catheterization 02/2011    Medications:  HOME MEDS:  Prior to Admission medications   Medication Sig Start Date End Date Taking? Authorizing Provider  carvedilol (COREG) 6.25 MG tablet Take 6.25 mg by mouth daily.   Yes Historical Provider, MD  clopidogrel (PLAVIX) 75 MG tablet Take 75 mg by mouth daily.     Yes Historical Provider, MD  escitalopram (LEXAPRO) 20 MG tablet Take  20 mg by mouth daily.   Yes Historical Provider, MD  hydrochlorothiazide (HYDRODIURIL) 25 MG tablet Take 25 mg by mouth daily.     Yes Historical Provider, MD  insulin aspart protamine-insulin aspart (NOVOLOG 70/30) (70-30) 100 UNIT/ML injection Inject 28-36 Units into the skin 2 (two) times daily. 36 units in AM and 28 units in PM   Yes Historical Provider, MD  lisinopril (PRINIVIL,ZESTRIL) 20 MG tablet Take 20 mg by mouth daily.   Yes Historical Provider, MD  nitroGLYCERIN (NITROSTAT) 0.4 MG SL tablet Place 0.4 mg under the tongue every 5 (five) minutes as needed. 03/13/11 03/12/12 Yes Calvert Cantor, MD  rosuvastatin (CRESTOR) 40 MG tablet Take  40 mg by mouth at bedtime.     Yes Historical Provider, MD    Allergies:  No Known Allergies  Social History:   reports that she has never smoked. She does not have any smokeless tobacco history on file. She reports that she does not drink alcohol or use illicit drugs. She still lives at home alone and still does her housework, cooks for herself, feeds herself. Able to walk weakly  Family History: Family History  Problem Relation Age of Onset  . Anesthesia problems Neg Hx   . Hypotension Neg Hx   . Malignant hyperthermia Neg Hx   . Pseudochol deficiency Neg Hx     Physical Exam: Filed Vitals:   07/24/11 1524 07/24/11 1801 07/24/11 1939 07/24/11 2152  BP: 151/68 164/80 171/111 142/57  Pulse:  68 72 66  Temp: 97.6 F (36.4 C) 98.8 F (37.1 C) 98.9 F (37.2 C) 98 F (36.7 C)  TempSrc: Oral Oral Oral Oral  Resp: 20 19 18 12   SpO2: 98%  99%    Blood pressure 142/57, pulse 66, temperature 98 F (36.7 C), temperature source Oral, resp. rate 12, SpO2 99.00%. Gen: Elderly, somewhat diaphoretic F in no distress, appears stable, but is drooling  everywhere. She cannot talk, but can make some grunting noises, but she can write well  with her left hand and is communicative in writing. She looks diaphoretic.  HEENT: Pupils round, reactive ~48mm, arcus senilis present. Sclera clear, EOMI except  her right lateral gaze has some minimal palsy. No nystagmus. Her tongue is hanging out  of her mouth and she is drooling, she appears to have poor muscle control in her mouth,  however can move her tonge right and left very minimally. There is frank facial  asymmetry with loss of right nasolabial fold. Mouth is moist Lungs: Right basilar crackle on inspiration noted, good air movement, no wheezes, no  cough, increased WOB or accessory muscles.  Heart: Faint S1/2 noted, very soft, but regular and not tachycardic, no gross m/g Abd: Soft, not tender, not distended, no grimacing. Benign Extrem: RUE  is contracted and limp, paretic, but LUE is normal appearing with  spontaneous movement, able to write on her own. BLE's without edema. She can move both  legs on her own, but with great effort and minimally. Hands and feet are cool but not  frankly cold Neuro: Alert, attentive to conversation, cannot speak. Face as above. Tongue midline at  baseline with minimal movement. Shoulder shrug on RUE is the only stregth she has, LUE  stregth testing normal throughout. She can flex both hips, but stronger on the left,  can keep just barely off the bed. Can flex/extend knee/ankle on LUE and just a tiny bit  on the R. Sensation intact throughout. No clonus noted.  Labs & Imaging Results for orders placed during the hospital encounter of 07/24/11 (from the past 48 hour(s))  PROTIME-INR     Status: Normal   Collection Time   07/24/11  3:33 PM      Component Value Range Comment   Prothrombin Time 13.0  11.6 - 15.2 (seconds)    INR 0.96  0.00 - 1.49    APTT     Status: Normal   Collection Time   07/24/11  3:33 PM      Component Value Range Comment   aPTT 26  24 - 37 (seconds)   CBC     Status: Normal   Collection Time   07/24/11  3:33 PM      Component Value Range Comment   WBC 8.0  4.0 - 10.5 (K/uL)    RBC 4.79  3.87 - 5.11 (MIL/uL)    Hemoglobin 12.6  12.0 - 15.0 (g/dL)    HCT 16.1  09.6 - 04.5 (%)    MCV 82.3  78.0 - 100.0 (fL)    MCH 26.3  26.0 - 34.0 (pg)    MCHC 32.0  30.0 - 36.0 (g/dL)    RDW 40.9  81.1 - 91.4 (%)    Platelets 177  150 - 400 (K/uL)   DIFFERENTIAL     Status: Normal   Collection Time   07/24/11  3:33 PM      Component Value Range Comment   Neutrophils Relative 54  43 - 77 (%)    Neutro Abs 4.3  1.7 - 7.7 (K/uL)    Lymphocytes Relative 35  12 - 46 (%)    Lymphs Abs 2.8  0.7 - 4.0 (K/uL)    Monocytes Relative 9  3 - 12 (%)    Monocytes Absolute 0.7  0.1 - 1.0 (K/uL)    Eosinophils Relative 1  0 - 5 (%)    Eosinophils Absolute 0.1  0.0 - 0.7 (K/uL)    Basophils  Relative 0  0 - 1 (%)    Basophils Absolute 0.0  0.0 - 0.1 (K/uL)   COMPREHENSIVE METABOLIC PANEL     Status: Abnormal   Collection Time   07/24/11  3:33 PM      Component Value Range Comment   Sodium 141  135 - 145 (mEq/L)    Potassium 3.7  3.5 - 5.1 (mEq/L)    Chloride 106  96 - 112 (mEq/L)    CO2 27  19 - 32 (mEq/L)    Glucose, Bld 84  70 - 99 (mg/dL)    BUN 14  6 - 23 (mg/dL)    Creatinine, Ser 7.82  0.50 - 1.10 (mg/dL)    Calcium 9.8  8.4 - 10.5 (mg/dL)    Total Protein 7.3  6.0 - 8.3 (g/dL)    Albumin 3.6  3.5 - 5.2 (g/dL)    AST 18  0 - 37 (U/L)    ALT 17  0 - 35 (U/L)    Alkaline Phosphatase 68  39 - 117 (U/L)    Total Bilirubin 0.3  0.3 - 1.2 (mg/dL)    GFR calc non Af Amer 69 (*) >90 (mL/min)    GFR calc Af Amer 80 (*) >90 (mL/min)   CK TOTAL AND CKMB     Status: Abnormal   Collection Time   07/24/11  3:34 PM      Component Value Range Comment   Total CK 155  7 - 177 (U/L)    CK, MB  4.0  0.3 - 4.0 (ng/mL)    Relative Index 2.6 (*) 0.0 - 2.5    TROPONIN I     Status: Abnormal   Collection Time   07/24/11  3:34 PM      Component Value Range Comment   Troponin I 1.22 (*) <0.30 (ng/mL)   POCT I-STAT, CHEM 8     Status: Normal   Collection Time   07/24/11  3:52 PM      Component Value Range Comment   Sodium 144  135 - 145 (mEq/L)    Potassium 3.8  3.5 - 5.1 (mEq/L)    Chloride 107  96 - 112 (mEq/L)    BUN 15  6 - 23 (mg/dL)    Creatinine, Ser 1.61  0.50 - 1.10 (mg/dL)    Glucose, Bld 85  70 - 99 (mg/dL)    Calcium, Ion 0.96  1.12 - 1.32 (mmol/L)    TCO2 27  0 - 100 (mmol/L)    Hemoglobin 13.6  12.0 - 15.0 (g/dL)    HCT 04.5  40.9 - 81.1 (%)   GLUCOSE, CAPILLARY     Status: Normal   Collection Time   07/24/11  4:07 PM      Component Value Range Comment   Glucose-Capillary 77  70 - 99 (mg/dL)   GLUCOSE, CAPILLARY     Status: Abnormal   Collection Time   07/24/11  7:29 PM      Component Value Range Comment   Glucose-Capillary 63 (*) 70 - 99 (mg/dL)    Comment 1  Documented in Chart      Comment 2 Notify RN     GLUCOSE, CAPILLARY     Status: Abnormal   Collection Time   07/24/11  8:28 PM      Component Value Range Comment   Glucose-Capillary 119 (*) 70 - 99 (mg/dL)    Comment 1 Documented in Chart      Comment 2 Notify RN      Ct Head Wo Contrast  07/24/2011  *RADIOLOGY REPORT*  Clinical Data: Increasing aphasia.  Stroke 10 years ago.  CT HEAD WITHOUT CONTRAST  Technique:  Contiguous axial images were obtained from the base of the skull through the vertex without contrast.  Comparison: None.  Findings: Patchy and more focal areas of low density in the white matter of both cerebral hemispheres.  The more focal areas involve the corona radiata, basal ganglia and internal capsules bilaterally, compatible with old infarcts.  No visible brainstem abnormalities.  Mildly enlarged ventricles and subarachnoid spaces. No intracranial hemorrhage, mass lesion or CT evidence of acute infarction.  Small right maxillary sinus retention cyst. Unremarkable bones.  IMPRESSION:  1.  No acute abnormality. 2.  Old bilateral corona radiata infarcts extending into the basal ganglia and internal capsules. 3.  Moderate chronic small vessel white matter ischemic changes in both cerebral hemispheres. 4.  Mild atrophy.  Original Report Authenticated By: Darrol Angel, M.D.     ECG: NSR 60 bpm, normal axis, P mitrale and LAE in V1, new Q waves in V1-2. Narrow QRS.  No frank ST deviations. TWI in V2 and V5-6, I, aVL, II, aVF. TWF various other leads.  In comparison to prior, the V1-2 Q waves are new. However the prior ECG looks like when  she NSTEMI'd and otherwise the ST deviations are now resolved. TWI's are all new.   Impression Present on Admission:  .TIA (transient ischemic attack) .Elevated troponin I level .Diabetes mellitus .HTN (  hypertension)  70yoF with h/o CAD s/p CABG 2001 with ? stent and NSTEMI 02/2011 s/p cath but medically  managed, h/o CVA 2002 and 2003 with  right hemiparesis and expressive aphasia who  communicates with grunting and writing, DM2, who now presents with R foot weakness and  difficulty using mouth concerning for TIA, and also with positive Troponin.   1. TIA: New ichemia vs recrudescence in setting of ? new MI. If pt's jaw muscles don't recoup and cannot take PO intake, ? would need PEG tube...Marland KitchenMarland Kitchen - Admit TIA protocol, MRI/MRA brain, continue ASA/plavix.  - Have called and spoken to neuro about her case, will obtain imaging and consult if  abnormal.   2. Positive Trop: Pt has negative MB, atypical history but with possible recent chest  pain, and ? new Q waves in V1-2. Suspect this is a missed MI over the past week with  persistent positive Trop.  - Echo, trend enzymes and ECG, ASA 325 but hold on anticougulation - Have called Dr. Jacinto Halim, would f/u in the am  - Continue coreg, lisinopril, plavix, ASA, statin, HCTZ  3. DM: Hypoglycemic episode in the ED. Pt now cannot take PO intake, so will hold her  insulin regimen and monitor BS.   Lovenox prophy Telemetry, [facility] team 9 Presumed full code, did not fully discuss  Other plans as per orders.   Rielly Corlett 07/24/2011, 10:33 PM

## 2011-07-24 NOTE — ED Notes (Signed)
cbg 63 

## 2011-07-25 ENCOUNTER — Other Ambulatory Visit: Payer: Self-pay

## 2011-07-25 ENCOUNTER — Inpatient Hospital Stay (HOSPITAL_COMMUNITY): Payer: Medicare HMO

## 2011-07-25 DIAGNOSIS — R748 Abnormal levels of other serum enzymes: Secondary | ICD-10-CM

## 2011-07-25 DIAGNOSIS — E1165 Type 2 diabetes mellitus with hyperglycemia: Secondary | ICD-10-CM

## 2011-07-25 DIAGNOSIS — I10 Essential (primary) hypertension: Secondary | ICD-10-CM

## 2011-07-25 DIAGNOSIS — G459 Transient cerebral ischemic attack, unspecified: Secondary | ICD-10-CM

## 2011-07-25 DIAGNOSIS — E118 Type 2 diabetes mellitus with unspecified complications: Secondary | ICD-10-CM

## 2011-07-25 DIAGNOSIS — IMO0002 Reserved for concepts with insufficient information to code with codable children: Secondary | ICD-10-CM

## 2011-07-25 DIAGNOSIS — I6340 Cerebral infarction due to embolism of unspecified cerebral artery: Secondary | ICD-10-CM

## 2011-07-25 LAB — CARDIAC PANEL(CRET KIN+CKTOT+MB+TROPI)
CK, MB: 3.1 ng/mL (ref 0.3–4.0)
Relative Index: 2.7 — ABNORMAL HIGH (ref 0.0–2.5)
Relative Index: 2.6 — ABNORMAL HIGH (ref 0.0–2.5)
Relative Index: 2.5 (ref 0.0–2.5)
Troponin I: 0.92 ng/mL (ref <0.30)
Troponin I: 0.87 ng/mL (ref <0.30)

## 2011-07-25 LAB — LIPID PANEL
Cholesterol: 209 mg/dL — ABNORMAL HIGH (ref 0–200)
VLDL: 18 mg/dL (ref 0–40)

## 2011-07-25 LAB — URINALYSIS, ROUTINE W REFLEX MICROSCOPIC
Glucose, UA: NEGATIVE mg/dL
Hgb urine dipstick: NEGATIVE
Leukocytes, UA: NEGATIVE
Specific Gravity, Urine: 1.016 (ref 1.005–1.030)
pH: 6.5 (ref 5.0–8.0)

## 2011-07-25 LAB — GLUCOSE, CAPILLARY
Glucose-Capillary: 140 mg/dL — ABNORMAL HIGH (ref 70–99)
Glucose-Capillary: 203 mg/dL — ABNORMAL HIGH (ref 70–99)

## 2011-07-25 LAB — MRSA PCR SCREENING: MRSA by PCR: NEGATIVE

## 2011-07-25 MED ORDER — CLOPIDOGREL BISULFATE 75 MG PO TABS
75.0000 mg | ORAL_TABLET | Freq: Every day | ORAL | Status: DC
  Administered 2011-07-26 – 2011-07-27 (×2): 75 mg via ORAL
  Filled 2011-07-25 (×2): qty 1

## 2011-07-25 MED ORDER — ASPIRIN-DIPYRIDAMOLE ER 25-200 MG PO CP12
1.0000 | ORAL_CAPSULE | Freq: Two times a day (BID) | ORAL | Status: DC
  Filled 2011-07-25: qty 1

## 2011-07-25 MED ORDER — INSULIN ASPART 100 UNIT/ML ~~LOC~~ SOLN: 0.0000 [IU] | Freq: Three times a day (TID) | SUBCUTANEOUS | Status: DC

## 2011-07-25 MED ORDER — INSULIN ASPART 100 UNIT/ML ~~LOC~~ SOLN
0.0000 [IU] | Freq: Every day | SUBCUTANEOUS | Status: DC
  Administered 2011-07-25: 4 [IU] via SUBCUTANEOUS

## 2011-07-25 MED ORDER — INSULIN ASPART 100 UNIT/ML ~~LOC~~ SOLN
0.0000 [IU] | Freq: Three times a day (TID) | SUBCUTANEOUS | Status: DC
  Administered 2011-07-26: 9 [IU] via SUBCUTANEOUS
  Administered 2011-07-26: 2 [IU] via SUBCUTANEOUS
  Administered 2011-07-27: 5 [IU] via SUBCUTANEOUS

## 2011-07-25 NOTE — Evaluation (Signed)
Physical Therapy Evaluation Patient Details Name: [name] [name] MRN: [id number] DOB: [birth year] Today's Date: 07/25/2011 Time: 4098-1191 PT Time Calculation (min): 45 min  PT Assessment / Plan / Recommendation Clinical Impression  pt presents with CVA and history of CVA with R hemi.  pt had been very Independent PTA, and feel pt should be able to return to home with increased A initially and HHPT to assure home safety.      PT Assessment  Patient needs continued PT services    Follow Up Recommendations  Home health PT;Supervision - Intermittent    Barriers to Discharge None      lEquipment Recommendations  None recommended by PT    Recommendations for Other Services     Frequency Min 4X/week    Precautions / Restrictions Precautions Precautions: Fall Required Braces or Orthoses: Other Brace/Splint Other Brace/Splint: R AFO Restrictions Weight Bearing Restrictions: No   Pertinent Vitals/Pain None      Mobility  Bed Mobility Bed Mobility: Supine to Sit;Sitting - Scoot to Edge of Bed Supine to Sit: 6: Modified independent (Device/Increase time);With rails Sitting - Scoot to Edge of Bed: 6: Modified independent (Device/Increase time) Details for Bed Mobility Assistance: pt has hospital bed at home and demo'd home technique for supine to sit.  pt needs increased time, but able to complete Mod I.   Transfers Transfers: Sit to Stand;Stand to [employer] Transfers Sit to Stand: 5: Supervision;With upper extremity assist;From bed;From chair/3-in-1 Stand to Sit: 5: Supervision;With upper extremity assist;To chair/3-in-1;With armrests Stand Pivot Transfers: 5: Supervision;With armrests Details for Transfer Assistance: pt demo'd how she transfers bed to chair using armrests and performed sit to stand using bedrail to don brief demo'ing home technique.  pt with mild buckling R knee, which pt notes is new.  pt able to maintain balance despite buckling, however with decreased  safety.   Ambulation/Gait Ambulation/Gait Assistance: Not tested (comment) Stairs: No Wheelchair Mobility Wheelchair Mobility: No Modified Rankin (Stroke Patients Only) Pre-Morbid Rankin Score: Slight disability Modified Rankin: Moderate disability    Exercises     PT Diagnosis: Hemiplegia non-dominant side  PT Problem List: Decreased strength;Decreased activity tolerance;Decreased balance;Decreased mobility PT Treatment Interventions: Functional mobility training;Therapeutic activities;Balance training;Neuromuscular re-education;Patient/family education   PT Goals Acute Rehab PT Goals PT Goal Formulation: With patient Time For Goal Achievement: 08/01/11 Potential to Achieve Goals: Good Pt will go Sit to Stand: with modified independence PT Goal: Sit to Stand - Progress: Goal set today Pt will go Stand to Sit: with modified independence PT Goal: Stand to Sit - Progress: Goal set today Pt will Transfer Bed to Chair/Chair to Bed: with modified independence PT Transfer Goal: Bed to Chair/Chair to Bed - Progress: Goal set today  Visit Information  Last PT Received On: 07/25/11 Assistance Needed: +1 PT/OT Co-Evaluation/Treatment: Yes    Subjective Data  Subjective: pt with expressive aphasia, but is able to write for communication.   Patient Stated Goal: Eat again   Prior Functioning  Home Living Lives With: Alone Available Help at Discharge: Personal care attendant (Aide 3 days/wk for 3hrs each.  ) Type of Home: Independent living facility [facility]) Home Access: Level entry Home Layout: One level Bathroom Shower/Tub: Engineer, manufacturing systems: Standard Bathroom Accessibility: Yes How Accessible: Accessible via wheelchair Home Adaptive Equipment: Wheelchair - powered;Walker - rolling;Shower chair with back;Raised toilet seat with rails;Grab bars in shower;Quad cane Prior Function Level of Independence: Needs assistance Needs Assistance: [employer] House[employer] (Aide  helps with cleaning and laundry) Able to Take  Stairs?: No Driving: No Comments: pt uses [employer] for transportation to groceries or daughter drives.   Communication Communication: Expressive difficulties (pt writes for communication)    Cognition  Overall Cognitive Status: Appears within functional limits for tasks assessed/performed Arousal/Alertness: Awake/alert Orientation Level: Oriented X4 / Intact Behavior During Session: WFL for tasks performed    Extremity/Trunk Assessment Right Lower Extremity Assessment RLE ROM/Strength/Tone: Deficits RLE ROM/Strength/Tone Deficits: pt with history R hemi and now notes increased weakness in R LE Hip/Knee 3+/5, Ankle 2/5 RLE Sensation: WFL - Light Touch Left Lower Extremity Assessment LLE ROM/Strength/Tone: WFL for tasks assessed LLE Sensation: WFL - Light Touch   Balance Balance Balance Assessed: No  End of Session PT - End of Session Equipment Utilized During Treatment: Right ankle foot orthosis Activity Tolerance: Patient tolerated treatment well Patient left: in chair;with call bell/phone within reach Nurse Communication: Mobility status   Sunny Schlein, [place] [phone] 07/25/2011, 3:12 PM

## 2011-07-25 NOTE — Progress Notes (Signed)
  Echocardiogram 2D Echocardiogram has been performed.  Emelia Loron A 07/25/2011, 10:40 AM

## 2011-07-25 NOTE — Evaluation (Signed)
Clinical/Bedside Swallow Evaluation Patient Details  Name: [name] [name] MRN: [id number] Date of Birth: Nov 08, 1940  Today's Date: 07/25/2011 Time:  -     Past Medical History:  Past Medical History  Diagnosis Date  . 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 Communicates by writing   . Hypertension   . Hypercholesteremia   . Complication of anesthesia     lung   . Angina   . GERD (gastroesophageal reflux disease)   . Myocardial infarction     CABG 2001, ? stent. Cath 02/2011 with new distal LIMA-LAD 80%. SVG-OM occlusion is old, rest of grafts patent  . Peripheral vascular disease   . Diabetes mellitus   . H/O hiatal hernia    Past Surgical History:  Past Surgical History  Procedure Date  . Coronary artery bypass graft   . Abdominal hysterectomy   . Breast biopsy   . Esophagogastroduodenoscopy 03/12/2011    Procedure: ESOPHAGOGASTRODUODENOSCOPY (EGD);  Surgeon: Theda Belfast, MD;  Location: [facility] ENDOSCOPY;  Service: Endoscopy;  Laterality: N/A;  . Cardiac catheterization 02/2011   HPI:  70yoF admitted with increased weakness in right foot and with jaw. Pt has a history of CVA in 2002 and 2003, the last of which has resulted in a severe dysarthria with inability to produce intelligible speech. Pt uses writing to communicate. Pt has a history of oral dysphagia with poor lingual propulsion. Outpatient MBS in 5/12 showed lack of lingual propulsion, usage of finger to place food in left posterior interdental position for mastication. Pt was recommended to continue thin liquids with trace silent aspiration without any dx of pna and soft foods diet given difficutly showing an history of choking on an apple. Pt now reports that her lingual control of foods has decreased but that she doesnt have any trouble swallowing once she gets foods and liquids to her throat. Pt tells OT that she often purees foods.    Assessment / Plan / Recommendation Clinical Impression  Pt is  known to this SLP from outpatient MBS in 2012. At baseline pt with little to no base of tongue function for bolus propulsion, right lingual weakness. Pt adept at utilizing compensatory strategies to compensate for oral deficits. Current oral motor exam indactes severe weakness with involvement of CN V, X, and XII. Since last observation pt with decreased function of CNV now with limited ability to masticate and form bolus with poor buccal and lingual strength. Pt also not exhibing overt s/s of aspiration with thin liquids suggesting decreased airway protection. (pts swallow was severely delayed at baseline but MBS shoed only trace aspiration). At this time recommend pt initate a pureed diet with nectar thick liquids with a repeat MBS to determine safety with this diet. Pt expressed desire to continue eating and drinking with known aspiration risk but agrees to modified diet for now.     Aspiration Risk  Moderate    Diet Recommendation Dysphagia 1 (Puree);Nectar-thick liquid   Liquid Administration via: Cup Medication Administration: Whole meds with puree Supervision: Patient able to self feed Compensations: Slow rate;Small sips/bites Postural Changes and/or Swallow Maneuvers: Out of bed for meals;Seated upright 90 degrees    Other  Recommendations     Follow Up Recommendations       Frequency and Duration        Pertinent Vitals/Pain NA    SLP Swallow Goals     Swallow Study Prior Functional Status  Type of Home: Independent living facility [facility])  Lives With: Alone Available Help at Discharge: Personal care attendant (Aide 3 days/wk for 3hrs each.  )    General HPI: 70yoF admitted with increased weakness in right foot and with jaw. Pt has a history of CVA in 2002 and 2003, the last of which has resulted in a severe dysarthria with inability to produce intelligible speech. Pt uses writing to communicate. Pt has a history of oral dysphagia with poor lingual propulsion. Outpatient  MBS in 5/12 showed lack of lingual propulsion, usage of finger to place food in left posterior interdental position for mastication. Pt was recommended to continue thin liquids with trace silent aspiration without any dx of pna and soft foods diet given difficutly showing an history of choking on an apple. Pt now reports that her lingual control of foods has decreased but that she doesnt have any trouble swallowing once she gets foods and liquids to her throat. Pt tells OT that she often purees foods.  Type of Study: Bedside swallow evaluation Previous Swallow Assessment: MBS 2012 see history Diet Prior to this Study: NPO Temperature Spikes Noted: No Respiratory Status: Room air Behavior/Cognition: Alert;Cooperative;Pleasant mood Oral Cavity - Dentition: Adequate natural dentition Self-Feeding Abilities: Able to feed self Patient Positioning: Upright in bed Volitional Cough: Strong Volitional Swallow: Able to elicit    Oral/Motor/Sensory Function Overall Oral Motor/Sensory Function: Impaired at baseline (see assessment for oral motor details)   Ice Chips     Thin Liquid Thin Liquid: Impaired Presentation: Cup Oral Phase Impairments: Reduced labial seal;Reduced lingual movement/coordination;Impaired anterior to posterior transit Oral Phase Functional Implications: Right anterior spillage;Prolonged oral transit;Oral residue Pharyngeal  Phase Impairments: Suspected delayed Swallow;Decreased hyoid-laryngeal movement;Cough - Immediate    Nectar Thick Nectar Thick Liquid: Impaired Presentation: Cup;Self Fed Oral Phase Impairments: Reduced labial seal;Reduced lingual movement/coordination;Impaired anterior to posterior transit Oral phase functional implications: Right anterior spillage;Left anterior spillage;Oral residue;Prolonged oral transit Pharyngeal Phase Impairments: Suspected delayed Swallow;Decreased hyoid-laryngeal movement   Honey Thick Honey Thick Liquid: Not tested   Puree Puree:  Impaired Presentation: Self Fed;Spoon Oral Phase Impairments: Reduced labial seal;Reduced lingual movement/coordination;Impaired anterior to posterior transit Oral Phase Functional Implications: Prolonged oral transit;Oral residue Pharyngeal Phase Impairments: Decreased hyoid-laryngeal movement   Solid Solid: Impaired Presentation: Self Fed Oral Phase Impairments: Reduced labial seal;Reduced lingual movement/coordination;Impaired anterior to posterior transit Oral Phase Functional Implications: Left anterior spillage;Left lateral sulci pocketing;Oral residue Other Comments: pt used finger to push cracker crumbs to interdental position, could not form bolus for transit    Shakeela Rabadan, [employer] 07/25/2011,2:39 PM

## 2011-07-25 NOTE — Progress Notes (Signed)
INITIAL ADULT NUTRITION ASSESSMENT Date: 07/25/2011   Time: 9:09 AM Reason for Assessment: Nutrition Risk  ASSESSMENT: Female 71 y.o.  Dx: TIA (transient ischemic attack)  Hx:  Past Medical History  Diagnosis Date  . 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 Communicates by writing   . Hypertension   . Hypercholesteremia   . Complication of anesthesia     lung   . Angina   . GERD (gastroesophageal reflux disease)   . Myocardial infarction     CABG 2001, ? stent. Cath 02/2011 with new distal LIMA-LAD 80%. SVG-OM occlusion is old, rest of grafts patent  . Peripheral vascular disease   . Diabetes mellitus   . H/O hiatal hernia    Related Meds:     . sodium chloride   Intravenous Once  . aspirin  325 mg Oral Daily  . atorvastatin  80 mg Oral q1800  . carvedilol  6.25 mg Oral Q breakfast  . clopidogrel  75 mg Oral Q breakfast  . dextrose  25 mL Intravenous Once  . dextrose      . enoxaparin  40 mg Subcutaneous Q24H  . escitalopram  20 mg Oral Daily  . hydrochlorothiazide  25 mg Oral Daily  . lisinopril  20 mg Oral Daily  . DISCONTD: aspirin  325 mg Oral Daily  . DISCONTD: dextrose  25 mg Intravenous Once    Ht: 5\' 5"  (165.1 cm)  Wt: 180 lb 5.4 oz (81.8 kg)  Ideal Wt: 56.8 kg % Ideal Wt: 144%  Usual Wt:  Wt Readings from Last 10 Encounters:  07/24/11 180 lb 5.4 oz (81.8 kg)  03/13/11 174 lb (78.926 kg)  03/13/11 174 lb (78.926 kg)  03/13/11 174 lb (78.926 kg)   % Usual Wt: >100%  Body mass index is 30.01 kg/(m^2). Obesity Class   Food/Nutrition Related Hx: pt is aphasic but is able to communicate by writing. Pt able to answer all questions by writing this am. Pt denies wt loss PTA, had been able to tolerate a po diet PTA. Pt's mouth remains open, per MD note pt is unable to move her jaw. SLP has not yet seen pt. Per MD notes, if pt remains unable to swallow may need PEG tube.  Labs:  CMP     Component Value Date/Time   NA 144  07/24/2011 1552   K 3.8 07/24/2011 1552   CL 107 07/24/2011 1552   CO2 27 07/24/2011 1533   GLUCOSE 85 07/24/2011 1552   BUN 15 07/24/2011 1552   CREATININE 1.00 07/24/2011 1552   CALCIUM 9.8 07/24/2011 1533   PROT 7.3 07/24/2011 1533   ALBUMIN 3.6 07/24/2011 1533   AST 18 07/24/2011 1533   ALT 17 07/24/2011 1533   ALKPHOS 68 07/24/2011 1533   BILITOT 0.3 07/24/2011 1533   GFRNONAA 69* 07/24/2011 1533   GFRAA 80* 07/24/2011 1533   CBG (last 3)   Basename 07/24/11 2028 07/24/11 1929 07/24/11 1607  GLUCAP 119* 63* 77    Lab Results  Component Value Date   HGBA1C 7.7* 03/10/2011   Lipid Panel     Component Value Date/Time   CHOL 209* 07/25/2011 0625   TRIG 92 07/25/2011 0625   HDL 51 07/25/2011 0625   CHOLHDL 4.1 07/25/2011 0625   VLDL 18 07/25/2011 0625   LDLCALC 140* 07/25/2011 0625  No intake or output data in the 24 hours ending 07/25/11 0911   Diet Order: NPO  Supplements/Tube Feeding:  IVF: NA  Estimated Nutritional Needs:   Kcal: 1450-1650 Protein: 71-85 grams Fluid: >1.5 L/day  NUTRITION DIAGNOSIS: -Inadequate oral intake (NI-2.1).  Status: Ongoing  RELATED TO: inability to eat  AS EVIDENCE BY: NPO status  MONITORING/EVALUATION(Goals): Goal: Provide >/= 90% of pt's estimated needs Monitor: swallow ability, weight, plan of care.  EDUCATION NEEDS: -No education needs identified at this time  INTERVENTION:  If unable to pass swallow evaulation recommend initiating Jevity 1.2 @ 20 ml/hr and increasing by 10 ml every 4 hours to goal rate of 50 ml/hr.  30 ml Prostat daily  At goal will provide 1540 kcal, 81 grams protein, 972 ml H2O  Recommend additional 150 ml H2O QID; total free water: 1572 ml.  Dietitian #:[phone]  DOCUMENTATION CODES Per approved criteria  -Not Applicable    Kendell Bane Cornelison 07/25/2011, 9:09 AM

## 2011-07-25 NOTE — Consult Note (Signed)
Reason for Consult: progressive right sided weakness  Referring Physician: Dr. Waymon Amato  CC: difficulty in swallowing, right foot hard to move  HPI: [name] [name] is a left handed 71 y.o. female admitted with inability to swallow food and right foot weakness. She has been able to swallow with pureed foods since her initial stroke. She presented with inability to swallow and loss of total tongue use. Now she has right foot drag, new. At home she was living independently, is able to pivot to use a scooter chair and communicates with writing.  She was taking plavix PTA.   She has a history of stroke with right body weakness although she has some residual strength, right arm contracture. 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 New MRI reveals a small acute left hemisphere white matter infarct with no evidence of hemorrhage.     Past Medical History  Diagnosis Date  . 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 Communicates by writing   . Hypertension   . Hypercholesteremia   . Complication of anesthesia     lung   . Angina   . GERD (gastroesophageal reflux disease)   . Myocardial infarction     CABG 2001, ? stent. Cath 02/2011 with new distal LIMA-LAD 80%. SVG-OM occlusion is old, rest of grafts patent  . Peripheral vascular disease   . Diabetes mellitus   . H/O hiatal hernia     Past Surgical History  Procedure Date  . Coronary artery bypass graft   . Abdominal hysterectomy   . Breast biopsy   . Esophagogastroduodenoscopy 03/12/2011    Procedure: ESOPHAGOGASTRODUODENOSCOPY (EGD);  Surgeon: Theda Belfast, MD;  Location: [facility] ENDOSCOPY;  Service: Endoscopy;  Laterality: N/A;  . Cardiac catheterization 02/2011    Family History  Problem Relation Age of Onset  . Anesthesia problems Neg Hx   . Hypotension Neg Hx   . Malignant hyperthermia Neg Hx    . Pseudochol deficiency Neg Hx     Social History:  No history of tobacco, etoh or illicit drug use.  No Known Allergies  Medications:  Scheduled:   . sodium chloride   Intravenous Once  . aspirin  325 mg Oral Daily  . atorvastatin  80 mg Oral q1800  . carvedilol  6.25 mg Oral Q breakfast  . clopidogrel  75 mg Oral Q breakfast  . dextrose  25 mL Intravenous Once  . dextrose      . enoxaparin  40 mg Subcutaneous Q24H  . escitalopram  20 mg Oral Daily  . hydrochlorothiazide  25 mg Oral Daily  . lisinopril  20 mg Oral Daily  . DISCONTD: aspirin  325 mg Oral Daily  . DISCONTD: dextrose  25 mg Intravenous Once    ROS: History obtained from chart review and the patient  General ROS: negative for - chills, fatigue, fever, night sweats, weight gain or weight loss Psychological ROS: negative for - behavioral disorder, hallucinations, memory difficulties, mood swings or suicidal ideation Ophthalmic ROS: negative for - blurry vision, double vision, eye pain or loss of vision ENT ROS: negative for - epistaxis, nasal discharge, oral lesions, sore throat, tinnitus or vertigo Allergy and Immunology ROS: negative for - hives or itchy/watery eyes Hematological and Lymphatic ROS: negative for - bleeding problems, bruising or swollen lymph nodes Endocrine ROS: negative for - galactorrhea, hair pattern changes, polydipsia/polyuria or temperature intolerance Respiratory  ROS: negative for - cough, hemoptysis, shortness of breath or wheezing Cardiovascular ROS: negative for - chest pain, dyspnea on exertion, edema or irregular heartbeat Gastrointestinal ROS: negative for - abdominal pain, diarrhea, hematemesis, nausea/vomiting or stool incontinence Genito-Urinary ROS: negative for - dysuria, hematuria, incontinence or urinary frequency/urgency Musculoskeletal ROS: negative for - joint swelling, has right body muscular weakness from previous stroke Neurological ROS: as noted in HPI Dermatological  ROS: negative for rash and skin lesion changes   Physical Examination: Blood pressure 108/62, pulse 70, temperature 98.5 F (36.9 C), temperature source Oral, resp. rate 18, height 5\' 5"  (1.651 m), weight 81.8 kg (180 lb 5.4 oz), SpO2 98.00%.  Neurologic Examination Mental Status: Alert, oriented, thought content appropriate. Aphasia.  Able to follow 3 step commands without difficulty. Cranial Nerves: II: visual fields grossly normal, pupils equal, round, reactive to light and accommodation III,IV, VI: ptosis not present, extra-ocular motions intact bilaterally V,VII: smile asymmetric, facial light touch sensation normal bilaterally VIII: hearing normal bilaterally XI: trapezius strength/neck flexion strength normal bilaterally XII: tongue strength abnormal, unable to masticate and swallow food  Motor: Right : Upper extremity   2/5    Left:     Upper extremity   5/5  Lower extremity   2/5     Lower extremity   5/5 Some atrophy noted right extremities Sensory: Pinprick and light touch intact throughout, bilaterally Deep Tendon Reflexes: 2+ and symmetric throughout Plantars: mute Cerabellar: abnormal gait. She has weakness of right foot and cannot stand without assistance to pivot onto chair.     Results for orders placed during the hospital encounter of 07/24/11 (from the past 48 hour(s))  PROTIME-INR     Status: Normal   Collection Time   07/24/11  3:33 PM      Component Value Range Comment   Prothrombin Time 13.0  11.6 - 15.2 (seconds)    INR 0.96  0.00 - 1.49    APTT     Status: Normal   Collection Time   07/24/11  3:33 PM      Component Value Range Comment   aPTT 26  24 - 37 (seconds)   CBC     Status: Normal   Collection Time   07/24/11  3:33 PM      Component Value Range Comment   WBC 8.0  4.0 - 10.5 (K/uL)    RBC 4.79  3.87 - 5.11 (MIL/uL)    Hemoglobin 12.6  12.0 - 15.0 (g/dL)    HCT 11.9  14.7 - 82.9 (%)    MCV 82.3  78.0 - 100.0 (fL)    MCH 26.3  26.0 - 34.0 (pg)     MCHC 32.0  30.0 - 36.0 (g/dL)    RDW 56.2  13.0 - 86.5 (%)    Platelets 177  150 - 400 (K/uL)   DIFFERENTIAL     Status: Normal   Collection Time   07/24/11  3:33 PM      Component Value Range Comment   Neutrophils Relative 54  43 - 77 (%)    Neutro Abs 4.3  1.7 - 7.7 (K/uL)    Lymphocytes Relative 35  12 - 46 (%)    Lymphs Abs 2.8  0.7 - 4.0 (K/uL)    Monocytes Relative 9  3 - 12 (%)    Monocytes Absolute 0.7  0.1 - 1.0 (K/uL)    Eosinophils Relative 1  0 - 5 (%)    Eosinophils Absolute 0.1  0.0 - 0.7 (  K/uL)    Basophils Relative 0  0 - 1 (%)    Basophils Absolute 0.0  0.0 - 0.1 (K/uL)   COMPREHENSIVE METABOLIC PANEL     Status: Abnormal   Collection Time   07/24/11  3:33 PM      Component Value Range Comment   Sodium 141  135 - 145 (mEq/L)    Potassium 3.7  3.5 - 5.1 (mEq/L)    Chloride 106  96 - 112 (mEq/L)    CO2 27  19 - 32 (mEq/L)    Glucose, Bld 84  70 - 99 (mg/dL)    BUN 14  6 - 23 (mg/dL)    Creatinine, Ser 9.62  0.50 - 1.10 (mg/dL)    Calcium 9.8  8.4 - 10.5 (mg/dL)    Total Protein 7.3  6.0 - 8.3 (g/dL)    Albumin 3.6  3.5 - 5.2 (g/dL)    AST 18  0 - 37 (U/L)    ALT 17  0 - 35 (U/L)    Alkaline Phosphatase 68  39 - 117 (U/L)    Total Bilirubin 0.3  0.3 - 1.2 (mg/dL)    GFR calc non Af Amer 69 (*) >90 (mL/min)    GFR calc Af Amer 80 (*) >90 (mL/min)   CK TOTAL AND CKMB     Status: Abnormal   Collection Time   07/24/11  3:34 PM      Component Value Range Comment   Total CK 155  7 - 177 (U/L)    CK, MB 4.0  0.3 - 4.0 ([name]/mL)    Relative Index 2.6 (*) 0.0 - 2.5    TROPONIN I     Status: Abnormal   Collection Time   07/24/11  3:34 PM      Component Value Range Comment   Troponin I 1.22 (*) <0.30 ([name]/mL)   POCT I-STAT, CHEM 8     Status: Normal   Collection Time   07/24/11  3:52 PM      Component Value Range Comment   Sodium 144  135 - 145 (mEq/L)    Potassium 3.8  3.5 - 5.1 (mEq/L)    Chloride 107  96 - 112 (mEq/L)    BUN 15  6 - 23 (mg/dL)    Creatinine,  Ser 9.52  0.50 - 1.10 (mg/dL)    Glucose, Bld 85  70 - 99 (mg/dL)    Calcium, Ion 8.41  1.12 - 1.32 (mmol/L)    TCO2 27  0 - 100 (mmol/L)    Hemoglobin 13.6  12.0 - 15.0 (g/dL)    HCT 32.4  40.1 - 02.7 (%)   GLUCOSE, CAPILLARY     Status: Normal   Collection Time   07/24/11  4:07 PM      Component Value Range Comment   Glucose-Capillary 77  70 - 99 (mg/dL)   GLUCOSE, CAPILLARY     Status: Abnormal   Collection Time   07/24/11  7:29 PM      Component Value Range Comment   Glucose-Capillary 63 (*) 70 - 99 (mg/dL)    Comment 1 Documented in Chart      Comment 2 Notify RN     GLUCOSE, CAPILLARY     Status: Abnormal   Collection Time   07/24/11  8:28 PM      Component Value Range Comment   Glucose-Capillary 119 (*) 70 - 99 (mg/dL)    Comment 1 Documented in Chart  Comment 2 Notify RN     MRSA PCR SCREENING     Status: Normal   Collection Time   07/25/11  1:44 AM      Component Value Range Comment   MRSA by PCR NEGATIVE  NEGATIVE    CARDIAC PANEL(CRET KIN+CKTOT+MB+TROPI)     Status: Abnormal   Collection Time   07/25/11  2:32 AM      Component Value Range Comment   Total CK 134  7 - 177 (U/L)    CK, MB 3.6  0.3 - 4.0 ([name]/mL)    Troponin I 1.25 (*) <0.30 ([name]/mL)    Relative Index 2.7 (*) 0.0 - 2.5    URINALYSIS, ROUTINE W REFLEX MICROSCOPIC     Status: Abnormal   Collection Time   07/25/11  5:26 AM      Component Value Range Comment   Color, Urine YELLOW  YELLOW     APPearance CLEAR  CLEAR     Specific Gravity, Urine 1.016  1.005 - 1.030     pH 6.5  5.0 - 8.0     Glucose, UA NEGATIVE  NEGATIVE (mg/dL)    Hgb urine dipstick NEGATIVE  NEGATIVE     Bilirubin Urine NEGATIVE  NEGATIVE     Ketones, ur 15 (*) NEGATIVE (mg/dL)    Protein, ur NEGATIVE  NEGATIVE (mg/dL)    Urobilinogen, UA 0.2  0.0 - 1.0 (mg/dL)    Nitrite NEGATIVE  NEGATIVE     Leukocytes, UA NEGATIVE  NEGATIVE  MICROSCOPIC NOT DONE ON URINES WITH NEGATIVE PROTEIN, BLOOD, LEUKOCYTES, NITRITE, OR GLUCOSE <1000  mg/dL.  LIPID PANEL     Status: Abnormal   Collection Time   07/25/11  6:25 AM      Component Value Range Comment   Cholesterol 209 (*) 0 - 200 (mg/dL)    Triglycerides 92  <161 (mg/dL)    HDL 51  >09 (mg/dL)    Total CHOL/HDL Ratio 4.1      VLDL 18  0 - 40 (mg/dL)    LDL Cholesterol 604 (*) 0 - 99 (mg/dL)   CARDIAC PANEL(CRET KIN+CKTOT+MB+TROPI)     Status: Abnormal   Collection Time   07/25/11  8:44 AM      Component Value Range Comment   Total CK 120  7 - 177 (U/L)    CK, MB 3.1  0.3 - 4.0 ([name]/mL)    Troponin I 0.92 (*) <0.30 ([name]/mL)    Relative Index 2.6 (*) 0.0 - 2.5    GLUCOSE, CAPILLARY     Status: Abnormal   Collection Time   07/25/11 10:01 AM      Component Value Range Comment   Glucose-Capillary 140 (*) 70 - 99 (mg/dL)     Recent Results (from the past 240 hour(s))  MRSA PCR SCREENING     Status: Normal   Collection Time   07/25/11  1:44 AM      Component Value Range Status Comment   MRSA by PCR NEGATIVE  NEGATIVE  Final     Ct Head Wo Contrast  07/24/2011  *RADIOLOGY REPORT*  Clinical Data: Increasing aphasia.  Stroke 10 years ago.  CT HEAD WITHOUT CONTRAST  Technique:  Contiguous axial images were obtained from the base of the skull through the vertex without contrast.  Comparison: None.  Findings: Patchy and more focal areas of low density in the white matter of both cerebral hemispheres.  The more focal areas involve the corona radiata, basal ganglia and internal capsules  bilaterally, compatible with old infarcts.  No visible brainstem abnormalities.  Mildly enlarged ventricles and subarachnoid spaces. No intracranial hemorrhage, mass lesion or CT evidence of acute infarction.  Small right maxillary sinus retention cyst. Unremarkable bones.  IMPRESSION:  1.  No acute abnormality. 2.  Old bilateral corona radiata infarcts extending into the basal ganglia and internal capsules. 3.  Moderate chronic small vessel white matter ischemic changes in both cerebral hemispheres. 4.   Mild atrophy.  Original Report Authenticated By: Darrol Angel, M.D.   Mri Brain Without Contrast  07/25/2011  *RADIOLOGY REPORT*  Clinical Data:  71 year old female with increasing abnormal speech. Remote history of stroke 10 years ago.  Comparison: Head CT without contrast 07/24/2011.  MRI HEAD WITHOUT CONTRAST  Technique: Multiplanar, multiecho pulse sequences of the brain and surrounding structures were obtained according to standard protocol without intravenous contrast.  Findings: 9 mm confluent area of restricted diffusion in the left corona radiata.  No mass effect or evidence of associated hemorrhage.  T2 and FLAIR hyperintensity here is superimposed on chronic confluent bilateral cerebral white matter signal abnormality, including evidence of previous bilateral white matter lacunar infarcts tracking into the internal capsules and thalami.  No other restricted diffusion. No midline shift, mass effect, or evidence of mass lesion.  No ventriculomegaly.  Bulky dural calcifications incidentally noted at the vertex.  Major intracranial vascular flow voids are preserved although the posterior circulation appears diminutive, see MRA findings below.  Additional chronic lacunar infarcts in the left cerebellar hemisphere.  Negative pituitary, cervicomedullary junction and visualized cervical spine.  Visualized orbit soft tissues are within normal limits.  Minor paranasal sinus mucosal thickening.  Mastoids are clear.  Normal bone marrow signal.  Negative scalp soft tissues.  IMPRESSION: 1.  Small acute left hemisphere white matter infarct, small vessel type.  No mass effect or hemorrhage. 2.  Underlying advanced chronic cerebral white matter infarcts. Chronic small lacunar infarcts in the cerebellum. 3.  Diminutive appearance of the posterior circulation, see MRA findings below.  MRA HEAD WITHOUT CONTRAST  Technique: Angiographic images of the Circle of Willis were obtained using MRA technique without   intravenous contrast.  Findings: This study is degraded by motion.  Absent antegrade flow in the distal vertebral arteries and proximal basilar artery.  Minimal intermittent antegrade flow signal in the mid and distal basilar (series 602 image 8).  Flow appears fairly preserved at the basilar tip where bilateral posterior communicating arteries are patent.  The superior cerebellar artery origins are patent. Fetal type PCA origins, no left P1 segment identified.  There is mild to moderate irregularity of the posterior communicating arteries and proximal PCA branches.  There is preserved bilateral distal PCA flow.  Antegrade flow in both ICA siphons.  ICA irregularity without hemodynamically significant stenosis.  Carotid termini are patent. Grossly normal ophthalmic and posterior communicating artery origins.  Patent MCA and ACA origins.  Major ACA branches are within normal limits.  Irregularity of both MCA M1 segment, particulate on the right.  This could be exacerbated by the degree of motion.  Both MCA bifurcations remain patent none the left. Major bilateral M2 branches appear patent.  IMPRESSION: 1.  Poor flow or occlusion of the distal vertebral arteries and proximal basilar artery. 2.  Distal basilar and bilateral PCA flow reconstituted from the posterior communicating arteries. 3.  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  No proximal or major anterior circulation branch occlusion.  Study discussed by  telephone with Dr. Debby Bud on 07/25/11 at 1343 hrs.  Original Report Authenticated By: Harley Hallmark, M.D.   Dg Chest Port 1 View  07/25/2011  *RADIOLOGY REPORT*  Clinical Data: History of stroke.  PORTABLE CHEST - 1 VIEW  Comparison: 03/10/2011  Findings: Single view of the chest was obtained.  Patient has median sternotomy wires.  Heart size is grossly stable.  Slightly prominent interstitial densities without frank pulmonary edema.  No focal  airspace disease.  Negative for pneumothorax.  IMPRESSION: Slightly prominent interstitial densities could represent low lung volumes versus mild vascular congestion.  No focal disease.  Original Report Authenticated By: Richarda Overlie, M.D.   Mr Maxine Glenn Head/brain Wo Cm  07/25/2011  *RADIOLOGY REPORT*  Clinical Data:  71 year old female with increasing abnormal speech. Remote history of stroke 10 years ago.  Comparison: Head CT without contrast 07/24/2011.  MRI HEAD WITHOUT CONTRAST  Technique: Multiplanar, multiecho pulse sequences of the brain and surrounding structures were obtained according to standard protocol without intravenous contrast.  Findings: 9 mm confluent area of restricted diffusion in the left corona radiata.  No mass effect or evidence of associated hemorrhage.  T2 and FLAIR hyperintensity here is superimposed on chronic confluent bilateral cerebral white matter signal abnormality, including evidence of previous bilateral white matter lacunar infarcts tracking into the internal capsules and thalami.  No other restricted diffusion. No midline shift, mass effect, or evidence of mass lesion.  No ventriculomegaly.  Bulky dural calcifications incidentally noted at the vertex.  Major intracranial vascular flow voids are preserved although the posterior circulation appears diminutive, see MRA findings below.  Additional chronic lacunar infarcts in the left cerebellar hemisphere.  Negative pituitary, cervicomedullary junction and visualized cervical spine.  Visualized orbit soft tissues are within normal limits.  Minor paranasal sinus mucosal thickening.  Mastoids are clear.  Normal bone marrow signal.  Negative scalp soft tissues.  IMPRESSION: 1.  Small acute left hemisphere white matter infarct, small vessel type.  No mass effect or hemorrhage. 2.  Underlying advanced chronic cerebral white matter infarcts. Chronic small lacunar infarcts in the cerebellum. 3.  Diminutive appearance of the posterior  circulation, see MRA findings below.  MRA HEAD WITHOUT CONTRAST  Technique: Angiographic images of the Circle of Willis were obtained using MRA technique without  intravenous contrast.  Findings: This study is degraded by motion.  Absent antegrade flow in the distal vertebral arteries and proximal basilar artery.  Minimal intermittent antegrade flow signal in the mid and distal basilar (series 602 image 8).  Flow appears fairly preserved at the basilar tip where bilateral posterior communicating arteries are patent.  The superior cerebellar artery origins are patent. Fetal type PCA origins, no left P1 segment identified.  There is mild to moderate irregularity of the posterior communicating arteries and proximal PCA branches.  There is preserved bilateral distal PCA flow.  Antegrade flow in both ICA siphons.  ICA irregularity without hemodynamically significant stenosis.  Carotid termini are patent. Grossly normal ophthalmic and posterior communicating artery origins.  Patent MCA and ACA origins.  Major ACA branches are within normal limits.  Irregularity of both MCA M1 segment, particulate on the right.  This could be exacerbated by the degree of motion.  Both MCA bifurcations remain patent none the left. Major bilateral M2 branches appear patent.  IMPRESSION: 1.  Poor flow or occlusion of the distal vertebral arteries and proximal basilar artery. 2.  Distal basilar and bilateral PCA flow reconstituted from the posterior communicating arteries. 3.  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  No proximal or major anterior circulation branch occlusion.  Study discussed by telephone with Dr. Debby Bud on 07/25/11 at 1343 hrs.  Original Report Authenticated By: Harley Hallmark, M.D.     Assessment/Plan:  70 yo left handed female, history of previous cerebral white matter and lacunar cerebellum infarcts. Now presenting with right foot drop and inability to  swallow. Case reviewed with Dr. Roseanne Reno.  1) Acute left hemisphere white matter infarct with increased defects in tongue/right foot strength from previous cerebellar infarcts .  -PT,OT, ST .  -Start aggrenox and discontinue ASA 325 + Plavix   -2D Echo and Carotid Dopplers Pending.  -HA1c and fasting lipid panel    Guy Franco [facility] Pager [phone]   07/25/2011, 2:36 PM

## 2011-07-25 NOTE — Care Management Note (Signed)
    Page 1 of 1   07/25/2011     12:42:48 PM   CARE MANAGEMENT NOTE 07/25/2011  Patient:  [name] [name],[name] [name]   Account Number:  [id number]  Date Initiated:  07/25/2011  Documentation initiated by:  Onnie Boer  Subjective/Objective Assessment:   PT WAS ADMITTED WITH WEAKNESS AND FOOT DROP     Action/Plan:   PROGRESSION OF CARE AND DISCHARGE PLANNING   Anticipated DC Date:  07/28/2011   Anticipated DC Plan:  ASSISTED LIVING / REST HOME  In-house referral  Clinical Social Worker      DC Planning Services  CM consult      Choice offered to / List presented to:             Status of service:  In process, will continue to follow Medicare Important Message given?   (If response is "NO", the following Medicare IM given date fields will be blank) Date Medicare IM given:   Date Additional Medicare IM given:    Discharge Disposition:    Per UR Regulation:  Reviewed for med. necessity/level of care/duration of stay  If discussed at Long Length of Stay Meetings, dates discussed:    Comments:  07/25/11 Onnie Boer, RN, BSN 1240 PT WAS ADMITTED WITH WEAKNESS.  PTA PT WAS AT AN ALF.  PT PLANS TO RETURN TO ALF.  PT HAS AN AIDE WITH [employer] THAT COMES OUT 3HRS/ X 3 DAYS A WK.  PT COMMUNICATES BY WRITTING SINCE LAST CVA IN 2003. WILL F/U ON DC NEEDS.

## 2011-07-25 NOTE — Progress Notes (Signed)
VASCULAR LAB PRELIMINARY  PRELIMINARY  PRELIMINARY  PRELIMINARY  Carotid duplex  completed.    Preliminary report:  Bilateral:  No evidence of hemodynamically significant internal carotid artery stenosis.   Vertebral artery flow is antegrade.      Terance Hart, RVT 07/25/2011, 4:54 PM

## 2011-07-25 NOTE — Evaluation (Signed)
Occupational Therapy Evaluation Patient Details Name: [name] [name] MRN: [id number] DOB: 1941/02/02 Today's Date: 07/25/2011 Time: 4098-1191 OT Time Calculation (min): 45 min  OT Assessment / Plan / Recommendation Clinical Impression  This 71 y.o. female admitted with new onset difficulties eating due to jaw/mouth weakness and Rt. foot weakness.  Pt. with new small acute hemisphere CVA in the area of the corona radiata.  Pt. with h/o old CVA with  profound Rt. UE and communication deficits.  Pt. was functioning  modified independently at home (lived in independent living).  She primarily utilized a power wheelchair for [employer] and has all DME as well as is proficient with one handed techniques and adaptations to allow her to function modified independently.  Pt. performed ADLs with supervision this afternoon, and appears to be at baseline level of functioning.  However, feel she would benefit from continued OT to assist with adaptations for meal prep if she requires a diet change.   Pt. does report that she blends most of her meals in a blender.  Also recommend HHOT to ensure pt. returns fully to baseline level at home.      OT Assessment  Patient needs continued OT Services    Follow Up Recommendations  Home health OT;Supervision - Intermittent    Barriers to Discharge Decreased caregiver support    Equipment Recommendations  None recommended by OT    Recommendations for Other Services    Frequency  Min 2X/week    Precautions / Restrictions Precautions Precautions: Fall Required Braces or Orthoses: Other Brace/Splint Other Brace/Splint: R AFO Restrictions Weight Bearing Restrictions: No       ADL  Eating/Feeding: Simulated;Supervision/safety Where Assessed - Eating/Feeding: Bed level Grooming: Simulated;Wash/dry face;Wash/dry hands;Set up Where Assessed - Grooming: Supported sitting Upper Body Bathing: Simulated;Set up Where Assessed - Upper Body Bathing: Supported  sitting Lower Body Bathing: Simulated;Supervision/safety Where Assessed - Lower Body Bathing: Supported sit to stand Upper Body Dressing: Performed;Set up (gown - utilizing hemi technique) Where Assessed - Upper Body Dressing: Unsupported sitting Lower Body Dressing: Performed;Supervision/safety Where Assessed - Lower Body Dressing: Sopported sit to stand Toilet Transfer: Simulated;Supervision/safety Toilet Transfer Method: Stand pivot Acupuncturist: Raised toilet seat with arms (or 3-in-1 over toilet) Toileting - Clothing Manipulation and Hygiene: Performed;Supervision/safety Where Assessed - Glass blower/designer Manipulation and Hygiene: Standing Equipment Used:  (double upright AFO; grab bars) Transfers/Ambulation Related to ADLs: Pt. transfers with supervision in new environment.  Appears to be at baseline level of functioning ADL Comments: Pt. appears to be at baseline level of functioning with ADLs.  She if very proficient with one handed techniques and adaptations    OT Diagnosis: Generalized weakness;Hemiplegia non-dominant side  OT Problem List: Decreased strength;Decreased range of motion;Impaired balance (sitting and/or standing);Decreased coordination;Impaired tone;Impaired UE functional use OT Treatment Interventions: Self-care/ADL training;Neuromuscular education;DME and/or AE instruction;Therapeutic activities;Patient/family education;Balance training   OT Goals Acute Rehab OT Goals OT Goal Formulation: With patient Time For Goal Achievement: 08/01/11 Potential to Achieve Goals: Good ADL Goals Pt Will Perform Tub/Shower Transfer: with supervision;with DME;Shower seat with back;Stand pivot transfer ADL Goal: Web designer - Progress: Goal set today Additional ADL Goal #1: Pt. will be supervision with simple to moderately complex meal prep from a wheelchair, sit to stand level ADL Goal: Additional Goal #1 - Progress: Goal set today  Visit Information  Last  OT Received On: 07/25/11 Assistance Needed: +1 PT/OT Co-Evaluation/Treatment: Yes    Subjective Data      Prior Functioning  Home Living Lives With:  Alone Available Help at Discharge: Personal care attendant (Aide 3 days/wk for 3hrs each.  ) Type of Home: Independent living facility [facility]) Home Access: Level entry Home Layout: One level Bathroom Shower/Tub: Engineer, manufacturing systems: Standard Bathroom Accessibility: Yes How Accessible: Accessible via wheelchair Home Adaptive Equipment: Wheelchair - powered;Walker - rolling;Shower chair with back;Raised toilet seat with rails;Grab bars in shower;Quad cane;Hospital bed (life alert) Prior Function Level of Independence: Needs assistance Needs Assistance: Light Housekeeping (Aide helps with cleaning and laundry) Able to Take Stairs?: No Driving: No Comments: pt uses [employer] for transportation to groceries or daughter drives.   Communication Communication: Expressive difficulties (pt writes for communication) Dominant Hand: Left    Cognition  Overall Cognitive Status: Appears within functional limits for tasks assessed/performed Arousal/Alertness: Awake/alert Orientation Level: Oriented X4 / Intact Behavior During Session: WFL for tasks performed Cognition - Other Comments: Pt. 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 UE function Left Upper Extremity Assessment LUE ROM/Strength/Tone: Within functional levels LUE Sensation: WFL - Light Touch LUE Coordination: WFL - gross/fine motor Right Lower Extremity Assessment RLE ROM/Strength/Tone: Deficits RLE ROM/Strength/Tone Deficits: pt with history R hemi and now notes increased weakness in R LE Hip/Knee 3+/5, Ankle 2/5 RLE Sensation: WFL - Light Touch Left Lower Extremity  Assessment LLE ROM/Strength/Tone: WFL for tasks assessed LLE Sensation: WFL - Light Touch   Mobility Bed Mobility Bed Mobility: Supine to Sit;Sitting - Scoot to Edge of Bed Supine to Sit: 6: Modified independent (Device/Increase time);With rails Sitting - Scoot to Edge of Bed: 6: Modified independent (Device/Increase time) Details for Bed Mobility Assistance: pt has hospital bed at home and demo'd home technique for supine to sit.  pt needs increased time, but able to complete Mod I.   Transfers Sit to Stand: 5: Supervision;With upper extremity assist;From bed;From chair/3-in-1 Stand to Sit: 5: Supervision;With upper extremity assist;To chair/3-in-1;With armrests Details for Transfer Assistance: pt demo'd how she transfers bed to chair using armrests and performed sit to stand using bedrail to don brief demo'ing home technique.  pt with mild buckling R knee, which pt notes is new.  pt able to maintain balance despite buckling, however with decreased safety.     Exercise    Balance Balance Balance Assessed: No  End of Session OT - End of Session Equipment Utilized During Treatment: Right ankle foot orthosis Activity Tolerance: Patient tolerated treatment well Patient left: in chair;with call bell/phone within reach Nurse Communication: Mobility status   Rylynn Kobs, Ursula Alert M 07/25/2011, 3:37 PM

## 2011-07-25 NOTE — Consult Note (Signed)
CARDIOLOGY CONSULT NOTE  Patient ID: [name] [name] MRN: [id number] DOB/AGE: 04-25-1940 71 y.o.  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 She sat down for breakfast and realized that she was unable to eat and also realized that she was unable to grunt. Patient makes noises protecting and communicates. Because of this she presented to the hospital for further evaluation. She had no chest pain, shortness breath, PND or orthopnea. Prior to this episode she had been doing well. Patient had increased admitted to the hospital sometime in January of 2012 with chest pain. I felt the chest pain was probably angina pectoris. The due to her comorbidities we had recommended medical therapy and she did undergone outpatient stress test which had revealed moderate amount of ischemia in the inferolateral wall. But with aggressive medical therapy her chest then it completely subsided and she had been doing well.   Past Medical History  Diagnosis Date  . 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 Communicates by writing   . Hypertension   . Hypercholesteremia   . Complication of anesthesia     lung   . Angina   . GERD (gastroesophageal reflux disease)   . Myocardial infarction     CABG 2001, ? stent. Cath 02/2011 with new distal LIMA-LAD 80%. SVG-OM occlusion is old, rest of grafts patent  . Peripheral vascular disease   . Diabetes mellitus   . H/O hiatal hernia      Past Surgical History  Procedure Date  . Coronary artery bypass graft   . Abdominal hysterectomy   . Breast biopsy   . Esophagogastroduodenoscopy 03/12/2011    Procedure:  ESOPHAGOGASTRODUODENOSCOPY (EGD);  Surgeon: Theda Belfast, MD;  Location: [facility];  Service: Endoscopy;  Laterality: N/A;  . Cardiac catheterization 02/2011     Family History  Problem Relation Age of Onset  . Anesthesia problems Neg Hx   . Hypotension Neg Hx   . Malignant hyperthermia Neg Hx   . Pseudochol deficiency Neg Hx     Social History: History   Social History  . Marital Status: Widowed    Spouse Name: N/A    Number of Children: N/A  . Years of Education: N/A   Occupational History  . Not on file.   Social History Main Topics  . Smoking status: Never Smoker   . Smokeless tobacco: Not on file  . Alcohol Use: No  . Drug Use: No  . Sexually Active: No   Other Topics Concern  . Not on file   Social History Narrative  . No narrative on file     Prescriptions prior to admission  Medication Sig Dispense Refill  . carvedilol (COREG) 6.25 MG tablet Take 6.25 mg by mouth daily.      . clopidogrel (PLAVIX) 75 MG tablet Take 75 mg by mouth daily.        Marland Kitchen escitalopram (LEXAPRO) 20 MG tablet Take 20 mg by mouth daily.      . hydrochlorothiazide (HYDRODIURIL) 25 MG tablet Take 25 mg by mouth daily.        . insulin aspart protamine-insulin aspart (NOVOLOG 70/30) (70-30) 100 UNIT/ML injection Inject 28-36  Units into the skin 2 (two) times daily. 36 units in AM and 28 units in PM      . lisinopril (PRINIVIL,ZESTRIL) 20 MG tablet Take 20 mg by mouth daily.      . nitroGLYCERIN (NITROSTAT) 0.4 MG SL tablet Place 0.4 mg under the tongue every 5 (five) minutes as needed.      . rosuvastatin (CRESTOR) 40 MG tablet Take 40 mg by mouth at bedtime.          ROS: General: no fevers/chills/night sweats Eyes: no blurry vision, diplopia, or amaurosis  Resp: no cough, wheezing, or hemoptysis CV: No chest pain, palpitations, edema of legs, PND or orthopnea. GI: no abdominal pain, nausea, vomiting, diarrhea, or constipation. Had difficulty swallowing yesterday.  GU: no dysuria,  frequency, or hematuria Heme: no bleeding, DVT, or easy bruising Endo: no polydipsia or polyuria, DM well controlled.    Physical Exam: Blood pressure 115/63, pulse 73, temperature 98.7 F (37.1 C), temperature source Oral, resp. rate 18, height 5\' 5"  (1.651 m), weight 81.8 kg (180 lb 5.4 oz), SpO2 98.00%.  Pt is alert and oriented, WD, WN, in no distress. HEENT: normal Neck: JVP normal. Carotid upstrokes normal without bruits. No thyromegaly. Lungs: equal expansion, clear bilaterally CV: Apex is discrete and nondisplaced, RRR without murmur or gallop Abd: soft, NT, +BS, no bruit, no hepatosplenomegaly Neurologic exam: Patient has dense hemiplegia on the right. She has dysarthria but communicates by writing left hand. She also grunts.   Labs:   Lab Results  Component Value Date   WBC 8.0 07/24/2011   HGB 13.6 07/24/2011   HCT 40.0 07/24/2011   MCV 82.3 07/24/2011   PLT 177 07/24/2011    Lab 07/24/11 1552 07/24/11 1533  NA 144 --  K 3.8 --  CL 107 --  CO2 -- 27  BUN 15 --  CREATININE 1.00 --  CALCIUM -- 9.8  PROT -- 7.3  BILITOT -- 0.3  ALKPHOS -- 68  ALT -- 17  AST -- 18  GLUCOSE 85 --    Cardiac Panel (last 3 results)  Basename 07/25/11 1534 07/25/11 0844 07/25/11 0232  CKTOTAL 124 120 134  CKMB 3.1 3.1 3.6  TROPONINI 0.87* 0.92* 1.25*  RELINDX 2.5 2.6* 2.7*   Lipid Panel     Component Value Date/Time   CHOL 209* 07/25/2011 0625   TRIG 92 07/25/2011 0625   HDL 51 07/25/2011 0625   CHOLHDL 4.1 07/25/2011 0625   VLDL 18 07/25/2011 0625   LDLCALC 140* 07/25/2011 0625        IMPRESSION:  1.  No acute abnormality. 2.  Old bilateral corona radiata infarcts extending into the basal ganglia and internal capsules. 3.  Moderate chronic small vessel white matter ischemic changes in both cerebral hemispheres. 4.  Mild atrophy.  MRI: 1. Small acute left hemisphere white matter infarct, small vessel type. No mass effect or hemorrhage.  2. Underlying advanced chronic cerebral  white matter infarcts.  Chronic small lacunar infarcts in the cerebellum.  3. Diminutive appearance of the posterior circulation    EKG: Normal sinus rhythm with rate of 61 beats per minute. Normal axis. Left hypertrophy. ST depression of 1 mm with T wave inversion noted in 1 and aVL and V5 V6 suggestive of subendocardial myocardial infarction. These changes are new compared to January of 2013 EKG.  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 She sat down for breakfast and realized that she was unable to eat and also realized that she was unable to grunt. Patient makes noises protecting and communicates. Because of this she presented to the hospital for further evaluation. She had no chest pain, shortness breath, PND or orthopnea. Prior to this episode she had been doing well.  1. Non-ST elevation myocardial infarction involving lateral wall. New EKG changes are evident. Patient has had outpatient stress test on 05/17/2010, and this had revealed small to moderate sized severe basal inferolateral ischemia. Patient has had chest pain prior to the episode of stress test and due to her comorbidities was aggressively treated medically and she had complete resolution of chest pain and hence was treated medically and continued with same. She had done well until this admission where she is new EKG changes that had not noted previously. 2. Coronary artery disease status post CABG in [place] in 2001 and? Stent in 2001 after CABG. 3. Diabetes mellitus type 2 controlled 4. Hyperlipidemia 5. Right-sided hemiplegia and dysarthria. Patient with stroke in 2002 and 2003. Now has a recurrent stroke. 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  Recommendation: I would recommend that  patient be on aspirin along with Plavix for non-ST elevation myocardial infarction and new EKG changes that are persistent. She has not had any chest pain with his myocardial infarction. No new MRI findings which are suggesting a new stroke, no aggressive measures including invasive strategy is advocated at this point. Patient was previously on aggressive medical therapy and had been doing well. She was on aspirin alone without any Plavix. Patient's LDL is still not at goal in spite of being on 40 mg of Crestor. Hence I would add Zetia to present medical therapy. Due to her cardiac risk profile I would prefer Plavix but however if neurology prefers to have patient on Aggrenox they may certainly change my order. Nothing further to contribute from cardiac standpoint. I will see her back on a when necessary basis unless she has any chest pain then I be happy to be involved in her care again. Please do not hesitate to contact me anytime with my pager number [phone] my cell phone [phone].    Pamella Pert, MD 07/25/2011, 6:37 PM

## 2011-07-25 NOTE — Progress Notes (Signed)
Subjective:   Chart reviewed. Patient writes to indicate that she is feeling better. She indicates improving strength in the right extremities. She is requesting something to eat.  Objective  Vital signs in last 24 hours: Filed Vitals:   07/25/11 0300 07/25/11 0500 07/25/11 1000 07/25/11 1400  BP: 115/67 103/61 108/62 110/66  Pulse: 69 64 70 72  Temp: 98.8 F (37.1 C) 98.7 F (37.1 C) 98.5 F (36.9 C) 98.6 F (37 C)  TempSrc: Oral Oral Oral Oral  Resp: 16 16 18 18   Height:      Weight:      SpO2: 94% 98% 98% 98%   Weight change:  No intake or output data in the 24 hours ending 07/25/11 1632  Physical Exam:  General Exam: Comfortable. Sitting on a reclining chair.  Respiratory System: Clear. No increased work of breathing.  Cardiovascular System: First and second heart sounds heard. Regular rate and rhythm. No JVD/murmurs. Telemetry shows sinus rhythm in the 60s.  Gastrointestinal System: Abdomen is non distended, soft and normal bowel sounds heard. Non-tender. Central Nervous System: Alert and oriented -writes with her left hand. A right facial droop with drooling of saliva on the right angle of the mouth. Aphasia. Extremities: Left upper and lower extremities: Grade 5/5 power. Right upper extremity proximally: Grade 5 x 5 power but this still is grade 0 x 1. Right lower extremity: Grade 2 x 5 power.  Labs:  Basic Metabolic Panel:  Lab 07/24/11 1610 07/24/11 1533  NA 144 141  K 3.8 3.7  CL 107 106  CO2 -- 27  GLUCOSE 85 84  BUN 15 14  CREATININE 1.00 0.84  CALCIUM -- 9.8  ALB -- --  PHOS -- --   Liver Function Tests:  Lab 07/24/11 1533  AST 18  ALT 17  ALKPHOS 68  BILITOT 0.3  PROT 7.3  ALBUMIN 3.6   No results found for this basename: LIPASE:3,AMYLASE:3 in the last 168 hours No results found for this basename: AMMONIA:3 in the last 168 hours CBC:  Lab 07/24/11 1552 07/24/11 1533  WBC -- 8.0  NEUTROABS -- 4.3  HGB 13.6 12.6  HCT 40.0 39.4  MCV --  82.3  PLT -- 177   Cardiac Enzymes:  Lab 07/25/11 0844 07/25/11 0232 07/24/11 1534  CKTOTAL 120 134 155  CKMB 3.1 3.6 4.0  CKMBINDEX -- -- --  TROPONINI 0.92* 1.25* 1.22*   CBG:  Lab 07/25/11 1529 07/25/11 1001 07/24/11 2028 07/24/11 1929 07/24/11 1607  GLUCAP 203* 140* 119* 63* 77    Iron Studies: No results found for this basename: IRON,TIBC,TRANSFERRIN,FERRITIN in the last 72 hours Studies/Results: Ct Head Wo Contrast  07/24/2011  *RADIOLOGY REPORT*  Clinical Data: Increasing aphasia.  Stroke 10 years ago.  CT HEAD WITHOUT CONTRAST  Technique:  Contiguous axial images were obtained from the base of the skull through the vertex without contrast.  Comparison: None.  Findings: Patchy and more focal areas of low density in the white matter of both cerebral hemispheres.  The more focal areas involve the corona radiata, basal ganglia and internal capsules bilaterally, compatible with old infarcts.  No visible brainstem abnormalities.  Mildly enlarged ventricles and subarachnoid spaces. No intracranial hemorrhage, mass lesion or CT evidence of acute infarction.  Small right maxillary sinus retention cyst. Unremarkable bones.  IMPRESSION:  1.  No acute abnormality. 2.  Old bilateral corona radiata infarcts extending into the basal ganglia and internal capsules. 3.  Moderate chronic small vessel white matter ischemic  changes in both cerebral hemispheres. 4.  Mild atrophy.  Original Report Authenticated By: Darrol Angel, M.D.   Mri Brain Without Contrast  07/25/2011  *RADIOLOGY REPORT*  Clinical Data:  71 year old female with increasing abnormal speech. Remote history of stroke 10 years ago.  Comparison: Head CT without contrast 07/24/2011.  MRI HEAD WITHOUT CONTRAST  Technique: Multiplanar, multiecho pulse sequences of the brain and surrounding structures were obtained according to standard protocol without intravenous contrast.  Findings: 9 mm confluent area of restricted diffusion in the left  corona radiata.  No mass effect or evidence of associated hemorrhage.  T2 and FLAIR hyperintensity here is superimposed on chronic confluent bilateral cerebral white matter signal abnormality, including evidence of previous bilateral white matter lacunar infarcts tracking into the internal capsules and thalami.  No other restricted diffusion. No midline shift, mass effect, or evidence of mass lesion.  No ventriculomegaly.  Bulky dural calcifications incidentally noted at the vertex.  Major intracranial vascular flow voids are preserved although the posterior circulation appears diminutive, see MRA findings below.  Additional chronic lacunar infarcts in the left cerebellar hemisphere.  Negative pituitary, cervicomedullary junction and visualized cervical spine.  Visualized orbit soft tissues are within normal limits.  Minor paranasal sinus mucosal thickening.  Mastoids are clear.  Normal bone marrow signal.  Negative scalp soft tissues.  IMPRESSION: 1.  Small acute left hemisphere white matter infarct, small vessel type.  No mass effect or hemorrhage. 2.  Underlying advanced chronic cerebral white matter infarcts. Chronic small lacunar infarcts in the cerebellum. 3.  Diminutive appearance of the posterior circulation, see MRA findings below.  MRA HEAD WITHOUT CONTRAST  Technique: Angiographic images of the Circle of Willis were obtained using MRA technique without  intravenous contrast.  Findings: This study is degraded by motion.  Absent antegrade flow in the distal vertebral arteries and proximal basilar artery.  Minimal intermittent antegrade flow signal in the mid and distal basilar (series 602 image 8).  Flow appears fairly preserved at the basilar tip where bilateral posterior communicating arteries are patent.  The superior cerebellar artery origins are patent. Fetal type PCA origins, no left P1 segment identified.  There is mild to moderate irregularity of the posterior communicating arteries and proximal PCA  branches.  There is preserved bilateral distal PCA flow.  Antegrade flow in both ICA siphons.  ICA irregularity without hemodynamically significant stenosis.  Carotid termini are patent. Grossly normal ophthalmic and posterior communicating artery origins.  Patent MCA and ACA origins.  Major ACA branches are within normal limits.  Irregularity of both MCA M1 segment, particulate on the right.  This could be exacerbated by the degree of motion.  Both MCA bifurcations remain patent none the left. Major bilateral M2 branches appear patent.  IMPRESSION: 1.  Poor flow or occlusion of the distal vertebral arteries and proximal basilar artery. 2.  Distal basilar and bilateral PCA flow reconstituted from the posterior communicating arteries. 3.  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  No proximal or major anterior circulation branch occlusion.  Study discussed by telephone with Dr. Debby Bud on 07/25/11 at 1343 hrs.  Original Report Authenticated By: Harley Hallmark, M.D.   Dg Chest Port 1 View  07/25/2011  *RADIOLOGY REPORT*  Clinical Data: History of stroke.  PORTABLE CHEST - 1 VIEW  Comparison: 03/10/2011  Findings: Single view of the chest was obtained.  Patient has median sternotomy wires.  Heart size is grossly stable.  Slightly  prominent interstitial densities without frank pulmonary edema.  No focal airspace disease.  Negative for pneumothorax.  IMPRESSION: Slightly prominent interstitial densities could represent low lung volumes versus mild vascular congestion.  No focal disease.  Original Report Authenticated By: Richarda Overlie, M.D.   2-D echocardiogram:  Study Conclusions  - Left ventricle: Wall thickness was increased in a pattern of mild LVH. There was concentric hypertrophy. Systolic function was normal. Doppler parameters are consistent with abnormal left ventricular relaxation (grade 1 diastolic dysfunction). Doppler parameters are  consistent with both elevated ventricular end-diastolic filling pressure and elevated left atrial filling pressure. - Ventricular septum: The outflow septum had a sigmoid appearance. - Aorta: The aorta was mildly diseased. I cannot exclude presence of complex arch atheroma. - Mitral valve: Calcified annulus. Mildly thickened leaflets. - Atrial septum: No defect or patent foramen ovale was identified.    Medications:      . sodium chloride   Intravenous Once  . atorvastatin  80 mg Oral q1800  . carvedilol  6.25 mg Oral Q breakfast  . dextrose  25 mL Intravenous Once  . dextrose      . dipyridamole-aspirin  1 capsule Oral BID  . enoxaparin  40 mg Subcutaneous Q24H  . escitalopram  20 mg Oral Daily  . hydrochlorothiazide  25 mg Oral Daily  . lisinopril  20 mg Oral Daily  . DISCONTD: aspirin  325 mg Oral Daily  . DISCONTD: aspirin  325 mg Oral Daily  . DISCONTD: clopidogrel  75 mg Oral Q breakfast  . DISCONTD: dextrose  25 mg Intravenous Once    I  have reviewed scheduled and prn medications.     Problem/Plan: Principal Problem:  *TIA (transient ischemic attack) Active Problems:  HTN (hypertension)  Elevated troponin I level  Diabetes mellitus  1. 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 Patient has prior multiple white matter infarcts. 2-D echo shows normal systolic function. Carotid Dopplers pending. Neurology consulted. Antiplatelet agents have been changed to Aggrenox. Continue Lipitor. Patient started on dysphagia 1 diet with nectar thick liquids as per speech therapy recommendations. 2. Hypertension: Reasonably controlled. Continue lisinopril, hydrochlorothiazide and carvedilol. 3. Elevated troponin:? Secondary to new stroke/demand ischemia. Has history of coronary artery disease status post CABG and non-STEMI in January 2013 status post cath-deemed for medical management. Change from aspirin and Plavix to Aggrenox  by neurology. Await cardiology input. Echo with normal systolic function. Continue carvedilol. 4. Type 2 diabetes mellitus: Had a hypoglycemic episode in the emergency department but now the blood sugars are greater than 200 mg/dL. We'll start sliding scale insulin. 5. Hyperlipidemia: Continue statins.  Harm Jou 07/25/2011,4:32 PM  LOS: 1 day

## 2011-07-26 ENCOUNTER — Inpatient Hospital Stay (HOSPITAL_COMMUNITY): Payer: Medicare HMO

## 2011-07-26 DIAGNOSIS — I214 Non-ST elevation (NSTEMI) myocardial infarction: Secondary | ICD-10-CM

## 2011-07-26 DIAGNOSIS — I10 Essential (primary) hypertension: Secondary | ICD-10-CM

## 2011-07-26 DIAGNOSIS — E1165 Type 2 diabetes mellitus with hyperglycemia: Secondary | ICD-10-CM

## 2011-07-26 DIAGNOSIS — I6340 Cerebral infarction due to embolism of unspecified cerebral artery: Secondary | ICD-10-CM

## 2011-07-26 DIAGNOSIS — G459 Transient cerebral ischemic attack, unspecified: Secondary | ICD-10-CM

## 2011-07-26 DIAGNOSIS — E118 Type 2 diabetes mellitus with unspecified complications: Secondary | ICD-10-CM

## 2011-07-26 LAB — URINALYSIS, ROUTINE W REFLEX MICROSCOPIC
Bilirubin Urine: NEGATIVE
Hgb urine dipstick: NEGATIVE
Protein, ur: NEGATIVE mg/dL
Urobilinogen, UA: 1 mg/dL (ref 0.0–1.0)

## 2011-07-26 LAB — GLUCOSE, CAPILLARY
Glucose-Capillary: 348 mg/dL — ABNORMAL HIGH (ref 70–99)
Glucose-Capillary: 346 mg/dL — ABNORMAL HIGH (ref 70–99)

## 2011-07-26 LAB — URINE MICROSCOPIC-ADD ON: Urine-Other: NONE SEEN

## 2011-07-26 MED ORDER — EZETIMIBE 10 MG PO TABS
10.0000 mg | ORAL_TABLET | Freq: Every day | ORAL | Status: DC
  Administered 2011-07-26 – 2011-07-27 (×2): 10 mg via ORAL
  Filled 2011-07-26 (×2): qty 1

## 2011-07-26 MED ORDER — INSULIN ASPART PROT & ASPART (70-30 MIX) 100 UNIT/ML ~~LOC~~ SUSP
20.0000 [IU] | Freq: Two times a day (BID) | SUBCUTANEOUS | Status: DC
  Administered 2011-07-26 – 2011-07-27 (×2): 20 [IU] via SUBCUTANEOUS
  Filled 2011-07-26: qty 3

## 2011-07-26 MED ORDER — ASPIRIN EC 81 MG PO TBEC
81.0000 mg | DELAYED_RELEASE_TABLET | Freq: Every day | ORAL | Status: DC
  Administered 2011-07-26 – 2011-07-27 (×2): 81 mg via ORAL
  Filled 2011-07-26 (×2): qty 1

## 2011-07-26 MED ORDER — INSULIN ASPART 100 UNIT/ML ~~LOC~~ SOLN
4.0000 [IU] | Freq: Once | SUBCUTANEOUS | Status: AC
  Administered 2011-07-26: 4 [IU] via SUBCUTANEOUS

## 2011-07-26 NOTE — Progress Notes (Signed)
I personally reviewed the chart & pertinent data, examined the patient, and developed the plan of care. At the time of my examination, the patient seemed to be doing well. However, per nursing staff, after lunch, she started feeling worse and failed a modified barium swallow evaluation. The primary team will keep her hospitalized for at least another day. Per nurse, patient's urine smelled foul, so agree with checking a UA to rule-out possibility of UTI causing reactivation of her symptoms. Consider checking a repeat non-contrast head CT to rule-out possibility of intracerebral hemorrhage. If no ICH is found, it is reasonable to continue dual-antiplatelet therapy with aspirin and Plavix, as per cardiology recommendations.  Luvenia Heller, MD Pager: [phone]

## 2011-07-26 NOTE — Progress Notes (Signed)
History: [name] [name] is a left handed 71 y.o. female admitted with inability to swallow food and right foot weakness. She has been able to swallow with pureed foods since her initial stroke. She presented with inability to swallow and loss of total tongue use. Now she has right foot drag, new. At home she was living independently, is able to pivot to use a scooter chair and communicates with writing. She was taking plavix PTA.  She has a history of stroke with right body weakness although she has some residual strength, right arm contracture. 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 New MRI reveals a small acute left hemisphere white matter infarct with no evidence of hemorrhage.  Subjective: Unable to verbalize, but able to make needs known with sounds and body language.  Denies pain.  Bothered by drooling, no trouble with vision.  Objective: BP 136/75  Pulse 70  Temp(Src) 97.8 F (36.6 C) (Oral)  Resp 18  Ht 5\' 5"  (1.651 m)  Wt 81.8 kg (180 lb 5.4 oz)  BMI 30.01 kg/m2  SpO2 97%  CBGs  Basename 07/26/11 1208 07/26/11 0650 07/25/11 2236 07/25/11 1529 07/25/11 1001 07/24/11 2028 07/24/11 1929 07/24/11 1607  GLUCAP 348* 192* 326* 203* 140* 119* 63* 77   Diet: D1, nectar thick  Activity: up with assistance  DVT Prophylaxis: lovenox  Medications: Scheduled:   . aspirin EC  81 mg Oral Daily  . atorvastatin  80 mg Oral q1800  . carvedilol  6.25 mg Oral Q breakfast  . clopidogrel  75 mg Oral Q breakfast  . enoxaparin  40 mg Subcutaneous Q24H  . escitalopram  20 mg Oral Daily  . ezetimibe  10 mg Oral Daily  . hydrochlorothiazide  25 mg Oral Daily  . insulin aspart  0-9 Units Subcutaneous TID WC  . insulin aspart protamine-insulin aspart  20 Units Subcutaneous BID WC  . lisinopril  20 mg Oral Daily  . DISCONTD: aspirin  325 mg Oral Daily  . DISCONTD: clopidogrel   75 mg Oral Q breakfast  . DISCONTD: dipyridamole-aspirin  1 capsule Oral BID  . DISCONTD: insulin aspart  0-5 Units Subcutaneous QHS  . DISCONTD: insulin aspart  0-9 Units Subcutaneous TID WC   Neurologic Exam: Mental Status: Alert, oriented. Unable to speak words? Due to inability to control tongue.   Able to follow 3 step commands without difficulty. Cranial Nerves: II- Visual fields grossly intact. III/IV/VI-Extraocular movements intact. V/VII-Smile symmetric VIII-hearing grossly intact IX/X-normal gag XI-bilateral shoulder shrug XII-tongue remains protruded Motor:limited movement RUE, chronic RUE contracture.  Able to squeeze lightly on right.  LUE 5/5, RLE in brace- difficult to move, LLE 5/5.   Sensory: Light touch intact throughout, bilaterally Deep Tendon Reflexes: diminished on Right.   Cerebellar: Normal finger-to-nose on left.   Lab Results: Basic Metabolic Panel:  Lab 07/24/11 1610 07/24/11 1533  NA 144 141  K 3.8 3.7  CL 107 106  CO2 -- 27  GLUCOSE 85 84  BUN 15 14  CREATININE 1.00 0.84  CALCIUM -- 9.8  MG -- --  PHOS -- --   Liver Function Tests:  Lab 07/24/11 1533  AST 18  ALT 17  ALKPHOS 68  BILITOT 0.3  PROT 7.3  ALBUMIN 3.6   CBC:  Lab 07/24/11 1552 07/24/11 1533  WBC -- 8.0  NEUTROABS -- 4.3  HGB 13.6 12.6  HCT 40.0 39.4  MCV -- 82.3  PLT -- 177   Cardiac Enzymes:  Lab 07/25/11 1534 07/25/11 0844 07/25/11 0232  CKTOTAL 124 120 134  CKMB 3.1 3.1 3.6  CKMBINDEX -- -- --  TROPONINI 0.87* 0.92* 1.25*   CBG:  Lab 07/26/11 1208 07/26/11 0650 07/25/11 2236 07/25/11 1529 07/25/11 1001 07/24/11 2028  GLUCAP 348* 192* 326* 203* 140* 119*   Hemoglobin A1C:  Lab 07/25/11 0625  HGBA1C 7.3*   Fasting Lipid Panel:  Lab 07/25/11 0625  CHOL 209*  HDL 51  LDLCALC 140*  TRIG 92  CHOLHDL 4.1  LDLDIRECT --   Coagulation:  Lab 07/24/11 1533  LABPROT 13.0  INR 0.96   Urinalysis:  Lab 07/25/11 0526  COLORURINE YELLOW  LABSPEC  1.016  PHURINE 6.5  GLUCOSEU NEGATIVE  HGBUR NEGATIVE  BILIRUBINUR NEGATIVE  KETONESUR 15*  PROTEINUR NEGATIVE  UROBILINOGEN 0.2  NITRITE NEGATIVE  LEUKOCYTESUR NEGATIVE    Study Results:  07/24/2011   CT HEAD WITHOUT CONTRAST Findings: Patchy and more focal areas of low density in the white matter of both cerebral hemispheres.  The more focal areas involve the corona radiata, basal ganglia and internal capsules bilaterally, compatible with old infarcts.  No visible brainstem abnormalities.  Mildly enlarged ventricles and subarachnoid spaces. No intracranial hemorrhage, mass lesion or CT evidence of acute infarction.  Small right maxillary sinus retention cyst. Unremarkable bones.  IMPRESSION:  1.  No acute abnormality. 2.  Old bilateral corona radiata infarcts extending into the basal ganglia and internal capsules. 3.  Moderate chronic small vessel white matter ischemic changes in both cerebral hemispheres. 4.  Mild atrophy.  Darrol Angel, M.D.   07/25/2011 PORTABLE CHEST - 1 VIEW   Findings: Single view of the chest was obtained.  Patient has median sternotomy wires.  Heart size is grossly stable.  Slightly prominent interstitial densities without frank pulmonary edema.  No focal airspace disease.  Negative for pneumothorax.  IMPRESSION: Slightly prominent interstitial densities could represent low lung volumes versus mild vascular congestion.  No focal disease.   ADAM HENN, M.D.   07/25/2011  MRI HEAD WITHOUT CONTRAST  Technique: Multiplanar, multiecho pulse sequences of the brain and surrounding structures were obtained according to standard protocol without intravenous contrast.  Findings: 9 mm confluent area of restricted diffusion in the left corona radiata.  No mass effect or evidence of associated hemorrhage.  T2 and FLAIR hyperintensity here is superimposed on chronic confluent bilateral cerebral white matter signal abnormality, including evidence of previous bilateral white matter lacunar  infarcts tracking into the internal capsules and thalami.  No other restricted diffusion. No midline shift, mass effect, or evidence of mass lesion.  No ventriculomegaly.  Bulky dural calcifications incidentally noted at the vertex.  Major intracranial vascular flow voids are preserved although the posterior circulation appears diminutive, see MRA findings below.  Additional chronic lacunar infarcts in the left cerebellar hemisphere.  Negative pituitary, cervicomedullary junction and visualized cervical spine.  Visualized orbit soft tissues are within normal limits.  Minor paranasal sinus mucosal thickening.  Mastoids are clear.  Normal bone marrow signal.  Negative scalp soft tissues.   IMPRESSION: 1.  Small acute left hemisphere white matter infarct, small vessel type.  No mass effect or hemorrhage. 2.  Underlying advanced chronic cerebral white matter infarcts. Chronic small lacunar infarcts in the cerebellum. 3.  Diminutive appearance of the posterior circulation, see MRA findings below.    MRA HEAD WITHOUT CONTRAST  Findings: This study is degraded by motion.  Absent antegrade flow in the  distal vertebral arteries and proximal basilar artery.  Minimal intermittent antegrade flow signal in the mid and distal basilar (series 602 image 8).  Flow appears fairly preserved at the basilar tip where bilateral posterior communicating arteries are patent.  The superior cerebellar artery origins are patent. Fetal type PCA origins, no left P1 segment identified.  There is mild to moderate irregularity of the posterior communicating arteries and proximal PCA branches.  There is preserved bilateral distal PCA flow.  Antegrade flow in both ICA siphons.  ICA irregularity without hemodynamically significant stenosis.  Carotid termini are patent. Grossly normal ophthalmic and posterior communicating artery origins.  Patent MCA and ACA origins.  Major ACA branches are within normal limits.  Irregularity of both MCA M1 segment,  particulate on the right.  This could be exacerbated by the degree of motion.  Both MCA bifurcations remain patent none the left. Major bilateral M2 branches appear patent.  IMPRESSION: 1.  Poor flow or occlusion of the distal vertebral arteries and proximal basilar artery. 2.  Distal basilar and bilateral PCA flow reconstituted from the posterior communicating arteries. 3.  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  No proximal or major anterior circulation branch occlusion.   H.LEE HALL III, M.D.   07/25/11 2D Echo Left ventricle: Wall thickness was increased in a pattern of mild LVH. There was concentric hypertrophy. Systolic function was normal. Doppler parameters are consistent with abnormal left ventricular relaxation (grade 1 diastolic dysfunction). Doppler parameters are consistent with both elevated ventricular end-diastolic filling pressure and elevated left atrial filling pressure. - Ventricular septum: The outflow septum had a sigmoid appearance. - Aorta: The aorta was mildly diseased. I cannot exclude presence of complex arch atheroma. - Mitral valve: Calcified annulus. Mildly thickened leaflets - Atrial septum: No defect or patent foramen ovale was identified.  Assessment: 71 yo AAF with acute left hemisphere white matter infarct presenting as increased deficit in tongue/right foot strength from previous cerebellar infarcts.  Regarding anticoagulation, cardiology recommends that patient be on aspirin along with Plavix for non-ST elevation myocardial infarction and new EKG changes that are persistent.  They also recommend adding Zetia to present medical therapy as LDL not at goal in spite of being on 40 mg of Crestor.  LDL 140 A1C 7.3  Plan:  1. Ok to proceed with aspirin and Plavix from neuro stand point.   2.  Will need outpatient neurology follow-up. 3. Patient being discharged home by primary team. 4. Home health  therapies  Discussed with Dr. Regino Schultze.   LOS: 2 days   Marya Fossa PA-C [facility] [phone] 07/26/2011  1:54 PM

## 2011-07-26 NOTE — Procedures (Signed)
Objective Swallowing Evaluation: Modified Barium Swallowing Study  Patient Details  Name: [name] [name] MRN: [id number] Date of Birth: Dec 10, 1940  Today's Date: 07/26/2011 Time: 1300-1330 SLP Time Calculation (min): 30 min  Past Medical History:  Past Medical History  Diagnosis Date  . 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 Communicates by writing   . Hypertension   . Hypercholesteremia   . Complication of anesthesia     lung   . Angina   . GERD (gastroesophageal reflux disease)   . Myocardial infarction     CABG 2001, ? stent. Cath 02/2011 with new distal LIMA-LAD 80%. SVG-OM occlusion is old, rest of grafts patent  . Peripheral vascular disease   . Diabetes mellitus   . H/O hiatal hernia    Past Surgical History:  Past Surgical History  Procedure Date  . Coronary artery bypass graft   . Abdominal hysterectomy   . Breast biopsy   . Esophagogastroduodenoscopy 03/12/2011    Procedure: ESOPHAGOGASTRODUODENOSCOPY (EGD);  Surgeon: Theda Belfast, MD;  Location: [facility] ENDOSCOPY;  Service: Endoscopy;  Laterality: N/A;  . Cardiac catheterization 02/2011   HPI:  71 y/o female referred for objective assessment of MBS following BSE completed on 07/25/11. Patient with history of dysphagia with last MBS completed on outpatient basis indicates severe oral dysphagia due to lack of lingual propulsion . Diet recommended from most current BSE is dysphagia 1 and nectar thick liquids. Patient communicates mainly via yes/no questioning and through writing down message.  Patient noted to have difficulty managing own saliva prior to study with severe spillage of saliva on right.     Assessment / Plan / Recommendation Clinical Impression  Evaluation limited due to majority of trials spilled anterior on right before and during swallow. Patient continues with  severe oral sensory -motor dysphagia marked by severe decreased lingual coordination affecting bolus cohesion and lingual  propulsion for limited trials. Strategy of placing spoon posteriorly and with pressure to increase TBR ineffective with trials. Patient unable to propel puree and mechanical soft trials posteriorly and initiate swallow unable to assess pharyngeal phase with solid consistencies. Eventually, treating SLP cleared bolus from oral cavity.  Moderate pharyngeal sensory-motor dysphagia marked mainly by decreased or nonexistent TBR. Severe pooling in vallecular space with nectar and thin liquid trials by cup. Flash penetration before swallow with nectar by cup x1. Penetration and trace aspiration before and during swallow of thin liquids.  Recommend to continue current diet of dysphagia 1 and nectar thick liquids as patient is independent in utilizing compensatory strategies to compensate for oral deficits. Question patient's ability to meet all nutritional  and hydration needs per oral diet.  Prior reports indicate patient expresses to continue eating and drinking despite known aspiration risk. ST to follow for POC.      Treatment Recommendation    Continued ST treatment in acute care setting for diet tolerance  Recommend continued swallow treatment on outpatient setting.   Diet Recommendation Dysphagia 1 (Puree);Nectar-thick liquid   Liquid Administration via: Cup Medication Administration: Crushed with puree Supervision: Full supervision/cueing for compensatory strategies Compensations: Slow rate;Small sips/bites;Follow solids with liquid Postural Changes and/or Swallow Maneuvers: Out of bed for meals;Seated upright 90 degrees;Upright 30-60 min after meal    Other  Recommendations Oral Care Recommendations: Oral care before and after PO Other Recommendations: Clarify dietary restrictions;Order thickener from pharmacy;Prohibited food (jello, ice cream, thin soups);Have oral suction available   Follow Up Recommendations  Outpatient SLP    Frequency  and Duration min 2x/week  2 weeks       SLP Swallow  Goals Patient will consume recommended diet without observed clinical signs of aspiration with: Maximum assistance Patient will utilize recommended strategies during swallow to increase swallowing safety with: Maximum assistance   General Date of Onset: 07/24/11 HPI: 70 y/o female referred for objective assessment of MBS following BSE completed on 07/25/11. Patient with history of dysphagia with last MBS completed on outpatient basis indicates severe oral dysphagia due to lack of lingual propulsion . Diet recommended from most current BSE is dysphagia  1 and nectar thick liquids.  Type of Study: Modified Barium Swallowing Study Reason for Referral: Objectively evaluate swallowing function Previous Swallow Assessment: MBS 2012 with recommendations of thin liquids and mechanical soft consistency Diet Prior to this Study: Dysphagia 1 (puree);Nectar-thick liquids Temperature Spikes Noted: No Respiratory Status: Room air History of Recent Intubation: No Behavior/Cognition: Alert;Cooperative Oral Cavity - Dentition: Adequate natural dentition Oral Motor / Sensory Function: Impaired motor;Impaired sensory;Impaired - see Bedside swallow eval Oral impairment: Right lingual;Right labial Self-Feeding Abilities: Needs assist Patient Positioning: Upright in chair Baseline Vocal Quality: Other (comment) (severe dysarthria ) Volitional Cough: Strong Volitional Swallow: Able to elicit Pharyngeal Secretions: Not observed secondary MBS    Reason for Referral Objectively evaluate swallowing function   Oral Phase Oral phase: Impaired   Pharyngeal Phase Pharyngeal Phase: Impaired   Cervical Esophageal Phase Cervical Esophageal Phase:  (unable to assess )   Moreen Fowler MS, CCC-SLP [phone] [facility] 07/26/2011, 5:05 PM

## 2011-07-26 NOTE — Progress Notes (Signed)
Subjective:   Patient says that she's feeling much better. Indicates that her right leg strength is improved and is almost to baseline. She is able to eat and make grunting sounds like before. No chest pain, dyspnea. No fever or chills.   Objective  Vital signs in last 24 hours: Filed Vitals:   07/25/11 1800 07/25/11 2217 07/26/11 0529 07/26/11 1048  BP: 115/63 125/75 116/72 136/75  Pulse: 73 65 69 70  Temp: 98.7 F (37.1 C) 98.5 F (36.9 C) 98.4 F (36.9 C) 97.8 F (36.6 C)  TempSrc: Oral Oral Oral Oral  Resp: 18 18 18 18   Height:      Weight:      SpO2: 98% 98% 96% 97%   Weight change:   Intake/Output Summary (Last 24 hours) at 07/26/11 1141 Last data filed at 07/25/11 2000  Gross per 24 hour  Intake    120 ml  Output      0 ml  Net    120 ml    Physical Exam:  General Exam: Comfortable. Sitting on a reclining chair.  Respiratory System: Clear. No increased work of breathing.  Cardiovascular System: First and second heart sounds heard. Regular rate and rhythm. No JVD/murmurs. Telemetry shows  sinus bradycardia in the 50s to sinus rhythm in the 60s without arrhythmia alarms .  Gastrointestinal System: Abdomen is non distended, soft and normal bowel sounds heard. Non-tender. Central Nervous System: Alert and oriented -writes with her left hand. A right facial droop with drooling of saliva on the right angle of the mouth.  expressive aphasia. Makes grunting sounds.  Extremities: Left upper and lower extremities: Grade 5/5 power. Right upper extremity: Grade 2 x 5 power. Right lower extremity: Grade 4 x 5 power.  Labs:  Basic Metabolic Panel:  Lab 07/24/11 1610 07/24/11 1533  NA 144 141  K 3.8 3.7  CL 107 106  CO2 -- 27  GLUCOSE 85 84  BUN 15 14  CREATININE 1.00 0.84  CALCIUM -- 9.8  ALB -- --  PHOS -- --   Liver Function Tests:  Lab 07/24/11 1533  AST 18  ALT 17  ALKPHOS 68  BILITOT 0.3  PROT 7.3  ALBUMIN 3.6   No results found for this basename:  LIPASE:3,AMYLASE:3 in the last 168 hours No results found for this basename: AMMONIA:3 in the last 168 hours CBC:  Lab 07/24/11 1552 07/24/11 1533  WBC -- 8.0  NEUTROABS -- 4.3  HGB 13.6 12.6  HCT 40.0 39.4  MCV -- 82.3  PLT -- 177   Cardiac Enzymes:  Lab 07/25/11 1534 07/25/11 0844 07/25/11 0232 07/24/11 1534  CKTOTAL 124 120 134 155  CKMB 3.1 3.1 3.6 4.0  CKMBINDEX -- -- -- --  TROPONINI 0.87* 0.92* 1.25* 1.22*   CBG:  Lab 07/26/11 0650 07/25/11 2236 07/25/11 1529 07/25/11 1001 07/24/11 2028  GLUCAP 192* 326* 203* 140* 119*    Iron Studies: No results found for this basename: IRON,TIBC,TRANSFERRIN,FERRITIN in the last 72 hours Studies/Results: Ct Head Wo Contrast  07/24/2011  *RADIOLOGY REPORT*  Clinical Data: Increasing aphasia.  Stroke 10 years ago.  CT HEAD WITHOUT CONTRAST  Technique:  Contiguous axial images were obtained from the base of the skull through the vertex without contrast.  Comparison: None.  Findings: Patchy and more focal areas of low density in the white matter of both cerebral hemispheres.  The more focal areas involve the corona radiata, basal ganglia and internal capsules bilaterally, compatible with old infarcts.  No  visible brainstem abnormalities.  Mildly enlarged ventricles and subarachnoid spaces. No intracranial hemorrhage, mass lesion or CT evidence of acute infarction.  Small right maxillary sinus retention cyst. Unremarkable bones.  IMPRESSION:  1.  No acute abnormality. 2.  Old bilateral corona radiata infarcts extending into the basal ganglia and internal capsules. 3.  Moderate chronic small vessel white matter ischemic changes in both cerebral hemispheres. 4.  Mild atrophy.  Original Report Authenticated By: Darrol Angel, M.D.   Mri Brain Without Contrast  07/25/2011  *RADIOLOGY REPORT*  Clinical Data:  71 year old female with increasing abnormal speech. Remote history of stroke 10 years ago.  Comparison: Head CT without contrast 07/24/2011.  MRI  HEAD WITHOUT CONTRAST  Technique: Multiplanar, multiecho pulse sequences of the brain and surrounding structures were obtained according to standard protocol without intravenous contrast.  Findings: 9 mm confluent area of restricted diffusion in the left corona radiata.  No mass effect or evidence of associated hemorrhage.  T2 and FLAIR hyperintensity here is superimposed on chronic confluent bilateral cerebral white matter signal abnormality, including evidence of previous bilateral white matter lacunar infarcts tracking into the internal capsules and thalami.  No other restricted diffusion. No midline shift, mass effect, or evidence of mass lesion.  No ventriculomegaly.  Bulky dural calcifications incidentally noted at the vertex.  Major intracranial vascular flow voids are preserved although the posterior circulation appears diminutive, see MRA findings below.  Additional chronic lacunar infarcts in the left cerebellar hemisphere.  Negative pituitary, cervicomedullary junction and visualized cervical spine.  Visualized orbit soft tissues are within normal limits.  Minor paranasal sinus mucosal thickening.  Mastoids are clear.  Normal bone marrow signal.  Negative scalp soft tissues.  IMPRESSION: 1.  Small acute left hemisphere white matter infarct, small vessel type.  No mass effect or hemorrhage. 2.  Underlying advanced chronic cerebral white matter infarcts. Chronic small lacunar infarcts in the cerebellum. 3.  Diminutive appearance of the posterior circulation, see MRA findings below.  MRA HEAD WITHOUT CONTRAST  Technique: Angiographic images of the Circle of Willis were obtained using MRA technique without  intravenous contrast.  Findings: This study is degraded by motion.  Absent antegrade flow in the distal vertebral arteries and proximal basilar artery.  Minimal intermittent antegrade flow signal in the mid and distal basilar (series 602 image 8).  Flow appears fairly preserved at the basilar tip where  bilateral posterior communicating arteries are patent.  The superior cerebellar artery origins are patent. Fetal type PCA origins, no left P1 segment identified.  There is mild to moderate irregularity of the posterior communicating arteries and proximal PCA branches.  There is preserved bilateral distal PCA flow.  Antegrade flow in both ICA siphons.  ICA irregularity without hemodynamically significant stenosis.  Carotid termini are patent. Grossly normal ophthalmic and posterior communicating artery origins.  Patent MCA and ACA origins.  Major ACA branches are within normal limits.  Irregularity of both MCA M1 segment, particulate on the right.  This could be exacerbated by the degree of motion.  Both MCA bifurcations remain patent none the left. Major bilateral M2 branches appear patent.  IMPRESSION: 1.  Poor flow or occlusion of the distal vertebral arteries and proximal basilar artery. 2.  Distal basilar and bilateral PCA flow reconstituted from the posterior communicating arteries. 3.  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  No proximal or major anterior circulation branch occlusion.  Study discussed by telephone with Dr. Debby Bud on 07/25/11  at 1343 hrs.  Original Report Authenticated By: Harley Hallmark, M.D.   Dg Chest Port 1 View  07/25/2011  *RADIOLOGY REPORT*  Clinical Data: History of stroke.  PORTABLE CHEST - 1 VIEW  Comparison: 03/10/2011  Findings: Single view of the chest was obtained.  Patient has median sternotomy wires.  Heart size is grossly stable.  Slightly prominent interstitial densities without frank pulmonary edema.  No focal airspace disease.  Negative for pneumothorax.  IMPRESSION: Slightly prominent interstitial densities could represent low lung volumes versus mild vascular congestion.  No focal disease.  Original Report Authenticated By: Richarda Overlie, M.D.   2-D echocardiogram:  Study Conclusions  - Left ventricle:  Wall thickness was increased in a pattern of mild LVH. There was concentric hypertrophy. Systolic function was normal. Doppler parameters are consistent with abnormal left ventricular relaxation (grade 1 diastolic dysfunction). Doppler parameters are consistent with both elevated ventricular end-diastolic filling pressure and elevated left atrial filling pressure. - Ventricular septum: The outflow septum had a sigmoid appearance. - Aorta: The aorta was mildly diseased. I cannot exclude presence of complex arch atheroma. - Mitral valve: Calcified annulus. Mildly thickened leaflets. - Atrial septum: No defect or patent foramen ovale was identified.    Medications:      . atorvastatin  80 mg Oral q1800  . carvedilol  6.25 mg Oral Q breakfast  . clopidogrel  75 mg Oral Q breakfast  . enoxaparin  40 mg Subcutaneous Q24H  . escitalopram  20 mg Oral Daily  . hydrochlorothiazide  25 mg Oral Daily  . insulin aspart  0-5 Units Subcutaneous QHS  . insulin aspart  0-9 Units Subcutaneous TID WC  . lisinopril  20 mg Oral Daily  . DISCONTD: aspirin  325 mg Oral Daily  . DISCONTD: clopidogrel  75 mg Oral Q breakfast  . DISCONTD: dipyridamole-aspirin  1 capsule Oral BID  . DISCONTD: insulin aspart  0-9 Units Subcutaneous TID WC    I  have reviewed scheduled and prn medications.     Problem/Plan: Principal Problem:  *TIA (transient ischemic attack) Active Problems:  HTN (hypertension)  Elevated troponin I level  Diabetes mellitus  1. 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 Patient has prior multiple white matter infarcts. 2-D echo shows normal systolic function. Carotid Dopplers showed no hemodynamically significant internal carotid artery stenosis . Neurology  following . Antiplatelet agents changed back by cardiology to aspirin and Plavix and due to her cardiac risk profile. Continue Lipitor. Patient started on dysphagia 1 diet with  nectar thick liquids as per speech therapy recommendations-tolerating . PT and OT evaluation pending.  2. Hypertension: Reasonably controlled. Continue lisinopril, hydrochlorothiazide and carvedilol. 3. Non-ST elevation MI with new EKG changes: Cardiology consultation appreciated and discussed with Dr. Jacinto Halim. Outpatient stress test on 05/17/2010 revealed small to moderate sized severe basal inferolateral ischemia. She was treated medically and had complete resolution of chest pain. He recommends continued medical management and has changed Aggrenox to Plavix and aspirin.  Zetia added for better control of hyperlipidemia. Echo with normal systolic function. Continue carvedilol. 4. Type 2 diabetes mellitus: Had a hypoglycemic episode in the emergency department but now the blood sugars are greater than 200 mg/dL.  Resume NovoLog 70/30 at 20 units subcutaneously twice a day  5. Hyperlipidemia: Continue statins. added Zetia.     Homero Hyson 07/26/2011,11:41 AM  LOS: 2 days

## 2011-07-27 DIAGNOSIS — I214 Non-ST elevation (NSTEMI) myocardial infarction: Secondary | ICD-10-CM | POA: Diagnosis present

## 2011-07-27 DIAGNOSIS — E1165 Type 2 diabetes mellitus with hyperglycemia: Secondary | ICD-10-CM

## 2011-07-27 DIAGNOSIS — G459 Transient cerebral ischemic attack, unspecified: Secondary | ICD-10-CM

## 2011-07-27 DIAGNOSIS — E118 Type 2 diabetes mellitus with unspecified complications: Secondary | ICD-10-CM

## 2011-07-27 DIAGNOSIS — I6340 Cerebral infarction due to embolism of unspecified cerebral artery: Secondary | ICD-10-CM

## 2011-07-27 DIAGNOSIS — I10 Essential (primary) hypertension: Secondary | ICD-10-CM

## 2011-07-27 HISTORY — DX: Non-ST elevation (NSTEMI) myocardial infarction: I21.4

## 2011-07-27 LAB — GLUCOSE, CAPILLARY

## 2011-07-27 MED ORDER — EZETIMIBE 10 MG PO TABS: 10.0000 mg | ORAL_TABLET | Freq: Every day | ORAL | Status: DC

## 2011-07-27 MED ORDER — ASPIRIN 81 MG PO TBEC: 81.0000 mg | DELAYED_RELEASE_TABLET | Freq: Every day | ORAL | Status: DC

## 2011-07-27 NOTE — Discharge Summary (Signed)
Discharge Summary  [name] [name] MR#: [id number]  DOB:[birth year]  Date of Admission: 07/24/2011 Date of Discharge: 07/27/2011  Patient's PCP: Ralene Ok, MD, MD  Attending Physician:Jaquayla Hege  Consults: 1. Neurology: Dr. Noel Christmas.  2. Cardiology: Pamella Pert, MD 3. Physical therapy, occupational therapy and speech therapy.   Discharge Diagnoses: Principal Problem:  *CVA (cerebral infarction) Active Problems:  HTN (hypertension)  Diabetes mellitus  NSTEMI (non-ST elevated myocardial infarction)   Brief Admitting History and Physical 71 year old [place] American female patient with prior history of stroke with associated aphasia and right hemiparesis, hypertension, hyperlipidemia, coronary artery disease status post CABG 2001, outpatient stress test on 05/17/10 which revealed small to moderate sized severe basal inferolateral ischemia, type 2 diabetes mellitus/insulin-dependent, who presented to the emergency department with more than usual weakness of right leg, inability to eat and grunt like she usually does.  Discharge Medications Current Discharge Medication List    START taking these medications   Details  aspirin EC 81 MG EC tablet Take 1 tablet (81 mg total) by mouth daily.    ezetimibe (ZETIA) 10 MG tablet Take 1 tablet (10 mg total) by mouth daily. Qty: 30 tablet, Refills: 0      CONTINUE these medications which have NOT CHANGED   Details  carvedilol (COREG) 6.25 MG tablet Take 6.25 mg by mouth daily.    clopidogrel (PLAVIX) 75 MG tablet Take 75 mg by mouth daily.      escitalopram (LEXAPRO) 20 MG tablet Take 20 mg by mouth daily.    hydrochlorothiazide (HYDRODIURIL) 25 MG tablet Take 25 mg by mouth daily.      insulin aspart protamine-insulin aspart (NOVOLOG 70/30) (70-30) 100 UNIT/ML injection Inject 28-36 Units into the skin 2 (two) times daily. 36 units in AM and 28 units in PM    lisinopril (PRINIVIL,ZESTRIL) 20 MG tablet Take 20 mg by  mouth daily.    nitroGLYCERIN (NITROSTAT) 0.4 MG SL tablet Place 0.4 mg under the tongue every 5 (five) minutes as needed.    rosuvastatin (CRESTOR) 40 MG tablet Take 40 mg by mouth at bedtime.          Hospital Course: CVA (cerebral infarction) Present on Admission:  .TIA (transient ischemic attack) .Elevated troponin I level .Diabetes mellitus .HTN (hypertension) .NSTEMI (non-ST elevated myocardial infarction) .CVA (cerebral infarction)   1. 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 Patient has prior multiple white matter infarcts. 2-D echo shows normal systolic function. Carotid Dopplers showed no hemodynamically significant internal carotid artery stenosis . Neurology was consulted and had initially placed her on Aggrenox but cardiology preferred low dose aspirin and Plavix given her cardiac risk profile. She was thereby changed to aspirin and Plavix. Statins were continued but Zetia of was added for better control. Patient started on dysphagia 1 diet with nectar thick liquids as per speech therapy recommendations. PT and OT recommend home health services which will be arranged.Patient initially improved then had a brief period of worsening swallowing and generalized weakness on the afternoon of 6/1. Repeat head CT did not show any new findings and urine analysis was not suggestive of infection. Today she indicates that she is much improved with her swallowing back to baseline and right lower extremity weakness which is almost back to baseline. Neurology has cleared her for discharge.  2. Hypertension: Reasonably controlled. Continue lisinopril, hydrochlorothiazide and carvedilol. 3. Non-ST elevation MI with new EKG changes: Cardiology consulted. Dr. Jacinto Halim kindly saw the patient. Outpatient stress test on  05/17/2010 revealed small to moderate sized severe basal inferolateral ischemia. She was treated medically and had complete  resolution of chest pain. Cardiology recommended continued medical management and Plavix and aspirin. Zetia added for better control of hyperlipidemia. Echo with normal systolic function. Continue carvedilol. 4. Type 2 diabetes mellitus: Had a hypoglycemic episode in the emergency department which resolved and her blood sugar started rising following which 70/30 insulin was initiated at lower than home doses. She is advised to monitor closely for hypoglycemia at home. She indicates understanding. She denies any hypoglycemic episodes prior to admission. A1c of 7.3 suggests her diabetes will need slightly better control as outpatient. 5. Hyperlipidemia: Continue statins. added Zetia.  6. CAD status post CABG in [place] in 2001 and? Stent in 2001 after CABG   Day of Discharge  Complaints: She says she feels much better today. Able to swallow like before. Indicates that drooling from the right angle of the mouth is unchanged from prior to admission. Says right leg strength is also better and possibly at baseline.  Physical exam: BP 101/60  Pulse 69  Temp(Src) 99 F (37.2 C) (Oral)  Resp 20  Ht 5\' 5"  (1.651 m)  Wt 81.8 kg (180 lb 5.4 oz)  BMI 30.01 kg/m2  SpO2 92% General Exam: Comfortable. Sitting on a reclining chair.  Respiratory System: Clear. No increased work of breathing.  Cardiovascular System: First and second heart sounds heard. Regular rate and rhythm. No JVD/murmurs. Telemetry shows sinus bradycardia in the 50s to sinus rhythm in the 70s without arrhythmia alarms .  Gastrointestinal System: Abdomen is non distended, soft and normal bowel sounds heard. Non-tender.  Central Nervous System: Alert and oriented -writes with her left hand. A right facial droop with drooling of saliva on the right angle of the mouth. expressive aphasia. Makes grunting sounds.  Extremities: Left upper and lower extremities: Grade 5/5 power. Right upper extremity: Grade 2 x 5 power. Right lower extremity:  Grade 4 x 5 power.  Basic Metabolic Panel:  Lab 07/24/11 1191 07/24/11 1533  NA 144 141  K 3.8 3.7  CL 107 106  CO2 -- 27  GLUCOSE 85 84  BUN 15 14  CREATININE 1.00 0.84  CALCIUM -- 9.8  ALB -- --  PHOS -- --   Liver Function Tests:  Lab 07/24/11 1533  AST 18  ALT 17  ALKPHOS 68  BILITOT 0.3  PROT 7.3  ALBUMIN 3.6   No results found for this basename: LIPASE:3,AMYLASE:3 in the last 168 hours No results found for this basename: AMMONIA:3 in the last 168 hours CBC:  Lab 07/24/11 1552 07/24/11 1533  WBC -- 8.0  NEUTROABS -- 4.3  HGB 13.6 12.6  HCT 40.0 39.4  MCV -- 82.3  PLT -- 177   Cardiac Enzymes:  Lab 07/25/11 1534 07/25/11 0844 07/25/11 0232 07/24/11 1534  CKTOTAL 124 120 134 155  CKMB 3.1 3.1 3.6 4.0  CKMBINDEX -- -- -- --  TROPONINI 0.87* 0.92* 1.25* 1.22*   CBG:  Lab 07/27/11 0656 07/26/11 2140 07/26/11 1653 07/26/11 1208 07/26/11 0650  GLUCAP 128* 346* 191* 348* 192*   Other lab data:  1. Lipid panel: Cholesterol 209, triglycerides 92, HDL 51, LDL 140, VLDL 18. 2. INR: 4.78. 3. Hemoglobin A1c: 7.3. 4. Urine analysis: Greater than 1000 mg/dL of glucose, negative for protein. No features of urinary tract infection.   Studies/Results:   Ct Head Wo Contrast   07/24/2011 *RADIOLOGY REPORT* Clinical Data: Increasing aphasia. Stroke 10 years ago.  CT HEAD WITHOUT CONTRAST Technique: Contiguous axial images were obtained from the base of the skull through the vertex without contrast. Comparison: None. Findings: Patchy and more focal areas of low density in the white matter of both cerebral hemispheres. The more focal areas involve the corona radiata, basal ganglia and internal capsules bilaterally, compatible with old infarcts. No visible brainstem abnormalities. Mildly enlarged ventricles and subarachnoid spaces. No intracranial hemorrhage, mass lesion or CT evidence of acute infarction. Small right maxillary sinus retention cyst. Unremarkable bones.  IMPRESSION: 1. No acute abnormality. 2. Old bilateral corona radiata infarcts extending into the basal ganglia and internal capsules. 3. Moderate chronic small vessel white matter ischemic changes in both cerebral hemispheres. 4. Mild atrophy. Original Report Authenticated By: Darrol Angel, M.D.    Mri Brain Without Contrast   07/25/2011 *RADIOLOGY REPORT* Clinical Data: 71 year old female with increasing abnormal speech. Remote history of stroke 10 years ago. Comparison: Head CT without contrast 07/24/2011. MRI HEAD WITHOUT CONTRAST Technique: Multiplanar, multiecho pulse sequences of the brain and surrounding structures were obtained according to standard protocol without intravenous contrast. Findings: 9 mm confluent area of restricted diffusion in the left corona radiata. No mass effect or evidence of associated hemorrhage. T2 and FLAIR hyperintensity here is superimposed on chronic confluent bilateral cerebral white matter signal abnormality, including evidence of previous bilateral white matter lacunar infarcts tracking into the internal capsules and thalami. No other restricted diffusion. No midline shift, mass effect, or evidence of mass lesion. No ventriculomegaly. Bulky dural calcifications incidentally noted at the vertex. Major intracranial vascular flow voids are preserved although the posterior circulation appears diminutive, see MRA findings below. Additional chronic lacunar infarcts in the left cerebellar hemisphere. Negative pituitary, cervicomedullary junction and visualized cervical spine. Visualized orbit soft tissues are within normal limits. Minor paranasal sinus mucosal thickening. Mastoids are clear. Normal bone marrow signal. Negative scalp soft tissues. IMPRESSION: 1. Small acute left hemisphere white matter infarct, small vessel type. No mass effect or hemorrhage. 2. Underlying advanced chronic cerebral white matter infarcts. Chronic small lacunar infarcts in the cerebellum. 3.  Diminutive appearance of the posterior circulation, see MRA findings below. MRA HEAD WITHOUT CONTRAST Technique: Angiographic images of the Circle of Willis were obtained using MRA technique without intravenous contrast. Findings: This study is degraded by motion. Absent antegrade flow in the distal vertebral arteries and proximal basilar artery. Minimal intermittent antegrade flow signal in the mid and distal basilar (series 602 image 8). Flow appears fairly preserved at the basilar tip where bilateral posterior communicating arteries are patent. The superior cerebellar artery origins are patent. Fetal type PCA origins, no left P1 segment identified. There is mild to moderate irregularity of the posterior communicating arteries and proximal PCA branches. There is preserved bilateral distal PCA flow. Antegrade flow in both ICA siphons. ICA irregularity without hemodynamically significant stenosis. Carotid termini are patent. Grossly normal ophthalmic and posterior communicating artery origins. Patent MCA and ACA origins. Major ACA branches are within normal limits. Irregularity of both MCA M1 segment, particulate on the right. This could be exacerbated by the degree of motion. Both MCA bifurcations remain patent none the left. Major bilateral M2 branches appear patent. IMPRESSION: 1. Poor flow or occlusion of the distal vertebral arteries and proximal basilar artery. 2. Distal basilar and bilateral PCA flow reconstituted from the posterior communicating arteries. 3. 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 No proximal or major anterior circulation branch occlusion. Study discussed by telephone with Dr. Mervyn Skeeters.  Ayeisha Lindenberger on 07/25/11 at 1343 hrs. Original Report Authenticated By: Harley Hallmark, M.D.    Dg Chest Port 1 View  07/25/2011 *RADIOLOGY REPORT* Clinical Data: History of stroke. PORTABLE CHEST - 1 VIEW Comparison: 03/10/2011 Findings: Single view of the  chest was obtained. Patient has median sternotomy wires. Heart size is grossly stable. Slightly prominent interstitial densities without frank pulmonary edema. No focal airspace disease. Negative for pneumothorax. IMPRESSION: Slightly prominent interstitial densities could represent low lung volumes versus mild vascular congestion. No focal disease. Original Report Authenticated By: Richarda Overlie, M.D.    2-D echocardiogram:   Study Conclusions  - Left ventricle: Wall thickness was increased in a pattern of mild LVH. There was concentric hypertrophy. Systolic function was normal. Doppler parameters are consistent with abnormal left ventricular relaxation (grade 1 diastolic dysfunction). Doppler parameters are consistent with both elevated ventricular end-diastolic filling pressure and elevated left atrial filling pressure. - Ventricular septum: The outflow septum had a sigmoid appearance. - Aorta: The aorta was mildly diseased. I cannot exclude presence of complex arch atheroma. - Mitral valve: Calcified annulus. Mildly thickened leaflets. - Atrial septum: No defect or patent foramen ovale was identified.   CT head without contrast: 07/26/11  IMPRESSION:  Expected CT appearance of the left corona radiata infarct. No mass  effect or hemorrhage.  Stable brain otherwise and no new intracranial abnormality.    Disposition: Discharged home in stable condition.  Diet: Dysphagia 1 diet with nectar thick liquids.  Activity: Increase activity gradually.   Follow-up Appts: Discharge Orders    Future Orders Please Complete By Expires   Increase activity slowly      Discharge instructions      Comments:   Diet: Dysphagia 1 with nectar thick liquids, as per instructions.   Call MD for:      Comments:   Stroke like symptoms or chest pain.      TESTS THAT NEED FOLLOW-UP None  Time spent on discharge, talking to the patient, and coordinating care: 35 mins.   SignedMarcellus Scott,  MD 07/27/2011, 11:49 AM

## 2011-07-27 NOTE — Progress Notes (Signed)
History: [name] [name] is a left handed 71 y.o. female admitted with inability to swallow food and right foot weakness. She has been able to swallow with pureed foods since her initial stroke. She presented with inability to swallow and loss of total tongue use. Now she has right foot drag, new. At home she was living independently, is able to pivot to use a scooter chair and communicates with writing. She was taking plavix PTA.  She has a history of stroke with right body weakness although she has some residual strength, right arm contracture. 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 New MRI reveals a small acute left hemisphere white matter infarct with no evidence of hemorrhage.  Subjective: Unable to verbalize, but able to make needs known with sounds and body language. Feels fine today.  Indicates she is ready to go home.    Objective: BP 114/66  Pulse 61  Temp(Src) 98.4 F (36.9 C) (Oral)  Resp 20  Ht 5\' 5"  (1.651 m)  Wt 81.8 kg (180 lb 5.4 oz)  BMI 30.01 kg/m2  SpO2 96%  CBGs  Basename 07/27/11 0656 07/26/11 2140 07/26/11 1653 07/26/11 1208 07/26/11 0650 07/25/11 2236 07/25/11 1529 07/25/11 1001 07/24/11 2028 07/24/11 1929  GLUCAP 128* 346* 191* 348* 192* 326* 203* 140* 119* 63*   Diet: D1, nectar thick  Activity: up with assistance  DVT Prophylaxis: lovenox  Medications: Scheduled:    . aspirin EC  81 mg Oral Daily  . atorvastatin  80 mg Oral q1800  . carvedilol  6.25 mg Oral Q breakfast  . clopidogrel  75 mg Oral Q breakfast  . enoxaparin  40 mg Subcutaneous Q24H  . escitalopram  20 mg Oral Daily  . ezetimibe  10 mg Oral Daily  . hydrochlorothiazide  25 mg Oral Daily  . insulin aspart  0-9 Units Subcutaneous TID WC  . insulin aspart  4 Units Subcutaneous Once  . insulin aspart protamine-insulin aspart  20 Units Subcutaneous BID WC  . lisinopril   20 mg Oral Daily  . DISCONTD: insulin aspart  0-5 Units Subcutaneous QHS   Neurologic Exam: Mental Status: Alert, oriented. Unable to speak words? Due to inability to control tongue.   Able to follow 3 step commands without difficulty. Cranial Nerves: II- Visual fields grossly intact. III/IV/VI-Extraocular movements intact. V/VII-Smile symmetric VIII-hearing grossly intact IX/X-normal gag XI-bilateral shoulder shrug XII-tongue remains protruded, drooling Motor:limited movement RUE, chronic RUE contracture.  Able to squeeze lightly on right.  LUE 5/5, RLE 4-/5, LLE 5/5.   Sensory: Light touch intact throughout, bilaterally Deep Tendon Reflexes: diminished on Right.   Cerebellar: Normal finger-to-nose on left.   Lab Results: Basic Metabolic Panel:  Lab 07/24/11 1610 07/24/11 1533  NA 144 141  K 3.8 3.7  CL 107 106  CO2 -- 27  GLUCOSE 85 84  BUN 15 14  CREATININE 1.00 0.84  CALCIUM -- 9.8  MG -- --  PHOS -- --   Liver Function Tests:  Lab 07/24/11 1533  AST 18  ALT 17  ALKPHOS 68  BILITOT 0.3  PROT 7.3  ALBUMIN 3.6   CBC:  Lab 07/24/11 1552 07/24/11 1533  WBC -- 8.0  NEUTROABS -- 4.3  HGB 13.6 12.6  HCT 40.0 39.4  MCV -- 82.3  PLT -- 177   Cardiac Enzymes:  Lab 07/25/11 1534 07/25/11 0844 07/25/11 0232  CKTOTAL 124 120 134  CKMB 3.1  3.1 3.6  CKMBINDEX -- -- --  TROPONINI 0.87* 0.92* 1.25*   CBG:  Lab 07/27/11 0656 07/26/11 2140 07/26/11 1653 07/26/11 1208 07/26/11 0650 07/25/11 2236  GLUCAP 128* 346* 191* 348* 192* 326*   Hemoglobin A1C:  Lab 07/25/11 0625  HGBA1C 7.3*   Fasting Lipid Panel:  Lab 07/25/11 0625  CHOL 209*  HDL 51  LDLCALC 140*  TRIG 92  CHOLHDL 4.1  LDLDIRECT --   Coagulation:  Lab 07/24/11 1533  LABPROT 13.0  INR 0.96   Urinalysis:  Lab 07/26/11 1600 07/25/11 0526  COLORURINE YELLOW YELLOW  LABSPEC 1.025 1.016  PHURINE 6.0 6.5  GLUCOSEU >1000* NEGATIVE  HGBUR NEGATIVE NEGATIVE  BILIRUBINUR NEGATIVE NEGATIVE    KETONESUR NEGATIVE 15*  PROTEINUR NEGATIVE NEGATIVE  UROBILINOGEN 1.0 0.2  NITRITE NEGATIVE NEGATIVE  LEUKOCYTESUR NEGATIVE NEGATIVE    Study Results:  07/24/2011   CT HEAD WITHOUT CONTRAST Findings: Patchy and more focal areas of low density in the white matter of both cerebral hemispheres.  The more focal areas involve the corona radiata, basal ganglia and internal capsules bilaterally, compatible with old infarcts.  No visible brainstem abnormalities.  Mildly enlarged ventricles and subarachnoid spaces. No intracranial hemorrhage, mass lesion or CT evidence of acute infarction.  Small right maxillary sinus retention cyst. Unremarkable bones.  IMPRESSION:  1.  No acute abnormality. 2.  Old bilateral corona radiata infarcts extending into the basal ganglia and internal capsules. 3.  Moderate chronic small vessel white matter ischemic changes in both cerebral hemispheres. 4.  Mild atrophy.  Darrol Angel, M.D.   07/25/2011 PORTABLE CHEST - 1 VIEW   Findings: Single view of the chest was obtained.  Patient has median sternotomy wires.  Heart size is grossly stable.  Slightly prominent interstitial densities without frank pulmonary edema.  No focal airspace disease.  Negative for pneumothorax.  IMPRESSION: Slightly prominent interstitial densities could represent low lung volumes versus mild vascular congestion.  No focal disease.   ADAM HENN, M.D.   07/25/2011  MRI HEAD WITHOUT CONTRAST  Technique: Multiplanar, multiecho pulse sequences of the brain and surrounding structures were obtained according to standard protocol without intravenous contrast.  Findings: 9 mm confluent area of restricted diffusion in the left corona radiata.  No mass effect or evidence of associated hemorrhage.  T2 and FLAIR hyperintensity here is superimposed on chronic confluent bilateral cerebral white matter signal abnormality, including evidence of previous bilateral white matter lacunar infarcts tracking into the internal  capsules and thalami.  No other restricted diffusion. No midline shift, mass effect, or evidence of mass lesion.  No ventriculomegaly.  Bulky dural calcifications incidentally noted at the vertex.  Major intracranial vascular flow voids are preserved although the posterior circulation appears diminutive, see MRA findings below.  Additional chronic lacunar infarcts in the left cerebellar hemisphere.  Negative pituitary, cervicomedullary junction and visualized cervical spine.  Visualized orbit soft tissues are within normal limits.  Minor paranasal sinus mucosal thickening.  Mastoids are clear.  Normal bone marrow signal.  Negative scalp soft tissues.   IMPRESSION: 1.  Small acute left hemisphere white matter infarct, small vessel type.  No mass effect or hemorrhage. 2.  Underlying advanced chronic cerebral white matter infarcts. Chronic small lacunar infarcts in the cerebellum. 3.  Diminutive appearance of the posterior circulation, see MRA findings below.    07/25/11 MRA HEAD WITHOUT CONTRAST  Findings: This study is degraded by motion.  Absent antegrade flow in the distal vertebral arteries and proximal basilar artery.  Minimal intermittent antegrade flow signal in the mid and distal basilar (series 602 image 8).  Flow appears fairly preserved at the basilar tip where bilateral posterior communicating arteries are patent.  The superior cerebellar artery origins are patent. Fetal type PCA origins, no left P1 segment identified.  There is mild to moderate irregularity of the posterior communicating arteries and proximal PCA branches.  There is preserved bilateral distal PCA flow.  Antegrade flow in both ICA siphons.  ICA irregularity without hemodynamically significant stenosis.  Carotid termini are patent. Grossly normal ophthalmic and posterior communicating artery origins.  Patent MCA and ACA origins.  Major ACA branches are within normal limits.  Irregularity of both MCA M1 segment, particulate on the right.   This could be exacerbated by the degree of motion.  Both MCA bifurcations remain patent none the left. Major bilateral M2 branches appear patent.  IMPRESSION: 1.  Poor flow or occlusion of the distal vertebral arteries and proximal basilar artery. 2.  Distal basilar and bilateral PCA flow reconstituted from the posterior communicating arteries. 3.  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  No proximal or major anterior circulation branch occlusion.   H.LEE HALL III, M.D.   07/26/11 CT HEAD WITHOUT CONTRAST Findings: Stable paranasal sinuses and mastoids. No acute osseous abnormality identified. Rightward conjugate gaze deviation. Visualized scalp soft tissues are within normal limits. Calcified atherosclerosis at the skull base. Confluent cerebral white matter hypodensity is re-identified. Stable hypodensity associated with the left corona radiata infarct. No mass effect or hemorrhage. Stable gray-white matter differentiation elsewhere. No new cortically based infarct identified. No ventriculomegaly.  No acute intracranial hemorrhage identified. No midline shift, mass effect, or evidence of mass lesion.  IMPRESSION: Expected CT appearance of the left corona radiata infarct. No mass effect or hemorrhage. Stable brain otherwise and no new intracranial abnormality.  H.LEE HALL III, M.D.  07/25/11 2D Echo Left ventricle: Wall thickness was increased in a pattern of mild LVH. There was concentric hypertrophy. Systolic function was normal. Doppler parameters are consistent with abnormal left ventricular relaxation (grade 1 diastolic dysfunction). Doppler parameters are consistent with both elevated ventricular end-diastolic filling pressure and elevated left atrial filling pressure. - Ventricular septum: The outflow septum had a sigmoid appearance. - Aorta: The aorta was mildly diseased. I cannot exclude presence of complex arch atheroma. - Mitral valve:  Calcified annulus. Mildly thickened leaflets - Atrial septum: No defect or patent foramen ovale was identified.  Assessment: 71 yo AAF with acute left hemisphere white matter infarct presenting as increased deficit in tongue/right foot strength from previous cerebellar infarcts. Regarding anticoagulation, cardiology recommends that patient be on aspirin along with Plavix.  Patient had worsening of swallowing ability prior to dicharge yesterday.  Repeat Head CT showed no acute changes.  Patient feeling well and near baseline.    LDL 140 A1C 7.3  Plan:  1. Ok to proceed with aspirin and Plavix from neuro stand point.   2.  Neuro F/U 2 months, message left on [facility] appointment line. 3.  SLP recommends: Dysphagia 1 (Puree);Nectar-thick liquid and aspiration precautions   Discussed with Dr. Regino Schultze and Dr. Waymon Amato.   LOS: 3 days   Marya Fossa PA-C [facility] [phone] 07/27/2011  7:50 AM

## 2011-07-27 NOTE — Discharge Instructions (Signed)
STROKE/TIA DISCHARGE INSTRUCTIONS SMOKING Cigarette smoking nearly doubles your risk of having a stroke & is the single most alterable risk factor  If you smoke or have smoked in the last 12 months, you are advised to quit smoking for your health.  Most of the excess cardiovascular risk related to smoking disappears within a year of stopping.  Ask you doctor about anti-smoking medications  [place] Quit Line: 1-800-QUIT NOW  Free Smoking Cessation Classes [phone]  CHOLESTEROL Know your levels; limit fat & cholesterol in your diet  Lipid Panel     Component Value Date/Time   CHOL 191 03/10/2011 0523   TRIG 52 03/10/2011 0523   HDL 44 03/10/2011 0523   CHOLHDL 4.3 03/10/2011 0523   VLDL 10 03/10/2011 0523   LDLCALC 137* 03/10/2011 0523      Many patients benefit from treatment even if their cholesterol is at goal.  Goal: Total Cholesterol (CHOL) less than 160  Goal:  Triglycerides (TRIG) less than 150  Goal:  HDL greater than 40  Goal:  LDL (LDLCALC) less than 100   BLOOD PRESSURE American Stroke Association blood pressure target is less that 120/80 mm/Hg  Your discharge blood pressure is:  BP: 103/61 mmHg  Monitor your blood pressure  Limit your salt and alcohol intake  Many individuals will require more than one medication for high blood pressure  DIABETES (A1c is a blood sugar average for last 3 months) Goal HGBA1c is under 7% (HBGA1c is blood sugar average for last 3 months)  Diabetes: {STROKE DC DIABETES:22357}    Lab Results  Component Value Date   HGBA1C 7.7* 03/10/2011     Your HGBA1c can be lowered with medications, healthy diet, and exercise.  Check your blood sugar as directed by your physician  Call your physician if you experience unexplained or low blood sugars.  PHYSICAL ACTIVITY/REHABILITATION Goal is 30 minutes at least 4 days per week    {STROKE DC ACTIVITY/REHAB:22359}  Activity decreases your risk of heart attack and stroke and makes your heart  stronger.  It helps control your weight and blood pressure; helps you relax and can improve your mood.  Participate in a regular exercise program.  Talk with your doctor about the best form of exercise for you (dancing, walking, swimming, cycling).  DIET/WEIGHT Goal is to maintain a healthy weight  Your discharge diet is: NPO *** liquids Your height is:  Height: 5\' 5"  (165.1 cm) Your current weight is: Weight: 81.8 kg (180 lb 5.4 oz) Your Body Mass Index (BMI) is:     Following the type of diet specifically designed for you will help prevent another stroke.  Your goal weight range is:  ***  Your goal Body Mass Index (BMI) is 19-24.  Healthy food habits can help reduce 3 risk factors for stroke:  High cholesterol, hypertension, and excess weight.  RESOURCES Stroke/Support Group:  Call [phone]  they meet the 3rd Sunday of the month on the Rehab Unit at [facility], [place] ( no meetings June, July & Aug).  STROKE EDUCATION PROVIDED/REVIEWED AND GIVEN TO PATIENT Stroke warning signs and symptoms How to activate emergency medical system (call 911). Medications prescribed at discharge. Need for follow-up after discharge. Personal risk factors for stroke. Pneumonia vaccine given:   {STROKE DC YES/NO/DATE:22363} Flu vaccine given:   {STROKE DC YES/NO/DATE:22363} My questions have been answered, the writing is legible, and I understand these instructions.  I will adhere to these goals & educational materials that have been provided  to me after my discharge from the hospital.

## 2011-07-27 NOTE — Progress Notes (Signed)
I personally reviewed the chart & pertinent data, examined the patient, and developed the plan of care. Patient is doing much better today. Continue aspirin and Plavix per cardiology recommendations.  Luvenia Heller, MD  Pager: [phone]

## 2011-07-27 NOTE — Progress Notes (Signed)
   CARE MANAGEMENT NOTE 07/27/2011  Patient:  [name] [name]   Account Number:  [id number]  Date Initiated:  07/25/2011  Documentation initiated by:  [name] [name]  Subjective/Objective Assessment:   PT WAS ADMITTED WITH WEAKNESS AND FOOT DROP     Action/Plan:   PROGRESSION OF CARE AND DISCHARGE PLANNING   Anticipated DC Date:  07/28/2011   Anticipated DC Plan:  ASSISTED LIVING / REST HOME  In-house referral  Clinical Social Worker      DC Associate Professor  CM consult      [facility] Choice  HOME HEALTH   Choice offered to / List presented to:  C-1 Patient   DME arranged  3-N-1      DME agency  [facility]     HH arranged  HH-2 PT  HH-3 OT  HH-5 SPEECH THERAPY      HH agency  [facility]   Status of service:  In process, will continue to follow Medicare Important Message given?   (If response is "NO", the following Medicare IM given date fields will be blank) Date Medicare IM given:   Date Additional Medicare IM given:    Discharge Disposition:  ASSISTED LIVING  Per UR Regulation:  Reviewed for med. necessity/level of care/duration of stay  If discussed at Long Length of Stay Meetings, dates discussed:    Comments:  07/27/2011 0900 Spoke to pt and she used [facility] in the past. Contacted [facility], and requested order be faxed to office. Spoke to Maralyn Sago, Charity fundraiser on call. Faxed orders, H&P, and facesheet to [facility]. Faxed request for 3n1 to [facility] for delivery to ALF on 07/28/2011. [name] Donning RN CCM Case Mgmt phone [phone]  07/25/11 [name] Boer, RN, BSN 1240 PT WAS ADMITTED WITH WEAKNESS.  PTA PT WAS AT AN ALF.  PT PLANS TO RETURN TO ALF.  PT HAS AN AIDE WITH [employer] THAT COMES OUT 3HRS/ X 3 DAYS A WK.  PT COMMUNICATES BY WRITTING SINCE LAST CVA IN 2003. WILL F/U ON DC NEEDS.

## 2011-08-25 ENCOUNTER — Ambulatory Visit
Admission: RE | Admit: 2011-08-25 | Discharge: 2011-08-25 | Disposition: A | Discharge status: Home / Self Care | Payer: Medicare HMO | Source: Ambulatory Visit | Attending: Internal Medicine | Admitting: Internal Medicine

## 2011-08-25 ENCOUNTER — Other Ambulatory Visit: Payer: Self-pay | Admitting: Internal Medicine

## 2011-08-25 DIAGNOSIS — W19XXXA Unspecified fall, initial encounter: Secondary | ICD-10-CM

## 2011-09-17 ENCOUNTER — Other Ambulatory Visit (HOSPITAL_COMMUNITY): Payer: Self-pay | Admitting: Internal Medicine

## 2012-02-11 ENCOUNTER — Encounter (HOSPITAL_COMMUNITY): Payer: Self-pay | Admitting: Emergency Medicine

## 2012-02-11 ENCOUNTER — Emergency Department (HOSPITAL_COMMUNITY)
Admission: EM | Admit: 2012-02-11 | Discharge: 2012-02-12 | Disposition: A | Discharge status: Home / Self Care | Payer: Medicare HMO | Attending: Emergency Medicine | Admitting: Emergency Medicine

## 2012-02-11 ENCOUNTER — Emergency Department (HOSPITAL_COMMUNITY): Payer: Medicare HMO

## 2012-02-11 DIAGNOSIS — S93602A Unspecified sprain of left foot, initial encounter: Secondary | ICD-10-CM

## 2012-02-11 DIAGNOSIS — W230XXA Caught, crushed, jammed, or pinched between moving objects, initial encounter: Secondary | ICD-10-CM | POA: Insufficient documentation

## 2012-02-11 DIAGNOSIS — Z8719 Personal history of other diseases of the digestive system: Secondary | ICD-10-CM | POA: Insufficient documentation

## 2012-02-11 DIAGNOSIS — E7800 Pure hypercholesterolemia, unspecified: Secondary | ICD-10-CM | POA: Insufficient documentation

## 2012-02-11 DIAGNOSIS — K219 Gastro-esophageal reflux disease without esophagitis: Secondary | ICD-10-CM | POA: Insufficient documentation

## 2012-02-11 DIAGNOSIS — E119 Type 2 diabetes mellitus without complications: Secondary | ICD-10-CM | POA: Insufficient documentation

## 2012-02-11 DIAGNOSIS — Z79899 Other long term (current) drug therapy: Secondary | ICD-10-CM | POA: Insufficient documentation

## 2012-02-11 DIAGNOSIS — S93402A Sprain of unspecified ligament of left ankle, initial encounter: Secondary | ICD-10-CM

## 2012-02-11 DIAGNOSIS — I10 Essential (primary) hypertension: Secondary | ICD-10-CM | POA: Insufficient documentation

## 2012-02-11 DIAGNOSIS — I739 Peripheral vascular disease, unspecified: Secondary | ICD-10-CM | POA: Insufficient documentation

## 2012-02-11 DIAGNOSIS — M2550 Pain in unspecified joint: Secondary | ICD-10-CM | POA: Insufficient documentation

## 2012-02-11 DIAGNOSIS — Y9389 Activity, other specified: Secondary | ICD-10-CM | POA: Insufficient documentation

## 2012-02-11 DIAGNOSIS — R05 Cough: Secondary | ICD-10-CM | POA: Insufficient documentation

## 2012-02-11 DIAGNOSIS — Z8673 Personal history of transient ischemic attack (TIA), and cerebral infarction without residual deficits: Secondary | ICD-10-CM | POA: Insufficient documentation

## 2012-02-11 DIAGNOSIS — R059 Cough, unspecified: Secondary | ICD-10-CM | POA: Insufficient documentation

## 2012-02-11 DIAGNOSIS — Z794 Long term (current) use of insulin: Secondary | ICD-10-CM | POA: Insufficient documentation

## 2012-02-11 DIAGNOSIS — S93409A Sprain of unspecified ligament of unspecified ankle, initial encounter: Secondary | ICD-10-CM | POA: Insufficient documentation

## 2012-02-11 DIAGNOSIS — I252 Old myocardial infarction: Secondary | ICD-10-CM | POA: Insufficient documentation

## 2012-02-11 DIAGNOSIS — Y929 Unspecified place or not applicable: Secondary | ICD-10-CM | POA: Insufficient documentation

## 2012-02-11 MED ORDER — HYDROMORPHONE HCL 2 MG PO TABS: 2.0000 mg | ORAL_TABLET | Freq: Once | ORAL | Status: DC

## 2012-02-11 NOTE — ED Notes (Signed)
Pt c/o left ankle pain. Foot was wedged between bathtub and motorized wheel chair. Swelling to lt ankle, no obvious deformities.

## 2012-02-11 NOTE — ED Provider Notes (Signed)
[name] [name] is a 71 y.o. female who was maneuvering her wheelchair, in her bathroom when her left ankle was pinched against a bathtub. She presents by EMS. She communicates by writing. This is chronic.  On exam, she has pain with plantar flexion, at the left ankle. There is left ankle swelling.  She has a congested cough, that sounds mostly upper airway. Her vital signs are normal.  This appears to be a purely mechanical injury.  Medical screening examination/treatment/procedure(s) were conducted as a shared visit with non-physician practitioner(s) and myself.  I personally evaluated the patient during the encounter    Flint Melter, MD 02/13/12 [phone]

## 2012-02-11 NOTE — Progress Notes (Signed)
HOME HEALTH AGENCIES SERVING [place]   Agencies that are Medicare-Certified and are affiliated with [facility]  Telephone Number Address  [facility]   [facility] has ownership interest in this company; however, you are under no obligation to use this agency. [phone] or  [phone] [address] [place], [place] [address]   Agencies that are Medicare-Certified and are not affiliated with [facility] Telephone Number Address  [facility] [phone] Fax [phone] [address], [address] [place], [place]  [address]  [facility] [phone] or [phone] Fax [phone] [address] [place], [place] [address]  [facility] [phone] Fax [phone] [address] [place], [place] [address]  [facility] [phone] Fax [phone] [address] [place], [place]  [address]  [facility] [phone] Fax [phone] [address] Suite [place], [place] [address]  [facility] [phone] [address] [place], [place] [address]  [facility] [phone]  [address] [place], [place] [address]  [facility] [phone] or [phone] Fax [phone] [address] [place], [place]  [address]  [facility] [phone] Fax [phone] [address] [place], [place]  [address]  [facility]  [phone] Fax [phone] [address] [place], [place] [address]               Agencies that are not Medicare-Certified and are not affiliated with [facility] Agency Telephone Number Address  [facility] [place] [phone] or [phone] Fax [phone] [address] [place]  [address]  [facility] [phone] Fax [phone] [address] [place], [place]  [address]  [facility]  [phone] Fax [phone] [address] [place], [place] [address]  [facility] [phone] Fax [phone] [address] [place], [place] [address]  [facility] [phone] or [phone] Fax [phone] [address] [place], [place]  [address]  [facility] [phone] or [phone] Fax [phone] [address], [address] [place], [place]  [address]  [facility] [phone] Fax [phone] [address] [place], [place]  [address]  [facility] [phone] [address] [place], [place]  [address]  [facility] [phone] Fax [phone] [address] [place], [place]  [address]  [facility] [phone] Fax [phone] [address] [place], [place]  [address]  [facility] [phone] Fax [phone] [address] [place], [place] [address]  [facility] [phone] Fax [phone] [address] [place], [place]  [address]   In to see patient to offer choice of home health agencies. Patient indicated that [facility] would like [facility]. Appropriate referral to be made.

## 2012-02-11 NOTE — ED Notes (Signed)
50mcg Fentanyl given by EMS.

## 2012-02-11 NOTE — ED Notes (Signed)
NAD noted at time of d/c home 

## 2012-02-11 NOTE — ED Provider Notes (Signed)
History     CSN: [id number]  Arrival date & time 02/11/12  1011   First MD Initiated Contact with Patient 02/11/12 1033      Chief Complaint  Patient presents with  . Foot Pain    (Consider location/radiation/quality/duration/timing/severity/associated sxs/prior treatment) HPI [name] [name] is a 71 y.o. female who presents with complaint of pain to left ankle and foot. Stats was riding on her motorized wheel chair when her left ankle got stuck between bathtub and wheel chair. States she tried to get ankle out, but couldn't. States now pain and swelling to ankle and foot. Pt is non verbal due to prior stroke, communication through writing.   Past Medical History  Diagnosis Date  . 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 Communicates by writing   . Hypertension   . Hypercholesteremia   . Complication of anesthesia     lung   . Angina   . GERD (gastroesophageal reflux disease)   . Myocardial infarction     CABG 2001, ? stent. Cath 02/2011 with new distal LIMA-LAD 80%. SVG-OM occlusion is old, rest of grafts patent  . Peripheral vascular disease   . Diabetes mellitus   . H/O hiatal hernia     Past Surgical History  Procedure Date  . Coronary artery bypass graft   . Abdominal hysterectomy   . Breast biopsy   . Esophagogastroduodenoscopy 03/12/2011    Procedure: ESOPHAGOGASTRODUODENOSCOPY (EGD);  Surgeon: Theda Belfast, MD;  Location: [facility] ENDOSCOPY;  Service: Endoscopy;  Laterality: N/A;  . Cardiac catheterization 02/2011    Family History  Problem Relation Age of Onset  . Anesthesia problems Neg Hx   . Hypotension Neg Hx   . Malignant hyperthermia Neg Hx   . Pseudochol deficiency Neg Hx     History  Substance Use Topics  . Smoking status: Never Smoker   . Smokeless tobacco: Not on file  . Alcohol Use: No    OB History    Grav Para Term Preterm Abortions TAB SAB Ect Mult Living                  Review of Systems  Constitutional:  Negative for fever and chills.  Respiratory: Positive for cough. Negative for chest tightness and shortness of breath.   Musculoskeletal: Positive for joint swelling and arthralgias.  Skin: Negative.   Neurological: Negative for weakness and numbness.    Allergies  Penicillins  Home Medications   Current Outpatient Rx  Name  Route  Sig  Dispense  Refill  . CLOPIDOGREL BISULFATE 75 MG PO TABS   Oral   Take 75 mg by mouth daily.           Marland Kitchen ESCITALOPRAM OXALATE 20 MG PO TABS   Oral   Take 20 mg by mouth daily.         Marland Kitchen HYDROCHLOROTHIAZIDE 25 MG PO TABS   Oral   Take 25 mg by mouth daily.           . INSULIN ASPART PROT & ASPART (70-30) 100 UNIT/ML [place] SUSP   Subcutaneous   Inject 25-34 Units into the skin 2 (two) times daily. 34 units in AM and 25 units in PM         . NITROGLYCERIN 0.4 MG SL SUBL   Sublingual   Place 0.4 mg under the tongue every 5 (five) minutes as needed. For chest pain         . ROSUVASTATIN  CALCIUM 40 MG PO TABS   Oral   Take 40 mg by mouth at bedtime.             BP 140/72  Pulse 59  Temp 98.5 F (36.9 C)  Resp 16  SpO2 98%  Physical Exam  Nursing note and vitals reviewed. Constitutional: She is oriented to person, place, and time. She appears well-developed and well-nourished.       Non verbal  Eyes: Conjunctivae normal are normal.  Cardiovascular: Normal rate, regular rhythm and normal heart sounds.   No murmur heard. Pulmonary/Chest: Effort normal and breath sounds normal. No respiratory distress. She has no wheezes. She has no rales.  Musculoskeletal:       Mild left ankle swelling. Tender over medial and lateral malleolus. Tender over entire dorsum of the foot. Pain with ankle dorsiflexion and plantarflexion, inversion and eversion, actively and passively. Pt able to move all toes with no pain. Normal knee and hip. Right lower leg in metal brace  Neurological: She is alert and oriented to person, place, and time.  Skin:  Skin is warm and dry.    ED Course  Procedures (including critical care time)  Pt with left ankle injury, injury is mechanical, no fall, no other complaints. Will get x-rays.   Dg Ankle Complete Left  02/11/2012  *RADIOLOGY REPORT*  Clinical Data: Fall with left ankle soreness and pain.  LEFT ANKLE COMPLETE - 3+ VIEW  Comparison: None.  Findings: Three-view exam shows no fracture.  No subluxation or dislocation.  The lateral film has a suggestion of some linear lucency along the posterior aspect of the distal tibia, but no cortical disruption is evident in this may be related to superimposed soft tissue.  No fluid is evident within the tibiotalar joint anteriorly.  IMPRESSION: No definite evidence for acute fracture.   Original Report Authenticated By: Kennith Center, M.D.    Dg Foot Complete Left  02/11/2012  *RADIOLOGY REPORT*  Clinical Data: Fall.  Foot pain.  LEFT FOOT - COMPLETE 3+ VIEW  Comparison: The  Findings: Three-view exam shows no fracture.  No subluxation or dislocation.  No worrisome lytic or sclerotic osseous abnormality.  IMPRESSION: No acute bony findings.   Original Report Authenticated By: Kennith Center, M.D.       1. Sprain of left foot   2. Left ankle sprain       MDM  Pt with left ankle and foot injury. No other complaints. ACE wrap for swelling and comfort. Pt is non ambulatory. Asked for option for home PT and OT. Spoke with Child psychotherapist, placed order, they will help to get it set up for her. Will follow up with pcp         Lottie Mussel, PA 02/11/12 1235

## 2012-02-11 NOTE — Progress Notes (Deleted)
Henderson Cloud, MD Physician Signed Internal Medicine Progress Notes 02/11/2012 11:56 AM                                                                                                                                                    [facility]                                                                                                                                                 History and Physical       PCP:   Willow Ora, MD     Chief Complaint:   Chest pain   HPI: 71 y/o man with h/o CAD, combined CHF, a fib s/p ablation and PPM off of anticoagulation 2/2 recent intracranial bleed, DM and HTN. Presents to the hospital with complaints of CP that began this morning while in bed, non-radiating, not relieved by nitroglycerin and associated with a productive cough. He had an admission to the hospital in October for CP as well, and this was deemed to be non-cardiac in origen. He recently saw his cardiologist, Dr. Ladona Ridgel on 12/3 who up titrated his lasix for a few days due to some weight gain that was attributed to dietary indiscretion over the Thanksgiving Holiday. We have been asked to admit him for further evaluation and management.   Allergies:    Allergies   Allergen  Reactions   .  Avelox (Moxifloxacin Hydrochloride)  Itching       Headache   .  Tadalafil         headache, backache        Past Medical History   Diagnosis  Date   .  HTN (hypertension)     .  HLD (hyperlipidemia)     .  DM (diabetes mellitus)  1999       adult onset   .  BPH (benign prostatic hyperplasia)         Saw Dr Wanda Plump 2004, normal renal u/s   .  Leg ulcer  2011   .  CHF (congestive heart failure)         Thought primarily to be non-systolic although EF down (EF 11-91%  04/2010, down to 35-40% 01/19/11), cath 2008 with no CAD, nuclear study 11/2010 showing Small area of reversibility in the distal ant/lat wall the left ventricle suspicious for ischemia/septal wall HK but  felt to be low risk  (per D/C Summary 11/2010)   .  Pleural effusion  2008       S/p decortication   .  Insomnia     .  Atrial fibrillation         s/p AV node ablation & BiV PPM implantation 01/22/11 (op dictation pending)   .  Sinoatrial node dysfunction     .  Headache     .  Pulmonary HTN     .  Coronary artery disease     .  Anginal pain     .  Shortness of breath     .  Peripheral vascular disease     .  Pneumonia     .  Arthritis     .  Anemia         intermittent       Past Surgical History   Procedure  Date   .  Pleural scarification     .  Lung decortication     .  Colonoscopy  07/24/10       normal   .  Pneumothorax with fibrothorax     .  Insert / replace / remove pacemaker  01/22/11       pacemaker placement   .  Tonsillectomy         Prior to Admission medications    Medication  Sig  Start Date  End Date  Taking?  Authorizing Provider   amLODipine (NORVASC) 10 MG tablet  Take 1 tablet (10 mg total) by mouth daily.  11/24/11    Yes  Laveda Norman, MD   carvedilol (COREG) 25 MG tablet  Take 1.5 tablets (37.5 mg total) by mouth 2 (two) times daily with a meal.  09/22/11  09/21/12  Yes  Wanda Plump, MD   escitalopram (LEXAPRO) 10 MG tablet  Take 1 tablet (10 mg total) by mouth at bedtime.  11/24/11    Yes  Laveda Norman, MD   furosemide (LASIX) 40 MG tablet  Take 1.5 tablets (60 mg total) by mouth 2 (two) times daily.  09/22/11    Yes  Wanda Plump, MD   glyBURIDE (DIABETA) 5 MG tablet  Take 5 mg by mouth daily.  12/10/11    Yes  Lesle Chris Black, NP   losartan (COZAAR) 100 MG tablet  Take 1 tablet (100 mg total) by mouth daily.  09/22/11    Yes  Wanda Plump, MD   Multiple Vitamins-Minerals (MULTIVITAMINS THER. W/MINERALS) TABS  Take 1 tablet by mouth daily.        Yes  Historical Provider, MD   niacin-lovastatin (ADVICOR) 1000-20 MG 24 hr tablet  Take 1 tablet by mouth at bedtime.  09/22/11    Yes  Wanda Plump, MD   nitroGLYCERIN (NITROSTAT) 0.4 MG SL tablet  Place 1 tablet (0.4 mg  total) under the tongue every 5 (five) minutes as needed for chest pain.  11/24/11    Yes  Laveda Norman, MD   pantoprazole (PROTONIX) 40 MG tablet  Take 40 mg by mouth every morning.      Yes  Historical Provider, MD   sitaGLIPtin (JANUVIA) 100 MG tablet  Take 1 tablet (100 mg total) by mouth daily.  09/22/11  Yes  Wanda Plump, MD   zolpidem (AMBIEN) 10 MG tablet  Take 1 tablet (10 mg total) by mouth at bedtime as needed. For sleep  09/22/11    Yes  Wanda Plump, MD        Social History: reports that he quit smoking about 31 years ago. His smoking use included Cigarettes. He has a 62.5 pack-year smoking history. He has never used smokeless tobacco. He reports that he drinks alcohol. He reports that he does not use illicit drugs.    Family History   Problem  Relation  Age of Onset   .  Diabetes  Father     .  Breast cancer  Maternal Aunt     .  Heart attack  Neg Hx     .  Prostate cancer  Neg Hx     .  Colon cancer  Neg Hx     .  Cancer  Neg Hx         colon or prostate   .  Coronary artery disease  Father     .  Polycythemia  Mother        Review of Systems:  Constitutional: Denies fever, chills, diaphoresis, appetite change and fatigue.  HEENT: Denies photophobia, eye pain, redness, hearing loss, ear pain, congestion, sore throat, rhinorrhea, sneezing, mouth sores, trouble swallowing, neck pain, neck stiffness and tinnitus.   Respiratory: Denies wheezing.   Cardiovascular: Denies  palpitations and leg swelling.  Gastrointestinal: Denies nausea, vomiting, abdominal pain, diarrhea, constipation, blood in stool and abdominal distention.  Genitourinary: Denies dysuria, urgency, frequency, hematuria, flank pain and difficulty urinating.  Musculoskeletal: Denies myalgias, back pain, joint swelling, arthralgias and gait problem.  Skin: Denies pallor, rash and wound.  Neurological: Denies dizziness, seizures, syncope, weakness, light-headedness, numbness and headaches.  Hematological: Denies  adenopathy. Easy bruising, personal or family bleeding history  Psychiatric/Behavioral: Denies suicidal ideation, mood changes, confusion, nervousness, sleep disturbance and agitation     Physical Exam: Blood pressure 118/66, pulse 73, temperature 98.1 F (36.7 C), temperature source Oral, resp. rate 24, weight 95.255 kg (210 lb), SpO2 95.00%. Gen: AA Ox3, NAD. No current CP. HEENT: [place]/AT/PERRLA, moist mucous membranes. Neck: supple, no JVD, no LAD, no bruits, no goiter. CV: RRR, no M/R/G. Lungs: CTA B. No wheezes. Abd: S/NT/ND/+BS/no masses or organomegaly noted. Ext: no C/C//E, +pedal pulses. Neuro: grossly intact and non-focal.   Labs on Admission:  Results for orders placed during the hospital encounter of 02/11/12 (from the past 48 hour(s))   GLUCOSE, CAPILLARY     Status: Abnormal     Collection Time     02/11/12  7:58 AM       Component  Value  Range  Comment     Glucose-Capillary  150 (*)  70 - 99 mg/dL     CBC     Status: Abnormal     Collection Time     02/11/12  8:34 AM       Component  Value  Range  Comment     WBC  12.1 (*)  4.0 - 10.5 K/uL       RBC  4.17 (*)  4.22 - 5.81 MIL/uL       Hemoglobin  12.9 (*)  13.0 - 17.0 g/dL       HCT  47.8 (*)  29.5 - 52.0 %       MCV  91.4   78.0 - 100.0 fL  MCH  30.9   26.0 - 34.0 pg       MCHC  33.9   30.0 - 36.0 g/dL       RDW  04.5   40.9 - 15.5 %       Platelets  171   150 - 400 K/uL     COMPREHENSIVE METABOLIC PANEL     Status: Abnormal     Collection Time     02/11/12  8:34 AM       Component  Value  Range  Comment     Sodium  137   135 - 145 mEq/L       Potassium  3.7   3.5 - 5.1 mEq/L       Chloride  99   96 - 112 mEq/L       CO2  28   19 - 32 mEq/L       Glucose, Bld  157 (*)  70 - 99 mg/dL       BUN  14   6 - 23 mg/dL       Creatinine, Ser  0.89   0.50 - 1.35 mg/dL       Calcium  8.8   8.4 - 10.5 mg/dL       Total Protein  7.1   6.0 - 8.3 g/dL       Albumin  3.3 (*)  3.5 - 5.2 g/dL       AST  16   0 -  37 U/L       ALT  12   0 - 53 U/L       Alkaline Phosphatase  83   39 - 117 U/L       Total Bilirubin  1.1   0.3 - 1.2 mg/dL       GFR calc non Af Amer  84 (*)  >90 mL/min       GFR calc Af Amer  >90   >90 mL/min     POCT I-STAT TROPONIN I     Status: Normal     Collection Time     02/11/12  8:56 AM       Component  Value  Range  Comment     Troponin i, poc  0.00   0.00 - 0.08 ng/mL       Comment 3                   Radiological Exams on Admission: Dg Chest Portable 1 View   02/11/2012  *RADIOLOGY REPORT*  Clinical Data: Chest pain, cough  PORTABLE CHEST - 1 VIEW  Comparison: 12/07/2011  Findings: Cardiomegaly again noted.  Dual lead cardiac pacemaker is unchanged in position.  There is hazy perihilar and bilateral lower lobe airspace disease which may be due to pulmonary edema or bilateral infiltrate.  Clinical correlation is necessary.  IMPRESSION: Cardiomegaly again noted.  Stable dual lead cardiac pacemaker position.  Hazy perihilar and bilateral lower lobe airspace disease may be due to bilateral infiltrates or evolving pulmonary edema. Clinical correlation is necessary.   Original Report Authenticated By: Natasha Mead, M.D.      Assessment/Plan Principal Problem:  *Chest pain Active Problems:  HYPERLIPIDEMIA  HYPERTENSION  Diabetes mellitus  CAP (community acquired pneumonia)  Chronic combined systolic and diastolic CHF (congestive heart failure)   Chest Pain -Doubt cardiac in origen. -First troponin is negative, EKG non-acute, no response to nitroglycerin. -Likely related to PNA.   CAP -Has been started  on rocephin and azithro, which I will continue. -Get Blood/sputum cx. -Strep pneumo/Legionella urine antigens.   DM -Check A1c. -SSI plus continue oral hypoglycemic agents.   HTN -Well controlled.   Chronic Combined CHF -No signs of acute exacerbation. -Check BNP. -Saline lock. -Continue home dose of lasix (60 mg BID).   DVT  Prophylaxis -Lovenox.   Disposition -Admit for IV antibiotics for PNA and CP rule out.     Time Spent on Admission: 75 minutes.   Chaya Jan [facility] Pager: [phone] 02/11/2012, 11:56 AM           Routing History...     Date/Time From To Method   02/11/2012 12:11 PM Estela Isaiah Blakes, MD Wanda Plump, MD In [place]

## 2012-02-11 NOTE — ED Notes (Signed)
[facility] called for transport home. 

## 2012-02-14 NOTE — ED Provider Notes (Signed)
Medical screening examination/treatment/procedure(s) were performed by non-physician practitioner and as supervising physician I was immediately available for consultation/collaboration.  Sunnie Nielsen, MD 02/14/12 [phone]

## 2012-05-04 ENCOUNTER — Emergency Department (HOSPITAL_COMMUNITY): Payer: Medicare HMO

## 2012-05-04 ENCOUNTER — Observation Stay (HOSPITAL_COMMUNITY)
Admission: EM | Admit: 2012-05-04 | Discharge: 2012-05-05 | Disposition: A | Discharge status: Home / Self Care | Payer: Medicare HMO | Attending: Internal Medicine | Admitting: Internal Medicine

## 2012-05-04 ENCOUNTER — Encounter (HOSPITAL_COMMUNITY): Payer: Self-pay | Admitting: *Deleted

## 2012-05-04 DIAGNOSIS — R29898 Other symptoms and signs involving the musculoskeletal system: Secondary | ICD-10-CM | POA: Insufficient documentation

## 2012-05-04 DIAGNOSIS — I639 Cerebral infarction, unspecified: Secondary | ICD-10-CM | POA: Diagnosis present

## 2012-05-04 DIAGNOSIS — E7800 Pure hypercholesterolemia, unspecified: Secondary | ICD-10-CM | POA: Insufficient documentation

## 2012-05-04 DIAGNOSIS — E119 Type 2 diabetes mellitus without complications: Secondary | ICD-10-CM

## 2012-05-04 DIAGNOSIS — I2510 Atherosclerotic heart disease of native coronary artery without angina pectoris: Secondary | ICD-10-CM | POA: Insufficient documentation

## 2012-05-04 DIAGNOSIS — I6529 Occlusion and stenosis of unspecified carotid artery: Secondary | ICD-10-CM | POA: Insufficient documentation

## 2012-05-04 DIAGNOSIS — I69920 Aphasia following unspecified cerebrovascular disease: Secondary | ICD-10-CM | POA: Insufficient documentation

## 2012-05-04 DIAGNOSIS — Z951 Presence of aortocoronary bypass graft: Secondary | ICD-10-CM | POA: Insufficient documentation

## 2012-05-04 DIAGNOSIS — I6350 Cerebral infarction due to unspecified occlusion or stenosis of unspecified cerebral artery: Secondary | ICD-10-CM

## 2012-05-04 DIAGNOSIS — R5381 Other malaise: Secondary | ICD-10-CM

## 2012-05-04 DIAGNOSIS — I69959 Hemiplegia and hemiparesis following unspecified cerebrovascular disease affecting unspecified side: Secondary | ICD-10-CM | POA: Insufficient documentation

## 2012-05-04 DIAGNOSIS — I252 Old myocardial infarction: Secondary | ICD-10-CM | POA: Insufficient documentation

## 2012-05-04 DIAGNOSIS — G459 Transient cerebral ischemic attack, unspecified: Principal | ICD-10-CM | POA: Insufficient documentation

## 2012-05-04 DIAGNOSIS — I739 Peripheral vascular disease, unspecified: Secondary | ICD-10-CM | POA: Insufficient documentation

## 2012-05-04 DIAGNOSIS — E785 Hyperlipidemia, unspecified: Secondary | ICD-10-CM | POA: Insufficient documentation

## 2012-05-04 DIAGNOSIS — I10 Essential (primary) hypertension: Secondary | ICD-10-CM

## 2012-05-04 DIAGNOSIS — R112 Nausea with vomiting, unspecified: Secondary | ICD-10-CM

## 2012-05-04 DIAGNOSIS — I69992 Facial weakness following unspecified cerebrovascular disease: Secondary | ICD-10-CM | POA: Insufficient documentation

## 2012-05-04 LAB — DIFFERENTIAL
Basophils Absolute: 0 10*3/uL (ref 0.0–0.1)
Basophils Relative: 0 % (ref 0–1)
Eosinophils Absolute: 0 10*3/uL (ref 0.0–0.7)
Eosinophils Relative: 1 % (ref 0–5)
Neutrophils Relative %: 71 % (ref 43–77)

## 2012-05-04 LAB — COMPREHENSIVE METABOLIC PANEL
ALT: 14 U/L (ref 0–35)
AST: 19 U/L (ref 0–37)
Albumin: 3.5 g/dL (ref 3.5–5.2)
Alkaline Phosphatase: 57 U/L (ref 39–117)
Calcium: 9.6 mg/dL (ref 8.4–10.5)
GFR calc Af Amer: 79 mL/min — ABNORMAL LOW (ref >90)
Potassium: 4 mEq/L (ref 3.5–5.1)
Sodium: 139 mEq/L (ref 135–145)
Total Protein: 7.3 g/dL (ref 6.0–8.3)

## 2012-05-04 LAB — CBC
MCH: 26.4 pg (ref 26.0–34.0)
MCV: 81.9 fL (ref 78.0–100.0)
Platelets: 177 10*3/uL (ref 150–400)
RBC: 4.54 MIL/uL (ref 3.87–5.11)
RDW: 13.9 % (ref 11.5–15.5)
WBC: 6.3 10*3/uL (ref 4.0–10.5)

## 2012-05-04 LAB — POCT I-STAT, CHEM 8
Chloride: 107 mEq/L (ref 96–112)
Creatinine, Ser: 0.8 mg/dL (ref 0.50–1.10)
Hemoglobin: 12.9 g/dL (ref 12.0–15.0)
Potassium: 4 mEq/L (ref 3.5–5.1)
Sodium: 143 mEq/L (ref 135–145)

## 2012-05-04 LAB — URINALYSIS, ROUTINE W REFLEX MICROSCOPIC
Bilirubin Urine: NEGATIVE
Glucose, UA: NEGATIVE mg/dL
Hgb urine dipstick: NEGATIVE
Specific Gravity, Urine: 1.021 (ref 1.005–1.030)
Urobilinogen, UA: 1 mg/dL (ref 0.0–1.0)
pH: 5.5 (ref 5.0–8.0)

## 2012-05-04 LAB — PROTIME-INR
INR: 1.01 (ref 0.00–1.49)
Prothrombin Time: 13.2 seconds (ref 11.6–15.2)

## 2012-05-04 LAB — GLUCOSE, CAPILLARY: Glucose-Capillary: 93 mg/dL (ref 70–99)

## 2012-05-04 LAB — LIPASE, BLOOD: Lipase: 30 U/L (ref 11–59)

## 2012-05-04 LAB — POCT I-STAT TROPONIN I

## 2012-05-04 NOTE — ED Notes (Addendum)
Pt in from [facility], per Ems the pt has hx of stroke with R sided deficits, per report from staff pt has more defined R sided deficits, pt normally aphasic & responds by writing sentences, pt c/o Gaylyn Rong, pt answers questions appropriately with EDP exam, [place], MD notified upon pt arrival, last seen normal @ 15:15, symptoms recognized at 15:15

## 2012-05-04 NOTE — Consult Note (Signed)
Referring Physician: Dr. Rubin Payor    Chief Complaint: Neil Crouch  HPI: [name] [name] is an 72 y.o. female who complains of vomiting around 1100 on 05/04/2012 and then became diffusely weak around 1200. She thinks that her RLE may be weaker than normal, also complains of posterior headache. 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 When patient arrived, seen by ER MD and called CODE STROKE.  Patients symptoms appear to be at baseline: therefore no tPA. Only other complaint is that patient has had chills over the past few days. No cough. Patient can communicate with writing on paper.  Date last known well: 05/04/2012 Time last known well: 1200 NIHSS=10  tPA Given: No: Patient was >3 hours-extended time frame, diabetic, previous stroke, symptoms felt to be at baseline  Past Medical History  Diagnosis Date  . 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 Communicates by writing   . Hypertension   . Hypercholesteremia   . Complication of anesthesia     lung   . Angina   . GERD (gastroesophageal reflux disease)   . Myocardial infarction     CABG 2001, ? stent. Cath 02/2011 with new distal LIMA-LAD 80%. SVG-OM occlusion is old, rest of grafts patent  . Peripheral vascular disease   . Diabetes mellitus   . H/O hiatal hernia     Past Surgical History  Procedure Laterality Date  . Coronary artery bypass graft    . Abdominal hysterectomy    . Breast biopsy    . Esophagogastroduodenoscopy  03/12/2011    Procedure: ESOPHAGOGASTRODUODENOSCOPY (EGD);  Surgeon: Theda Belfast, MD;  Location: [facility] ENDOSCOPY;  Service: Endoscopy;  Laterality: N/A;  . Cardiac catheterization  02/2011    Family History  Problem Relation Age of Onset  . Anesthesia problems Neg Hx   . Hypotension Neg Hx   . Malignant hyperthermia Neg  Hx   . Pseudochol deficiency Neg Hx    Social History:  reports that she has never smoked. She does not have any smokeless tobacco history on file. She reports that she does not drink alcohol or use illicit drugs.  Allergies:  Allergies  Allergen Reactions  . Penicillins     No current facility-administered medications for this encounter.   Current Outpatient Prescriptions  Medication Sig Dispense Refill  . clopidogrel (PLAVIX) 75 MG tablet Take 75 mg by mouth daily.        Marland Kitchen escitalopram (LEXAPRO) 20 MG tablet Take 20 mg by mouth daily.      . hydrochlorothiazide (HYDRODIURIL) 25 MG tablet Take 25 mg by mouth daily.        . insulin aspart protamine-insulin aspart (NOVOLOG 70/30) (70-30) 100 UNIT/ML injection Inject 25-34 Units into the skin 2 (two) times daily. 34 units in AM and 25 units in PM      . nitroGLYCERIN (NITROSTAT) 0.4 MG SL tablet Place 0.4 mg under the tongue every 5 (five) minutes as needed. For chest pain      . rosuvastatin (CRESTOR) 40 MG tablet Take 40 mg by mouth at bedtime.           ROS: History obtained from the patient  General ROS: negative for -  fatigue, fever, night sweats, weight gain or weight loss, ++chills Psychological ROS: negative for - behavioral disorder, hallucinations, memory difficulties, mood swings or suicidal  ideation Ophthalmic ROS: negative for - blurry vision, double vision, eye pain or loss of vision ENT ROS: negative for - epistaxis, nasal discharge, oral lesions, sore throat, tinnitus or vertigo Allergy and Immunology ROS: negative for - hives or itchy/watery eyes Hematological and Lymphatic ROS: negative for - bleeding problems, bruising or swollen lymph nodes Endocrine ROS: negative for - galactorrhea, hair pattern changes, polydipsia/polyuria or temperature intolerance Respiratory ROS: negative for - cough, hemoptysis, shortness of breath or wheezing Cardiovascular ROS: negative for - chest pain, dyspnea on exertion, edema or  irregular heartbeat Gastrointestinal ROS: negative for - abdominal pain, diarrhea, hematemesis, nausea/vomiting or stool incontinence Genito-Urinary ROS: negative for - dysuria, hematuria, incontinence or urinary frequency/urgency Musculoskeletal ROS: negative for - joint swelling or muscular weakness, wears brace on left leg Neurological ROS: as noted in HPI Dermatological ROS: negative for rash and skin lesion changes   Physical Examination: Blood pressure 131/65, pulse 70, temperature 98.1 F (36.7 C), temperature source Oral, resp. rate 19, SpO2 100.00%.  Neurologic Examination: Mental Status: Alert, oriented, thought content appropriate.  Patient aphasic. Communicates by writing  Able to follow 2 step commands without difficulty. Cranial Nerves: II: visual fields grossly normal, pupils equal, round, reactive to light and accommodation III,IV, VI: ptosis not present, extra-ocular motions intact bilaterally V,VII: smile asymmetric, right facial droop, facial light touch sensation normal bilaterally VIII: hearing normal bilaterally IX,X: gag reflex present XI: trapezius strength/neck flexion strength normal bilaterally XII: tongue strength normal  Motor: Right : Upper extremity   2/5    Left:     Upper extremity   5/5  Lower extremity   2/5     Lower extremity   5/5 Increased tone right sided. no atrophy noted Sensory: Pinprick and light touch intact throughout, bilaterally Cerebellar: normal finger-to-nose. Gait deferred due to patient safety concerns.   Results for orders placed during the hospital encounter of 05/04/12 (from the past 48 hour(s))  POCT I-STAT, CHEM 8     Status: Abnormal   Collection Time    05/04/12  4:18 PM      Result Value Range   Sodium 143  135 - 145 mEq/L   Potassium 4.0  3.5 - 5.1 mEq/L   Chloride 107  96 - 112 mEq/L   BUN 16  6 - 23 mg/dL   Creatinine, Ser 7.82  0.50 - 1.10 mg/dL   Glucose, Bld 956 (*) 70 - 99 mg/dL   Calcium, Ion 2.13  0.86 -  1.30 mmol/L   TCO2 28  0 - 100 mmol/L   Hemoglobin 12.9  12.0 - 15.0 g/dL   HCT 57.8  46.9 - 62.9 %   Ct Head Wo Contrast  05/04/2012  *RADIOLOGY REPORT*  Clinical Data: Code stroke, right-sided weakness, occipital headache  CT HEAD WITHOUT CONTRAST  Technique:  Contiguous axial images were obtained from the base of the skull through the vertex without contrast.  Comparison: 07/26/2011  Findings: Stable brain atrophy and microvascular ischemic change in the periventricular white matter.  Remote appearing basal ganglia and bilateral corona radiata infarcts evident.  No acute intracranial hemorrhage, definite acute infarction, focal edema, mass effect, midline shift, hydrocephalus, herniation, or extra- axial fluid collection.  Extensive dural calcifications noted. Cisterns patent.  No cerebellar abnormality.  Orbits are symmetric. Mastoids are clear.  Minor mucosal thickening in the right maxillary sinus.  Other sinuses clear.  IMPRESSION: Stable atrophy and chronic ischemic changes.  No significant interval change or acute process by noncontrast CT.  Findings  called to Dr. Roseanne Reno 05/04/2012 at 4:00 p.m.   Original Report Authenticated By: Judie Petit. Miles Costain, M.D.     Carotid dopplers 07/25/2011 No significant intra/extracranial artery stenosis  2D Echo 05/25/2011 Normal systolic function, mild LVH, no ASD or PFO was identified.   Assessment: 72 y.o. female vomiting followed by weakness. Patient appears to be at her baseline which is aphasic with right hemiparesis. Previous basal ganglia and bilateral corona radiata infarcts. Continue stroke workup.  Stroke Risk Factors - diabetes mellitus, hyperlipidemia, hypertension and periperal vascular disease   Plan: 1. HgbA1c, fasting lipid panel- update 2. MRI, MRA  of the brain without contrast 3. PT consult, OT consult, Speech consult 4. Echocardiogram, update 5. Carotid dopplers, update 6. Prophylactic therapy-Antiplatelet med: Plavix - dose 75mg  7.  Risk factor modification 8. Telemetry monitoring 9. Frequent neuro checks   Job Founds, MBA, MHA [facility] Pager [phone] 05/04/2012, 4:24 PM  This patient was personally examined by me, and I approve the above clinical assessment and management recommendations.  C.R. Roseanne Reno, MD [facility] [phone]

## 2012-05-04 NOTE — ED Notes (Signed)
Rapid Response RN Calling [facility] for more details on neuro baseline & onset of symptoms

## 2012-05-04 NOTE — H&P (Signed)
PCP:   Ralene Ok, MD   Chief Complaint:  weakness  HPI: 72 yo female h/o cva with residual rt sided hemiparesis/aphasia comes in with episode of generalized weakness that has resolved.  She lives at home alone and has a nurse aid daily, she is able to get herself in and out of her wheelchair daily and actually is still fairly independent.  Has been having several episodes of n/v nonbloody no diarrhea.  No fevers.  No cough.  ems was called and code stroke was called.  Pt is back to her baseline status and can answer questions only yes/no by nodding head, she is nonverbal from prev stroke.  Her daughters are present and also provide some history.  Neuro has seen and recommend full cva w/u.  Review of Systems:  Positive and negative as per HPI otherwise all other systems are negative  Past Medical History: Past Medical History  Diagnosis Date  . 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 Communicates by writing   . Hypertension   . Hypercholesteremia   . Complication of anesthesia     lung   . Angina   . GERD (gastroesophageal reflux disease)   . Myocardial infarction     CABG 2001, ? stent. Cath 02/2011 with new distal LIMA-LAD 80%. SVG-OM occlusion is old, rest of grafts patent  . Peripheral vascular disease   . Diabetes mellitus   . H/O hiatal hernia    Past Surgical History  Procedure Laterality Date  . Coronary artery bypass graft    . Abdominal hysterectomy    . Breast biopsy    . Esophagogastroduodenoscopy  03/12/2011    Procedure: ESOPHAGOGASTRODUODENOSCOPY (EGD);  Surgeon: Theda Belfast, MD;  Location: [facility] ENDOSCOPY;  Service: Endoscopy;  Laterality: N/A;  . Cardiac catheterization  02/2011    Medications: Prior to Admission medications   Medication Sig Start Date End Date Taking? Authorizing Provider  clopidogrel (PLAVIX) 75 MG tablet Take 75 mg by mouth daily.      Historical Provider, MD  escitalopram (LEXAPRO) 20 MG tablet Take 20 mg by  mouth daily.    Historical Provider, MD  hydrochlorothiazide (HYDRODIURIL) 25 MG tablet Take 25 mg by mouth daily.      Historical Provider, MD  insulin aspart protamine-insulin aspart (NOVOLOG 70/30) (70-30) 100 UNIT/ML injection Inject 25-34 Units into the skin 2 (two) times daily. 34 units in AM and 25 units in PM    Historical Provider, MD  nitroGLYCERIN (NITROSTAT) 0.4 MG SL tablet Place 0.4 mg under the tongue every 5 (five) minutes as needed. For chest pain 03/13/11 03/12/12  Calvert Cantor, MD  rosuvastatin (CRESTOR) 40 MG tablet Take 40 mg by mouth at bedtime.      Historical Provider, MD    Allergies:   Allergies  Allergen Reactions  . Penicillins     Social History:  reports that she has never smoked. She does not have any smokeless tobacco history on file. She reports that she does not drink alcohol or use illicit drugs.  Family History: Family History  Problem Relation Age of Onset  . Anesthesia problems Neg Hx   . Hypotension Neg Hx   . Malignant hyperthermia Neg Hx   . Pseudochol deficiency Neg Hx     Physical Exam: Filed Vitals:   05/04/12 1547  BP: 131/65  Pulse: 70  Temp: 98.1 F (36.7 C)  TempSrc: Oral  Resp: 19  SpO2: 100%   General appearance: alert, cooperative  and no distress Head: Normocephalic, without obvious abnormality, atraumatic Eyes: negative Nose: Nares normal. Septum midline. Mucosa normal. No drainage or sinus tenderness. Neck: no JVD and supple, symmetrical, trachea midline Lungs: clear to auscultation bilaterally Heart: regular rate and rhythm, S1, S2 normal, no murmur, click, rub or gallop Abdomen: soft, non-tender; bowel sounds normal; no masses,  no organomegaly Extremities: extremities normal, atraumatic, no cyanosis or edema Pulses: 2+ and symmetric Skin: Skin color, texture, turgor normal. No rashes or lesions Neurologic: Cranial nerves: normal  Rt sided hemiparesis.  Nonverbal.      Labs on Admission:   Recent Labs   05/04/12 1556 05/04/12 1618  NA 139 143  K 4.0 4.0  CL 104 107  CO2 25  --   GLUCOSE 234* 237*  BUN 15 16  CREATININE 0.84 0.80  CALCIUM 9.6  --     Recent Labs  05/04/12 1556  AST 19  ALT 14  ALKPHOS 57  BILITOT 0.3  PROT 7.3  ALBUMIN 3.5    Recent Labs  05/04/12 1808  LIPASE 30    Recent Labs  05/04/12 1556 05/04/12 1618  WBC 6.3  --   NEUTROABS 4.4  --   HGB 12.0 12.9  HCT 37.2 38.0  MCV 81.9  --   PLT 177  --     Recent Labs  05/04/12 1556  TROPONINI <0.30   Radiological Exams on Admission: Ct Head Wo Contrast  05/04/2012  *RADIOLOGY REPORT*  Clinical Data: Code stroke, right-sided weakness, occipital headache  CT HEAD WITHOUT CONTRAST  Technique:  Contiguous axial images were obtained from the base of the skull through the vertex without contrast.  Comparison: 07/26/2011  Findings: Stable brain atrophy and microvascular ischemic change in the periventricular white matter.  Remote appearing basal ganglia and bilateral corona radiata infarcts evident.  No acute intracranial hemorrhage, definite acute infarction, focal edema, mass effect, midline shift, hydrocephalus, herniation, or extra- axial fluid collection.  Extensive dural calcifications noted. Cisterns patent.  No cerebellar abnormality.  Orbits are symmetric. Mastoids are clear.  Minor mucosal thickening in the right maxillary sinus.  Other sinuses clear.  IMPRESSION: Stable atrophy and chronic ischemic changes.  No significant interval change or acute process by noncontrast CT.  Findings called to Dr. Roseanne Reno 05/04/2012 at 4:00 p.m.   Original Report Authenticated By: Judie Petit. Miles Costain, M.D.    Dg Chest Port 1 View  05/04/2012  *RADIOLOGY REPORT*  Clinical Data: Stroke  PORTABLE CHEST - 1 VIEW  Comparison: Chest radiograph 07/25/2011  Findings: Normal mediastinum and cardiac silhouette.  Costophrenic angles are clear.  No effusion, infiltrate, or pneumothorax.  IMPRESSION: No acute cardiopulmonary process.    Original Report Authenticated By: Genevive Bi, M.D.    Dg Abd 2 Views  05/04/2012  *RADIOLOGY REPORT*  Clinical Data: Abdominal pain  ABDOMEN - 2 VIEW  Comparison: CT 08/15/2008  Findings: Moderate stool volume noted throughout nondilated colon. No dilated loop of small bowel.  No air fluid level.  Median sternotomy wires partly visualized.  Most inferior wire is discontiguous.  IMPRESSION: Normal bowel gas pattern.  Moderate stool volume.   Original Report Authenticated By: Christiana Pellant, M.D.     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  cva pathway.  ua pending.  Back to baseline status.  DNR per family and patient wishes.  obs on tele.  Refugia Laneve A 05/04/2012, 7:36 PM

## 2012-05-04 NOTE — ED Provider Notes (Signed)
History     CSN: [id number]  Arrival date & time 05/04/12  1538   First MD Initiated Contact with Patient 05/04/12 1533      Chief Complaint  Patient presents with  . Weakness  level 5 caveat due to aphasia.   (Consider location/radiation/quality/duration/timing/severity/associated sxs/prior treatment) Patient is a 72 y.o. female presenting with weakness.  Weakness   patient presented with new weakness. Report from EMS was acute onset just prior to their arrival of right-sided weakness. Patient is aphasic and has right-sided weakness from previous stroke. Patient reportedly had some headache and nausea and vomiting.   Past Medical History  Diagnosis Date  . 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 Communicates by writing   . Hypertension   . Hypercholesteremia   . Complication of anesthesia     lung   . Angina   . GERD (gastroesophageal reflux disease)   . Myocardial infarction     CABG 2001, ? stent. Cath 02/2011 with new distal LIMA-LAD 80%. SVG-OM occlusion is old, rest of grafts patent  . Peripheral vascular disease   . Diabetes mellitus   . H/O hiatal hernia     Past Surgical History  Procedure Laterality Date  . Coronary artery bypass graft    . Abdominal hysterectomy    . Breast biopsy    . Esophagogastroduodenoscopy  03/12/2011    Procedure: ESOPHAGOGASTRODUODENOSCOPY (EGD);  Surgeon: Theda Belfast, MD;  Location: [facility] ENDOSCOPY;  Service: Endoscopy;  Laterality: N/A;  . Cardiac catheterization  02/2011    Family History  Problem Relation Age of Onset  . Anesthesia problems Neg Hx   . Hypotension Neg Hx   . Malignant hyperthermia Neg Hx   . Pseudochol deficiency Neg Hx     History  Substance Use Topics  . Smoking status: Never Smoker   . Smokeless tobacco: Not on file  . Alcohol Use: No    OB History   Grav Para Term Preterm Abortions TAB SAB Ect Mult Living                  Review of Systems  Unable to perform  ROS Neurological: Positive for weakness.    Allergies  Penicillins  Home Medications   Current Outpatient Rx  Name  Route  Sig  Dispense  Refill  . clopidogrel (PLAVIX) 75 MG tablet   Oral   Take 75 mg by mouth daily.           Marland Kitchen escitalopram (LEXAPRO) 20 MG tablet   Oral   Take 20 mg by mouth daily.         . hydrochlorothiazide (HYDRODIURIL) 25 MG tablet   Oral   Take 25 mg by mouth daily.           . insulin aspart protamine-insulin aspart (NOVOLOG 70/30) (70-30) 100 UNIT/ML injection   Subcutaneous   Inject 25-34 Units into the skin 2 (two) times daily. 34 units in AM and 25 units in PM         . EXPIRED: nitroGLYCERIN (NITROSTAT) 0.4 MG SL tablet   Sublingual   Place 0.4 mg under the tongue every 5 (five) minutes as needed. For chest pain         . rosuvastatin (CRESTOR) 40 MG tablet   Oral   Take 40 mg by mouth at bedtime.             BP 131/65  Pulse 70  Temp(Src) 98.1 F (  36.7 C) (Oral)  Resp 19  SpO2 100%  Physical Exam  Constitutional: She appears well-developed.  HENT:  Head: Normocephalic.  Patient with right-sided facial droop.  Eyes: Pupils are equal, round, and reactive to light.  Neck: Neck supple.  Cardiovascular: Normal rate.   Pulmonary/Chest: She is in respiratory distress.  Abdominal: Soft.  Musculoskeletal:  Brace on right lower leg.  Neurological: She is alert.  Patient has chronic right-sided weakness and right facial droop from previous stroke. She also has aphasia. She communicates by writing. He'll follow commands and will raise right hand somewhat. She states this is less strength than she normally has. Complete NIH score done by neurology    ED Course  Procedures (including critical care time)  Labs Reviewed  COMPREHENSIVE METABOLIC PANEL - Abnormal; Notable for the following:    Glucose, Bld 234 (*)    GFR calc non Af Amer 68 (*)    GFR calc Af Amer 79 (*)    All other components within normal limits  POCT  I-STAT, CHEM 8 - Abnormal; Notable for the following:    Glucose, Bld 237 (*)    All other components within normal limits  PROTIME-INR  APTT  CBC  DIFFERENTIAL  TROPONIN I  LIPASE, BLOOD  URINALYSIS, ROUTINE W REFLEX MICROSCOPIC  POCT I-STAT TROPONIN I   Ct Head Wo Contrast  05/04/2012  *RADIOLOGY REPORT*  Clinical Data: Code stroke, right-sided weakness, occipital headache  CT HEAD WITHOUT CONTRAST  Technique:  Contiguous axial images were obtained from the base of the skull through the vertex without contrast.  Comparison: 07/26/2011  Findings: Stable brain atrophy and microvascular ischemic change in the periventricular white matter.  Remote appearing basal ganglia and bilateral corona radiata infarcts evident.  No acute intracranial hemorrhage, definite acute infarction, focal edema, mass effect, midline shift, hydrocephalus, herniation, or extra- axial fluid collection.  Extensive dural calcifications noted. Cisterns patent.  No cerebellar abnormality.  Orbits are symmetric. Mastoids are clear.  Minor mucosal thickening in the right maxillary sinus.  Other sinuses clear.  IMPRESSION: Stable atrophy and chronic ischemic changes.  No significant interval change or acute process by noncontrast CT.  Findings called to Dr. Roseanne Reno 05/04/2012 at 4:00 p.m.   Original Report Authenticated By: Judie Petit. Miles Costain, M.D.    Dg Chest Port 1 View  05/04/2012  *RADIOLOGY REPORT*  Clinical Data: Stroke  PORTABLE CHEST - 1 VIEW  Comparison: Chest radiograph 07/25/2011  Findings: Normal mediastinum and cardiac silhouette.  Costophrenic angles are clear.  No effusion, infiltrate, or pneumothorax.  IMPRESSION: No acute cardiopulmonary process.   Original Report Authenticated By: Genevive Bi, M.D.    Dg Abd 2 Views  05/04/2012  *RADIOLOGY REPORT*  Clinical Data: Abdominal pain  ABDOMEN - 2 VIEW  Comparison: CT 08/15/2008  Findings: Moderate stool volume noted throughout nondilated colon. No dilated loop of small  bowel.  No air fluid level.  Median sternotomy wires partly visualized.  Most inferior wire is discontiguous.  IMPRESSION: Normal bowel gas pattern.  Moderate stool volume.   Original Report Authenticated By: Christiana Pellant, M.D.      1. CVA (cerebral infarction)   2. HTN (hypertension)   3. N&V (nausea and vomiting)      Date: 05/04/2012  Rate: 72  Rhythm: normal sinus rhythm  QRS Axis: normal  Intervals: normal  ST/T Wave abnormalities: nonspecific ST/T changes  Conduction Disutrbances:none  Narrative Interpretation:   Old EKG Reviewed: unchanged    MDM  Patient presented from assisted  living with worsening right-sided weakness. Initially was told that it was an onset 20 minutes ago, however it appears that started approximately 4 hours prior to arrival. She did have increasing weakness compared to her baseline weakness on the right side. She has a baseline aphasia but is able to write. Head CT is reassuring. Patient is also had some nausea vomiting abdominal pain. Laboratory reassuring and x-ray does not show obstruction. Patient was seen by neurology. She's not a TPA candidate due to the severity of disease and time of onset. She'll be admitted to internal medicine.        Juliet Rude. Rubin Payor, MD 05/04/12 2024

## 2012-05-04 NOTE — ED Notes (Signed)
Pt arrived at 15:31, [place] notified immediately, Code Stroke notified 15:36, EDP exam @ 15:36, Stroke team 15:44, Pt arrived to CT at 15:44, last seen normal 12:00, Code stroke cancelled 16:13 by Roseanne Reno, MD

## 2012-05-04 NOTE — ED Notes (Signed)
Pt given water via straw to see if she can do this but this caused increased water drooling from mouth and also some coughing.  Explained to pt and family that pt will get ST eval and be NPO for safety due to risk of aspiration

## 2012-05-05 ENCOUNTER — Encounter (HOSPITAL_COMMUNITY): Payer: Self-pay | Admitting: *Deleted

## 2012-05-05 DIAGNOSIS — I69928 Other speech and language deficits following unspecified cerebrovascular disease: Secondary | ICD-10-CM

## 2012-05-05 DIAGNOSIS — I2510 Atherosclerotic heart disease of native coronary artery without angina pectoris: Secondary | ICD-10-CM

## 2012-05-05 DIAGNOSIS — G459 Transient cerebral ischemic attack, unspecified: Principal | ICD-10-CM

## 2012-05-05 DIAGNOSIS — I69959 Hemiplegia and hemiparesis following unspecified cerebrovascular disease affecting unspecified side: Secondary | ICD-10-CM

## 2012-05-05 DIAGNOSIS — I517 Cardiomegaly: Secondary | ICD-10-CM

## 2012-05-05 LAB — GLUCOSE, CAPILLARY
Glucose-Capillary: 158 mg/dL — ABNORMAL HIGH (ref 70–99)
Glucose-Capillary: 131 mg/dL — ABNORMAL HIGH (ref 70–99)

## 2012-05-05 LAB — LIPID PANEL: LDL Cholesterol: 131 mg/dL — ABNORMAL HIGH (ref 0–99)

## 2012-05-05 MED ORDER — INSULIN ASPART PROT & ASPART (70-30 MIX) 100 UNIT/ML ~~LOC~~ SUSP
34.0000 [IU] | Freq: Every day | SUBCUTANEOUS | Status: DC
  Administered 2012-05-05: 34 [IU] via SUBCUTANEOUS
  Filled 2012-05-05: qty 10

## 2012-05-05 MED ORDER — ESCITALOPRAM OXALATE 20 MG PO TABS
20.0000 mg | ORAL_TABLET | Freq: Every day | ORAL | Status: DC
Start: 2012-05-05 — End: 2012-05-05
  Administered 2012-05-05: 20 mg via ORAL
  Filled 2012-05-05: qty 1

## 2012-05-05 MED ORDER — ATORVASTATIN CALCIUM 10 MG PO TABS
10.0000 mg | ORAL_TABLET | Freq: Every day | ORAL | Status: DC
  Filled 2012-05-05: qty 1

## 2012-05-05 MED ORDER — SODIUM CHLORIDE 0.9 % IJ SOLN: 3.0000 mL | INTRAMUSCULAR | Status: DC | PRN

## 2012-05-05 MED ORDER — DEXTROSE 50 % IV SOLN
25.0000 mL | Freq: Once | INTRAVENOUS | Status: AC | PRN
  Administered 2012-05-05: 25 mL via INTRAVENOUS

## 2012-05-05 MED ORDER — INSULIN ASPART PROT & ASPART (70-30 MIX) 100 UNIT/ML ~~LOC~~ SUSP: 25.0000 [IU] | Freq: Every day | SUBCUTANEOUS | Status: DC

## 2012-05-05 MED ORDER — SODIUM CHLORIDE 0.9 % IJ SOLN
3.0000 mL | Freq: Two times a day (BID) | INTRAMUSCULAR | Status: DC
  Administered 2012-05-05 (×2): 3 mL via INTRAVENOUS

## 2012-05-05 MED ORDER — ONDANSETRON HCL 4 MG/2ML IJ SOLN: 4.0000 mg | Freq: Four times a day (QID) | INTRAMUSCULAR | Status: DC | PRN

## 2012-05-05 MED ORDER — CLOPIDOGREL BISULFATE 75 MG PO TABS
75.0000 mg | ORAL_TABLET | Freq: Every day | ORAL | Status: DC
  Administered 2012-05-05: 75 mg via ORAL
  Filled 2012-05-05: qty 1

## 2012-05-05 MED ORDER — HYDROCHLOROTHIAZIDE 25 MG PO TABS
25.0000 mg | ORAL_TABLET | Freq: Every day | ORAL | Status: DC
  Administered 2012-05-05: 25 mg via ORAL
  Filled 2012-05-05: qty 1

## 2012-05-05 MED ORDER — DEXTROSE 50 % IV SOLN
INTRAVENOUS | Status: AC
  Administered 2012-05-05: 25 mL
  Filled 2012-05-05: qty 50

## 2012-05-05 MED ORDER — SODIUM CHLORIDE 0.9 % IV SOLN: 250.0000 mL | INTRAVENOUS | Status: DC | PRN

## 2012-05-05 NOTE — Discharge Summary (Signed)
Physician Discharge Summary  Patient ID: [name] [name] MRN: [id number] DOB/AGE: [birth year] 72 y.o.  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     escitalopram 20 MG tablet  Commonly known as:  LEXAPRO  Take 20 mg by mouth daily.     hydrochlorothiazide 25 MG tablet  Commonly known as:  HYDRODIURIL  Take 25 mg by mouth daily.     insulin aspart protamine-insulin aspart (70-30) 100 UNIT/ML injection  Commonly known as:  NOVOLOG 70/30  Inject 11-38 Units into the skin 2 (two) times daily with a meal. Uses 38 units in the morning and 11 units at night     nitroGLYCERIN 0.4 MG SL tablet  Commonly known as:  NITROSTAT  Place 0.4 mg under the tongue every 5 (five) minutes as needed for chest pain.     rosuvastatin 40 MG tablet  Commonly known as:  CRESTOR  Take 40 mg by mouth daily.     VITAMIN D PO  Take 1 tablet by mouth daily.         Disposition and Follow-up:  Will be discharged home today in stable and improved condition. 2D ECHO is pending at DC and should be reviewed at follow up appointment, which I have recommended with her PCP in 2 weeks.  Consults:  Neurology, Dr. Roseanne Reno.   Significant Diagnostic Studies:  Ct Head Wo Contrast  05/04/2012  *RADIOLOGY REPORT*  Clinical Data: Code stroke, right-sided weakness, occipital headache  CT HEAD WITHOUT  CONTRAST  Technique:  Contiguous axial images were obtained from the base of the skull through the vertex without contrast.  Comparison: 07/26/2011  Findings: Stable brain atrophy and microvascular ischemic change in the periventricular white matter.  Remote appearing basal ganglia and bilateral corona radiata infarcts evident.  No acute intracranial hemorrhage, definite acute infarction, focal edema, mass effect, midline shift, hydrocephalus, herniation, or extra- axial fluid collection.  Extensive dural calcifications noted. Cisterns patent.  No cerebellar abnormality.  Orbits are symmetric. Mastoids are clear.  Minor mucosal thickening in the right maxillary sinus.  Other sinuses clear.  IMPRESSION: Stable atrophy and chronic ischemic changes.  No significant interval change or acute process by noncontrast CT.  Findings called to Dr. Roseanne Reno 05/04/2012 at 4:00 p.m.   Original Report Authenticated By: Judie Petit. Miles Costain, M.D.    Dg Chest Port 1 View  05/04/2012  *RADIOLOGY REPORT*  Clinical Data: Stroke  PORTABLE CHEST - 1 VIEW  Comparison: Chest radiograph 07/25/2011  Findings: Normal mediastinum and cardiac silhouette.  Costophrenic angles are clear.  No effusion, infiltrate, or pneumothorax.  IMPRESSION: No acute cardiopulmonary process.   Original Report Authenticated By: Genevive Bi, M.D.    Dg Abd 2 Views  05/04/2012  *RADIOLOGY REPORT*  Clinical Data: Abdominal pain  ABDOMEN -  2 VIEW  Comparison: CT 08/15/2008  Findings: Moderate stool volume noted throughout nondilated colon. No dilated loop of small bowel.  No air fluid level.  Median sternotomy wires partly visualized.  Most inferior wire is discontiguous.  IMPRESSION: Normal bowel gas pattern.  Moderate stool volume.   Original Report Authenticated By: Christiana Pellant, M.D.     Brief H and P: For complete details please refer to admission H and P, but in brief  Patient is a 72 yo female h/o cva with residual rt sided hemiparesis/aphasia comes in with  episode of generalized weakness that has resolved. She lives at home alone and has a nurse aid daily, she is able to get herself in and out of her wheelchair daily and actually is still fairly independent. Pt is back to her baseline status. We were asked to admit her for further evaluation and management.      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 -ECHO results pending at time of DC. -Doppler with <40% stenosis bilaterally. -Continue statin, ASA, plavix. -She is back to her baseline function. -She has HH already set up, and this will be continued upon DC.  Time spent on Discharge: Greater than 30 minutes.  SignedChaya Jan [facility] Pager: [phone] 05/05/2012, 1:56 PM

## 2012-05-05 NOTE — Progress Notes (Signed)
Stroke Team Progress Note  HISTORY [name] [name] is an 72 y.o. female who complains of vomiting around 1100 on 05/04/2012 and then became diffusely weak around 1200. She thinks that her RLE may be weaker than normal, also complains of posterior headache. 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 When patient arrived, seen by ER MD and called [facility]. Patients symptoms appear to be at baseline: therefore no tPA. Only other complaint is that patient has had chills over the past few days. No cough. Patient can communicate with writing on paper. Patient was not a TPA candidate secondary to beyond the time window. She was admitted for further evaluation and treatment.  SUBJECTIVE Her RN is at the bedside.  Overall she feels her condition is stable. She is concerned with decreased tongue movements and oral food manipulation.  OBJECTIVE Most recent Vital Signs: Filed Vitals:   05/04/12 2300 05/05/12 0003 05/05/12 0215 05/05/12 0447  BP: 146/57 159/59 117/49 109/43  Pulse: 59 64 63 60  Temp:  98 F (36.7 C) 97.9 F (36.6 C) 98.4 F (36.9 C)  TempSrc:  Oral Oral Oral  Resp: 12 14 15 15   Height:  5\' 5"  (1.651 m)    Weight:  81.965 kg (180 lb 11.2 oz)    SpO2: 100% 100% 100% 96%   CBG (last 3)   Recent Labs  05/04/12 2346 05/05/12 0649  GLUCAP 93 158*    IV Fluid Intake:     MEDICATIONS  . atorvastatin  10 mg Oral q1800  . clopidogrel  75 mg Oral Daily  . escitalopram  20 mg Oral Daily  . hydrochlorothiazide  25 mg Oral Daily  . insulin aspart protamine-insulin aspart  25 Units Subcutaneous QAC supper  . insulin aspart protamine-insulin aspart  34 Units Subcutaneous QAC breakfast  . sodium chloride  3 mL Intravenous Q12H   PRN:  sodium chloride, ondansetron (ZOFRAN) IV, sodium chloride  Diet:  NPO  Activity:  As  tolerated DVT Prophylaxis:  SCDs   CLINICALLY SIGNIFICANT STUDIES Basic Metabolic Panel:  Recent Labs Lab 05/04/12 1556 05/04/12 1618  NA 139 143  K 4.0 4.0  CL 104 107  CO2 25  --   GLUCOSE 234* 237*  BUN 15 16  CREATININE 0.84 0.80  CALCIUM 9.6  --    Liver Function Tests:  Recent Labs Lab 05/04/12 1556  AST 19  ALT 14  ALKPHOS 57  BILITOT 0.3  PROT 7.3  ALBUMIN 3.5   CBC:  Recent Labs Lab 05/04/12 1556 05/04/12 1618  WBC 6.3  --   NEUTROABS 4.4  --   HGB 12.0 12.9  HCT 37.2 38.0  MCV 81.9  --   PLT 177  --    Coagulation:  Recent Labs Lab 05/04/12 1556  LABPROT 13.2  INR 1.01   Cardiac Enzymes:  Recent Labs Lab 05/04/12 1556  TROPONINI <0.30   Urinalysis:  Recent Labs Lab 05/04/12 2056  COLORURINE YELLOW  LABSPEC 1.021  PHURINE 5.5  GLUCOSEU NEGATIVE  HGBUR NEGATIVE  BILIRUBINUR NEGATIVE  KETONESUR NEGATIVE  PROTEINUR NEGATIVE  UROBILINOGEN 1.0  NITRITE NEGATIVE  LEUKOCYTESUR NEGATIVE   Lipid Panel    Component Value Date/Time   CHOL 197 05/05/2012 0740   TRIG 89 05/05/2012 0740   HDL 48 05/05/2012 0740   CHOLHDL 4.1 05/05/2012 0740   VLDL 18  05/05/2012 0740   LDLCALC 131* 05/05/2012 0740   HgbA1C  Lab Results  Component Value Date   HGBA1C 7.3* 07/25/2011    Urine Drug Screen:   No results found for this basename: labopia, cocainscrnur, labbenz, amphetmu, thcu, labbarb    Alcohol Level: No results found for this basename: ETH,  in the last 168 hours  Dg Abd 2 Views 05/04/2012  Normal bowel gas pattern.  Moderate stool volume.     CT of the brain  05/04/2012   Stable atrophy and chronic ischemic changes.  No significant interval change or acute process by noncontrast CT.   MRI of the brain    MRA of the brain    2D Echocardiogram  Left ventricle: Wall thickness was increased in a pattern of mild LVH. Systolic function was mildly reduced. The estimated ejection fraction was in the range of 45% to 50%. Akinesis of the  basal-midinferior myocardium. There was an increased relative contribution of atrial contraction to ventricular filling.  Carotid Doppler  Right = 40-59% internal carotid artery stenosis. Left = <40% internal carotid artery stenosis.  Antegrade vertebral flow.  CXR  05/04/2012  : No acute cardiopulmonary process.     EKG  normal sinus rhythm.   Therapy Recommendations   Physical Exam    pleasant african american lady not in distress.Awake alert. Afebrile. Head is nontraumatic. Neck is supple without bruit. Hearing is normal. Cardiac exam no murmur or gallop. Lungs are clear to auscultation. Distal pulses are well felt. Neurological Exam :  Awake alert severe dysarthria and can barely speak a few words and difficult to understand. Able to comprehend, name and repeat fairly well. Pupils are equal reactive. Extraocular moments are full range without nystagmus. Fundi were not visualized. Vision acuity and fields appear normal. There is bifacial weakness of right greater than left hand lower half more than upper half. Tongue is midline. There is brisk jaw jerk reflex. Palatal moments are sluggish. No upper or lower extended drift. Weakness of the right grip. Diminished fine finger movements on the right. Orbits left over right approximately. Mild weakness of right hip flexors and ankle dorsiflexors. Tone is increased on the right. Deep tendon reflexes are asymmetric risk of the right compared to the left. Right plantar is upgoing. Left is equivocal. Sensation seems normal. Gait was not tested. ASSESSMENT Ms. [name] [name] is a 72 y.o. female presenting with worsening right hemiparesis, vomiting and posterior headache. Suspect pesudibulbar state from old bilateral infarcts secondary to small vessel disease.  On clopidogrel 75 mg orally every day prior to admission. Now on clopidogrel 75 mg orally every day for secondary stroke prevention. Patient with resultant oral apraxia, increased right hemiparesis,  dysarthria, no aphasia. Work up underway.  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  Check swallow  Therapy evals  F/u MRI  Annie Main, MSN, RN, ANVP-BC, ANP-BC, GNP-BC [facility] Pager: [phone] 05/05/2012 10:54 AM  I have personally obtained a history, examined the patient, evaluated imaging results, and formulated the assessment and plan of care. I agree with the above.  Delia Heady, MD

## 2012-05-05 NOTE — Evaluation (Signed)
Clinical/Bedside Swallow Evaluation Patient Details  Name: [name] [name] MRN: [id number] Date of Birth: [birth year]  Today's Date: 05/05/2012 Time: 1100-1210 SLP Time Calculation (min): 70 min  Past Medical History:  Past Medical History  Diagnosis Date  . 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 Communicates by writing   . Hypertension   . Hypercholesteremia   . Complication of anesthesia     lung   . Angina   . GERD (gastroesophageal reflux disease)   . Myocardial infarction     CABG 2001, ? stent. Cath 02/2011 with new distal LIMA-LAD 80%. SVG-OM occlusion is old, rest of grafts patent  . Peripheral vascular disease   . Diabetes mellitus   . H/O hiatal hernia    Past Surgical History:  Past Surgical History  Procedure Laterality Date  . Coronary artery bypass graft    . Abdominal hysterectomy    . Breast biopsy    . Esophagogastroduodenoscopy  03/12/2011    Procedure: ESOPHAGOGASTRODUODENOSCOPY (EGD);  Surgeon: Theda Belfast, MD;  Location: [facility] ENDOSCOPY;  Service: Endoscopy;  Laterality: N/A;  . Cardiac catheterization  02/2011   HPI:  72 yo female adm [facility] with vomiting and weakness, ? RLE increased weakness.  PMH + for previous CVAs (corona radiata, cerebellum) with resultant right hemiparesis and severe dysathria.  Pt CT Head negative and she had an MRI today but results are not available.  Pt uses writing to communicate.  She "failed" an RN stroke swallow screen.       Assessment / Plan / Recommendation Clinical Impression  Pt presents with continued severe oral sensoimotor based dysphagia that she admits is worse now than her baseline - 10 being baseline, pt reports swallow is at level 8.  Increased drooling with intake reported by pt currently, premorbid "choking" on saliva reported.  Pt has been consuming a regular/thin diet at home with good tolerance.- CXR was clear, CT Head clear.   As pt has previously desired po diet with known risks and  she states her swallowing of thin liquid is baseline, rec to start puree/thin with strict precautions.  Oral suction set up and demonstrated use to pt, used teach back to assure understanding of information.  SLP can not rule out silent aspiration at bedside.  If pt demonstates any problems swallowing, would advise to make her npo and pursue MBS.  Pt agreeable to plan.     Aspiration Risk  Moderate    Diet Recommendation Dysphagia 1 (Puree);Thin liquid   Liquid Administration via: Cup Medication Administration: Crushed with puree (or as pt indicates) Supervision: Patient able to self feed Compensations: Slow rate;Small sips/bites;Check for pocketing Postural Changes and/or Swallow Maneuvers: Seated upright 90 degrees;Upright 30-60 min after meal    Other  Recommendations Oral Care Recommendations: Oral care QID   Follow Up Recommendations       Frequency and Duration min 2x/week  2 weeks   Pertinent Vitals/Pain Afebrile, decreased    SLP Swallow Goals Patient will utilize recommended strategies during swallow to increase swallowing safety with: Minimal cueing   Swallow Study Prior Functional Status  Type of Home: House Lives With: Alone Available Help at Discharge: Personal care attendant (2 hours a day M-F, PT 2x/wk) Vocation: Retired    General HPI: 72 yo female adm to [facility] with vomiting and weakness, ? RLE increased weakness.  PMH + for previous CVAs (corona radiata, cerebellum) with resultant right hemiparesis and severe dysathria.  Pt CT Head negative  and she had an MRI today but results are not available.  Pt uses writing to communicate.  She "failed" an RN stroke swallow screen.     Type of Study: Modified Barium Swallowing Study Previous Swallow Assessment: Previous MBS, BSE from prior strokes May/June 2013- rec puree/nectar, pt reports swallow has improved since that time and she was eating regular foods ( no particulate foods) and drinking thin liquids without  deficits Diet Prior to this Study: NPO Temperature Spikes Noted: No Respiratory Status: Room air History of Recent Intubation: No Behavior/Cognition: Alert;Cooperative Oral Cavity - Dentition: Adequate natural dentition Self-Feeding Abilities: Able to feed self Patient Positioning: Upright in chair Baseline Vocal Quality: Hoarse;Low vocal intensity (pt reports voice is baseline) Volitional Cough: Weak Volitional Swallow: Able to elicit (with signficant effort)    Oral/Motor/Sensory Function Overall Oral Motor/Sensory Function: Impaired at baseline Labial ROM: Reduced right Labial Symmetry: Abnormal symmetry right Labial Strength: Reduced Labial Sensation: Reduced Lingual ROM: Reduced right Lingual Symmetry: Abnormal symmetry right Lingual Strength: Reduced Lingual Sensation: Reduced Facial ROM: Reduced right Facial Symmetry: Right droop (right eyebrow does not move with attempt) Facial Strength: Reduced Facial Sensation: Reduced Velum:  (poor velum movement) Mandible: Impaired   Ice Chips Ice chips: Not tested   Thin Liquid Thin Liquid: Impaired Presentation: Cup;Self Fed Oral Phase Impairments: Reduced labial seal;Reduced lingual movement/coordination;Impaired anterior to posterior transit Oral Phase Functional Implications: Right anterior spillage;Prolonged oral transit;Left anterior spillage (lingual protrusion with transition attempts) Pharyngeal  Phase Impairments: Suspected delayed Swallow;Multiple swallows    Nectar Thick Nectar Thick Liquid: Impaired Presentation: Cup;Self Fed Oral Phase Impairments: Reduced lingual movement/coordination;Impaired anterior to posterior transit Oral phase functional implications: Prolonged oral transit;Right anterior spillage;Left anterior spillage Pharyngeal Phase Impairments: Suspected delayed Swallow;Multiple swallows;Decreased hyoid-laryngeal movement;Cough - Immediate Other Comments: cough x1 noted when pt laughed, ? aspiration of  secretions   Honey Thick Honey Thick Liquid: Not tested   Puree Puree: Impaired Presentation: Self Fed;Spoon Oral Phase Impairments: Reduced labial seal;Reduced lingual movement/coordination;Impaired anterior to posterior transit Oral Phase Functional Implications: Right anterior spillage;Prolonged oral transit Pharyngeal Phase Impairments: Suspected delayed Swallow;Multiple swallows   Solid   GO Functional Assessment Tool Used: clinical judgement Functional Limitations: Swallowing Swallow Current Status (U9811): At least 80 percent but less than 100 percent impaired, limited or restricted Swallow Goal Status [phone]): At least 60 percent but less than 80 percent impaired, limited or restricted  Solid: Impaired Oral Phase Impairments: Impaired anterior to posterior transit;Reduced lingual movement/coordination;Reduced labial seal Oral Phase Functional Implications: Right lateral sulci pocketing;Right anterior spillage;Left anterior spillage Pharyngeal Phase Impairments: Suspected delayed Fredric Mare, MS [facility] SLP [phone]

## 2012-05-05 NOTE — Progress Notes (Signed)
  Echocardiogram 2D Echocardiogram has been performed.  Jorje Guild 05/05/2012, 9:50 AM

## 2012-05-05 NOTE — Evaluation (Signed)
Physical Therapy Evaluation Patient Details Name: [name] [name] MRN: [id number] DOB: 1940-09-01 Today's Date: 05/05/2012 Time: 7829-5621 PT Time Calculation (min): 24 min  PT Assessment / Plan / Recommendation Clinical Impression  Pt is a 72 yo female admitted for generalized weakness that she feels has resolved. Pt was functioning mod I from w/c level PTA and living alone. Pt now requires assist for safe transfers. If patient can not reach safe mod I function while in hospital pt will need SNF placement prior to return home alone. Although pt will most likely not agree to SNF.    PT Assessment  Patient needs continued PT services    Follow Up Recommendations  SNF;Supervision/Assistance - 24 hour (cont' HHPT if pt reaches mod I function prior to d/c)    Does the patient have the potential to tolerate intense rehabilitation      Barriers to Discharge Decreased caregiver support (pt lives alone)      Equipment Recommendations  None recommended by PT    Recommendations for Other Services     Frequency Min 4X/week    Precautions / Restrictions Precautions Precautions: Fall Precaution Comments: pt has R shoe with metal AFO Restrictions Weight Bearing Restrictions: No   Pertinent Vitals/Pain Pt denies pain      Mobility  Bed Mobility Bed Mobility: Supine to Sit;Sitting - Scoot to Edge of Bed Supine to Sit: 3: Mod assist;With rails;HOB elevated Sitting - Scoot to Edge of Bed: 3: Mod assist;With rail Details for Bed Mobility Assistance: assist for trunk elevation, pt with strong use of bed rail with L UE. pt does have hospital bed at home Transfers Transfers: Stand Pivot Transfers Stand Pivot Transfers: 4: Min assist;With armrests Details for Transfer Assistance: pt required bed to be raised and pulled self with L UE on far arm rest of chair. minA provided to steady pt due to impaired balance. pt took 3 steps. no R knee flexion Ambulation/Gait Ambulation/Gait Assistance: Not  tested (comment) (pt non-amb at home, only amb with PT) Modified Rankin (Stroke Patients Only) Pre-Morbid Rankin Score: Moderately severe disability Modified Rankin: Moderately severe disability    Exercises     PT Diagnosis: Difficulty walking;Generalized weakness  PT Problem List: Decreased strength;Decreased activity tolerance;Decreased balance;Decreased mobility PT Treatment Interventions:     PT Goals Acute Rehab PT Goals PT Goal Formulation: With patient Time For Goal Achievement: 05/19/12 Potential to Achieve Goals: Good Pt will go Supine/Side to Sit: with modified independence;with rail PT Goal: Supine/Side to Sit - Progress: Goal set today Pt will go Sit to Supine/Side: with modified independence;with rail PT Goal: Sit to Supine/Side - Progress: Goal set today Pt will Transfer Bed to Chair/Chair to Bed: with modified independence PT Transfer Goal: Bed to Chair/Chair to Bed - Progress: Goal set today Pt will Propel Wheelchair: 10 - 50 feet;with modified independence PT Goal: Propel Wheelchair - Progress: Goal set today Additional Goals Additional Goal #1: Pt to maintain dynamic sitting balance x 10 min with no UE support for meal prep PT Goal: Additional Goal #1 - Progress: Goal set today  Visit Information  Last PT Received On: 05/05/12 Assistance Needed: +1    Subjective Data  Subjective: Pt received supine in bed agreeable to transfer to chair. Patient Stated Goal: home   Prior Functioning  Home Living Lives With: Alone Available Help at Discharge: Personal care attendant (2 hours a day M-F, PT 2x/wk) Type of Home: House Home Access: Level entry Home Layout: One level Bathroom Shower/Tub: Tub/shower unit [employer]  Toilet: Handicapped height Bathroom Accessibility: Yes How Accessible: Accessible via wheelchair Home Adaptive Equipment: Bedside commode/3-in-1;Hand-held shower hose;Hospital bed;Tub transfer bench;Wheelchair - manual Additional Comments: pt  functioning out of w/c with minimal assist Prior Function Level of Independence: Independent with assistive device(s) Able to Take Stairs?: No Driving: No Vocation: Retired Comments: aide helps with bathing and cleaning, pt prepares meals. family takes pt to grocery store to do shopping Communication Communication: Expressive difficulties (but able to write with L UE) Dominant Hand: Left    Cognition  Cognition Overall Cognitive Status: Appears within functional limits for tasks assessed/performed Arousal/Alertness: Awake/alert Orientation Level: Oriented X4 / Intact Behavior During Session: [facility] for tasks performed Cognition - Other Comments: pt particular in ways of doing things due to "I've been doing things my way for a long time."    Extremity/Trunk Assessment Right Upper Extremity Assessment RUE ROM/Strength/Tone: Deficits RUE ROM/Strength/Tone Deficits: no functional use, residual from previous stroke RUE Sensation: WFL - Light Touch Left Upper Extremity Assessment LUE ROM/Strength/Tone: WFL for tasks assessed LUE Sensation: WFL - Light Touch Right Lower Extremity Assessment RLE ROM/Strength/Tone: Deficits RLE ROM/Strength/Tone Deficits: pt with hip flexor of 2/5  RLE Sensation: Deficits RLE Sensation Deficits: numbness in foot- pt reports this to be normal Left Lower Extremity Assessment LLE ROM/Strength/Tone: WFL for tasks assessed LLE Sensation: Deficits LLE Sensation Deficits: numbness in foot - pt reports to be normal Trunk Assessment Trunk Assessment: Normal   Balance Balance Balance Assessed: Yes Static Sitting Balance Static Sitting - Balance Support: Left upper extremity supported;Feet supported Static Sitting - Level of Assistance: 5: Stand by assistance Static Sitting - Comment/# of Minutes: 3  End of Session PT - End of Session Equipment Utilized During Treatment: Gait belt (pt's shoes and AFO) Activity Tolerance: Patient tolerated treatment  well Patient left: in chair;with call bell/phone within reach (SLP present) Nurse Communication: Mobility status  GP Functional Assessment Tool Used: clinical judgement Functional Limitation: Mobility: Walking and moving around Mobility: Walking and Moving Around Current Status ([id number]): At least 60 percent but less than 80 percent impaired, limited or restricted Mobility: Walking and Moving Around Goal Status [phone]): At least 60 percent but less than 80 percent impaired, limited or restricted   Marcene Brawn 05/05/2012, 11:54 AM

## 2012-05-05 NOTE — Progress Notes (Signed)
Inpatient Diabetes Program Recommendations  AACE/ADA: New Consensus Statement on Inpatient Glycemic Control (2013)  Target Ranges:  Prepandial:   less than 140 mg/dL      Peak postprandial:   less than 180 mg/dL (1-2 hours)      Critically ill patients:  140 - 180 mg/dL   Reason for Visit:Results for [name] [name], [name] [name] (MRN [id number]) as of 05/05/2012 12:20  Ref. Range 05/05/2012 06:49 05/05/2012 11:28 05/05/2012 12:20  Glucose-Capillary Latest Range: 70-99 mg/dL 086 (H) 56 (L) 578 (H)   Note hypoglycemic event at lunch.  Patient is NPO.  Please discontinue 70/30 insulin and consider adding Lantus 15 units daily.  Also please add Novolog sensitive q 4 hours.

## 2012-05-05 NOTE — Care Management Note (Signed)
    Page 1 of 1   05/05/2012     4:24:28 PM   CARE MANAGEMENT NOTE 05/05/2012  Patient:  [name] [name]   Account Number:  [id number]  Date Initiated:  05/05/2012  Documentation initiated by:  Jacquelynn Cree  Subjective/Objective Assessment:   admitted with weakness, lives alone in senior apartment with daily adie visits, has  HHPT and ST with [facility]     Action/Plan:   PT/OT evals-recommend HHPT   Anticipated DC Date:  05/06/2012   Anticipated DC Plan:  HOME W HOME HEALTH SERVICES      DC Planning Services  CM consult      Choice offered to / List presented to:  C-1 Patient        HH arranged  HH-2 PT  HH-5 SPEECH THERAPY      HH agency  [facility]   Status of service:  Completed, signed off Medicare Important Message given?   (If response is "NO", the following Medicare IM given date fields will be blank) Date Medicare IM given:   Date Additional Medicare IM given:    Discharge Disposition:  HOME W HOME HEALTH SERVICES  Per UR Regulation:  Reviewed for med. necessity/level of care/duration of stay  If discussed at Long Length of Stay Meetings, dates discussed:    Comments:  05/06/11 Spoke with patient and her daughter about HHC. Patient is currently active with [facility] for PT and ST and they would like to continue with [facility]. Contacted Marie at [facility] and set up to continue HHPT and HHOT. Patient's daughter states that she will be checking on patient frequently in addition to her [facility]  and neighbors.No  equipment need identified. Jacquelynn Cree RN, BSN, CCM

## 2012-05-05 NOTE — Progress Notes (Signed)
D/C instructions has been reviewed with pt and daughter, pt has signed them, one copy is in the chart and pt has the second copy. Iv and tele has been d/c, Daughter says she will transport pt home. ---Peace Dormon, rn

## 2012-05-05 NOTE — Progress Notes (Signed)
*  PRELIMINARY RESULTS* Vascular Ultrasound Carotid Duplex (Doppler) has been completed.  Preliminary findings: Right = 40-59% internal carotid artery stenosis.  Left = <40% internal carotid artery stenosis. Antegrade vertebral flow.   Farrel Demark, RDMS, RVT  05/05/2012, 10:06 AM

## 2012-05-05 NOTE — Progress Notes (Signed)
SLP Cancellation Note  Patient Details Name: Melannie Metzner MRN: [id number] DOB: 08/13/1940   Cancelled treatment:       Reason Eval/Treat Not Completed: Patient at procedure or test/unavailable   Donavan Burnet, MS [facility] SLP [phone]

## 2012-05-05 NOTE — Progress Notes (Signed)
Pt did had a hypoglycemic episode, cbg=56, pt was npo so she was given 25ml of D50 and cbg came to 131 when it was rechecked.----Peace Dormon, rn

## 2012-06-16 ENCOUNTER — Emergency Department (HOSPITAL_COMMUNITY): Payer: Medicare HMO

## 2012-06-16 ENCOUNTER — Inpatient Hospital Stay (HOSPITAL_COMMUNITY)
Admission: EM | Admit: 2012-06-16 | Discharge: 2012-06-23 | DRG: 178 | Disposition: A | Discharge status: Skilled Nursing Facility | Payer: Medicare HMO | Attending: Internal Medicine | Admitting: Internal Medicine

## 2012-06-16 ENCOUNTER — Encounter (HOSPITAL_COMMUNITY): Payer: Self-pay | Admitting: *Deleted

## 2012-06-16 DIAGNOSIS — I69920 Aphasia following unspecified cerebrovascular disease: Secondary | ICD-10-CM

## 2012-06-16 DIAGNOSIS — R1310 Dysphagia, unspecified: Secondary | ICD-10-CM | POA: Diagnosis present

## 2012-06-16 DIAGNOSIS — I10 Essential (primary) hypertension: Secondary | ICD-10-CM | POA: Diagnosis present

## 2012-06-16 DIAGNOSIS — Z91199 Patient's noncompliance with other medical treatment and regimen due to unspecified reason: Secondary | ICD-10-CM

## 2012-06-16 DIAGNOSIS — I69959 Hemiplegia and hemiparesis following unspecified cerebrovascular disease affecting unspecified side: Secondary | ICD-10-CM

## 2012-06-16 DIAGNOSIS — I69991 Dysphagia following unspecified cerebrovascular disease: Secondary | ICD-10-CM

## 2012-06-16 DIAGNOSIS — Z9861 Coronary angioplasty status: Secondary | ICD-10-CM

## 2012-06-16 DIAGNOSIS — G459 Transient cerebral ischemic attack, unspecified: Secondary | ICD-10-CM

## 2012-06-16 DIAGNOSIS — I639 Cerebral infarction, unspecified: Secondary | ICD-10-CM | POA: Diagnosis present

## 2012-06-16 DIAGNOSIS — I214 Non-ST elevation (NSTEMI) myocardial infarction: Secondary | ICD-10-CM

## 2012-06-16 DIAGNOSIS — E119 Type 2 diabetes mellitus without complications: Secondary | ICD-10-CM | POA: Diagnosis present

## 2012-06-16 DIAGNOSIS — E876 Hypokalemia: Secondary | ICD-10-CM | POA: Diagnosis not present

## 2012-06-16 DIAGNOSIS — I69328 Other speech and language deficits following cerebral infarction: Secondary | ICD-10-CM

## 2012-06-16 DIAGNOSIS — Z515 Encounter for palliative care: Secondary | ICD-10-CM

## 2012-06-16 DIAGNOSIS — J189 Pneumonia, unspecified organism: Secondary | ICD-10-CM | POA: Diagnosis present

## 2012-06-16 DIAGNOSIS — I2510 Atherosclerotic heart disease of native coronary artery without angina pectoris: Secondary | ICD-10-CM

## 2012-06-16 DIAGNOSIS — Z951 Presence of aortocoronary bypass graft: Secondary | ICD-10-CM

## 2012-06-16 DIAGNOSIS — I252 Old myocardial infarction: Secondary | ICD-10-CM

## 2012-06-16 DIAGNOSIS — R079 Chest pain, unspecified: Secondary | ICD-10-CM | POA: Diagnosis present

## 2012-06-16 DIAGNOSIS — R112 Nausea with vomiting, unspecified: Secondary | ICD-10-CM

## 2012-06-16 DIAGNOSIS — R531 Weakness: Secondary | ICD-10-CM

## 2012-06-16 DIAGNOSIS — Z79899 Other long term (current) drug therapy: Secondary | ICD-10-CM

## 2012-06-16 DIAGNOSIS — K219 Gastro-esophageal reflux disease without esophagitis: Secondary | ICD-10-CM | POA: Diagnosis present

## 2012-06-16 DIAGNOSIS — E785 Hyperlipidemia, unspecified: Secondary | ICD-10-CM | POA: Diagnosis present

## 2012-06-16 DIAGNOSIS — I739 Peripheral vascular disease, unspecified: Secondary | ICD-10-CM | POA: Diagnosis present

## 2012-06-16 DIAGNOSIS — E7800 Pure hypercholesterolemia, unspecified: Secondary | ICD-10-CM | POA: Diagnosis present

## 2012-06-16 DIAGNOSIS — J690 Pneumonitis due to inhalation of food and vomit: Principal | ICD-10-CM | POA: Diagnosis present

## 2012-06-16 DIAGNOSIS — Z794 Long term (current) use of insulin: Secondary | ICD-10-CM

## 2012-06-16 DIAGNOSIS — Z9119 Patient's noncompliance with other medical treatment and regimen: Secondary | ICD-10-CM

## 2012-06-16 LAB — URINE MICROSCOPIC-ADD ON

## 2012-06-16 LAB — CBC WITH DIFFERENTIAL/PLATELET
Basophils Absolute: 0.1 10*3/uL (ref 0.0–0.1)
Lymphocytes Relative: 12 % (ref 12–46)
Lymphs Abs: 1.9 10*3/uL (ref 0.7–4.0)
Neutro Abs: 12 10*3/uL — ABNORMAL HIGH (ref 1.7–7.7)
Neutrophils Relative %: 75 % (ref 43–77)
Platelets: 211 10*3/uL (ref 150–400)
RBC: 4.43 MIL/uL (ref 3.87–5.11)
RDW: 13.1 % (ref 11.5–15.5)
WBC: 16.1 10*3/uL — ABNORMAL HIGH (ref 4.0–10.5)

## 2012-06-16 LAB — COMPREHENSIVE METABOLIC PANEL
ALT: 27 U/L (ref 0–35)
Alkaline Phosphatase: 72 U/L (ref 39–117)
CO2: 23 mEq/L (ref 19–32)
Chloride: 96 mEq/L (ref 96–112)
GFR calc Af Amer: 67 mL/min — ABNORMAL LOW (ref >90)
Glucose, Bld: 369 mg/dL — ABNORMAL HIGH (ref 70–99)
Potassium: 3.5 mEq/L (ref 3.5–5.1)
Sodium: 133 mEq/L — ABNORMAL LOW (ref 135–145)
Total Bilirubin: 0.4 mg/dL (ref 0.3–1.2)
Total Protein: 7.8 g/dL (ref 6.0–8.3)

## 2012-06-16 LAB — URINALYSIS, ROUTINE W REFLEX MICROSCOPIC
Bilirubin Urine: NEGATIVE
Ketones, ur: 15 mg/dL — AB
Nitrite: NEGATIVE
pH: 6 (ref 5.0–8.0)

## 2012-06-16 MED ORDER — ACETAMINOPHEN 650 MG RE SUPP
RECTAL | Status: AC
  Filled 2012-06-16: qty 1

## 2012-06-16 MED ORDER — ACETAMINOPHEN 650 MG RE SUPP
650.0000 mg | Freq: Once | RECTAL | Status: AC
  Administered 2012-06-16: 650 mg via RECTAL

## 2012-06-16 MED ORDER — SODIUM CHLORIDE 0.9 % IV BOLUS (SEPSIS)
500.0000 mL | Freq: Once | INTRAVENOUS | Status: AC
  Administered 2012-06-16: 500 mL via INTRAVENOUS

## 2012-06-16 NOTE — ED Provider Notes (Signed)
History     CSN: [id number]  Arrival date & time 06/16/12  2014   First MD Initiated Contact with Patient 06/16/12 2017      Chief Complaint  Patient presents with  . Cough    (Consider location/radiation/quality/duration/timing/severity/associated sxs/prior treatment) HPI  Patient is a 72 year old female past medical history significant for CVA, diabetes mellitus presenting via EMS for a progressively worsening nonproductive cough that began on Sunday with associated fevers, chills, nausea, chest tightness, difficulty breathing. Patient lives at home alone with family who checks on her daily along with a health aide but stops by. Patient was hospitalized one month ago. Patient is non-verbal status post CVA in 2002 and 2003 able to communicate with writing.   Past Medical History  Diagnosis Date  . 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 Communicates by writing   . Hypertension   . Hypercholesteremia   . Complication of anesthesia     lung   . Angina   . GERD (gastroesophageal reflux disease)   . Myocardial infarction     CABG 2001, ? stent. Cath 02/2011 with new distal LIMA-LAD 80%. SVG-OM occlusion is old, rest of grafts patent  . Peripheral vascular disease   . Diabetes mellitus   . H/O hiatal hernia     Past Surgical History  Procedure Laterality Date  . Coronary artery bypass graft    . Abdominal hysterectomy    . Breast biopsy    . Esophagogastroduodenoscopy  03/12/2011    Procedure: ESOPHAGOGASTRODUODENOSCOPY (EGD);  Surgeon: Theda Belfast, MD;  Location: [facility] ENDOSCOPY;  Service: Endoscopy;  Laterality: N/A;  . Cardiac catheterization  02/2011    Family History  Problem Relation Age of Onset  . Anesthesia problems Neg Hx   . Hypotension Neg Hx   . Malignant hyperthermia Neg Hx   . Pseudochol deficiency Neg Hx     History  Substance Use Topics  . Smoking status: Never Smoker   . Smokeless tobacco: Not on file  . Alcohol Use: No     OB History   Grav Para Term Preterm Abortions TAB SAB Ect Mult Living                  Review of Systems  Constitutional: Positive for fever and chills.  HENT: Positive for congestion.   Eyes: Negative.   Respiratory: Positive for cough, chest tightness and shortness of breath.   Cardiovascular: Negative for chest pain.  Gastrointestinal: Positive for nausea. Negative for vomiting and diarrhea.  Genitourinary: Negative for dysuria.  Musculoskeletal: Positive for myalgias.  Skin: Negative.   Psychiatric/Behavioral: Negative.     Allergies  Penicillins  Home Medications   Current Outpatient Rx  Name  Route  Sig  Dispense  Refill  . Cholecalciferol (VITAMIN D PO)   Oral   Take 1 tablet by mouth daily.         . clopidogrel (PLAVIX) 75 MG tablet   Oral   Take 75 mg by mouth daily.           Marland Kitchen escitalopram (LEXAPRO) 20 MG tablet   Oral   Take 20 mg by mouth daily.         . hydrochlorothiazide (HYDRODIURIL) 25 MG tablet   Oral   Take 25 mg by mouth daily.           . insulin aspart protamine-insulin aspart (NOVOLOG 70/30) (70-30) 100 UNIT/ML injection   Subcutaneous   Inject 11-38 Units  into the skin 2 (two) times daily with a meal. Uses 38 units in the morning and 11 units at night         . nitroGLYCERIN (NITROSTAT) 0.4 MG SL tablet   Sublingual   Place 0.4 mg under the tongue every 5 (five) minutes as needed for chest pain.         . rosuvastatin (CRESTOR) 40 MG tablet   Oral   Take 40 mg by mouth daily.           BP 141/59  Pulse 89  Temp(Src) 101.3 F (38.5 C) (Oral)  Resp 18  SpO2 100%  Physical Exam  Constitutional: She appears well-developed and well-nourished. She has a sickly appearance. She appears ill.  HENT:  Head: Normocephalic and atraumatic.  Nose: Rhinorrhea present.  Mouth/Throat: Mucous membranes are dry. No edematous. No tonsillar abscesses.  Eyes: EOM are normal.  Neck: Neck supple.  Cardiovascular: Normal rate  and regular rhythm.   Pulmonary/Chest: No accessory muscle usage. She has rales in the right upper field and the right middle field.  Abdominal: Soft. There is no tenderness.  Neurological: She is alert.  Skin: Skin is warm. No rash noted.    ED Course  Procedures (including critical care time)  Hospitalist consulted for admission.  Labs Reviewed  CBC WITH DIFFERENTIAL - Abnormal; Notable for the following:    WBC 16.1 (*)    Hemoglobin 11.7 (*)    Neutro Abs 12.0 (*)    Monocytes Relative 13 (*)    Monocytes Absolute 2.2 (*)    All other components within normal limits  COMPREHENSIVE METABOLIC PANEL - Abnormal; Notable for the following:    Sodium 133 (*)    Glucose, Bld 369 (*)    Albumin 2.9 (*)    GFR calc non Af Amer 57 (*)    GFR calc Af Amer 67 (*)    All other components within normal limits  URINALYSIS, ROUTINE W REFLEX MICROSCOPIC - Abnormal; Notable for the following:    APPearance CLOUDY (*)    Specific Gravity, Urine 1.035 (*)    Glucose, UA >1000 (*)    Hgb urine dipstick MODERATE (*)    Ketones, ur 15 (*)    Protein, ur 100 (*)    Urobilinogen, UA 2.0 (*)    All other components within normal limits  CULTURE, BLOOD (ROUTINE X 2)  CULTURE, BLOOD (ROUTINE X 2)  URINE CULTURE  URINE MICROSCOPIC-ADD ON   Dg Chest 2 View  06/16/2012  *RADIOLOGY REPORT*  Clinical Data: 72 year old female shortness of breath and weakness.  CHEST - 2 VIEW  Comparison: 05/04/2012.  Findings: Semi upright AP and lateral views of the chest.  New right upper lobe consolidation, widespread.  Bordering the major and minor fissures.  Stable lung volumes.  Sequelae of CABG.  Left lung is stable and clear. Nonobstructed bowel gas pattern, no free air.  Visualized tracheal air column is within normal limits.  Mild compression fracture lower thoracic spine age indeterminate.  IMPRESSION: Extensive right upper lobe pneumonia. Recommend post-treatment radiographs to document resolution.   Original  Report Authenticated By: Erskine Speed, M.D.      1. Chest pain   2. Diabetes mellitus   3. HTN (hypertension)       MDM  Patient meets criteria for sepsis and will be admitted for further evaluation and treatment. The patient appears reasonably stabilized for admission considering the current resources, flow, and capabilities available in the ED  at this time, and I doubt any other [facility] requiring further screening and/or treatment in the ED prior to admission. Patient d/w with Dr. Judd Lien, agrees with plan.           Jeannetta Ellis, PA-C 06/17/12 0011

## 2012-06-16 NOTE — ED Notes (Signed)
Called to give report nurse unavailable will call back.  

## 2012-06-16 NOTE — ED Notes (Signed)
Pt c/o cough started Sunday per ems. Pt is nonverbal but can communicate through writing on board. Pt lives at home alone. Pt has hha that comes out and daughter stops by periodically

## 2012-06-16 NOTE — ED Notes (Signed)
AOZ:HY86<VH> Expected date:06/16/12<BR> Expected time: 8:06 PM<BR> Means of arrival:Ambulance<BR> Comments:<BR> 72 yo F  Cough, ?UTI

## 2012-06-16 NOTE — H&P (Signed)
PCP: Ralene Ok, MD  Cardiology: Jacinto Halim  Chief Complaint:  cough  HPI: [name] [name] is a 72 y.o. female   has a past medical history of Stroke (2002 and 2003); Hypertension; Hypercholesteremia; Complication of anesthesia; Angina; GERD (gastroesophageal reflux disease); Myocardial infarction; Peripheral vascular disease; Diabetes mellitus; and H/O hiatal hernia.   Presented with  2-3 days of cough and weakness. She have had some chest pain but only when she coughs, no pleuritic pain. Per nurse at home she have had some fever. Family states her appetite has been low. Her blood sugars have been harder to control for the past few days. She has hx of CVA 10 years ago that have left her with Right sided hemiparesis and aphasia. She is able to sign yes and no. History and ROS obtained with help of family. Patient was brought to emergency department and was found to have right upper lobe pneumonia. Hospitalist was called for admission. Of note patient's family states that she supposed to be on dysphagia diet Nectar thick fluids but  the patient does not comply family is unsure of exact diet that she should be on.  Of note the patient has been recently hospitalized for TIA last month. Review of Systems:    Pertinent positives include: Fevers, chills, fatigue, chest pain, non-productive cough,  Constitutional:  No weight loss, night sweats, , weight loss  HEENT:  No headaches, Difficulty swallowing,Tooth/dental problems,Sore throat,  No sneezing, itching, ear ache, nasal congestion, post nasal drip,  Cardio-vascular:  No Orthopnea, PND, anasarca, dizziness, palpitations.no Bilateral lower extremity swelling  GI:  No heartburn, indigestion, abdominal pain, nausea, vomiting, diarrhea, change in bowel habits, loss of appetite, melena, blood in stool, hematemesis Resp:  no shortness of breath at rest. No dyspnea on exertion, No excess mucus, no productive cough, No  No coughing up of blood. No change  in color of mucus. No wheezing.  Skin:  no rash or lesions. No jaundice GU:  no dysuria, change in color of urine, no urgency or frequency. No straining to urinate.  No flank pain.  Musculoskeletal:  No joint pain or no joint swelling. No decreased range of motion. No back pain.  Psych:  No change in mood or affect. No depression or anxiety. No memory loss.  Neuro: no localizing neurological complaints, no tingling, no weakness, no double vision, no gait abnormality, no slurred speech, no confusion  Otherwise ROS are negative except for above, 10 systems were reviewed  Past Medical History: Past Medical History  Diagnosis Date  . 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 Communicates by writing   . Hypertension   . Hypercholesteremia   . Complication of anesthesia     lung   . Angina   . GERD (gastroesophageal reflux disease)   . Myocardial infarction     CABG 2001, ? stent. Cath 02/2011 with new distal LIMA-LAD 80%. SVG-OM occlusion is old, rest of grafts patent  . Peripheral vascular disease   . Diabetes mellitus   . H/O hiatal hernia    Past Surgical History  Procedure Laterality Date  . Coronary artery bypass graft    . Abdominal hysterectomy    . Breast biopsy    . Esophagogastroduodenoscopy  03/12/2011    Procedure: ESOPHAGOGASTRODUODENOSCOPY (EGD);  Surgeon: Theda Belfast, MD;  Location: [facility] ENDOSCOPY;  Service: Endoscopy;  Laterality: N/A;  . Cardiac catheterization  02/2011     Medications: Prior to Admission medications   Medication Sig Start  Date End Date Taking? Authorizing Provider  Cholecalciferol (VITAMIN D PO) Take 1 tablet by mouth daily.    Historical Provider, MD  clopidogrel (PLAVIX) 75 MG tablet Take 75 mg by mouth daily.      Historical Provider, MD  escitalopram (LEXAPRO) 20 MG tablet Take 20 mg by mouth daily.    Historical Provider, MD  hydrochlorothiazide (HYDRODIURIL) 25 MG tablet Take 25 mg by mouth daily.      Historical  Provider, MD  insulin aspart protamine-insulin aspart (NOVOLOG 70/30) (70-30) 100 UNIT/ML injection Inject 11-38 Units into the skin 2 (two) times daily with a meal. Uses 38 units in the morning and 11 units at night    Historical Provider, MD  nitroGLYCERIN (NITROSTAT) 0.4 MG SL tablet Place 0.4 mg under the tongue every 5 (five) minutes as needed for chest pain.    Historical Provider, MD  rosuvastatin (CRESTOR) 40 MG tablet Take 40 mg by mouth daily.    Historical Provider, MD    Allergies:   Allergies  Allergen Reactions  . Penicillins Swelling    Swelling of mouth    Social History:  Non Ambulatory needs power chair Lives at  Home she has daily CNA visits. But no longer has home PT/OT   reports that she has never smoked. She does not have any smokeless tobacco history on file. She reports that she does not drink alcohol or use illicit drugs.   Family History: family history includes Diabetes in her brother and sister; Heart disease in her mother; and Stroke in her father.  There is no history of Anesthesia problems, and Hypotension, and Malignant hyperthermia, and Pseudochol deficiency, .    Physical Exam: Patient Vitals for the past 24 hrs:  BP Temp Temp src Pulse Resp SpO2  06/16/12 2022 141/59 mmHg 101.3 F (38.5 C) Oral 89 18 100 %    1. General:  in No Acute distress 2. Psychological: Alert and  Oriented 3. Head/ENT:   Moist Mucous Membranes                          Head Non traumatic, neck supple                            Poor Dentition 4. SKIN: normal   Skin turgor,  Skin clean Dry and intact no rash 5. Heart: Regular rate and rhythm no Murmur, Rub or gallop 6. Lungs:  no wheezes with some coarse breath sounds on the right  7. Abdomen: Soft, non-tender, Non distended obese 8. Lower extremities: no clubbing, cyanosis, or edema 9. Neurologically strength within normal limits in the left upper lobe extremity but diminished to 2/5 in right upper and lower extremity.  Right facial droop noted  10. MSK: Normal range of motion  body mass index is unknown because there is no weight on file.   Labs on Admission:   Recent Labs  06/16/12 2045  NA 133*  K 3.5  CL 96  CO2 23  GLUCOSE 369*  BUN 14  CREATININE 0.97  CALCIUM 9.6    Recent Labs  06/16/12 2045  AST 27  ALT 27  ALKPHOS 72  BILITOT 0.4  PROT 7.8  ALBUMIN 2.9*   No results found for this basename: LIPASE, AMYLASE,  in the last 72 hours  Recent Labs  06/16/12 2045  WBC 16.1*  NEUTROABS 12.0*  HGB 11.7*  HCT 36.0  MCV 81.3  PLT 211   No results found for this basename: CKTOTAL, CKMB, CKMBINDEX, TROPONINI,  in the last 72 hours No results found for this basename: TSH, T4TOTAL, FREET3, T3FREE, THYROIDAB,  in the last 72 hours No results found for this basename: VITAMINB12, FOLATE, FERRITIN, TIBC, IRON, RETICCTPCT,  in the last 72 hours Lab Results  Component Value Date   HGBA1C 8.7* 05/05/2012    The CrCl is unknown because both a height and weight (above a minimum accepted value) are required for this calculation. ABG    Component Value Date/Time   TCO2 28 05/04/2012 1618     No results found for this basename: DDIMER    UA no evidence of UTI   Cultures:    Component Value Date/Time   SDES URINE, CATHETERIZED 03/10/2011 0257   SPECREQUEST NONE 03/10/2011 0257   CULT PROTEUS MIRABILIS 03/10/2011 0257   REPTSTATUS 03/12/2011 FINAL 03/10/2011 0257       Radiological Exams on Admission: Dg Chest 2 View  06/16/2012  *RADIOLOGY REPORT*  Clinical Data: 72 year old female shortness of breath and weakness.  CHEST - 2 VIEW  Comparison: 05/04/2012.  Findings: Semi upright AP and lateral views of the chest.  New right upper lobe consolidation, widespread.  Bordering the major and minor fissures.  Stable lung volumes.  Sequelae of CABG.  Left lung is stable and clear. Nonobstructed bowel gas pattern, no free air.  Visualized tracheal air column is within normal limits.  Mild  compression fracture lower thoracic spine age indeterminate.  IMPRESSION: Extensive right upper lobe pneumonia. Recommend post-treatment radiographs to document resolution.   Original Report Authenticated By: Erskine Speed, M.D.     Chart has been reviewed  Assessment/Plan  This is a 72 year old female with healthcare associated pneumonia. Possible aspiration could not be ruled out.  Present on Admission:  . Healthcare-associated pneumonia - patient has penicillin allergy will start on aztreonam, vancomycin and Levaquin. Blood cultures obtained in ER. Oxygen as needed. Speech pathology to evaluate for possible aspiration risk.  Marland Kitchen HTN (hypertension) - continue home medications currently stable  . Diabetes mellitus -well inpatient will change to Lantus 20 units subcutaneous each bedtime this may need to be titrated. The patient's blood sugar control is likely worsening secondary to current infection. Chest pain - atypical but given history of coronary artery disease will cycle cardiac enzymes and watch in telemetry obtain serial EKG.  Prophylaxis: Lovenox, Protonix  CODE STATUS: FULL CODE  Other plan as per orders.  I have spent a total of 55 min on this admission  [name] [name] 06/16/2012, 10:57 PM

## 2012-06-17 ENCOUNTER — Inpatient Hospital Stay (HOSPITAL_COMMUNITY): Payer: Medicare HMO

## 2012-06-17 DIAGNOSIS — I6350 Cerebral infarction due to unspecified occlusion or stenosis of unspecified cerebral artery: Secondary | ICD-10-CM

## 2012-06-17 DIAGNOSIS — I2510 Atherosclerotic heart disease of native coronary artery without angina pectoris: Secondary | ICD-10-CM

## 2012-06-17 LAB — CBC WITH DIFFERENTIAL/PLATELET
Basophils Relative: 0 % (ref 0–1)
Eosinophils Relative: 0 % (ref 0–5)
HCT: 33.2 % — ABNORMAL LOW (ref 36.0–46.0)
Lymphs Abs: 1.5 10*3/uL (ref 0.7–4.0)
MCH: 26.4 pg (ref 26.0–34.0)
MCV: 81.8 fL (ref 78.0–100.0)
Monocytes Absolute: 1.5 10*3/uL — ABNORMAL HIGH (ref 0.1–1.0)
Monocytes Relative: 13 % — ABNORMAL HIGH (ref 3–12)
Neutro Abs: 8.3 10*3/uL — ABNORMAL HIGH (ref 1.7–7.7)
Platelets: ADEQUATE 10*3/uL (ref 150–400)
RBC: 4.06 MIL/uL (ref 3.87–5.11)

## 2012-06-17 LAB — GLUCOSE, CAPILLARY
Glucose-Capillary: 310 mg/dL — ABNORMAL HIGH (ref 70–99)
Glucose-Capillary: 200 mg/dL — ABNORMAL HIGH (ref 70–99)
Glucose-Capillary: 173 mg/dL — ABNORMAL HIGH (ref 70–99)
Glucose-Capillary: 167 mg/dL — ABNORMAL HIGH (ref 70–99)

## 2012-06-17 LAB — COMPREHENSIVE METABOLIC PANEL
AST: 25 U/L (ref 0–37)
Albumin: 2.4 g/dL — ABNORMAL LOW (ref 3.5–5.2)
Alkaline Phosphatase: 60 U/L (ref 39–117)
Chloride: 100 mEq/L (ref 96–112)
Creatinine, Ser: 0.92 mg/dL (ref 0.50–1.10)
Potassium: 3.4 mEq/L — ABNORMAL LOW (ref 3.5–5.1)
Total Bilirubin: 0.4 mg/dL (ref 0.3–1.2)
Total Protein: 6.8 g/dL (ref 6.0–8.3)

## 2012-06-17 LAB — TROPONIN I
Troponin I: <0.3 ng/mL (ref <0.30)
Troponin I: <0.3 ng/mL (ref <0.30)
Troponin I: <0.3 ng/mL (ref <0.30)

## 2012-06-17 MED ORDER — ASPIRIN 81 MG PO CHEW
81.0000 mg | CHEWABLE_TABLET | Freq: Every day | ORAL | Status: DC
  Administered 2012-06-20: 81 mg via ORAL
  Filled 2012-06-17 (×5): qty 1

## 2012-06-17 MED ORDER — INSULIN GLARGINE 100 UNIT/ML ~~LOC~~ SOLN
10.0000 [IU] | Freq: Once | SUBCUTANEOUS | Status: AC
  Administered 2012-06-17: 10 [IU] via SUBCUTANEOUS
  Filled 2012-06-17: qty 0.1

## 2012-06-17 MED ORDER — INSULIN ASPART 100 UNIT/ML ~~LOC~~ SOLN
0.0000 [IU] | Freq: Four times a day (QID) | SUBCUTANEOUS | Status: DC
  Administered 2012-06-17 (×2): 3 [IU] via SUBCUTANEOUS
  Administered 2012-06-18: 5 [IU] via SUBCUTANEOUS
  Administered 2012-06-18: 2 [IU] via SUBCUTANEOUS
  Administered 2012-06-18 – 2012-06-19 (×3): 3 [IU] via SUBCUTANEOUS
  Administered 2012-06-19: 5 [IU] via SUBCUTANEOUS
  Administered 2012-06-19: 2 [IU] via SUBCUTANEOUS
  Administered 2012-06-19 – 2012-06-20 (×2): 5 [IU] via SUBCUTANEOUS
  Administered 2012-06-20: 2 [IU] via SUBCUTANEOUS
  Administered 2012-06-20 – 2012-06-21 (×2): 3 [IU] via SUBCUTANEOUS
  Administered 2012-06-21: 8 [IU] via SUBCUTANEOUS
  Administered 2012-06-21: 2 [IU] via SUBCUTANEOUS
  Administered 2012-06-22 (×2): 5 [IU] via SUBCUTANEOUS
  Administered 2012-06-22: 2 [IU] via SUBCUTANEOUS
  Administered 2012-06-22: 5 [IU] via SUBCUTANEOUS
  Administered 2012-06-23: 3 [IU] via SUBCUTANEOUS
  Administered 2012-06-23: 2 [IU] via SUBCUTANEOUS

## 2012-06-17 MED ORDER — PANTOPRAZOLE SODIUM 40 MG PO TBEC
40.0000 mg | DELAYED_RELEASE_TABLET | Freq: Every day | ORAL | Status: DC
  Filled 2012-06-17 (×2): qty 1

## 2012-06-17 MED ORDER — ACETAMINOPHEN 650 MG RE SUPP
650.0000 mg | Freq: Once | RECTAL | Status: AC
  Administered 2012-06-17: 650 mg via RECTAL
  Filled 2012-06-17: qty 1

## 2012-06-17 MED ORDER — INSULIN ASPART 100 UNIT/ML ~~LOC~~ SOLN
0.0000 [IU] | Freq: Every day | SUBCUTANEOUS | Status: DC
Start: 2012-06-17 — End: 2012-06-23
  Administered 2012-06-17: 4 [IU] via SUBCUTANEOUS
  Administered 2012-06-21: 2 [IU] via SUBCUTANEOUS

## 2012-06-17 MED ORDER — LEVOFLOXACIN IN D5W 750 MG/150ML IV SOLN
750.0000 mg | Freq: Every day | INTRAVENOUS | Status: DC
  Administered 2012-06-17: 750 mg via INTRAVENOUS
  Filled 2012-06-17: qty 150

## 2012-06-17 MED ORDER — POTASSIUM CHLORIDE IN NACL 20-0.9 MEQ/L-% IV SOLN
INTRAVENOUS | Status: DC
  Administered 2012-06-17: 12:00:00 via INTRAVENOUS
  Filled 2012-06-17 (×2): qty 1000

## 2012-06-17 MED ORDER — INSULIN ASPART 100 UNIT/ML ~~LOC~~ SOLN
0.0000 [IU] | Freq: Three times a day (TID) | SUBCUTANEOUS | Status: DC
  Administered 2012-06-17: 8 [IU] via SUBCUTANEOUS

## 2012-06-17 MED ORDER — ESCITALOPRAM OXALATE 20 MG PO TABS
20.0000 mg | ORAL_TABLET | Freq: Every day | ORAL | Status: DC
  Administered 2012-06-20: 20 mg via ORAL
  Filled 2012-06-17 (×7): qty 1

## 2012-06-17 MED ORDER — BIOTENE DRY MOUTH MT LIQD
15.0000 mL | Freq: Two times a day (BID) | OROMUCOSAL | Status: DC
  Administered 2012-06-17 – 2012-06-23 (×12): 15 mL via OROMUCOSAL

## 2012-06-17 MED ORDER — INSULIN GLARGINE 100 UNIT/ML ~~LOC~~ SOLN
20.0000 [IU] | Freq: Every day | SUBCUTANEOUS | Status: DC
  Administered 2012-06-17: 20 [IU] via SUBCUTANEOUS
  Filled 2012-06-17: qty 0.2

## 2012-06-17 MED ORDER — METOPROLOL TARTRATE 25 MG PO TABS
25.0000 mg | ORAL_TABLET | Freq: Two times a day (BID) | ORAL | Status: DC
  Administered 2012-06-19 – 2012-06-20 (×3): 25 mg via ORAL
  Filled 2012-06-17 (×12): qty 1

## 2012-06-17 MED ORDER — DEXTROSE 5 % IV SOLN
2.0000 g | Freq: Three times a day (TID) | INTRAVENOUS | Status: DC
  Administered 2012-06-17 – 2012-06-23 (×20): 2 g via INTRAVENOUS
  Filled 2012-06-17 (×21): qty 2

## 2012-06-17 MED ORDER — SODIUM CHLORIDE 0.9 % IV SOLN
INTRAVENOUS | Status: DC
  Administered 2012-06-17: 02:00:00 via INTRAVENOUS

## 2012-06-17 MED ORDER — PNEUMOCOCCAL VAC POLYVALENT 25 MCG/0.5ML IJ INJ
0.5000 mL | INJECTION | INTRAMUSCULAR | Status: AC
  Administered 2012-06-18: 0.5 mL via INTRAMUSCULAR
  Filled 2012-06-17 (×3): qty 0.5

## 2012-06-17 MED ORDER — INSULIN GLARGINE 100 UNIT/ML ~~LOC~~ SOLN
30.0000 [IU] | Freq: Every day | SUBCUTANEOUS | Status: DC
  Administered 2012-06-17 – 2012-06-19 (×3): 30 [IU] via SUBCUTANEOUS
  Filled 2012-06-17 (×3): qty 0.3

## 2012-06-17 MED ORDER — CLOPIDOGREL BISULFATE 75 MG PO TABS
75.0000 mg | ORAL_TABLET | Freq: Every day | ORAL | Status: DC
  Administered 2012-06-20 – 2012-06-23 (×2): 75 mg via ORAL
  Filled 2012-06-17 (×9): qty 1

## 2012-06-17 MED ORDER — VANCOMYCIN HCL IN DEXTROSE 750-5 MG/150ML-% IV SOLN
750.0000 mg | Freq: Two times a day (BID) | INTRAVENOUS | Status: DC
  Administered 2012-06-17 – 2012-06-21 (×9): 750 mg via INTRAVENOUS
  Filled 2012-06-17 (×10): qty 150

## 2012-06-17 MED ORDER — ENOXAPARIN SODIUM 40 MG/0.4ML ~~LOC~~ SOLN
40.0000 mg | SUBCUTANEOUS | Status: DC
  Administered 2012-06-17 – 2012-06-23 (×7): 40 mg via SUBCUTANEOUS
  Filled 2012-06-17 (×7): qty 0.4

## 2012-06-17 MED ORDER — ATORVASTATIN CALCIUM 80 MG PO TABS
80.0000 mg | ORAL_TABLET | Freq: Every day | ORAL | Status: DC
  Filled 2012-06-17 (×2): qty 1

## 2012-06-17 NOTE — Progress Notes (Signed)
INITIAL NUTRITION ASSESSMENT  DOCUMENTATION CODES Per approved criteria  -Not Applicable   INTERVENTION: Rd to provide appropriate nutritional supplements pending further SLP evaluation/recommendations and diet advancement Recommend Palliative consult   NUTRITION DIAGNOSIS: Swallowing difficulty related to previous strokes as evidenced by pt with dysphagia, aphasia, and right sided hemiparesis presents with aspiration pneumonia and coughing.   Goal: Pt to meet >/= 90% of their estimated nutrition needs  Monitor:  SLP recommendations Diet order PO intake Wt Labs  Reason for Assessment: MST  72 y.o. female  Admitting Dx: Healthcare associated aspiration pneumonia  ASSESSMENT: 72 y.o. female has a past medical history of Stroke (2002 and 2003); Hypertension; Hypercholesteremia; Complication of anesthesia; Angina; GERD (gastroesophageal reflux disease); Myocardial infarction; Peripheral vascular disease; Diabetes mellitus; and H/O hiatal hernia. Presented with 2-3 days of cough and weakness.  SLP reported that pt has severe dysphagia and will likely aspirate no matter what diet she is on. Pt reported sudden increased difficulty chewing and swallowing 2 weeks ago per SLP. Pt wrote down informtation to communicate with RD. Pt reported that she usually weighs 170 lbs and has had a poor appetite for the past week. Pt reports that she sometimes drinks 2-3 Ensure supplements per day. RN present in room states that pt cannot have anything by mouth until further swallow evaluation is completed.  Height: Ht Readings from Last 1 Encounters:  06/17/12 5\' 5"  (1.651 m)    Weight: Wt Readings from Last 1 Encounters:  06/17/12 172 lb 3.2 oz (78.109 kg)    Ideal Body Weight: 125 lbs  % Ideal Body Weight: 138%  Wt Readings from Last 10 Encounters:  06/17/12 172 lb 3.2 oz (78.109 kg)  05/05/12 180 lb 11.2 oz (81.965 kg)  07/24/11 180 lb 5.4 oz (81.8 kg)  03/13/11 174 lb (78.926 kg)   03/13/11 174 lb (78.926 kg)  03/13/11 174 lb (78.926 kg)    Usual Body Weight: 170 lbs  % Usual Body Weight: 101%  BMI:  Body mass index is 28.66 kg/(m^2).  Estimated Nutritional Needs: Kcal: 4098-1191 Protein: 78-109 grams Fluid: 2.3 L  Skin: Intact  Diet Order: NPO  EDUCATION NEEDS: -No education needs identified at this time   Intake/Output Summary (Last 24 hours) at 06/17/12 1527 Last data filed at 06/17/12 1500  Gross per 24 hour  Intake 1017.5 ml  Output      0 ml  Net 1017.5 ml    Last BM: PTA  Labs:   Recent Labs Lab 06/16/12 2045 06/17/12 0649  NA 133* 135  K 3.5 3.4*  CL 96 100  CO2 23 25  BUN 14 13  CREATININE 0.97 0.92  CALCIUM 9.6 8.9  GLUCOSE 369* 326*    CBG (last 3)   Recent Labs  06/17/12 0143 06/17/12 0718 06/17/12 1144  GLUCAP 310* 282* 200*    Scheduled Meds: . antiseptic oral rinse  15 mL Mouth Rinse BID  . aspirin  81 mg Oral Daily  . atorvastatin  80 mg Oral q1800  . aztreonam  2 g Intravenous Q8H  . clopidogrel  75 mg Oral Daily  . enoxaparin (LOVENOX) injection  40 mg Subcutaneous Q24H  . escitalopram  20 mg Oral Daily  . insulin aspart  0-15 Units Subcutaneous Q6H  . insulin aspart  0-5 Units Subcutaneous QHS  . insulin glargine  30 Units Subcutaneous QHS  . metoprolol tartrate  25 mg Oral BID  . pantoprazole  40 mg Oral Q1200  . [START ON 06/18/2012]  pneumococcal 23 valent vaccine  0.5 mL Intramuscular Tomorrow-1000  . vancomycin  750 mg Intravenous Q12H    Continuous Infusions: . 0.9 % NaCl with KCl 20 mEq / L 50 mL/hr at 06/17/12 1206    Past Medical History  Diagnosis Date  . 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 Communicates by writing   . Hypertension   . Hypercholesteremia   . Complication of anesthesia     lung   . Angina   . GERD (gastroesophageal reflux disease)   . Myocardial infarction     CABG 2001, ? stent. Cath 02/2011 with new distal LIMA-LAD 80%. SVG-OM  occlusion is old, rest of grafts patent  . Peripheral vascular disease   . Diabetes mellitus   . H/O hiatal hernia     Past Surgical History  Procedure Laterality Date  . Coronary artery bypass graft    . Abdominal hysterectomy    . Breast biopsy    . Esophagogastroduodenoscopy  03/12/2011    Procedure: ESOPHAGOGASTRODUODENOSCOPY (EGD);  Surgeon: Theda Belfast, MD;  Location: [facility] ENDOSCOPY;  Service: Endoscopy;  Laterality: N/A;  . Cardiac catheterization  02/2011    Ian Malkin RD, LDN Inpatient Clinical Dietitian Pager: [phone] After Hours Pager: [phone]

## 2012-06-17 NOTE — Progress Notes (Addendum)
ANTIBIOTIC CONSULT NOTE - INITIAL  Pharmacy Consult for Vanco and Aztreonam Indication: HCAP  Allergies  Allergen Reactions  . Penicillins Swelling    Swelling of mouth    Patient Measurements: Height: 5\' 5"  (165.1 cm) Weight: 172 lb 3.2 oz (78.109 kg) IBW/kg (Calculated) : 57  Vital Signs: Temp: 98.1 F (36.7 C) (04/24 0500) Temp src: Oral (04/24 0500) BP: 111/67 mmHg (04/24 0500) Pulse Rate: 57 (04/24 0500)  Labs:  Recent Labs  06/16/12 2045 06/17/12 0649  WBC 16.1* 11.3*  HGB 11.7* 10.7*  PLT 211 PLATELET CLUMPS NOTED ON SMEAR, COUNT APPEARS ADEQUATE  CREATININE 0.97 0.92   Estimated Creatinine Clearance: 57.9 ml/min (by C-G formula based on Cr of 0.92).    Medical History: Past Medical History  Diagnosis Date  . 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 Communicates by writing   . Hypertension   . Hypercholesteremia   . Complication of anesthesia     lung   . Angina   . GERD (gastroesophageal reflux disease)   . Myocardial infarction     CABG 2001, ? stent. Cath 02/2011 with new distal LIMA-LAD 80%. SVG-OM occlusion is old, rest of grafts patent  . Peripheral vascular disease   . Diabetes mellitus   . H/O hiatal hernia     Medications:  Anti-infectives   Start     Dose/Rate Route Frequency Ordered Stop   06/17/12 0200  aztreonam (AZACTAM) 2 g in dextrose 5 % 50 mL IVPB     2 g 100 mL/hr over 30 Minutes Intravenous Every 8 hours 06/17/12 0035 06/25/12 0159   06/17/12 0200  vancomycin (VANCOCIN) IVPB 750 mg/150 ml premix     750 mg 150 mL/hr over 60 Minutes Intravenous Every 12 hours 06/17/12 0045 06/25/12 0159   06/17/12 0115  levofloxacin (LEVAQUIN) IVPB 750 mg  Status:  Discontinued     750 mg 100 mL/hr over 90 Minutes Intravenous Daily at bedtime 06/17/12 0035 06/17/12 0926     Assessment: 72 yo F with recent hospitalization admitted 4/23 with HCAP / aspiration PNA. Pt has PCN allergy, so was started on Levaquin 750mg   IV q24h, aztreonam 2g q8h, and Vanco 750mg  IV q12h by previous pharmacist. Levaquin has since been d/c'd - have paged MD to make sure this is correct.  Goal of Therapy:  Vancomycin trough level 15-20 mcg/ml  Plan:  1) Continue aztreonam and vanco as ordered by previous pharmacist 2) Awaiting call-back about whether to resume Levaquin or not 3) Check vanco trough at steady state  Darrol Angel, PharmD Pager: [phone] 06/17/2012,11:44 AM  ADDENDUM: No Levaquin per Dr. Thedore Mins.  Darrol Angel, PharmD Pager: [phone] 06/17/2012 1:50 PM

## 2012-06-17 NOTE — Progress Notes (Addendum)
[facility]                                                                                Patient Demographics  [name] [name], is a 72 y.o. female, DOB - 1940/06/17, WUJ:[id number], [id number]:[id number]  Admit date - 06/16/2012  Admitting Physician [name] Doyne, MD  Outpatient Primary MD for the patient is [name] Ok, MD  LOS - 1   Chief Complaint  Patient presents with  . Cough        Assessment & Plan    . Healthcare-associated aspiration pneumonia - due to previous CVA, made her n.p.o. except for medications with sips and assistance, aspiration precautions and feeding assistance was eating, speech therapy to evaluate, follow pending cultures, continue on [name] [name], vancomycin , clinically has responded. Stop [name] [name]. As needed oxygen and nebulizer treatments     . HTN (hypertension) - continue home medications currently stable    . Diabetes mellitus -we'll increase Lantus to 30 units subcutaneous. The new sliding scale and monitor   Lab Results  Component Value Date   HGBA1C 7.5* 06/17/2012    CBG (last 3)   Recent Labs  06/17/12 0143 06/17/12 0718  GLUCAP 310* 282*      Chest pain - atypical and currently resolved, EKG nonspecific changes, troponin negative x2 sets, continue to monitor, place on low-dose aspirin along with low-dose beta blocker, statin to be continued. Telemetry monitoring.     Code Status: Full  Family Communication: None present  Disposition Plan: SNF   Procedures swallow eval, chest x-ray   Consults  speech therapy   DVT Prophylaxis  Lovenox   Lab Results  Component Value Date   PLT PLATELET CLUMPS NOTED ON SMEAR, COUNT APPEARS ADEQUATE 06/17/2012    Medications  Scheduled Meds: . antiseptic oral rinse  15 mL Mouth Rinse BID  . atorvastatin  80 mg Oral q1800  . [name] [name]  2 g Intravenous Q8H  . clopidogrel  75 mg Oral Daily  . enoxaparin (LOVENOX) injection  40 mg Subcutaneous Q24H  . escitalopram  20 mg  Oral Daily  . insulin aspart  0-15 Units Subcutaneous Q6H  . insulin aspart  0-5 Units Subcutaneous QHS  . insulin glargine  20 Units Subcutaneous QHS  . levofloxacin ([name] [name]) IV  750 mg Intravenous QHS  . pantoprazole  40 mg Oral Q1200  . [START ON 06/18/2012] pneumococcal 23 valent vaccine  0.5 mL Intramuscular Tomorrow-1000  . vancomycin  750 mg Intravenous Q12H   Continuous Infusions: . 0.9 % NaCl with KCl 20 mEq / L     PRN Meds:.  Antibiotics    Anti-infectives   Start     Dose/Rate Route Frequency Ordered Stop   06/17/12 0200  [name] [name] (AZACTAM) 2 g in dextrose 5 % 50 mL IVPB     2 g 100 mL/hr over 30 Minutes Intravenous Every 8 hours 06/17/12 0035 06/25/12 0159   06/17/12 0200  vancomycin ([name] [name]) IVPB 750 mg/150 ml premix     750 mg 150 mL/hr over 60 Minutes Intravenous Every 12 hours 06/17/12 0045 06/25/12 0159   06/17/12 0115  levofloxacin ([name] [name]) IVPB 750 mg     750 mg 100 mL/hr over  90 Minutes Intravenous Daily at bedtime 06/17/12 0035 06/19/12 2159       Time Spent in minutes  35   [name] [name] on 06/17/2012 at 9:20 AM  Between 7am to 7pm - Pager - [phone]  After 7pm go to [email or web address] - password [email or web address]  And look for the night coverage person covering for me after hours  [facility] Office  [phone]    Subjective:   [name] [name] today remains aphasia but will communicate through had not and writing using her left hand, No headache, No chest pain, No abdominal pain - No Nausea, No new weakness tingling or numbness, she has cough and shortness of breath, coughs when she eats.  Objective:   Filed Vitals:   06/16/12 2022 06/16/12 2325 06/17/12 0033 06/17/12 0500  BP: 141/59 109/55 120/87 111/67  Pulse: 89 71 69 57  Temp: 101.3 F (38.5 C) 98.8 F (37.1 C) 98.4 F (36.9 C) 98.1 F (36.7 C)  TempSrc: Oral Oral Oral Oral  Resp: 18 24 20 20   Height:   5\' 5"  (1.651 m)   Weight:   78.109 kg (172 lb 3.2 oz)   SpO2:  100% 100% 100% 100%    Wt Readings from Last 3 Encounters:  06/17/12 78.109 kg (172 lb 3.2 oz)  05/05/12 81.965 kg (180 lb 11.2 oz)  07/24/11 81.8 kg (180 lb 5.4 oz)     Intake/Output Summary (Last 24 hours) at 06/17/12 0920 Last data filed at 06/17/12 0916  Gross per 24 hour  Intake  467.5 ml  Output      0 ml  Net  467.5 ml    Exam Awake Alert, Oriented X 3, No new F.N deficits, Normal affect, chronic right-sided weakness from previous CVA and is aphasic [place].AT,PERRAL Supple Neck,No JVD, No cervical lymphadenopathy appriciated.  Symmetrical Chest wall movement, right upper lobe rales RRR,No Gallops,Rubs or new Murmurs, No Parasternal Heave +ve B.Sounds, Abd Soft, Non tender, No organomegaly appriciated, No rebound - guarding or rigidity. No Cyanosis, Clubbing or edema, No new Rash or bruise   Data Review   Micro Results No results found for this or any previous visit (from the past 240 hour(s)).  Radiology Reports Dg Chest 2 View  06/16/2012  *RADIOLOGY REPORT*  Clinical Data: 72 year old female shortness of breath and weakness.  CHEST - 2 VIEW  Comparison: 05/04/2012.  Findings: Semi upright AP and lateral views of the chest.  New right upper lobe consolidation, widespread.  Bordering the major and minor fissures.  Stable lung volumes.  Sequelae of CABG.  Left lung is stable and clear. Nonobstructed bowel gas pattern, no free air.  Visualized tracheal air column is within normal limits.  Mild compression fracture lower thoracic spine age indeterminate.  IMPRESSION: Extensive right upper lobe pneumonia. Recommend post-treatment radiographs to document resolution.   Original Report Authenticated By: [name] [name], [name].    Dg Chest Port 1 View  06/17/2012  *RADIOLOGY REPORT*  Clinical Data: Cough and shortness of breath  PORTABLE CHEST - 1 VIEW  Comparison:  June 16, 2012  Findings:  Extensive right upper lobe consolidation appears stable. Lungs elsewhere clear.  Heart size and  pulmonary vascularity normal.  The patient is status post coronary artery bypass grafting.  Aorta is prominent with atherosclerotic change, stable. No adenopathy.  IMPRESSION: Persistent extensive right upper lobe consolidation. No new opacity. There is thoracic aortic prominence, a finding probably indicative of chronic hypertensive change.   Original Report Authenticated By: Chrissie Noa  Margarita Grizzle, [name].     [facility]  Recent Labs Lab 06/16/12 2045 06/17/12 0649  WBC 16.1* 11.3*  HGB 11.7* 10.7*  HCT 36.0 33.2*  PLT 211 PLATELET CLUMPS NOTED ON SMEAR, COUNT APPEARS ADEQUATE  MCV 81.3 81.8  MCH 26.4 26.4  MCHC 32.5 32.2  RDW 13.1 13.3  LYMPHSABS 1.9 1.5  MONOABS 2.2* 1.5*  EOSABS 0.0 0.0  BASOSABS 0.1 0.0    Chemistries   Recent Labs Lab 06/16/12 2045 06/17/12 0649  NA 133* 135  K 3.5 3.4*  CL 96 100  CO2 23 25  GLUCOSE 369* 326*  BUN 14 13  CREATININE 0.97 0.92  CALCIUM 9.6 8.9  AST 27 25  ALT 27 25  ALKPHOS 72 60  BILITOT 0.4 0.4   ------------------------------------------------------------------------------------------------------------------ estimated creatinine clearance is 57.9 ml/min (by C-G formula based on Cr of 0.92). ------------------------------------------------------------------------------------------------------------------  Recent Labs  06/17/12 0050  HGBA1C 7.5*   ------------------------------------------------------------------------------------------------------------------ No results found for this basename: CHOL, HDL, LDLCALC, TRIG, CHOLHDL, LDLDIRECT,  in the last 72 hours ------------------------------------------------------------------------------------------------------------------ No results found for this basename: TSH, T4TOTAL, FREET3, T3FREE, THYROIDAB,  in the last 72 hours ------------------------------------------------------------------------------------------------------------------ No results found for this basename: VITAMINB12,  FOLATE, FERRITIN, TIBC, IRON, RETICCTPCT,  in the last 72 hours  Coagulation profile No results found for this basename: INR, PROTIME,  in the last 168 hours  No results found for this basename: DDIMER,  in the last 72 hours  Cardiac Enzymes  Recent Labs Lab 06/17/12 0050 06/17/12 0649  TROPONINI <0.30 <0.30   ------------------------------------------------------------------------------------------------------------------ No components found with this basename: POCBNP,

## 2012-06-17 NOTE — Progress Notes (Signed)
PT Cancellation Note  Patient Details Name: [name] [name] MRN: [id number] DOB: [birth year]   Cancelled Treatment:    Reason Eval/Treat Not Completed: Fatigue/lethargy limiting ability to participate Pt familiar with PT and reports not feeling up to therapy today.  Will check back for evaluation tomorrow.   [place] Mielke,KATHrine E 06/17/2012, 2:06 PM Zenovia Jarred, PT, DPT 06/17/2012 Pager: [phone]

## 2012-06-17 NOTE — Progress Notes (Signed)
   CARE MANAGEMENT NOTE 06/17/2012  Patient:  [name],[name]   Account Number:  [id number]  Date Initiated:  06/17/2012  Documentation initiated by:  Jiles Crocker  Subjective/Objective Assessment:   ADMITTED WITH PNEUMONIA     Action/Plan:   PCP: Ralene Ok, MD  Cardiology: Marisa Severin AT HOME ALONE; ACTIVE WITH [facility]; HOME VS SNF AT DISCHARGE   Anticipated DC Date:  06/21/2012   Anticipated DC Plan:  SKILLED NURSING FACILITY      DC Planning Services  CM consult           Status of service:  In process, will continue to follow Medicare Important Message given?  NA - LOS <3 / Initial given by admissions (If response is "NO", the following Medicare IM given date fields will be blank) Per UR Regulation:  Reviewed for med. necessity/level of care/duration of stay Comments:  06/17/2012- B Carren Blakley RN,BSN,MHA

## 2012-06-17 NOTE — Progress Notes (Signed)
[facility]  Patient Status: Active (receiving services up to time of hospitalization)  [facility] is providing the following services: PT and ST  If patient discharges after hours, please call [phone].   [name] [name] 06/17/2012, 1:27 PM

## 2012-06-17 NOTE — Evaluation (Signed)
Clinical/Bedside Swallow Evaluation Patient Details  Name: [name] [name] MRN: [id number] Date of Birth: 1940-11-01  Today's Date: 06/17/2012 Time: 1440-1520 SLP Time Calculation (min): 40 min  Past Medical History:  Past Medical History  Diagnosis Date  . 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 Communicates by writing   . Hypertension   . Hypercholesteremia   . Complication of anesthesia     lung   . Angina   . GERD (gastroesophageal reflux disease)   . Myocardial infarction     CABG 2001, ? stent. Cath 02/2011 with new distal LIMA-LAD 80%. SVG-OM occlusion is old, rest of grafts patent  . Peripheral vascular disease   . Diabetes mellitus   . H/O hiatal hernia    Past Surgical History:  Past Surgical History  Procedure Laterality Date  . Coronary artery bypass graft    . Abdominal hysterectomy    . Breast biopsy    . Esophagogastroduodenoscopy  03/12/2011    Procedure: ESOPHAGOGASTRODUODENOSCOPY (EGD);  Surgeon: Theda Belfast, MD;  Location: [facility] ENDOSCOPY;  Service: Endoscopy;  Laterality: N/A;  . Cardiac catheterization  02/2011   HPI:  72 yo female adm to [facility] with progressive cough x 2-3 days.  Pt had a CVA approx 10 years ago with subsequent dysarthria - communicates via writing,  Right HP, dysphagia, GERD, DM, Hiatal hernia, HTN, Angina, PVD, ? TIA approx one month ago and aspiration pnas.  Pt reports SUDDEN ONSET of worsening dysphagia - coughing with all po - x 2 weeks ago.  Pt previously seen by speech pathology 05/05/2012 after adm with n/va and weakness-at which time pt was placed on reg/thin diet with accepted risks - CXR was clear at that time.   MBS completed in June 2013 indicating severe oral sensory-motor based dysphagia and moderate pharyngeal sensory motor based with nearly absent tongue base retraction- severe pooling in pharynx and + trace aspiration of thin before the swallow.   Rec in June 2013 was puree/nectar with concern for pt abiltiy  to meet nutritional needs.  Pt has apparently [facility]  SLP will attempt to call primary therapist tomorrow.  Today pt coughing on secretions, pt with known h/o difficulties with managing secretions.  CXR indicates severe right upper lobe pna - likely from ongoing aspiration with recent exacerbation.    Assessment / Plan / Recommendation Clinical Impression  Pt presents with clinical indications of gross oropharyngeal dysphagia with symtpoms of aspiration of secretions, trace amount of water via toothette and small cup sip of Ensure - given per pt request.  Pt has chronic dysphagia from her CVAs evidenced by previous swallow evaluations.  Previously pt has stated she wanted to eat with known risks, today she states she does not desire to eat due to discomfort from aspiration/cough.   Pt does however desire to consume smoothies and again reiterates that she does not desire feeding tube.    Given pt report of "sudden" worsening in swallow abilty in the last two weeks resulting in overt coughing, ? source.  Note with admit for possible CVA approx one month ago but CT negative for acute event  Rec pt continue NPO with oral care - slp set up oral suction for pt.    Given chronic medical issues, recent admissions and acute on chronic dysphagia/aspiration - would recommend to consider palliative referral to help this pt establish her goals.  Pt was agreeable to palliative referral for her chronic medical  issues.  Pt hopeful for swallow improvement- ? If this is realistic for overall function.  Suspect ongoing aspiration present that will functionally not be prevented for this pt due to her severity of dysphagia- regardless of diet consistency.  Pt also admits to some weight loss.    SLP to follow up next date - pt agreeable to npo except oral swabs for comfort at this time.  All pt communication was done through her writing - SLP obtained clipboard and communication board for  pt use.    Aspiration Risk  Severe    Diet Recommendation NPO        Other  Recommendations Recommended Consults:  (consider MBS for reassessment given significant progression) Oral Care Recommendations: Oral care QID   Follow Up Recommendations  Home health SLP    Frequency and Duration min 2x/week  1 week   Pertinent Vitals/Pain Febrile last pm, congested    SLP Swallow Goals Patient will utilize recommended strategies during swallow to increase swallowing safety with: Minimal cueing Goal #3: Pt will write ramifications of ongoing aspiration and strategies to decrease amount she aspirates with mod assistance.     Swallow Study Prior Functional Status   independent at home per pt, but noncompliant with diet restrictions and worsening deficits x2 weeks    General HPI: 72 yo female adm to [facility] with progressive cough x 2-3 days.  Pt had a CVA approx 10 years ago with subsequent dysarthria - communicates via writing,  Right HP, dysphagia, GERD, DM, Hiatal hernia, HTN, Angina, PVD, ? TIA approx one month ago and aspiration pnas.  Pt reports SUDDEN ONSET of worsening dysphagia - coughing with all po - x 2 weeks ago.  Pt previously seen by speech pathology 05/05/2012 after adm with n/va and weakness-at which time pt was placed on reg/thin diet with accepted risks - CXR was clear at that time.  MBS completed in June 2013 indicating severe oral sensory-motor based dysphagia and moderate pharyngeal sensory motor based with nearly absent tongue base retraction- severe pooling in pharynx and + trace aspiration of thin before the swallow.   Rec in June 2013 was puree/nectar with concern for pt abiltiy to meet nutritional needs.  Pt has [facility]  SLP will attempt to call primary therapist tomorrow.  Today pt coughing on secretions, unable to manage secretions - which is her baseline.  CXR indicates severe right upper lobe pna - likely from ongoing  aspiration with recent exacerbation.  Type of Study: Bedside swallow evaluation Previous Swallow Assessment: see HPI Diet Prior to this Study: NPO Temperature Spikes Noted: Yes (febrile last pm) Respiratory Status: Supplemental O2 delivered via (comment) History of Recent Intubation: No Behavior/Cognition: Alert;Cooperative (easily fatigues) Oral Cavity - Dentition: Adequate natural dentition Self-Feeding Abilities: Needs assist (today needed assist) Patient Positioning: Upright in bed Baseline Vocal Quality: Aphonic Volitional Cough: Weak;Wet;Congested Volitional Swallow: Unable to elicit    Oral/Motor/Sensory Function Overall Oral Motor/Sensory Function: Impaired Labial ROM: Reduced right Labial Symmetry: Abnormal symmetry right Labial Strength: Reduced Lingual ROM: Reduced right;Reduced left Lingual Symmetry: Abnormal symmetry right Lingual Strength: Reduced Lingual Sensation: Reduced Facial ROM: Reduced right Facial Symmetry: Right droop Facial Strength: Reduced Facial Sensation: Reduced Velum: Impaired right;Impaired left Mandible: Impaired   Ice Chips Ice chips: Not tested   Thin Liquid Thin Liquid: Impaired Presentation:  (water via swab) Oral Phase Impairments: Impaired anterior to posterior transit;Reduced lingual movement/coordination;Reduced labial seal Oral Phase Functional Implications: Right anterior spillage;Prolonged oral  transit;Oral residue Pharyngeal  Phase Impairments: Suspected delayed Swallow;Multiple swallows Other Comments: pt swallows with mouth open-    Nectar Thick Nectar Thick Liquid: Impaired Presentation: Self Fed;Cup Oral Phase Impairments: Reduced labial seal;Reduced lingual movement/coordination;Impaired anterior to posterior transit Oral phase functional implications: Right anterior spillage;Prolonged oral transit Pharyngeal Phase Impairments: Suspected delayed Swallow;Decreased hyoid-laryngeal movement;Cough - Immediate;Multiple  swallows Other Comments: overt discomforting cough apparent   Honey Thick Honey Thick Liquid: Not tested   Puree Puree: Not tested   Solid   GO    Solid: Not tested       Donavan Burnet, MS [facility] SLP [phone]

## 2012-06-17 NOTE — ED Provider Notes (Signed)
Medical screening examination/treatment/procedure(s) were conducted as a shared visit with non-physician practitioner(s) and myself.  I personally evaluated the patient during the encounter.  The patient presents with complaints of shortness of breath, cough, and fever for the past 24 hours.  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  History taken from ecf staff and daughters at bedside.    On exam, she is febrile but vitals are otherwise stable.  She is awake and alert and is in no distress.  The heart is regular rate and rhythm and the vitals are stable.  The lungs are noted to have rales in the right upper lobe.  The abdomen is benign and there is no rash.    The workup reveals an elevated wbc and a right upper lobe infiltrate consistent with pneumonia.  I believe she requires admission for treatment of this.  I have spoken with Dr. Adela Glimpse who agrees to admit the patient.  We discussed the antibiotic choice and Dr. Adela Glimpse has requested that she order the antibiotics.    Geoffery Lyons, MD 06/17/12 [phone]

## 2012-06-18 DIAGNOSIS — I10 Essential (primary) hypertension: Secondary | ICD-10-CM

## 2012-06-18 DIAGNOSIS — R079 Chest pain, unspecified: Secondary | ICD-10-CM

## 2012-06-18 DIAGNOSIS — J189 Pneumonia, unspecified organism: Secondary | ICD-10-CM

## 2012-06-18 DIAGNOSIS — E119 Type 2 diabetes mellitus without complications: Secondary | ICD-10-CM

## 2012-06-18 LAB — GLUCOSE, CAPILLARY: Glucose-Capillary: 181 mg/dL — ABNORMAL HIGH (ref 70–99)

## 2012-06-18 LAB — URINE CULTURE: Colony Count: NO GROWTH

## 2012-06-18 MED ORDER — METOPROLOL TARTRATE 1 MG/ML IV SOLN: 5.0000 mg | INTRAVENOUS | Status: DC | PRN

## 2012-06-18 MED ORDER — ACETAMINOPHEN 650 MG RE SUPP
650.0000 mg | Freq: Four times a day (QID) | RECTAL | Status: DC | PRN
  Administered 2012-06-18 – 2012-06-21 (×3): 650 mg via RECTAL
  Filled 2012-06-18 (×4): qty 1

## 2012-06-18 MED ORDER — ACETAMINOPHEN 650 MG RE SUPP: 325.0000 mg | Freq: Four times a day (QID) | RECTAL | Status: DC | PRN

## 2012-06-18 MED ORDER — PANTOPRAZOLE SODIUM 40 MG IV SOLR
40.0000 mg | Freq: Two times a day (BID) | INTRAVENOUS | Status: DC
  Administered 2012-06-18 – 2012-06-23 (×11): 40 mg via INTRAVENOUS
  Filled 2012-06-18 (×13): qty 40

## 2012-06-18 MED ORDER — ACETAMINOPHEN 80 MG RE SUPP
500.0000 mg | Freq: Four times a day (QID) | RECTAL | Status: DC | PRN
  Filled 2012-06-18: qty 1

## 2012-06-18 MED ORDER — POTASSIUM CHLORIDE IN NACL 20-0.9 MEQ/L-% IV SOLN
INTRAVENOUS | Status: AC
  Administered 2012-06-18: 11:00:00 via INTRAVENOUS
  Filled 2012-06-18 (×2): qty 1000

## 2012-06-18 NOTE — Progress Notes (Signed)
[facility]                                                                                Patient Demographics  [name] [name], is a 72 y.o. female, DOB - 1940/10/03, ZOX:[id number], WJX:[id number]  Admit date - 06/16/2012  Admitting Physician Therisa Doyne, MD  Outpatient Primary MD for the patient is Ralene Ok, MD  LOS - 2   Chief Complaint  Patient presents with  . Cough        Assessment & Plan    . Healthcare-associated aspiration pneumonia - due to previous CVA and dysphagia, made her n.p.o. except for medications with sips and assistance, aspiration precautions and feeding assistance with medications with sips of water, minimize oral medications,  follow pending cultures, continue on aztreonam, vancomycin , clinically has responded. Stoped Levaquin. As needed oxygen and nebulizer treatments. Patient has been evaluated by speech therapist to know the patient from before, they recommend continuing n.p.o. status, patient does not want feeding tubes, and she understands her prognosis is poor without oral diet, she wishes to talk to palliative care who will be consulted.       Marland Kitchen HTN (hypertension) - continue home medications currently stable      . Diabetes mellitus -we'll increased Lantus to 30 units subcutaneous. The new sliding scale and monitor   Lab Results  Component Value Date   HGBA1C 7.5* 06/17/2012    CBG (last 3)   Recent Labs  06/18/12 0019 06/18/12 0657 06/18/12 0744  GLUCAP 178* 182* 181*      Chest pain - atypical and currently resolved, EKG nonspecific changes, troponin negative x2 sets, continue to monitor, place on low-dose aspirin along with low-dose beta blocker, statin to be continued. Telemetry monitoring.     Code Status: Full  Family Communication: None present  Disposition Plan: SNF   Procedures swallow eval, chest x-ray   Consults  speech therapy   DVT Prophylaxis  Lovenox   Lab Results  Component Value  Date   PLT PLATELET CLUMPS NOTED ON SMEAR, COUNT APPEARS ADEQUATE 06/17/2012    Medications  Scheduled Meds: . antiseptic oral rinse  15 mL Mouth Rinse BID  . aspirin  81 mg Oral Daily  . aztreonam  2 g Intravenous Q8H  . clopidogrel  75 mg Oral Daily  . enoxaparin (LOVENOX) injection  40 mg Subcutaneous Q24H  . escitalopram  20 mg Oral Daily  . insulin aspart  0-15 Units Subcutaneous Q6H  . insulin aspart  0-5 Units Subcutaneous QHS  . insulin glargine  30 Units Subcutaneous QHS  . metoprolol tartrate  25 mg Oral BID  . pantoprazole (PROTONIX) IV  40 mg Intravenous Q12H  . pneumococcal 23 valent vaccine  0.5 mL Intramuscular Tomorrow-1000  . vancomycin  750 mg Intravenous Q12H   Continuous Infusions: . 0.9 % NaCl with KCl 20 mEq / L     PRN Meds:.metoprolol  Antibiotics    Anti-infectives   Start     Dose/Rate Route Frequency Ordered Stop   06/17/12 0200  aztreonam (AZACTAM) 2 g in dextrose 5 % 50 mL IVPB     2 g 100 mL/hr over 30 Minutes Intravenous Every  8 hours 06/17/12 0035 06/25/12 0159   06/17/12 0200  vancomycin (VANCOCIN) IVPB 750 mg/150 ml premix     750 mg 150 mL/hr over 60 Minutes Intravenous Every 12 hours 06/17/12 0045 06/25/12 0159   06/17/12 0115  levofloxacin (LEVAQUIN) IVPB 750 mg  Status:  Discontinued     750 mg 100 mL/hr over 90 Minutes Intravenous Daily at bedtime 06/17/12 0035 06/17/12 0926       Time Spent in minutes  35   SINGH,PRASHANT K M.D on 06/18/2012 at 9:39 AM  Between 7am to 7pm - Pager - [phone]  After 7pm go to [email or web address] - password [email or web address]  And look for the night coverage person covering for me after hours  [facility] Office  [phone]    Subjective:   [name] today remains aphasia but will communicate through had not and writing using her left hand, No headache, No chest pain, No abdominal pain - No Nausea, No new weakness tingling or numbness, she has cough and shortness of breath, coughs when  she eats.  Objective:   Filed Vitals:   06/17/12 2100 06/18/12 0005 06/18/12 0025 06/18/12 0540  BP: 114/52   123/70  Pulse: 67   62  Temp: 101 F (38.3 C) 98.5 F (36.9 C) 98.7 F (37.1 C) 98.2 F (36.8 C)  TempSrc: Oral Oral Oral Axillary  Resp: 22   24  Height:      Weight:      SpO2: 100%   100%    Wt Readings from Last 3 Encounters:  06/17/12 78.109 kg (172 lb 3.2 oz)  05/05/12 81.965 kg (180 lb 11.2 oz)  07/24/11 81.8 kg (180 lb 5.4 oz)     Intake/Output Summary (Last 24 hours) at 06/18/12 0939 Last data filed at 06/18/12 0600  Gross per 24 hour  Intake 1345.83 ml  Output      0 ml  Net 1345.83 ml    Exam Awake Alert, Oriented X 3, No new F.N deficits, Normal affect, chronic right-sided weakness from previous CVA and is aphasic [name],PERRAL Supple Neck,No JVD, No cervical lymphadenopathy appriciated.  Symmetrical Chest wall movement, right upper lobe rales RRR,No Gallops,Rubs or new Murmurs, No Parasternal Heave +ve B.Sounds, Abd Soft, Non tender, No organomegaly appriciated, No rebound - guarding or rigidity. No Cyanosis, Clubbing or edema, No new Rash or bruise   Data Review   Micro Results Recent Results (from the past 240 hour(s))  CULTURE, BLOOD (ROUTINE X 2)     Status: None   Collection Time    06/16/12  8:40 PM      Result Value Range Status   Specimen Description BLOOD LEFT ARM   Final   Special Requests BOTTLES DRAWN AEROBIC AND ANAEROBIC   Final   Culture  Setup Time 06/17/2012 06:59   Final   Culture     Final   Value:        BLOOD CULTURE RECEIVED NO GROWTH TO DATE CULTURE WILL BE HELD FOR 5 DAYS BEFORE ISSUING A FINAL NEGATIVE REPORT   Report Status PENDING   Incomplete  CULTURE, BLOOD (ROUTINE X 2)     Status: None   Collection Time    06/16/12  8:45 PM      Result Value Range Status   Specimen Description BLOOD LEFT ARM   Final   Special Requests BOTTLES DRAWN AEROBIC AND ANAEROBIC   Final   Culture  Setup Time 06/17/2012  06:58   Final  Culture     Final   Value:        BLOOD CULTURE RECEIVED NO GROWTH TO DATE CULTURE WILL BE HELD FOR 5 DAYS BEFORE ISSUING A FINAL NEGATIVE REPORT   Report Status PENDING   Incomplete  URINE CULTURE     Status: None   Collection Time    06/16/12  9:40 PM      Result Value Range Status   Specimen Description URINE, CATHETERIZED   Final   Special Requests NONE   Final   Culture  Setup Time 06/17/2012 07:11   Final   Colony Count NO GROWTH   Final   Culture NO GROWTH   Final   Report Status 06/18/2012 FINAL   Final    Radiology Reports Dg Chest 2 View  06/16/2012  *RADIOLOGY REPORT*  Clinical Data: 72 year old female shortness of breath and weakness.  CHEST - 2 VIEW  Comparison: 05/04/2012.  Findings: Semi upright AP and lateral views of the chest.  New right upper lobe consolidation, widespread.  Bordering the major and minor fissures.  Stable lung volumes.  Sequelae of CABG.  Left lung is stable and clear. Nonobstructed bowel gas pattern, no free air.  Visualized tracheal air column is within normal limits.  Mild compression fracture lower thoracic spine age indeterminate.  IMPRESSION: Extensive right upper lobe pneumonia. Recommend post-treatment radiographs to document resolution.   Original Report Authenticated By: Erskine Speed, M.D.    Dg Chest Port 1 View  06/17/2012  *RADIOLOGY REPORT*  Clinical Data: Cough and shortness of breath  PORTABLE CHEST - 1 VIEW  Comparison:  June 16, 2012  Findings:  Extensive right upper lobe consolidation appears stable. Lungs elsewhere clear.  Heart size and pulmonary vascularity normal.  The patient is status post coronary artery bypass grafting.  Aorta is prominent with atherosclerotic change, stable. No adenopathy.  IMPRESSION: Persistent extensive right upper lobe consolidation. No new opacity. There is thoracic aortic prominence, a finding probably indicative of chronic hypertensive change.   Original Report Authenticated By: Bretta Bang, M.D.     CBC  Recent Labs Lab 06/16/12 2045 06/17/12 0649  WBC 16.1* 11.3*  HGB 11.7* 10.7*  HCT 36.0 33.2*  PLT 211 PLATELET CLUMPS NOTED ON SMEAR, COUNT APPEARS ADEQUATE  MCV 81.3 81.8  MCH 26.4 26.4  MCHC 32.5 32.2  RDW 13.1 13.3  LYMPHSABS 1.9 1.5  MONOABS 2.2* 1.5*  EOSABS 0.0 0.0  BASOSABS 0.1 0.0    Chemistries   Recent Labs Lab 06/16/12 2045 06/17/12 0649  NA 133* 135  K 3.5 3.4*  CL 96 100  CO2 23 25  GLUCOSE 369* 326*  BUN 14 13  CREATININE 0.97 0.92  CALCIUM 9.6 8.9  AST 27 25  ALT 27 25  ALKPHOS 72 60  BILITOT 0.4 0.4   ------------------------------------------------------------------------------------------------------------------ estimated creatinine clearance is 57.9 ml/min (by C-G formula based on Cr of 0.92). ------------------------------------------------------------------------------------------------------------------  Recent Labs  06/17/12 0050  HGBA1C 7.5*   ------------------------------------------------------------------------------------------------------------------ No results found for this basename: CHOL, HDL, LDLCALC, TRIG, CHOLHDL, LDLDIRECT,  in the last 72 hours ------------------------------------------------------------------------------------------------------------------ No results found for this basename: TSH, T4TOTAL, FREET3, T3FREE, THYROIDAB,  in the last 72 hours ------------------------------------------------------------------------------------------------------------------ No results found for this basename: VITAMINB12, FOLATE, FERRITIN, TIBC, IRON, RETICCTPCT,  in the last 72 hours  Coagulation profile No results found for this basename: INR, PROTIME,  in the last 168 hours  No results found for this basename: DDIMER,  in the last 72 hours  Cardiac Enzymes  Recent Labs Lab 06/17/12 0050 06/17/12 0649 06/17/12 1300  TROPONINI <0.30 <0.30 <0.30    ------------------------------------------------------------------------------------------------------------------ No components found with this basename: POCBNP,

## 2012-06-18 NOTE — Progress Notes (Signed)
PT Cancellation Note  Patient Details Name: [name] [name] MRN: [id number] DOB: 1940-04-20   Cancelled Treatment:    Reason Eval/Treat Not Completed: Fatigue/lethargy limiting ability to participate  Pt reports not feeling well again today and does not wish to get OOB.  RN also reports palliative consult.   Granite Godman,KATHrine E 06/18/2012, 11:46 AM Zenovia Jarred, PT, DPT 06/18/2012 Pager: [phone]

## 2012-06-18 NOTE — Progress Notes (Signed)
Speech Language Pathology Dysphagia Treatment Patient Details Name: [name] [name] MRN: [id number] DOB: 1941-01-03 Today's Date: 06/18/2012 Time: 4098-1191 SLP Time Calculation (min): 59 min  Assessment / Plan / Recommendation Clinical Impression  Pt seen for skilled dysphagia treatment to assess tolerance and readiness for dietary advancement.  Pt demonstrated marginal improvement in swallowing ability and secretion management compared to yesterday.  However, ongoing weak cough noted even on secretions but much more pronounced with po intake.  Aphonia and weak cough indicative of poor airway protection.   Per conversation with pt - pt writing her responses, pt reports changes in swallow ability x1 weeks - most significantly with difficulties transiting food  orally.  Pt states her tolerance of liquids has been approximately the same.  She continues to decline desire for feeding tube and wants to consume "liquified" diet.    Discussion re: possible repeat MBS took place, but pt states she desires to eat what she wants and feels that she can manage.  Informed pt of risk of ongoing dysphagia and potential impact including aspiration, aspiration pnas, decreased ability to meet nutritional needs and she continues to desire po intake.     Repeat MBS at this time will likely not change this pt's outcomes as she desires to consume diet she thinks she can manage best.  Long term dysphagia present for this pt.    Uncertain of source for sudden worsening of oral dysphagia with solids x1 week but pt was admitted for possible CVA approx 4 weeks prior.    Rec consider allowing pt full liquid (no grits) for comfort per pt desires with pt accepted aspiration risk.  Note palliaitve meeting for next date in am.    SLP to follow up Monday for dysphagia treatment including family education,  tolerance of po diet.       Diet Recommendation  Initiate / Change Diet: Thin liquid (full liquid per pt request - NO  GRITS w/ risk known)    SLP Plan Continue with current plan of care   Pertinent Vitals/Pain Febrile again today, coughing on secretions   Swallowing Goals  SLP Swallowing Goals Swallow Study Goal #2 - Progress: Progressing toward goal Swallow Study Goal #3 - Progress: Progressing toward goal  General Temperature Spikes Noted: Yes Respiratory Status: Supplemental O2 delivered via (comment) Behavior/Cognition: Alert;Cooperative;Pleasant mood Oral Cavity - Dentition: Adequate natural dentition Patient Positioning: Upright in bed  Oral Cavity - Oral Hygiene     Dysphagia Treatment Treatment focused on: Patient/family/caregiver education;Upgraded PO texture trials;Facilitation of oral preparatory phase Family/Caregiver Educated: pt Treatment Methods/Modalities:  (compensatory strategies) Patient observed directly with PO's: Yes Type of PO's observed: Dysphagia 1 (puree);Thin liquids;Nectar-thick liquids Feeding: Able to feed self Liquids provided via: Teaspoon;Cup Oral Phase Signs & Symptoms: Anterior loss/spillage;Prolonged bolus formation;Prolonged oral phase (lingual thrusting, open mouth posture with swallowing) Pharyngeal Phase Signs & Symptoms: Suspected delayed swallow initiation;Delayed cough;Immediate cough (continuing to cough on secretions) Type of cueing: Verbal Amount of cueing: Maximal (to take small sips)   GO    Donavan Burnet, MS [facility] SLP [phone]

## 2012-06-18 NOTE — Progress Notes (Addendum)
Thank you for consulting the Palliative Medicine Team at [facility] to meet your patient's and family's needs.   The reason that you asked [place] to see your patient is for GOC  Follow up:  Patient is alert & oriented and asked for palliative - talked w/pt in room with SLP - made GOC appointment for Sat 4/26 @ 8:30am - left PMT business card with time on it.  Pt agreeable with time/date of meeting.   We have not been able to schedule a meeting - daughter's phone will not accomodate leaving a message and there is no answer.  No other contact information is on chart - also discussed with staff RN and she has no further contact info. Left sticky on chart and left note on shadow chart as well as asking staff RN Tonye Pearson to report off to next shift - have dtr call PMT number below to schedule.  Thanks.   The Surrogate decision make is daughter Andree Coss [phone].   Your patient is able to participate in the meeting with written communication only - pt aphasic. Chalmers Cater RN Liaison   [phone]

## 2012-06-19 DIAGNOSIS — J189 Pneumonia, unspecified organism: Secondary | ICD-10-CM

## 2012-06-19 LAB — GLUCOSE, CAPILLARY
Glucose-Capillary: 135 mg/dL — ABNORMAL HIGH (ref 70–99)
Glucose-Capillary: 214 mg/dL — ABNORMAL HIGH (ref 70–99)
Glucose-Capillary: 229 mg/dL — ABNORMAL HIGH (ref 70–99)

## 2012-06-19 LAB — BASIC METABOLIC PANEL
BUN: 10 mg/dL (ref 6–23)
Calcium: 9 mg/dL (ref 8.4–10.5)
Creatinine, Ser: 0.89 mg/dL (ref 0.50–1.10)
GFR calc Af Amer: 74 mL/min — ABNORMAL LOW (ref >90)
GFR calc non Af Amer: 64 mL/min — ABNORMAL LOW (ref >90)
Glucose, Bld: 153 mg/dL — ABNORMAL HIGH (ref 70–99)

## 2012-06-19 LAB — CBC
HCT: 32.6 % — ABNORMAL LOW (ref 36.0–46.0)
Hemoglobin: 10.7 g/dL — ABNORMAL LOW (ref 12.0–15.0)
MCH: 26.4 pg (ref 26.0–34.0)
MCHC: 32.8 g/dL (ref 30.0–36.0)
RDW: 13.6 % (ref 11.5–15.5)

## 2012-06-19 NOTE — Evaluation (Signed)
Physical Therapy Evaluation Patient Details Name: [name] [name] MRN: [id number] DOB: [birth year] Today's Date: 06/19/2012 Time: 1000-1054 PT Time Calculation (min): 54 min  PT Assessment / Plan / Recommendation Clinical Impression  Pt in with apiration PNA and h/o L CVA, presents with increased weakness over her normal baseline. Feel pt to benefit from skilled PT her in acute care adn pt agrees to further rehab in hopes to be able to return back home at her PLOF.     PT Assessment  Patient needs continued PT services    Follow Up Recommendations  SNF    Does the patient have the potential to tolerate intense rehabilitation      Barriers to Discharge        Equipment Recommendations  None recommended by PT    Recommendations for Other Services     Frequency Min 3X/week    Precautions / Restrictions Precautions Precautions:  (careful with RUE due to R hemiparesis.) Required Braces or Orthoses:  (wants her dtr to bring her shoes and R LE /foot brace. ) Restrictions Weight Bearing Restrictions: No   Pertinent Vitals/Pain Pt with no c/o of pain      Mobility  Bed Mobility Bed Mobility: Supine to Sit;Sit to Supine Supine to Sit: 3: Mod assist;With rails;HOB elevated Sitting - Scoot to Edge of Bed: 3: Mod assist;With rail Sit to Supine: 4: Min assist (but ModA to scoot to head of bed. ) Details for Bed Mobility Assistance: movment as she is used to using her hospital bed to help her adjust , synergistic patterns and shifting, but required min/mod A to steady and scoot.  Transfers Transfers: Not assessed (pt requested to wait until her dtr brought brace and shoes) Transfer via Lift Equipment: Maximove (recommend nursing use lift for OOB activities) Ambulation/Gait Ambulation/Gait Assistance: Other (comment)    Exercises Other Exercises Other Exercises: Sat EOB for 20 minutes assessing balance, tolerance for upright while brusing teeth, face washing, and small sips of water.  Lots of coughing with all of these activities, however tolerated well.    PT Diagnosis: Difficulty walking;Generalized weakness;Abnormality of gait;Hemiplegia dominant side  PT Problem List: Decreased strength;Decreased activity tolerance;Decreased balance;Decreased mobility PT Treatment Interventions: DME instruction;Gait training;Functional mobility training;Therapeutic activities;Therapeutic exercise;Balance training;Neuromuscular re-education   PT Goals Acute Rehab PT Goals PT Goal Formulation: With patient Time For Goal Achievement: 07/03/12 Potential to Achieve Goals: Good Pt will go Supine/Side to Sit: with modified independence PT Goal: Supine/Side to Sit - Progress: Goal set today Pt will go Sit to Supine/Side: with modified independence PT Goal: Sit to Supine/Side - Progress: Goal set today Pt will go Sit to Stand: with min assist PT Goal: Sit to Stand - Progress: Goal set today Pt will go Stand to Sit: with min assist PT Goal: Stand to Sit - Progress: Goal set today Pt will Transfer Bed to Chair/Chair to Bed: with min assist PT Transfer Goal: Bed to Chair/Chair to Bed - Progress: Goal set today Pt will Stand: with min assist;3 - 5 min;with unilateral upper extremity support PT Goal: Stand - Progress: Goal set today  Visit Information  Last PT Received On: 06/19/12 Assistance Needed: +2    Subjective Data  Subjective: Pt thankful for time spent and sitting up.  Patient Stated Goal: to haver her dtr bring her shoes (and R brace) so she can stand and work on walking. ( I have attempted to call [name] [name] as requested by pt with number in the chart [phone], however no sucess of  getting through to her.    Prior Functioning  Home Living Lives With: Alone Available Help at Discharge:  (unsure?) Type of Home: Apartment Home Access: Level entry Home Layout: One level Bathroom Toilet: Standard Bathroom Accessibility: Yes How Accessible: Accessible via wheelchair Home Adaptive  Equipment: Wheelchair - manual;Wheelchair - powered;Quad cane;Hand-held shower hose;Hospital bed (per patient) Prior Function Level of Independence: Independent with assistive device(s);Needs assistance Comments: pt reports she was able to stand and tranfer to power WC and to toilet independently in her home.  Prrimarily used the power WC but was working with Pt on improving standing,. transfers and ambulation with quad cane.  Communication Communication:  (understands all, just requires writing for her responses. )    Cognition  Cognition Arousal/Alertness: Awake/alert Behavior During Therapy: WFL for tasks assessed/performed Overall Cognitive Status: Within Functional Limits for tasks assessed    Extremity/Trunk Assessment Right Lower Extremity Assessment RLE ROM/Strength/Tone: Deficits RLE ROM/Strength/Tone Deficits: 2=/5 for knee ext, and flexion with inability to isolate movment patterns, in synergistic patterns. 0/5 for ankle movement Left Lower Extremity Assessment LLE ROM/Strength/Tone: WFL for tasks assessed (3=/5 for knee ext and hip flexion in siitting) LLE Sensation: WFL - Light Touch Trunk Assessment Trunk Assessment: Other exceptions Trunk Exceptions: pt able to sit upright in static sitting with slight left lean due to R trunk weakness. with shifting out of BOS , pt unsteady adn had to support and right with use of UEs and or minA.    Balance Balance Balance Assessed: Yes Static Sitting Balance Static Sitting - Balance Support: Feet supported;Left upper extremity supported  End of Session PT - End of Session Patient left: in bed  GP     [name] [name], [facility] 06/19/2012, 11:15 AM Marella Bile, PT Pager: [phone] 06/19/2012

## 2012-06-19 NOTE — Plan of Care (Signed)
Problem: Consults Goal: Pneumonia Patient Education See Patient Educatio Module for education specifics.  Outcome: Not Met (add Reason) Communication barrier

## 2012-06-19 NOTE — Progress Notes (Signed)
Attempted to meet with Pt.  Pt unable to remain awake to participate in assessment.  Pt did write on her paper that she would like for me to call her daughter, Elease Hashimoto.  CSW thanked Pt for her time.  CSW contacted Alisha at the number listed for her on Pt's facesheet.  A gentleman provided CSW with a cell number for Alisha: [phone].  Attempted to contact Alisha at the number listed above.  Automated message from [employer] stating, "The wireless customer you called is not available."  Unable to leave a message.  [name], LCSWA Clinical Social Work [phone]

## 2012-06-19 NOTE — Progress Notes (Signed)
Patient had difficulty taking fluids today despite ordered precautions in place. She had two episodes of coughing. Once after attempting jello and once after drinking juice. Staff at the bedside. We will hold off on liquid intake at this time.

## 2012-06-19 NOTE — Consult Note (Signed)
Patient [name] [name]      DOB: 11/06/1940      VWU:[id number]     Consult Note from the Palliative Medicine Team at [facility]    Consult Requested by: Dr Thedore Mins     PCP: Ralene Ok, MD Reason for Consultation: Clarification of GOC and options     Phone Number:[phone]  Assessment of patients Current state: 72 yo [place] [address] [place] with cough, weakness,fever.  Multiple co morbidities; CVA/dysphagia, PVD, DM, GERD, CAD/MI.  Poor functional status   Consult is for review of medical treatment options, clarification of goals of care and end of life issues, disposition and options, and symptom recommendation.  This NP [name] reviewed medical records, received report from team, assessed the patient and then meet at the patient's bedside along with *her daughters Andree Coss [phone] and Tiyona Desouza # [phone] discuss diagnosis prognosis, GOC, EOL wishes disposition and options.   A detailed discussion was had today regarding advanced directives.  Concepts specific to code status, artifical feeding and hydration, continued IV antibiotics and rehospitalization was had.  The difference between a aggressive medical intervention path  and a palliative comfort care path for this patient at this time was had.  Values and goals of care important to patient and family were attempted to be elicited.  Concept of Hospice and Palliative Care were discussed  Natural trajectory and expectations at EOL were discussed.  Questions and concerns addressed.  Hard Choices booklet left for review. Family encouraged to call with questions or concerns.  PMT will continue to support holistically.   Goals of Care: 1.  Code Status:Full code   2. Scope of Treatment: 1. Vital Signs:*per unit 2. Respiratory/Oxygen:as needed for tx 3. Nutritional Support/Tube Feeds:no artificial feeding at this time, all understand there high risk of aspiration.  Hopeful that  speech therapy at SNF will improve her swallow.  Open to possibility of PEG in future 4. Antibiotics:as needed for tx 5. IVF:as needed for tx 6. Review of Medications to be discontinued:continue presetn medciations 7. Labs:yes 8. Telemetry:as needed for tx 9. Consult: SW for SNF placement for rehab   4. Disposition: Hopeful for dc to SNF for rehabilition   3. Symptom Management:   1. Dysphagia:  Education offered to high risk of aspiration with oral intake and on secretions alone.  Patient and family wish to continue with present diet and hope that swallow will improve.   2.  Weakness: continue with PT and hope to dc to snf for rehab  4. Psychosocial:  Emotional support offered to patient and family, they are facing difficult choices and decsions    Patient Documents Completed or Given: Document Given Completed  Advanced Directives Pkt    MOST    DNR    Gone from My Sight    Hard Choices  yes    Brief HPI:  72 yo female [place] with cough, weakness,fever.  Multiple co morbidities; CVA/dysphagia, PVD, DM, GERD, CAD/MI.  Poor functional status     ROS: weakness, difficulty swallowing     PMH:  Past Medical History  Diagnosis Date  . 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 Communicates by writing   . Hypertension   . Hypercholesteremia   . Complication of anesthesia     lung   . Angina   . GERD (gastroesophageal reflux disease)   . Myocardial infarction     CABG 2001, ?  stent. Cath 02/2011 with new distal LIMA-LAD 80%. SVG-OM occlusion is old, rest of grafts patent  . Peripheral vascular disease   . Diabetes mellitus   . H/O hiatal hernia      PSH: Past Surgical History  Procedure Laterality Date  . Coronary artery bypass graft    . Abdominal hysterectomy    . Breast biopsy    . Esophagogastroduodenoscopy  03/12/2011    Procedure: ESOPHAGOGASTRODUODENOSCOPY (EGD);  Surgeon: Theda Belfast,  MD;  Location: [facility] ENDOSCOPY;  Service: Endoscopy;  Laterality: N/A;  . Cardiac catheterization  02/2011   I have reviewed the FH and SH and  If appropriate update it with new information. Allergies  Allergen Reactions  . Penicillins Swelling    Swelling of mouth   Scheduled Meds: . antiseptic oral rinse  15 mL Mouth Rinse BID  . aspirin  81 mg Oral Daily  . aztreonam  2 g Intravenous Q8H  . clopidogrel  75 mg Oral Daily  . enoxaparin (LOVENOX) injection  40 mg Subcutaneous Q24H  . escitalopram  20 mg Oral Daily  . insulin aspart  0-15 Units Subcutaneous Q6H  . insulin aspart  0-5 Units Subcutaneous QHS  . insulin glargine  30 Units Subcutaneous QHS  . metoprolol tartrate  25 mg Oral BID  . pantoprazole (PROTONIX) IV  40 mg Intravenous Q12H  . vancomycin  750 mg Intravenous Q12H   Continuous Infusions: . 0.9 % NaCl with KCl 20 mEq / L 50 mL/hr at 06/18/12 1035   PRN Meds:.acetaminophen, metoprolol    BP 123/52  Pulse 71  Temp(Src) 99.1 F (37.3 C) (Oral)  Resp 18  Ht 5\' 5"  (1.651 m)  Wt 78.109 kg (172 lb 3.2 oz)  BMI 28.66 kg/m2  SpO2 100%   PPS:30 % at best   Intake/Output Summary (Last 24 hours) at 06/19/12 0713 Last data filed at 06/18/12 1840  Gross per 24 hour  Intake 633.33 ml  Output      0 ml  Net 633.33 ml     Physical Exam:  General: chronically ill appearing, NAD, sitting up in bed HEENT:  Moist mucosus membranes, unable to handle secretions, increased drooling  Chest:   diminished in bases, CTA CVS: RRR GU: indwelling cath, dc now that focus of care has shifted to  Abdomen:soft NT +BS Ext: without edema, with muscle atrophy Neuro: aphasic, alert and oriented X3, communicates by writng and few audible resposnses  Labs: CBC    Component Value Date/Time   WBC 11.3* 06/17/2012 0649   RBC 4.06 06/17/2012 0649   HGB 10.7* 06/17/2012 0649   HCT 33.2* 06/17/2012 0649   PLT PLATELET CLUMPS NOTED ON SMEAR, COUNT APPEARS ADEQUATE 06/17/2012 0649   MCV  81.8 06/17/2012 0649   MCH 26.4 06/17/2012 0649   MCHC 32.2 06/17/2012 0649   RDW 13.3 06/17/2012 0649   LYMPHSABS 1.5 06/17/2012 0649   MONOABS 1.5* 06/17/2012 0649   EOSABS 0.0 06/17/2012 0649   BASOSABS 0.0 06/17/2012 0649    BMET    Component Value Date/Time   NA 135 06/17/2012 0649   K 3.4* 06/17/2012 0649   CL 100 06/17/2012 0649   CO2 25 06/17/2012 0649   GLUCOSE 326* 06/17/2012 0649   BUN 13 06/17/2012 0649   CREATININE 0.92 06/17/2012 0649   CALCIUM 8.9 06/17/2012 0649   GFRNONAA 61* 06/17/2012 0649   GFRAA 71* 06/17/2012 0649    CMP     Component Value Date/Time   NA 135  06/17/2012 0649   K 3.4* 06/17/2012 0649   CL 100 06/17/2012 0649   CO2 25 06/17/2012 0649   GLUCOSE 326* 06/17/2012 0649   BUN 13 06/17/2012 0649   CREATININE 0.92 06/17/2012 0649   CALCIUM 8.9 06/17/2012 0649   PROT 6.8 06/17/2012 0649   ALBUMIN 2.4* 06/17/2012 0649   AST 25 06/17/2012 0649   ALT 25 06/17/2012 0649   ALKPHOS 60 06/17/2012 0649   BILITOT 0.4 06/17/2012 0649   GFRNONAA 61* 06/17/2012 0649   GFRAA 71* 06/17/2012 0649      Time In Time Out Total Time Spent with Patient Total Overall Time  1300 1430 80 min 90 min    Greater than 50%  of this time was spent counseling and coordinating care related to the above assessment and plan.  [name] NP  Palliative Medicine Team Team Phone # [phone] Pager [phone]  Discussed with Dr Thedore Mins

## 2012-06-19 NOTE — Progress Notes (Signed)
This NP has a schedule meeting for tomorrow at 100 with two daughters to discuss GOC and options  [name] NP  Palliative Medicine Team Team Phone # [phone] Pager [phone]

## 2012-06-19 NOTE — Progress Notes (Signed)
[facility]                                                                                Patient Demographics  [name] [name], is a 72 y.o. female, DOB - May 04, 1940, ZOX:[id number], WJX:[id number]  Admit date - 06/16/2012  Admitting Physician Therisa Doyne, MD  Outpatient Primary MD for the patient is Ralene Ok, MD  LOS - 3   Chief Complaint  Patient presents with  . Cough        Assessment & Plan    . Healthcare-associated aspiration pneumonia - due to previous CVA and dysphagia, made her n.p.o. except for medications with sips and assistance, aspiration precautions and feeding assistance with medications with sips of water, minimize oral medications, continue aztreonam, vancomycin , Levaquin has been stopped, clinically has responded. As needed oxygen and nebulizer treatments. Patient has been evaluated by speech therapist to know the patient from before, they recommend continuing n.p.o. status, patient does not want feeding tubes, and she understands her prognosis is poor without oral diet, she wishes to talk to palliative care , they have been consulted, family meeting today early tomorrow. Patient wishes to go home with home hospice.        Marland Kitchen HTN (hypertension) - continue home medications currently stable      . Diabetes mellitus -we'll increased Lantus to 30 units subcutaneous. The new sliding scale and monitor   Lab Results  Component Value Date   HGBA1C 7.5* 06/17/2012    CBG (last 3)   Recent Labs  06/19/12 0012 06/19/12 0115 06/19/12 0546  GLUCAP 142* 157* 150*      Chest pain - atypical and currently resolved, EKG nonspecific changes, troponin negative x2 sets, continue to monitor, place on low-dose aspirin along with low-dose beta blocker, statin to be continued. Telemetry monitoring.     Code Status: Full  Family Communication: None present  Disposition Plan: SNF   Procedures swallow eval, chest x-ray   Consults  speech  therapy, palliative care   DVT Prophylaxis  Lovenox   Lab Results  Component Value Date   PLT 212 06/19/2012    Medications  Scheduled Meds: . antiseptic oral rinse  15 mL Mouth Rinse BID  . aspirin  81 mg Oral Daily  . aztreonam  2 g Intravenous Q8H  . clopidogrel  75 mg Oral Daily  . enoxaparin (LOVENOX) injection  40 mg Subcutaneous Q24H  . escitalopram  20 mg Oral Daily  . insulin aspart  0-15 Units Subcutaneous Q6H  . insulin aspart  0-5 Units Subcutaneous QHS  . insulin glargine  30 Units Subcutaneous QHS  . metoprolol tartrate  25 mg Oral BID  . pantoprazole (PROTONIX) IV  40 mg Intravenous Q12H  . vancomycin  750 mg Intravenous Q12H   Continuous Infusions:   PRN Meds:.acetaminophen, metoprolol  Antibiotics    Anti-infectives   Start     Dose/Rate Route Frequency Ordered Stop   06/17/12 0200  aztreonam (AZACTAM) 2 g in dextrose 5 % 50 mL IVPB     2 g 100 mL/hr over 30 Minutes Intravenous Every 8 hours 06/17/12 0035 06/25/12 0159   06/17/12 0200  vancomycin (VANCOCIN) IVPB 750  mg/150 ml premix     750 mg 150 mL/hr over 60 Minutes Intravenous Every 12 hours 06/17/12 0045 06/25/12 0159   06/17/12 0115  levofloxacin (LEVAQUIN) IVPB 750 mg  Status:  Discontinued     750 mg 100 mL/hr over 90 Minutes Intravenous Daily at bedtime 06/17/12 0035 06/17/12 0926       Time Spent in minutes  35   Yisel Megill K M.D on 06/19/2012 at 11:07 AM  Between 7am to 7pm - Pager - [phone]  After 7pm go to [email or web address] - password [email or web address]  And look for the night coverage person covering for me after hours  [facility] Office  [phone]    Subjective:   [name] [name] today remains aphasia but will communicate through had not and writing using her left hand, No headache, No chest pain, No abdominal pain - No Nausea, No new weakness tingling or numbness, she has cough and shortness of breath, coughs when she eats.  Objective:   Filed Vitals:   06/18/12  1711 06/18/12 2147 06/18/12 2259 06/19/12 0546  BP:  114/70 108/42 123/52  Pulse:  63 65 71  Temp: 99.4 F (37.4 C) 99.1 F (37.3 C)  99.1 F (37.3 C)  TempSrc: Oral Oral  Oral  Resp:  20  18  Height:      Weight:      SpO2:  100%  100%    Wt Readings from Last 3 Encounters:  06/17/12 78.109 kg (172 lb 3.2 oz)  05/05/12 81.965 kg (180 lb 11.2 oz)  07/24/11 81.8 kg (180 lb 5.4 oz)     Intake/Output Summary (Last 24 hours) at 06/19/12 1107 Last data filed at 06/19/12 0600  Gross per 24 hour  Intake 1720.83 ml  Output      0 ml  Net 1720.83 ml    Exam Awake Alert, Oriented X 3, No new F.N deficits, Normal affect, chronic right-sided weakness from previous CVA and is aphasic [place].AT,PERRAL Supple Neck,No JVD, No cervical lymphadenopathy appriciated.  Symmetrical Chest wall movement, right upper lobe rales RRR,No Gallops,Rubs or new Murmurs, No Parasternal Heave +ve B.Sounds, Abd Soft, Non tender, No organomegaly appriciated, No rebound - guarding or rigidity. No Cyanosis, Clubbing or edema, No new Rash or bruise   Data Review   Micro Results Recent Results (from the past 240 hour(s))  CULTURE, BLOOD (ROUTINE X 2)     Status: None   Collection Time    06/16/12  8:40 PM      Result Value Range Status   Specimen Description BLOOD LEFT ARM   Final   Special Requests BOTTLES DRAWN AEROBIC AND ANAEROBIC   Final   Culture  Setup Time 06/17/2012 06:59   Final   Culture     Final   Value:        BLOOD CULTURE RECEIVED NO GROWTH TO DATE CULTURE WILL BE HELD FOR 5 DAYS BEFORE ISSUING A FINAL NEGATIVE REPORT   Report Status PENDING   Incomplete  CULTURE, BLOOD (ROUTINE X 2)     Status: None   Collection Time    06/16/12  8:45 PM      Result Value Range Status   Specimen Description BLOOD LEFT ARM   Final   Special Requests BOTTLES DRAWN AEROBIC AND ANAEROBIC   Final   Culture  Setup Time 06/17/2012 06:58   Final   Culture     Final   Value:        BLOOD CULTURE  RECEIVED NO GROWTH TO DATE CULTURE WILL BE HELD FOR 5 DAYS BEFORE ISSUING A FINAL NEGATIVE REPORT   Report Status PENDING   Incomplete  URINE CULTURE     Status: None   Collection Time    06/16/12  9:40 PM      Result Value Range Status   Specimen Description URINE, CATHETERIZED   Final   Special Requests NONE   Final   Culture  Setup Time 06/17/2012 07:11   Final   Colony Count NO GROWTH   Final   Culture NO GROWTH   Final   Report Status 06/18/2012 FINAL   Final    Radiology Reports Dg Chest 2 View  06/16/2012  *RADIOLOGY REPORT*  Clinical Data: 72 year old female shortness of breath and weakness.  CHEST - 2 VIEW  Comparison: 05/04/2012.  Findings: Semi upright AP and lateral views of the chest.  New right upper lobe consolidation, widespread.  Bordering the major and minor fissures.  Stable lung volumes.  Sequelae of CABG.  Left lung is stable and clear. Nonobstructed bowel gas pattern, no free air.  Visualized tracheal air column is within normal limits.  Mild compression fracture lower thoracic spine age indeterminate.  IMPRESSION: Extensive right upper lobe pneumonia. Recommend post-treatment radiographs to document resolution.   Original Report Authenticated By: Erskine Speed, M.D.    Dg Chest Port 1 View  06/17/2012  *RADIOLOGY REPORT*  Clinical Data: Cough and shortness of breath  PORTABLE CHEST - 1 VIEW  Comparison:  June 16, 2012  Findings:  Extensive right upper lobe consolidation appears stable. Lungs elsewhere clear.  Heart size and pulmonary vascularity normal.  The patient is status post coronary artery bypass grafting.  Aorta is prominent with atherosclerotic change, stable. No adenopathy.  IMPRESSION: Persistent extensive right upper lobe consolidation. No new opacity. There is thoracic aortic prominence, a finding probably indicative of chronic hypertensive change.   Original Report Authenticated By: Bretta Bang, M.D.     CBC  Recent Labs Lab 06/16/12 2045  06/17/12 0649 06/19/12 0716  WBC 16.1* 11.3* 12.6*  HGB 11.7* 10.7* 10.7*  HCT 36.0 33.2* 32.6*  PLT 211 PLATELET CLUMPS NOTED ON SMEAR, COUNT APPEARS ADEQUATE 212  MCV 81.3 81.8 80.5  MCH 26.4 26.4 26.4  MCHC 32.5 32.2 32.8  RDW 13.1 13.3 13.6  LYMPHSABS 1.9 1.5  --   MONOABS 2.2* 1.5*  --   EOSABS 0.0 0.0  --   BASOSABS 0.1 0.0  --     Chemistries   Recent Labs Lab 06/16/12 2045 06/17/12 0649 06/19/12 0716  NA 133* 135 138  K 3.5 3.4* 4.0  CL 96 100 104  CO2 23 25 23   GLUCOSE 369* 326* 153*  BUN 14 13 10   CREATININE 0.97 0.92 0.89  CALCIUM 9.6 8.9 9.0  AST 27 25  --   ALT 27 25  --   ALKPHOS 72 60  --   BILITOT 0.4 0.4  --    ------------------------------------------------------------------------------------------------------------------ estimated creatinine clearance is 59.9 ml/min (by C-G formula based on Cr of 0.89). ------------------------------------------------------------------------------------------------------------------  Recent Labs  06/17/12 0050  HGBA1C 7.5*   ------------------------------------------------------------------------------------------------------------------ No results found for this basename: CHOL, HDL, LDLCALC, TRIG, CHOLHDL, LDLDIRECT,  in the last 72 hours ------------------------------------------------------------------------------------------------------------------ No results found for this basename: TSH, T4TOTAL, FREET3, T3FREE, THYROIDAB,  in the last 72 hours ------------------------------------------------------------------------------------------------------------------ No results found for this basename: VITAMINB12, FOLATE, FERRITIN, TIBC, IRON, RETICCTPCT,  in the last 72 hours  Coagulation profile No results found  for this basename: INR, PROTIME,  in the last 168 hours  No results found for this basename: DDIMER,  in the last 72 hours  Cardiac Enzymes  Recent Labs Lab 06/17/12 0050 06/17/12 0649  06/17/12 1300  TROPONINI <0.30 <0.30 <0.30   ------------------------------------------------------------------------------------------------------------------ No components found with this basename: POCBNP,

## 2012-06-20 DIAGNOSIS — Z515 Encounter for palliative care: Secondary | ICD-10-CM

## 2012-06-20 DIAGNOSIS — R5381 Other malaise: Secondary | ICD-10-CM

## 2012-06-20 LAB — STREP PNEUMONIAE URINARY ANTIGEN: Strep Pneumo Urinary Antigen: NEGATIVE

## 2012-06-20 LAB — GLUCOSE, CAPILLARY
Glucose-Capillary: 107 mg/dL — ABNORMAL HIGH (ref 70–99)
Glucose-Capillary: 189 mg/dL — ABNORMAL HIGH (ref 70–99)

## 2012-06-20 MED ORDER — INSULIN GLARGINE 100 UNIT/ML ~~LOC~~ SOLN
20.0000 [IU] | Freq: Every day | SUBCUTANEOUS | Status: DC
  Administered 2012-06-20 – 2012-06-22 (×3): 20 [IU] via SUBCUTANEOUS
  Filled 2012-06-20 (×4): qty 0.2

## 2012-06-20 NOTE — Progress Notes (Signed)
ANTIBIOTIC CONSULT NOTE - INITIAL  Pharmacy Consult for Vanco and Aztreonam Indication: HCAP  Allergies  Allergen Reactions  . Penicillins Swelling    Swelling of mouth    Patient Measurements: Height: 5\' 5"  (165.1 cm) Weight: 172 lb 3.2 oz (78.109 kg) IBW/kg (Calculated) : 57  Vital Signs: Temp: 99.5 F (37.5 C) (04/27 0546) Temp src: Oral (04/27 0546) BP: 135/66 mmHg (04/27 0943) Pulse Rate: 84 (04/27 0943)  Labs:  Recent Labs  06/19/12 0716  WBC 12.6*  HGB 10.7*  PLT 212  CREATININE 0.89   Estimated Creatinine Clearance: 59.9 ml/min (by C-G formula based on Cr of 0.89).    Medical History: Past Medical History  Diagnosis Date  . 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 Communicates by writing   . Hypertension   . Hypercholesteremia   . Complication of anesthesia     lung   . Angina   . GERD (gastroesophageal reflux disease)   . Myocardial infarction     CABG 2001, ? stent. Cath 02/2011 with new distal LIMA-LAD 80%. SVG-OM occlusion is old, rest of grafts patent  . Peripheral vascular disease   . Diabetes mellitus   . H/O hiatal hernia     Medications:  Anti-infectives   Start     Dose/Rate Route Frequency Ordered Stop   06/17/12 0200  aztreonam (AZACTAM) 2 g in dextrose 5 % 50 mL IVPB     2 g 100 mL/hr over 30 Minutes Intravenous Every 8 hours 06/17/12 0035 06/25/12 0159   06/17/12 0200  vancomycin (VANCOCIN) IVPB 750 mg/150 ml premix     750 mg 150 mL/hr over 60 Minutes Intravenous Every 12 hours 06/17/12 0045 06/25/12 0159   06/17/12 0115  levofloxacin (LEVAQUIN) IVPB 750 mg  Status:  Discontinued     750 mg 100 mL/hr over 90 Minutes Intravenous Daily at bedtime 06/17/12 0035 06/17/12 0926     Assessment: 72 yo F with recent hospitalization admitted 4/23 with HCAP / aspiration PNA. Pt w/PCN allergy  D#4 Aztreonam 2g q8h and Vanco 750mg  IV q12h   TM 101, WBC 12.6  Scr wnl, stable. CrCl 30ml/min  Blood and urine cx  4/23 negative  Goal of Therapy:  Vancomycin trough level 15-20 mcg/ml  Plan:   No changes.  Will defer VT for now until after GOC   Continue to monitor  Gwen Her PharmD  [phone] 06/20/2012 3:08 PM

## 2012-06-20 NOTE — Progress Notes (Signed)
CSW spoke with [name], NP, who stated that Pt and family wish to pursue SNF.  Weekday CSW to f/u.  [name], LCSWA Clinical Social Work [phone]

## 2012-06-20 NOTE — Progress Notes (Signed)
Patient did not want foley taken out, notified Tom Callahan-NP, and he stated is okay to leave foley in until in AM.

## 2012-06-20 NOTE — Progress Notes (Signed)
Attempted to reach [facility].  Spoke with another gentleman and was provided a different cell number for [name] [name]: [phone].  Message at this number stated that this person's voicemail has not been set up.  Attempted to contact [name], [name] [name], at the number listed on Pt's facesheet.  Informed by an automated message that the person is unavailable.  Unable to leave a message.  Chart review.  Noted that a Goals of Care meeting was scheduled for today at 1300.  No notes re: this meeting in the chart, at this time.  [name], LCSWA Clinical Social Work [phone]

## 2012-06-20 NOTE — Progress Notes (Signed)
[facility]                                                                                Patient Demographics  [name] [name], is a 72 y.o. female, DOB - 12-18-1940, ZOX:[id number], WJX:[id number]  Admit date - 06/16/2012  Admitting Physician Therisa Doyne, MD  Outpatient Primary MD for the patient is Ralene Ok, MD  LOS - 4   Chief Complaint  Patient presents with  . Cough        Assessment & Plan    . Healthcare-associated aspiration pneumonia - due to previous CVA and dysphagia, made her n.p.o. except for medications with sips and assistance, aspiration precautions and feeding assistance with medications with sips of water, minimize oral medications, continue aztreonam, vancomycin , Levaquin has been stopped, clinically has responded. As needed oxygen and nebulizer treatments. Patient has been evaluated by speech therapist to know the patient from before, they recommend continuing n.p.o. status, patient does not want feeding tubes, and she understands her prognosis is poor without oral diet, she wishes to talk to palliative care and have comfort feeds , Pall care have been consulted, family meeting today. Patient wishes to go home with home hospice. Once decided will switch to PO meds and ABX if needed.       Marland Kitchen HTN (hypertension) - continue home medications currently stable      . Diabetes mellitus - reduced Lantus, as PO intake minimal. Contionue sliding scale and monitor   Lab Results  Component Value Date   HGBA1C 7.5* 06/17/2012    CBG (last 3)   Recent Labs  06/19/12 1726 06/19/12 2341 06/20/12 0601  GLUCAP 229* 135* 107*      Chest pain - atypical and currently resolved, EKG nonspecific changes, troponin negative x2 sets, continue to monitor, place on low-dose aspirin along with low-dose beta blocker, statin to be continued. Telemetry monitoring.     Code Status: Full  Family Communication: None present  Disposition Plan:  SNF   Procedures swallow eval, chest x-ray   Consults  speech therapy, palliative care   DVT Prophylaxis  Lovenox   Lab Results  Component Value Date   PLT 212 06/19/2012    Medications  Scheduled Meds: . antiseptic oral rinse  15 mL Mouth Rinse BID  . aspirin  81 mg Oral Daily  . aztreonam  2 g Intravenous Q8H  . clopidogrel  75 mg Oral Daily  . enoxaparin (LOVENOX) injection  40 mg Subcutaneous Q24H  . escitalopram  20 mg Oral Daily  . insulin aspart  0-15 Units Subcutaneous Q6H  . insulin aspart  0-5 Units Subcutaneous QHS  . insulin glargine  30 Units Subcutaneous QHS  . metoprolol tartrate  25 mg Oral BID  . pantoprazole (PROTONIX) IV  40 mg Intravenous Q12H  . vancomycin  750 mg Intravenous Q12H   Continuous Infusions:   PRN Meds:.acetaminophen, metoprolol  Antibiotics    Anti-infectives   Start     Dose/Rate Route Frequency Ordered Stop   06/17/12 0200  aztreonam (AZACTAM) 2 g in dextrose 5 % 50 mL IVPB     2 g 100 mL/hr over 30 Minutes Intravenous Every 8 hours 06/17/12  9604 06/25/12 0159   06/17/12 0200  vancomycin (VANCOCIN) IVPB 750 mg/150 ml premix     750 mg 150 mL/hr over 60 Minutes Intravenous Every 12 hours 06/17/12 0045 06/25/12 0159   06/17/12 0115  levofloxacin (LEVAQUIN) IVPB 750 mg  Status:  Discontinued     750 mg 100 mL/hr over 90 Minutes Intravenous Daily at bedtime 06/17/12 0035 06/17/12 0926       Time Spent in minutes  35   SINGH,PRASHANT K M.D on 06/20/2012 at 8:07 AM  Between 7am to 7pm - Pager - [phone]  After 7pm go to [email or web address] - password [email or web address]  And look for the night coverage person covering for me after hours  [facility] Office  [phone]    Subjective:   Christabella Alvira today remains aphasia but will communicate through had not and writing using her left hand, No headache, No chest pain, No abdominal pain - No Nausea, No new weakness tingling or numbness, she has cough and shortness of breath,  coughs when she eats.  Objective:   Filed Vitals:   06/19/12 0546 06/19/12 1532 06/19/12 2056 06/20/12 0546  BP: 123/52 146/76 108/46 117/55  Pulse: 71 80 80 66  Temp: 99.1 F (37.3 C) 101 F (38.3 C) 100.5 F (38.1 C) 99.5 F (37.5 C)  TempSrc: Oral Oral Oral Oral  Resp: 18 28 22 20   Height:      Weight:      SpO2: 100% 100% 100% 100%    Wt Readings from Last 3 Encounters:  06/17/12 78.109 kg (172 lb 3.2 oz)  05/05/12 81.965 kg (180 lb 11.2 oz)  07/24/11 81.8 kg (180 lb 5.4 oz)     Intake/Output Summary (Last 24 hours) at 06/20/12 0807 Last data filed at 06/20/12 8657  Gross per 24 hour  Intake    320 ml  Output    950 ml  Net   -630 ml    Exam Awake Alert, Oriented X 3, No new F.N deficits, Normal affect, chronic right-sided weakness from previous CVA and is aphasic [place].AT,PERRAL Supple Neck,No JVD, No cervical lymphadenopathy appriciated.  Symmetrical Chest wall movement, right upper lobe rales RRR,No Gallops,Rubs or new Murmurs, No Parasternal Heave +ve B.Sounds, Abd Soft, Non tender, No organomegaly appriciated, No rebound - guarding or rigidity. No Cyanosis, Clubbing or edema, No new Rash or bruise   Data Review   Micro Results Recent Results (from the past 240 hour(s))  CULTURE, BLOOD (ROUTINE X 2)     Status: None   Collection Time    06/16/12  8:40 PM      Result Value Range Status   Specimen Description BLOOD LEFT ARM   Final   Special Requests BOTTLES DRAWN AEROBIC AND ANAEROBIC   Final   Culture  Setup Time 06/17/2012 06:59   Final   Culture     Final   Value:        BLOOD CULTURE RECEIVED NO GROWTH TO DATE CULTURE WILL BE HELD FOR 5 DAYS BEFORE ISSUING A FINAL NEGATIVE REPORT   Report Status PENDING   Incomplete  CULTURE, BLOOD (ROUTINE X 2)     Status: None   Collection Time    06/16/12  8:45 PM      Result Value Range Status   Specimen Description BLOOD LEFT ARM   Final   Special Requests BOTTLES DRAWN AEROBIC AND ANAEROBIC   Final    Culture  Setup Time 06/17/2012 06:58   Final  Culture     Final   Value:        BLOOD CULTURE RECEIVED NO GROWTH TO DATE CULTURE WILL BE HELD FOR 5 DAYS BEFORE ISSUING A FINAL NEGATIVE REPORT   Report Status PENDING   Incomplete  URINE CULTURE     Status: None   Collection Time    06/16/12  9:40 PM      Result Value Range Status   Specimen Description URINE, CATHETERIZED   Final   Special Requests NONE   Final   Culture  Setup Time 06/17/2012 07:11   Final   Colony Count NO GROWTH   Final   Culture NO GROWTH   Final   Report Status 06/18/2012 FINAL   Final    Radiology Reports Dg Chest 2 View  06/16/2012  *RADIOLOGY REPORT*  Clinical Data: 72 year old female shortness of breath and weakness.  CHEST - 2 VIEW  Comparison: 05/04/2012.  Findings: Semi upright AP and lateral views of the chest.  New right upper lobe consolidation, widespread.  Bordering the major and minor fissures.  Stable lung volumes.  Sequelae of CABG.  Left lung is stable and clear. Nonobstructed bowel gas pattern, no free air.  Visualized tracheal air column is within normal limits.  Mild compression fracture lower thoracic spine age indeterminate.  IMPRESSION: Extensive right upper lobe pneumonia. Recommend post-treatment radiographs to document resolution.   Original Report Authenticated By: Erskine Speed, M.D.    Dg Chest Port 1 View  06/17/2012  *RADIOLOGY REPORT*  Clinical Data: Cough and shortness of breath  PORTABLE CHEST - 1 VIEW  Comparison:  June 16, 2012  Findings:  Extensive right upper lobe consolidation appears stable. Lungs elsewhere clear.  Heart size and pulmonary vascularity normal.  The patient is status post coronary artery bypass grafting.  Aorta is prominent with atherosclerotic change, stable. No adenopathy.  IMPRESSION: Persistent extensive right upper lobe consolidation. No new opacity. There is thoracic aortic prominence, a finding probably indicative of chronic hypertensive change.   Original  Report Authenticated By: Bretta Bang, M.D.     CBC  Recent Labs Lab 06/16/12 2045 06/17/12 0649 06/19/12 0716  WBC 16.1* 11.3* 12.6*  HGB 11.7* 10.7* 10.7*  HCT 36.0 33.2* 32.6*  PLT 211 PLATELET CLUMPS NOTED ON SMEAR, COUNT APPEARS ADEQUATE 212  MCV 81.3 81.8 80.5  MCH 26.4 26.4 26.4  MCHC 32.5 32.2 32.8  RDW 13.1 13.3 13.6  LYMPHSABS 1.9 1.5  --   MONOABS 2.2* 1.5*  --   EOSABS 0.0 0.0  --   BASOSABS 0.1 0.0  --     Chemistries   Recent Labs Lab 06/16/12 2045 06/17/12 0649 06/19/12 0716  NA 133* 135 138  K 3.5 3.4* 4.0  CL 96 100 104  CO2 23 25 23   GLUCOSE 369* 326* 153*  BUN 14 13 10   CREATININE 0.97 0.92 0.89  CALCIUM 9.6 8.9 9.0  AST 27 25  --   ALT 27 25  --   ALKPHOS 72 60  --   BILITOT 0.4 0.4  --    ------------------------------------------------------------------------------------------------------------------ estimated creatinine clearance is 59.9 ml/min (by C-G formula based on Cr of 0.89). ------------------------------------------------------------------------------------------------------------------ No results found for this basename: HGBA1C,  in the last 72 hours ------------------------------------------------------------------------------------------------------------------ No results found for this basename: CHOL, HDL, LDLCALC, TRIG, CHOLHDL, LDLDIRECT,  in the last 72 hours ------------------------------------------------------------------------------------------------------------------ No results found for this basename: TSH, T4TOTAL, FREET3, T3FREE, THYROIDAB,  in the last 72 hours ------------------------------------------------------------------------------------------------------------------ No results found for  this basename: VITAMINB12, FOLATE, FERRITIN, TIBC, IRON, RETICCTPCT,  in the last 72 hours  Coagulation profile No results found for this basename: INR, PROTIME,  in the last 168 hours  No results found for this  basename: DDIMER,  in the last 72 hours  Cardiac Enzymes  Recent Labs Lab 06/17/12 0050 06/17/12 0649 06/17/12 1300  TROPONINI <0.30 <0.30 <0.30   ------------------------------------------------------------------------------------------------------------------ No components found with this basename: POCBNP,

## 2012-06-21 ENCOUNTER — Inpatient Hospital Stay (HOSPITAL_COMMUNITY): Payer: Medicare HMO

## 2012-06-21 DIAGNOSIS — R1310 Dysphagia, unspecified: Secondary | ICD-10-CM

## 2012-06-21 LAB — BASIC METABOLIC PANEL
BUN: 8 mg/dL (ref 6–23)
Calcium: 9.2 mg/dL (ref 8.4–10.5)
Creatinine, Ser: 0.82 mg/dL (ref 0.50–1.10)
GFR calc non Af Amer: 70 mL/min — ABNORMAL LOW (ref >90)
Glucose, Bld: 166 mg/dL — ABNORMAL HIGH (ref 70–99)

## 2012-06-21 LAB — CBC
HCT: 33.4 % — ABNORMAL LOW (ref 36.0–46.0)
Hemoglobin: 11 g/dL — ABNORMAL LOW (ref 12.0–15.0)
MCH: 26.5 pg (ref 26.0–34.0)
MCHC: 32.9 g/dL (ref 30.0–36.0)
MCV: 80.5 fL (ref 78.0–100.0)
RDW: 13.5 % (ref 11.5–15.5)

## 2012-06-21 LAB — GLUCOSE, CAPILLARY: Glucose-Capillary: 244 mg/dL — ABNORMAL HIGH (ref 70–99)

## 2012-06-21 LAB — LEGIONELLA ANTIGEN, URINE: Legionella Antigen, Urine: NEGATIVE

## 2012-06-21 LAB — VANCOMYCIN, TROUGH: Vancomycin Tr: 8.1 ug/mL — ABNORMAL LOW (ref 10.0–20.0)

## 2012-06-21 MED ORDER — SODIUM CHLORIDE 0.9 % IV SOLN
1250.0000 mg | Freq: Two times a day (BID) | INTRAVENOUS | Status: DC
  Administered 2012-06-21 – 2012-06-23 (×4): 1250 mg via INTRAVENOUS
  Filled 2012-06-21 (×5): qty 1250

## 2012-06-21 MED ORDER — SODIUM CHLORIDE 0.9 % IV BOLUS (SEPSIS)
250.0000 mL | Freq: Once | INTRAVENOUS | Status: AC
  Administered 2012-06-22: 250 mL via INTRAVENOUS

## 2012-06-21 MED ORDER — ASPIRIN 300 MG RE SUPP: 300.0000 mg | Freq: Every day | RECTAL | Status: DC

## 2012-06-21 MED ORDER — POTASSIUM CHLORIDE 10 MEQ/100ML IV SOLN
10.0000 meq | INTRAVENOUS | Status: AC
  Administered 2012-06-21 (×5): 10 meq via INTRAVENOUS
  Filled 2012-06-21 (×4): qty 100

## 2012-06-21 MED ORDER — ASPIRIN 300 MG RE SUPP
150.0000 mg | Freq: Every day | RECTAL | Status: DC
  Administered 2012-06-21 – 2012-06-23 (×3): 150 mg via RECTAL
  Filled 2012-06-21 (×3): qty 1

## 2012-06-21 NOTE — Progress Notes (Signed)
MRI tech called and stated patient was not able to stay flat  in bed and having problem breathing while they are trying to do the MRI. Notified Dr. Thedore Mins and he stated to cancel odered MRI and bring patient back to her room, MRI tech notified as well.

## 2012-06-21 NOTE — Progress Notes (Addendum)
[facility]                                                                                Patient Demographics  [name] [name], is a 72 y.o. female, DOB - 11-04-1940, ZOX:[id number], WJX:[id number]  Admit date - 06/16/2012  Admitting Physician Therisa Doyne, MD  Outpatient Primary MD for the patient is Ralene Ok, MD  LOS - 5   Chief Complaint  Patient presents with  . 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 She has been noncompliant with her recommended diet at home per family as noted in the history and physical exam, she is being followed by home speech therapist on a regular basis at home.   She was admitted few days ago with aspiration pneumonia, patient again chose not to get a feeding tube and wanted her care to be directed towards comfort, palliative care was involved consultation was done on 06/20/2012 however at that time in the presence of family patient decided that she wanted to be full code, she also said that she would like to continue eating with the known risk of aspiration.  However she also informed the staff that her feeding was somewhat worse for the last 2 weeks, I question if she had another stroke about 2 weeks ago, she continues to be aphasic and come indicates mostly with head nods and writing from left hand, repeat stroke workup will be done on 06/21/2012, also palliative care we'll continue to follow the patient to see what her thoughts are on continued aspiration even with minimal diet, at this point she is also aspirating her oral secretions and continues not to want feeding tube.      Assessment and  plan  . Healthcare-associated aspiration pneumonia - due to previous CVA and dysphagia - continue empiric antibiotics, patient unsure about her CODE STATUS and feeding status, patient today tells [place] that although she had profound dysphagia from previous CVA for the last 2 weeks her dysphagia has gotten worse, I question if she had another stroke around 2 weeks ago, will repeat MRI MRA of the brain, will check echogram and carotids along with A1c and lipid panel. Aspirin rectal suppositories to be continued, speech therapy already following the patient closely, aspiration precautions, currently n.p.o. PT is already following the patient case management and social work already following a placement.   Lab Results  Component Value Date   LIPASE 30 05/04/2012   Lab Results  Component Value Date   HGBA1C 7.5* 06/17/2012         . HTN (hypertension) - continue home medications currently stable as tolerated orally     . Diabetes mellitus - reduced Lantus, as PO intake minimal. Contionue sliding scale and monitor   Lab Results  Component Value Date   HGBA1C 7.5*  06/17/2012    CBG (last 3)   Recent Labs  06/20/12 1202 06/20/12 1731 06/21/12 0013  GLUCAP 189* 203* 149*      Chest pain - atypical and currently resolved, EKG nonspecific changes, troponin negative x2 sets, continue to monitor, place on low-dose aspirin along with low-dose beta blocker, statin to be continued. Telemetry monitoring.     Code Status: Full  Family Communication: None present  Disposition Plan: SNF   Procedures swallow eval, chest x-ray, MRI MRA brain, echogram, carotid duplex   Consults  speech therapy, palliative care, social work and case management   DVT Prophylaxis  Lovenox   Lab Results  Component Value Date   PLT 261 06/21/2012    Medications  Scheduled Meds: . antiseptic oral rinse  15 mL Mouth Rinse BID  . aspirin  150 mg Rectal Daily  . aztreonam  2 g Intravenous Q8H  .  clopidogrel  75 mg Oral Daily  . enoxaparin (LOVENOX) injection  40 mg Subcutaneous Q24H  . escitalopram  20 mg Oral Daily  . insulin aspart  0-15 Units Subcutaneous Q6H  . insulin aspart  0-5 Units Subcutaneous QHS  . insulin glargine  20 Units Subcutaneous QHS  . metoprolol tartrate  25 mg Oral BID  . pantoprazole (PROTONIX) IV  40 mg Intravenous Q12H  . vancomycin  750 mg Intravenous Q12H   Continuous Infusions:   PRN Meds:.acetaminophen, metoprolol  Antibiotics    Anti-infectives   Start     Dose/Rate Route Frequency Ordered Stop   06/17/12 0200  aztreonam (AZACTAM) 2 g in dextrose 5 % 50 mL IVPB     2 g 100 mL/hr over 30 Minutes Intravenous Every 8 hours 06/17/12 0035 06/25/12 0159   06/17/12 0200  vancomycin (VANCOCIN) IVPB 750 mg/150 ml premix     750 mg 150 mL/hr over 60 Minutes Intravenous Every 12 hours 06/17/12 0045 06/25/12 0159   06/17/12 0115  levofloxacin (LEVAQUIN) IVPB 750 mg  Status:  Discontinued     750 mg 100 mL/hr over 90 Minutes Intravenous Daily at bedtime 06/17/12 0035 06/17/12 0926       Time Spent in minutes  35   SINGH,PRASHANT K M.D on 06/21/2012 at 10:38 AM  Between 7am to 7pm - Pager - [phone]  After 7pm go to [email or web address] - password [email or web address]  And look for the night coverage person covering for me after hours  [facility] Office  [phone]    Subjective:   [name] [name] today remains aphasia but will communicate through head nod and writing using her left hand, No headache, No chest pain, No abdominal pain - No Nausea, No new weakness tingling or numbness, she has cough and shortness of breath, coughs when she eats.  Objective:   Filed Vitals:   06/20/12 2326 06/21/12 0100 06/21/12 0218 06/21/12 0655  BP: 144/79  100/42 116/54  Pulse: 86  67 66  Temp: 101.8 F (38.8 C) 99.1 F (37.3 C) 98.8 F (37.1 C) 99 F (37.2 C)  TempSrc: Oral   Oral  Resp: 18  18 18   Height:      Weight:      SpO2: 100%  97% 100%     Wt Readings from Last 3 Encounters:  06/17/12 78.109 kg (172 lb 3.2 oz)  05/05/12 81.965 kg (180 lb 11.2 oz)  07/24/11 81.8 kg (180 lb 5.4 oz)     Intake/Output Summary (Last 24 hours) at 06/21/12 1038 Last data filed at 06/21/12 6440  Gross per 24 hour  Intake    880 ml  Output   1000 ml  Net   -120 ml    Exam Awake Alert, Oriented X 3, No new F.N deficits, Normal affect, chronic right-sided weakness from previous CVA and is aphasic .AT,PERRAL Supple Neck,No JVD, No cervical lymphadenopathy appriciated.  Symmetrical Chest wall movement, right upper lobe rales RRR,No Gallops,Rubs or new Murmurs, No Parasternal Heave +ve B.Sounds, Abd Soft, Non tender, No organomegaly appriciated, No rebound - guarding or rigidity. No Cyanosis, Clubbing or edema, No new Rash or bruise   Data Review   Micro Results Recent Results (from the past 240 hour(s))  CULTURE, BLOOD (ROUTINE X 2)     Status: None   Collection Time    06/16/12  8:40 PM      Result Value Range Status   Specimen Description BLOOD LEFT ARM   Final   Special Requests BOTTLES DRAWN AEROBIC AND ANAEROBIC   Final   Culture  Setup Time 06/17/2012 06:59   Final   Culture     Final   Value:        BLOOD CULTURE RECEIVED NO GROWTH TO DATE CULTURE WILL BE HELD FOR 5 DAYS BEFORE ISSUING A FINAL NEGATIVE REPORT   Report Status PENDING   Incomplete  CULTURE, BLOOD (ROUTINE X 2)     Status: None   Collection Time    06/16/12  8:45 PM      Result Value Range Status   Specimen Description BLOOD LEFT ARM   Final   Special Requests BOTTLES DRAWN AEROBIC AND ANAEROBIC   Final   Culture  Setup Time 06/17/2012 06:58   Final   Culture     Final   Value:        BLOOD CULTURE RECEIVED NO GROWTH TO DATE CULTURE WILL BE HELD FOR 5 DAYS BEFORE ISSUING A FINAL NEGATIVE REPORT   Report Status PENDING   Incomplete  URINE CULTURE     Status: None   Collection Time    06/16/12  9:40 PM      Result Value Range Status   Specimen  Description URINE, CATHETERIZED   Final   Special Requests NONE   Final   Culture  Setup Time 06/17/2012 07:11   Final   Colony Count NO GROWTH   Final   Culture NO GROWTH   Final   Report Status 06/18/2012 FINAL   Final    Radiology Reports Dg Chest 2 View  06/16/2012  *RADIOLOGY REPORT*  Clinical Data: 72 year old female shortness of breath and weakness.  CHEST - 2 VIEW  Comparison: 05/04/2012.  Findings: Semi upright AP and lateral views of the chest.  New right upper lobe consolidation, widespread.  Bordering the major and minor fissures.  Stable lung volumes.  Sequelae of CABG.  Left lung is stable and clear. Nonobstructed bowel gas pattern, no free air.  Visualized tracheal air column is within normal limits.  Mild compression fracture lower thoracic spine age indeterminate.  IMPRESSION: Extensive right upper lobe pneumonia. Recommend post-treatment radiographs to document resolution.   Original Report Authenticated By: Erskine Speed, M.D.    Dg Chest Port 1 View  06/17/2012  *RADIOLOGY REPORT*  Clinical Data: Cough and shortness of breath  PORTABLE CHEST - 1 VIEW  Comparison:  June 16, 2012  Findings:  Extensive right upper lobe consolidation appears stable. Lungs elsewhere clear.  Heart size and pulmonary vascularity normal.  The patient is status post coronary  artery bypass grafting.  Aorta is prominent with atherosclerotic change, stable. No adenopathy.  IMPRESSION: Persistent extensive right upper lobe consolidation. No new opacity. There is thoracic aortic prominence, a finding probably indicative of chronic hypertensive change.   Original Report Authenticated By: Bretta Bang, M.D.     CBC  Recent Labs Lab 06/16/12 2045 06/17/12 1027 06/19/12 0716 06/21/12 0658  WBC 16.1* 11.3* 12.6* 9.4  HGB 11.7* 10.7* 10.7* 11.0*  HCT 36.0 33.2* 32.6* 33.4*  PLT 211 PLATELET CLUMPS NOTED ON SMEAR, COUNT APPEARS ADEQUATE 212 261  MCV 81.3 81.8 80.5 80.5  MCH 26.4 26.4 26.4 26.5  MCHC  32.5 32.2 32.8 32.9  RDW 13.1 13.3 13.6 13.5  LYMPHSABS 1.9 1.5  --   --   MONOABS 2.2* 1.5*  --   --   EOSABS 0.0 0.0  --   --   BASOSABS 0.1 0.0  --   --     Chemistries   Recent Labs Lab 06/16/12 2045 06/17/12 0649 06/19/12 0716 06/21/12 0658  NA 133* 135 138 135  K 3.5 3.4* 4.0 3.2*  CL 96 100 104 100  CO2 23 25 23 27   GLUCOSE 369* 326* 153* 166*  BUN 14 13 10 8   CREATININE 0.97 0.92 0.89 0.82  CALCIUM 9.6 8.9 9.0 9.2  MG  --   --   --  2.0  AST 27 25  --   --   ALT 27 25  --   --   ALKPHOS 72 60  --   --   BILITOT 0.4 0.4  --   --    ------------------------------------------------------------------------------------------------------------------ estimated creatinine clearance is 65 ml/min (by C-G formula based on Cr of 0.82). ------------------------------------------------------------------------------------------------------------------ No results found for this basename: HGBA1C,  in the last 72 hours ------------------------------------------------------------------------------------------------------------------ No results found for this basename: CHOL, HDL, LDLCALC, TRIG, CHOLHDL, LDLDIRECT,  in the last 72 hours ------------------------------------------------------------------------------------------------------------------ No results found for this basename: TSH, T4TOTAL, FREET3, T3FREE, THYROIDAB,  in the last 72 hours ------------------------------------------------------------------------------------------------------------------ No results found for this basename: VITAMINB12, FOLATE, FERRITIN, TIBC, IRON, RETICCTPCT,  in the last 72 hours  Coagulation profile No results found for this basename: INR, PROTIME,  in the last 168 hours  No results found for this basename: DDIMER,  in the last 72 hours  Cardiac Enzymes  Recent Labs Lab 06/17/12 0050 06/17/12 0649 06/17/12 1300  TROPONINI <0.30 <0.30 <0.30    ------------------------------------------------------------------------------------------------------------------ No components found with this basename: POCBNP,

## 2012-06-21 NOTE — Progress Notes (Addendum)
Speech Language Pathology Dysphagia Treatment Patient Details Name: [name] [name] MRN: [id number] DOB: 08-29-1940 Today's Date: 06/21/2012 Time: 4098-1191 SLP Time Calculation (min): 32 min  Assessment / Plan / Recommendation Clinical Impression  Pt seen for skilled dysphagia treatment to assess tolerance of po diet, appropriateness for dietary advancement.  Pt observed consuming water and icecream, total verbal/visual cues to try to close her mouth during swallow to increase intraoral bolus pressure.  Anterior loss (right) of icecream and water noted, with lingual pumping observed as pt trying to orally transit boluses.  Delayed cough x2 noted even on secretions- suspect secretions mixed with po.   Pt has h/o difficulties managing secretions - noted on [facility] June 2013.   Note pt continues to spike temperatures over the weekend.  Pt again states dysphagia worse x2 weeks with sudden onset - most predominantly x  last week though.  She does not desire feeding tube and wants to consume po with ongoing aspiration and remain a full code.  SLP relayed information to MD.    Agree pt will likely have recurrent aspiration pnas and possible malnutrition with this worsening of dysphagia.   Concern for ongoing fevers being due to ongoing aspiration.  Suspect poor functional reserve that allowed pt to tolerate aspiration PTA, not functional currently.  Pt for MRI to find possible source for pt's reported change in swallow ability per MD.  PEG tube will not prevent pt from aspirating secretions but would prevent overt aspiration of food/drink consumed.     Diet Recommendation  Initiate / Change Diet:  (with risks known if md desires and agrees)    SLP Plan Continue with current plan of care   Pertinent Vitals/Pain Febrile, congested lung sounds, note recent CXR showed some improvement    Swallowing Goals  SLP Swallowing Goals Swallow Study Goal #2 - Progress: Progressing toward goal Swallow Study Goal #3 -  Progress: Progressing toward goal  General Temperature Spikes Noted: Yes Respiratory Status: Room air Behavior/Cognition: Alert;Cooperative;Pleasant mood Oral Cavity - Dentition: Adequate natural dentition Patient Positioning: Upright in bed  Oral Cavity - Oral Hygiene     Dysphagia Treatment Treatment focused on: Skilled observation of diet tolerance;Patient/family/caregiver education;Upgraded PO texture trials;Facilitation of oral preparatory phase Family/Caregiver Educated: pt Treatment Methods/Modalities: Skilled observation;Effortful swallow Patient observed directly with PO's: Yes Type of PO's observed: Dysphagia 1 (puree);Thin liquids Feeding: Able to feed self Liquids provided via: Teaspoon;Cup Oral Phase Signs & Symptoms: Anterior loss/spillage;Right pocketing;Prolonged bolus formation;Prolonged oral phase Pharyngeal Phase Signs & Symptoms: Suspected delayed swallow initiation;Delayed cough Type of cueing: Verbal Amount of cueing: Minimal (to try to close mouth to swallow, to incr intraoral pressure)   GO     Donavan Burnet, MS [facility] SLP [phone]

## 2012-06-21 NOTE — Progress Notes (Signed)
Placed pt on chest vest. 10 Hz , pressure control of 10 , for 10 min. Pt is tolerating well

## 2012-06-21 NOTE — Progress Notes (Signed)
Echocardiogram 2D Echocardiogram has been performed.  [name] [name] 06/21/2012, 3:28 PM

## 2012-06-21 NOTE — Progress Notes (Signed)
Pharmacy: Vancomycin   D#5 Vancomycin for HCAP,  Vancomycin trough (8.1) is below goal of 15-20 on vancomycin 750 mg IV q12h.  Scr is wnl and stable.   Plan 1.) Increase Vancomycin to 1250 mg IV q12h  2.) Repeat VT at C[facility]  Ahtziry Saathoff, Loma Messing PharmD Pager #: [phone] 4:32 PM 06/21/2012  ]

## 2012-06-21 NOTE — Progress Notes (Signed)
Temp at 2330 was 101.8, given tylenol suppository and temp came down to 98.8

## 2012-06-21 NOTE — Progress Notes (Signed)
Progress Note from the Palliative Medicine Team at [facility]  Subjective: patient is alert and oriented, communicates by written language     Objective: Allergies  Allergen Reactions  . Penicillins Swelling    Swelling of mouth   Scheduled Meds: . antiseptic oral rinse  15 mL Mouth Rinse BID  . aspirin  150 mg Rectal Daily  . aztreonam  2 g Intravenous Q8H  . clopidogrel  75 mg Oral Daily  . enoxaparin (LOVENOX) injection  40 mg Subcutaneous Q24H  . escitalopram  20 mg Oral Daily  . insulin aspart  0-15 Units Subcutaneous Q6H  . insulin aspart  0-5 Units Subcutaneous QHS  . insulin glargine  20 Units Subcutaneous QHS  . metoprolol tartrate  25 mg Oral BID  . pantoprazole (PROTONIX) IV  40 mg Intravenous Q12H  . potassium chloride  10 mEq Intravenous Q1 Hr x 4  . vancomycin  750 mg Intravenous Q12H   Continuous Infusions:  PRN Meds:.acetaminophen, metoprolol  BP 116/54  Pulse 66  Temp(Src) 99 F (37.2 C) (Oral)  Resp 18  Ht 5\' 5"  (1.651 m)  Wt 78.109 kg (172 lb 3.2 oz)  BMI 28.66 kg/m2  SpO2 100%   PPS:30 % at best  Pain Score:denies  Intake/Output Summary (Last 24 hours) at 06/21/12 1317 Last data filed at 06/21/12 0900  Gross per 24 hour  Intake    970 ml  Output   1000 ml  Net    -30 ml       Physical Exam: General: chronically ill appearing, NAD, sitting up in bed  HEENT: Moist mucosus membranes, unable to handle secretions, increased drooling  Chest: diminished in bases, CTA  CVS: RRR  Abdomen:soft NT +BS  Ext: without edema, with muscle atrophy  Neuro: aphasic, alert and oriented X3, communicates by writng and few audible resposnses         Labs: CBC    Component Value Date/Time   WBC 9.4 06/21/2012 0658   RBC 4.15 06/21/2012 0658   HGB 11.0* 06/21/2012 0658   HCT 33.4* 06/21/2012 0658   PLT 261 06/21/2012 0658   MCV 80.5 06/21/2012 0658   MCH 26.5 06/21/2012 0658   MCHC 32.9 06/21/2012 0658   RDW 13.5 06/21/2012 0658   LYMPHSABS 1.5  06/17/2012 0649   MONOABS 1.5* 06/17/2012 0649   EOSABS 0.0 06/17/2012 0649   BASOSABS 0.0 06/17/2012 0649    BMET    Component Value Date/Time   NA 135 06/21/2012 0658   K 3.2* 06/21/2012 0658   CL 100 06/21/2012 0658   CO2 27 06/21/2012 0658   GLUCOSE 166* 06/21/2012 0658   BUN 8 06/21/2012 0658   CREATININE 0.82 06/21/2012 0658   CALCIUM 9.2 06/21/2012 0658   GFRNONAA 70* 06/21/2012 0658   GFRAA 81* 06/21/2012 0658    CMP     Component Value Date/Time   NA 135 06/21/2012 0658   K 3.2* 06/21/2012 0658   CL 100 06/21/2012 0658   CO2 27 06/21/2012 0658   GLUCOSE 166* 06/21/2012 0658   BUN 8 06/21/2012 0658   CREATININE 0.82 06/21/2012 0658   CALCIUM 9.2 06/21/2012 0658   PROT 6.8 06/17/2012 0649   ALBUMIN 2.4* 06/17/2012 0649   AST 25 06/17/2012 0649   ALT 25 06/17/2012 0649   ALKPHOS 60 06/17/2012 0649   BILITOT 0.4 06/17/2012 0649   GFRNONAA 70* 06/21/2012 0658   GFRAA 81* 06/21/2012 0658      Assessment and Plan: 1. Code  Status:Full code 2. Symptom Control:     Dysphagia: Patient wishes to continue with liquid diet with known risk of aspiration.  I again discussed with her the strong possibility that she will again need treatment for aspiration PNA and respiratory failure possibly resulting in need for intubation.  She understands and again communicate her plan to dc to SNF for rehabilitation in hopes of regaining strength and improvement in swallow before considering a PEG.  Recommend Palliative to follow on dc  Patient Documents Completed or Given: Document Given Completed  Advanced Directives Pkt    MOST    DNR    Gone from My Sight    Hard Choices  yes    Time In Time Out Total Time Spent with Patient Total Overall Time  1300 1325 25 min 25 min    Greater than 50%  of this time was spent counseling and coordinating care related to the above assessment and plan.  [name] NP  Palliative Medicine Team Team Phone # [phone] Pager [phone]  Discussed with Dr Thedore Mins 1

## 2012-06-21 NOTE — Progress Notes (Signed)
Patient back from MRI, no resp. Distress noted and patient denies any distress, oxygen level 94-98%-RA but 2Ls of oxygen applied per Dr. Doristine Church order. Will continue to assess patient.

## 2012-06-21 NOTE — Progress Notes (Signed)
ANTIBIOTIC CONSULT NOTE - FOLLOW UP  Pharmacy Consult for Vancomycin and Aztreonam Indication: HCAP vs Aspiration PNA  Allergies  Allergen Reactions  . Penicillins Swelling    Swelling of mouth    Patient Measurements: Height: 5\' 5"  (165.1 cm) Weight: 172 lb 3.2 oz (78.109 kg) IBW/kg (Calculated) : 57  Vital Signs: Temp: 99 F (37.2 C) (04/28 0655) Temp src: Oral (04/28 0655) BP: 116/54 mmHg (04/28 0655) Pulse Rate: 66 (04/28 0655) Intake/Output from previous day: 04/27 0701 - 04/28 0700 In: 1050 [P.O.:600; IV Piggyback:450] Out: 1000 [Urine:1000] Intake/Output from this shift: Total I/O In: 240 [P.O.:240] Out: -   Labs:  Recent Labs  06/19/12 0716 06/21/12 0658  WBC 12.6* 9.4  HGB 10.7* 11.0*  PLT 212 261  CREATININE 0.89 0.82   Estimated Creatinine Clearance: 65 ml/min (by C-G formula based on Cr of 0.82). 70 ml/min/1.53m2 (normalized)   Microbiology: Recent Results (from the past 720 hour(s))  CULTURE, BLOOD (ROUTINE X 2)     Status: None   Collection Time    06/16/12  8:40 PM      Result Value Range Status   Specimen Description BLOOD LEFT ARM   Final   Special Requests BOTTLES DRAWN AEROBIC AND ANAEROBIC   Final   Culture  Setup Time 06/17/2012 06:59   Final   Culture     Final   Value:        BLOOD CULTURE RECEIVED NO GROWTH TO DATE CULTURE WILL BE HELD FOR 5 DAYS BEFORE ISSUING A FINAL NEGATIVE REPORT   Report Status PENDING   Incomplete  CULTURE, BLOOD (ROUTINE X 2)     Status: None   Collection Time    06/16/12  8:45 PM      Result Value Range Status   Specimen Description BLOOD LEFT ARM   Final   Special Requests BOTTLES DRAWN AEROBIC AND ANAEROBIC   Final   Culture  Setup Time 06/17/2012 06:58   Final   Culture     Final   Value:        BLOOD CULTURE RECEIVED NO GROWTH TO DATE CULTURE WILL BE HELD FOR 5 DAYS BEFORE ISSUING A FINAL NEGATIVE REPORT   Report Status PENDING   Incomplete  URINE CULTURE     Status: None   Collection  Time    06/16/12  9:40 PM      Result Value Range Status   Specimen Description URINE, CATHETERIZED   Final   Special Requests NONE   Final   Culture  Setup Time 06/17/2012 07:11   Final   Colony Count NO GROWTH   Final   Culture NO GROWTH   Final   Report Status 06/18/2012 FINAL   Final   Antibiotics: 4/24 >> Levaquin x 1 dose 4/24 >> Vancomycin >> 4/24 >> Aztreonam >>  Assessment: 72 yo F with recent hospitalization admitted 4/23 with HCAP / aspiration PNA. Pt with dysphagia from previous stroke, therefore do not anticipate transition to PO antibiotics at this time  Day #5 Vancomycin and Aztreonam (PCN allergy)  Tmax 101.8 overnight, 99 this am  WBC improved to normal  SCr stable at 0.82, CrCl ~70 ml/min/1.15m2 (normalized)  Cultures negative to date   Goal of Therapy:  Vancomycin trough level 15-20 mcg/ml  Plan:   Check Vancomycin trough this afternoon  Continue Aztreonam 1gm IV q8h Follow up renal function & cultures  Loralee Pacas, PharmD, BCPS Pager: [phone] 06/21/2012,11:18 AM

## 2012-06-21 NOTE — Progress Notes (Signed)
Per Dr. Thedore Mins, Carotid Doppler does not need repeating at this time.  Study from 05/05/12 revealed 40-59% Right ICA stenosis, low end of scale, and <40% Left ICA stenosis.   Farrel Demark, RDMS, RVT 06/21/2012

## 2012-06-22 LAB — LIPID PANEL
HDL: 11 mg/dL — ABNORMAL LOW (ref >39)
LDL Cholesterol: 76 mg/dL (ref 0–99)
Total CHOL/HDL Ratio: 9.6 RATIO
Triglycerides: 94 mg/dL (ref <150)
VLDL: 19 mg/dL (ref 0–40)

## 2012-06-22 LAB — GLUCOSE, CAPILLARY: Glucose-Capillary: 253 mg/dL — ABNORMAL HIGH (ref 70–99)

## 2012-06-22 MED ORDER — METOPROLOL TARTRATE 25 MG/10 ML ORAL SUSPENSION
25.0000 mg | Freq: Two times a day (BID) | ORAL | Status: DC
  Administered 2012-06-22 – 2012-06-23 (×2): 25 mg via ORAL
  Filled 2012-06-22 (×3): qty 10

## 2012-06-22 MED ORDER — METOPROLOL TARTRATE 25 MG PO TABS: 25.0000 mg | ORAL_TABLET | Freq: Two times a day (BID) | ORAL | Status: DC

## 2012-06-22 MED ORDER — INSULIN ASPART 100 UNIT/ML ~~LOC~~ SOLN: SUBCUTANEOUS | Status: DC

## 2012-06-22 MED ORDER — METRONIDAZOLE 500 MG PO TABS: 500.0000 mg | ORAL_TABLET | Freq: Three times a day (TID) | ORAL | Status: DC

## 2012-06-22 MED ORDER — INSULIN GLARGINE 100 UNIT/ML ~~LOC~~ SOLN: 20.0000 [IU] | Freq: Every day | SUBCUTANEOUS | Status: DC

## 2012-06-22 MED ORDER — LEVOFLOXACIN 750 MG PO TABS: 750.0000 mg | ORAL_TABLET | Freq: Every day | ORAL | Status: DC

## 2012-06-22 NOTE — Progress Notes (Signed)
Progress Note from the Palliative Medicine Team at [facility]  Subjective: pateint is alert and oriented, communicating though written language     Objective: Allergies  Allergen Reactions  . Penicillins Swelling    Swelling of mouth   Scheduled Meds: . antiseptic oral rinse  15 mL Mouth Rinse BID  . aspirin  150 mg Rectal Daily  . aztreonam  2 g Intravenous Q8H  . clopidogrel  75 mg Oral Daily  . enoxaparin (LOVENOX) injection  40 mg Subcutaneous Q24H  . escitalopram  20 mg Oral Daily  . insulin aspart  0-15 Units Subcutaneous Q6H  . insulin aspart  0-5 Units Subcutaneous QHS  . insulin glargine  20 Units Subcutaneous QHS  . metoprolol tartrate  25 mg Oral BID  . pantoprazole (PROTONIX) IV  40 mg Intravenous Q12H  . vancomycin  1,250 mg Intravenous Q12H   Continuous Infusions:  PRN Meds:.acetaminophen, metoprolol  BP 132/73  Pulse 82  Temp(Src) 99.7 F (37.6 C) (Oral)  Resp 18  Ht 5\' 5"  (1.651 m)  Wt 78.109 kg (172 lb 3.2 oz)  BMI 28.66 kg/m2  SpO2 100%   PPS:30 % at best  Pain Score:denies   Intake/Output Summary (Last 24 hours) at 06/22/12 1600 Last data filed at 06/22/12 1427  Gross per 24 hour  Intake   1570 ml  Output   1100 ml  Net    470 ml       Physical Exam: General: chronically ill appearing, NAD, sitting up in bed  HEENT: Moist mucosus membranes, unable to handle secretions, increased drooling  Chest: diminished in bases, CTA  CVS: RRR  Abdomen:soft NT +BS  Ext: without edema, with muscle atrophy  Neuro: aphasic, alert and oriented X3, communicates by writng and few audible resposnses       Labs: CBC    Component Value Date/Time   WBC 9.4 06/21/2012 0658   RBC 4.15 06/21/2012 0658   HGB 11.0* 06/21/2012 0658   HCT 33.4* 06/21/2012 0658   PLT 261 06/21/2012 0658   MCV 80.5 06/21/2012 0658   MCH 26.5 06/21/2012 0658   MCHC 32.9 06/21/2012 0658   RDW 13.5 06/21/2012 0658   LYMPHSABS 1.5 06/17/2012 0649   MONOABS 1.5* 06/17/2012 0649   EOSABS 0.0 06/17/2012 0649   BASOSABS 0.0 06/17/2012 0649    BMET    Component Value Date/Time   NA 135 06/21/2012 0658   K 3.7 06/22/2012 0705   CL 100 06/21/2012 0658   CO2 27 06/21/2012 0658   GLUCOSE 166* 06/21/2012 0658   BUN 8 06/21/2012 0658   CREATININE 0.82 06/21/2012 0658   CALCIUM 9.2 06/21/2012 0658   GFRNONAA 70* 06/21/2012 0658   GFRAA 81* 06/21/2012 0658    CMP     Component Value Date/Time   NA 135 06/21/2012 0658   K 3.7 06/22/2012 0705   CL 100 06/21/2012 0658   CO2 27 06/21/2012 0658   GLUCOSE 166* 06/21/2012 0658   BUN 8 06/21/2012 0658   CREATININE 0.82 06/21/2012 0658   CALCIUM 9.2 06/21/2012 0658   PROT 6.8 06/17/2012 0649   ALBUMIN 2.4* 06/17/2012 0649   AST 25 06/17/2012 0649   ALT 25 06/17/2012 0649   ALKPHOS 60 06/17/2012 0649   BILITOT 0.4 06/17/2012 0649   GFRNONAA 70* 06/21/2012 0658   GFRAA 81* 06/21/2012 0658    Assessment and Plan:  1. Code Status:Full code 2. Symptom Control: Dysphagia: Patient wishes to continue with liquid diet with known risk  of aspiration. I again discussed with her the strong possibility that she will again need treatment for aspiration PNA and respiratory failure possibly resulting in need for intubation. She understands and again communicate her plan to dc to SNF for rehabilitation in hopes of regaining strength and improvement in swallow before considering a PEG.   Recommend Palliative to follow on dc  Detailed discussion had today (with Ethics involved) to verify patient's  understanding of her high risk situation for aspiration and resultant medical issues    Patient Documents Completed or Given: Document Given Completed  Advanced Directives Pkt    MOST    DNR    Gone from My Sight    Hard Choices  yes    Time In Time Out Total Time Spent with Patient Total Overall Time  0925 0955 30 min 30 min    Greater than 50%  of this time was spent counseling and coordinating care related to the above assessment and plan.  [name] NP  Palliative Medicine Team Team Phone # [phone] Pager [phone]  Discussed with Dr Thedore Mins 1

## 2012-06-22 NOTE — Discharge Summary (Addendum)
[facility]                                                                                   [name] [name], is a 72 y.o. female  DOB [birth year]  MRN [id number].  Admission date:  06/16/2012  Discharge Date:  06/22/2012  Primary MD  Ralene Ok, MD  Admitting Physician  Therisa Doyne, MD  Admission Diagnosis  HTN (hypertension) [401.9] Diabetes mellitus [250.00] Chest pain [786.50]  Discharge Diagnosis     Active Problems:   Chest pain   HTN (hypertension)   Diabetes mellitus   Healthcare-associated pneumonia   Palliative care encounter   Dysphagia, unspecified   Past Medical History  Diagnosis Date  . 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 Communicates by writing   . Hypertension   . Hypercholesteremia   . Complication of anesthesia     lung   . Angina   . GERD (gastroesophageal reflux disease)   . Myocardial infarction     CABG 2001, ? stent. Cath 02/2011 with new distal LIMA-LAD 80%. SVG-OM occlusion is old, rest of grafts patent  . Peripheral vascular disease   . Diabetes mellitus   . H/O hiatal hernia     Past Surgical History  Procedure Laterality Date  . Coronary artery bypass graft    . Abdominal hysterectomy    . Breast biopsy    . Esophagogastroduodenoscopy  03/12/2011    Procedure: ESOPHAGOGASTRODUODENOSCOPY (EGD);  Surgeon: Theda Belfast, MD;  Location: [facility] ENDOSCOPY;  Service: Endoscopy;  Laterality: N/A;  . Cardiac catheterization  02/2011     Recommendations for primary care physician for things to follow:   Monitor clinically, patient remains high risk for aspiration and repeat respiratory failure, she refuses feeding tube, she is adamant to continue eating from mouth and wants her complications of aspiration to be treated as they occur, she wishes to remain full code.   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 female, who had CVA initially 10 years ago and then last year, TIA this March all causing expressive aphasia, right sided weakness along with dysphagia, she was at that time deemed to be high risk for aspiration but patient shows no feeding tube and continued oral diet with a normal risk of aspirating. She has been noncompliant with her recommended diet at home per family as noted in the history and physical exam, she is being followed by home speech therapist on a regular basis at home.    She was admitted few days ago  with aspiration pneumonia, patient again chose not to get a feeding tube and wanted her care to be directed towards comfort, palliative care was involved consultation was done on 06/20/2012 however at that time in the presence of family patient decided that she wanted to be full code, she also said that she would like to continue eating with the known risk of aspiration. And wants to be treated for complications as arise him recurrent aspiration and wishes to be full code at this time.    Detailed discussion bedside were made in the presence of palliative care, speech therapist, ethics consultant and spiritual care team. For aspiration pneumonia this admission has improved, this was due to minimal oral diet that was given to her, also pulmonary toiletry and aspiration precautions, broad-spectrum IV antibiotics, my assessment is once supportive care is minimized  she will most definitely get another aspiration pneumonia and concurrent respiratory failure. She is currently on full liquid diet with feeding assistance and full aspiration precautions, this should be continued along with close monitoring by speech therapy, every 8 hours chest physiotherapy along with pulmonary toiletry and suctioning should be continued. She is currently refusing some of her pills from time to time, she has been counseled on medication compliance.    She later in the admission informed the staff that her feeding was somewhat worse for the last 2 weeks, I question if she had another stroke about 2 weeks ago, she continues to be aphasic and come indicates mostly with head nods and writing from left hand, her CT scan this admission which was about 5-7 days after this change was stable, repeat echo gram did not show any source of clot, recent carotid ultrasound from March 2014 was stable, lipid panel this admission along with A1c was checked, lipid panel was stable and she remains on statin, and MRI of the brain was ordered but could not be done as patient was unable to lay flat due to her shortness of breath. It is certainly feasible that she could have had a new CVA, however it is potentially small and not seen on CT scan which was done 5-7 days after the event possibly occured. She is already on Plavix which will be continued, close outpatient followup with neurology post discharge is recommended. Continued work with speech therapy upon discharge is also recommended.    For her type 2 diabetes mellitus and dyslipidemia medications as below will be continued, I have changed her to Lantus along with sliding scale insulin, these should be given to the patient in a crushed form with again feeding assistance aspiration precautions.      Today   Subjective:   [name] [name] today has no headache,no chest abdominal pain,no new weakness tingling or numbness, feels much  better.   Objective:   Blood pressure 142/55, pulse 63, temperature 99.6 F (37.6 C), temperature source Oral, resp. rate 18, height 5\' 5"  (1.651 m), weight 78.109 kg (172 lb 3.2 oz), SpO2 100.00%.   Intake/Output Summary (Last 24 hours) at 06/22/12 1208 Last data filed at 06/22/12 0600  Gross per 24 hour  Intake   1410 ml  Output    900 ml  Net    510 ml    Exam Awake Alert, Oriented *3, No new F.N deficits, Normal affect [place].AT,PERRAL Supple Neck,No JVD, No cervical lymphadenopathy appriciated.  Symmetrical Chest wall movement, Good air movement bilaterally, CTAB RRR,No Gallops,Rubs or new Murmurs, No Parasternal Heave +ve B.Sounds, Abd Soft, Non tender, No organomegaly appriciated,  No rebound -guarding or rigidity. No Cyanosis, Clubbing or edema, No new Rash or bruise  Data Review   Major procedures and Radiology Reports - PLEASE review detailed and final reports for all details in brief -    Echo this admission  No cardiac source of embolism was identified, but cannot be ruled out on the basis of this examination.   Echo March 2014  Left ventricle: Wall thickness was increased in a pattern of mild LVH. Systolic function was mildly reduced. The estimated ejection fraction was in the range of 45% to 50%. Akinesis of the basal-midinferior myocardium. There was an increased relative contribution of atrial contraction to ventricular filling.     Carotid US March 2014  - Right = 40-59% ICA stenosis. Left = <40% ICA stenosis. Antegrade vertebral flow.     Dg Chest 2 View  06/16/2012  *RADIOLOGY REPORT*  Clinical Data: 72 year old female shortness of breath and weakness.  CHEST - 2 VIEW  Comparison: 05/04/2012.  Findings: Semi upright AP and lateral views of the chest.  New right upper lobe consolidation, widespread.  Bordering the major and minor fissures.  Stable lung volumes.  Sequelae of CABG.  Left lung is stable and clear. Nonobstructed bowel gas pattern, no  free air.  Visualized tracheal air column is within normal limits.  Mild compression fracture lower thoracic spine age indeterminate.  IMPRESSION: Extensive right upper lobe pneumonia. Recommend post-treatment radiographs to document resolution.   Original Report Authenticated By: Erskine Speed, M.D.    Dg Chest Port 1 View  06/21/2012  *RADIOLOGY REPORT*  Clinical Data: Aspiration  PORTABLE CHEST - 1 VIEW  Comparison: June 17, 2012.  Findings: Sternotomy wires are noted.  Cardiomediastinal silhouette appears normal.  Left lung is clear.  The right upper lobe consolidation noted on prior exam is improved although significant residual density remains.  IMPRESSION: Slightly improved right upper lobe pneumonia compared to prior exam.   Original Report Authenticated By: Lupita Raider.,  M.D.    Dg Chest Port 1 View  06/17/2012  *RADIOLOGY REPORT*  Clinical Data: Cough and shortness of breath  PORTABLE CHEST - 1 VIEW  Comparison:  June 16, 2012  Findings:  Extensive right upper lobe consolidation appears stable. Lungs elsewhere clear.  Heart size and pulmonary vascularity normal.  The patient is status post coronary artery bypass grafting.  Aorta is prominent with atherosclerotic change, stable. No adenopathy.  IMPRESSION: Persistent extensive right upper lobe consolidation. No new opacity. There is thoracic aortic prominence, a finding probably indicative of chronic hypertensive change.   Original Report Authenticated By: Bretta Bang, M.D.     Micro Results      Recent Results (from the past 240 hour(s))  CULTURE, BLOOD (ROUTINE X 2)     Status: None   Collection Time    06/16/12  8:40 PM      Result Value Range Status   Specimen Description BLOOD LEFT ARM   Final   Special Requests BOTTLES DRAWN AEROBIC AND ANAEROBIC   Final   Culture  Setup Time 06/17/2012 06:59   Final   Culture     Final   Value:        BLOOD CULTURE RECEIVED NO GROWTH TO DATE CULTURE WILL BE HELD FOR 5 DAYS BEFORE  ISSUING A FINAL NEGATIVE REPORT   Report Status PENDING   Incomplete  CULTURE, BLOOD (ROUTINE X 2)     Status: None   Collection Time    06/16/12  8:45 PM      Result Value Range Status   Specimen Description BLOOD LEFT ARM   Final   Special Requests BOTTLES DRAWN AEROBIC AND ANAEROBIC   Final   Culture  Setup Time 06/17/2012 06:58   Final   Culture     Final   Value:        BLOOD CULTURE RECEIVED NO GROWTH TO DATE CULTURE WILL BE HELD FOR 5 DAYS BEFORE ISSUING A FINAL NEGATIVE REPORT   Report Status PENDING   Incomplete  URINE CULTURE     Status: None   Collection Time    06/16/12  9:40 PM      Result Value Range Status   Specimen Description URINE, CATHETERIZED   Final   Special Requests NONE   Final   Culture  Setup Time 06/17/2012 07:11   Final   Colony Count NO GROWTH   Final   Culture NO GROWTH   Final   Report Status 06/18/2012 FINAL   Final   Lab Results  Component Value Date   HGBA1C 7.5* 06/17/2012    Lab Results  Component Value Date   CHOL 106 06/22/2012   HDL 11* 06/22/2012   LDLCALC 76 06/22/2012   TRIG 94 06/22/2012   CHOLHDL 9.6 06/22/2012     CBC w Diff: Lab Results  Component Value Date   WBC 9.4 06/21/2012   HGB 11.0* 06/21/2012   HCT 33.4* 06/21/2012   PLT 261 06/21/2012   LYMPHOPCT 13 06/17/2012   MONOPCT 13* 06/17/2012   EOSPCT 0 06/17/2012   BASOPCT 0 06/17/2012    CMP: Lab Results  Component Value Date   NA 135 06/21/2012   K 3.2* 06/21/2012   CL 100 06/21/2012   CO2 27 06/21/2012   BUN 8 06/21/2012   CREATININE 0.82 06/21/2012   PROT 6.8 06/17/2012   ALBUMIN 2.4* 06/17/2012   BILITOT 0.4 06/17/2012   ALKPHOS 60 06/17/2012   AST 25 06/17/2012   ALT 25 06/17/2012  .   Discharge Instructions     Follow with Primary MD Ralene Ok, MD in 3 days   Get CBC, CMP, checked 3 days by Primary MD and again as instructed by your Primary MD. Get a 2 view Chest X ray done next visit.  Get Medicines reviewed and adjusted.  Please request your Prim.MD to  go over all Hospital Tests and Procedure/Radiological results at the follow up, please get all Hospital records sent to your Prim MD by signing hospital release before you go home.  Activity: As tolerated with Full fall precautions use walker/cane & assistance as needed   Diet:  Full liquid diet with full assistance and Aspiration precautions. This is per patient's request, she clearly understands she is an extremely high risk for aspiration, pneumonia and could even amount to death. She does not want feeding tube, she wants to continue eating by mouth and be treated for complications as they occur.  For Heart failure patients - Check your Weight same time everyday, if you gain over 2 pounds, or you develop in leg swelling, experience more shortness of breath or chest pain, call your Primary MD immediately. Follow Cardiac Low Salt Diet and 1.8 lit/day fluid restriction.  Disposition SNF  If you experience worsening of your admission symptoms, develop shortness of breath, life threatening emergency, suicidal or homicidal thoughts you must seek medical attention immediately by calling 911 or calling your MD immediately  if symptoms less severe.  You Must read complete  instructions/literature along with all the possible adverse reactions/side effects for all the Medicines you take and that have been prescribed to you. Take any new Medicines after you have completely understood and accpet all the possible adverse reactions/side effects.   Do not drive and provide baby sitting services if your were admitted for syncope or siezures until you have seen by Primary MD or a Neurologist and advised to do so again.    Follow-up Information   Follow up with Ralene Ok, MD. Schedule an appointment as soon as possible for a visit in 3 days.   Contact information:   [address] [place] [address] [phone]       Follow up with Gates Rigg, MD. Schedule an appointment as soon as  possible for a visit in 1 week.   Contact information:   [address] [place] [address] [phone]         Discharge Medications     Medication List    STOP taking these medications       insulin aspart protamine- aspart (70-30) 100 UNIT/ML injection  Commonly known as:  NOVOLOG 70/30      TAKE these medications       clopidogrel 75 MG tablet  Commonly known as:  PLAVIX  Take 75 mg by mouth daily.     escitalopram 20 MG tablet  Commonly known as:  LEXAPRO  Take 20 mg by mouth daily.     hydrochlorothiazide 25 MG tablet  Commonly known as:  HYDRODIURIL  Take 25 mg by mouth daily.     insulin aspart 100 UNIT/ML injection  Commonly known as:  novoLOG  Before each meal 3 times a day, 140-199 - 2 units, 200-250 - 4 units, 251-299 - 6 units,  300-349 - 8 units,  350 or above 10 units.  Insulin PEN if approved, provide syringes and needles if needed.     insulin glargine 100 UNIT/ML injection  Commonly known as:  LANTUS  Inject 0.2 mLs (20 Units total) into the skin at bedtime.     levofloxacin 750 MG tablet  Commonly known as:  LEVAQUIN  Take 1 tablet (750 mg total) by mouth daily. 4 more days.     metoprolol tartrate 25 MG tablet  Commonly known as:  LOPRESSOR  Take 1 tablet (25 mg total) by mouth 2 (two) times daily.     metroNIDAZOLE 500 MG tablet  Commonly known as:  FLAGYL  Take 1 tablet (500 mg total) by mouth 3 (three) times daily. 4 more days     nitroGLYCERIN 0.4 MG SL tablet  Commonly known as:  NITROSTAT  Place 0.4 mg under the tongue every 5 (five) minutes as needed for chest pain.     rosuvastatin 40 MG tablet  Commonly known as:  CRESTOR  Take 40 mg by mouth daily.     VITAMIN D PO  Take 1 tablet by mouth daily.           Total Time in preparing paper work, data evaluation and todays exam - 35 minutes  Leroy Sea M.D on 06/22/2012 at 12:08 PM  [facility] Office  [phone]

## 2012-06-22 NOTE — Ethics Note (Signed)
Ethics Consult Note:  The request was received from Dr. Thedore Mins due to concerns that given the effects of multiple strokes the patient lacks sufficient capacity to eat without leading to aspiration and consequent aspiration pneumonia and further complications. Current diet is intended for comfort and the satisfaction of being able to eat even though the risk of aspiration exist.The purpose of the consult was to clarify with the patient an understanding of the risk associated with continuing to eat and that it is not believed by the physician to be in her best interest due to the risk. In addition the consult addressed the question of extent of aggressive care in the light of future aspirations - at what point would [name] [name] not want to have continued aggressive care.  The patient was quite clear that she did want to be able to continue to eat and said she realized that " I am doomed one way or the other, " meaning she will likely aspirate from her own saliva as well as food. (She indicated that she has always enjoyed eating. )When asked about her desire for treatment for future aspiration pneumonia or other complications, she indicated ( by writing ) that she would want to be treated with antibiotics ,but would not want aggressive care if she lost her ability to be mentally alert () to be as she is now ), or if her survival would depend on being on a ventilator from which she would likely not be able to be removed.   Her current state of life is acceptable to her and if she can be returned to this current state by treatments then that is what she would want.  This plan was agreed to by her physician.  The other participant in the consult was [name] Creed, NP Palliative care service who has worked with [name] regarding her condition and wishes,  I appreciate the invitation from Dr. Thedore Mins to assist in this case.    [name] [name] Ethics Consult Service  [phone]

## 2012-06-22 NOTE — Progress Notes (Signed)
Clinical Social Work Department BRIEF PSYCHOSOCIAL ASSESSMENT 06/22/2012  Patient:  Constantino,Daysia     Account Number:  [id number]     Admit date:  06/16/2012  Clinical Social Worker:  Orpah Greek  Date/Time:  06/22/2012 04:29 PM  Referred by:  Physician  Date Referred:  06/22/2012 Referred for  SNF Placement   Other Referral:   Interview type:  Patient Other interview type:    PSYCHOSOCIAL DATA Living Status:  ALONE Admitted from facility:   Level of care:   Primary support name:  Andree Coss (daughter) ph#: [phone] Primary support relationship to patient:  CHILD, ADULT Degree of support available:   poor - CSW has attempted to call several times, with no call back.    CURRENT CONCERNS Current Concerns  Post-Acute Placement   Other Concerns:    SOCIAL WORK ASSESSMENT / PLAN CSW spoke with patient re: discharge planning. Patient was from home alone and is agreeable with plan for SNF. CSW went through list of facilities & through head nods and writing on her clipboard has decided on a SNF.   Assessment/plan status:  Information/Referral to [facility] Other assessment/ plan:   Information/referral to community resources:   CSW completed FL2 and faxed information out to [facility] - provided bed offers to patient.    PATIENT'S/FAMILY'S RESPONSE TO PLAN OF CARE: Patient is agreeable with plan for SNF - patient reports she has 2 daughters but are not very involved in her care at all. Awaiting [facility] Medicare HMO insurance approval.       Unice Bailey, LCSW [facility] Clinical Social Worker cell #: [phone]

## 2012-06-22 NOTE — Progress Notes (Signed)
Patient has accepted bed offer @ [facility]. Awaiting [facility] HMO insurance authorization.   Clinical Social Work Department CLINICAL SOCIAL WORK PLACEMENT NOTE 06/22/2012  Patient:  [name] [name],[name] [name]  Account Number:  [id number] Admit date:  06/16/2012  Clinical Social Worker:  Orpah Greek  Date/time:  06/22/2012 04:32 PM  Clinical Social Work is seeking post-discharge placement for this patient at the following level of care:   SKILLED NURSING   (*CSW will update this form in Epic as items are completed)   06/22/2012  Patient/family provided with [facility] Department of Clinical Social Work's list of facilities offering this level of care within the geographic area requested by the patient (or if unable, by the patient's family).  06/22/2012  Patient/family informed of their freedom to choose among providers that offer the needed level of care, that participate in Medicare, Medicaid or managed care program needed by the patient, have an available bed and are willing to accept the patient.  06/22/2012  Patient/family informed of [facility]' ownership interest in [facility], as well as of the fact that they are under no obligation to receive care at this facility.  PASARR submitted to EDS on 06/22/2012 PASARR number received from EDS on 06/22/2012  FL2 transmitted to all facilities in geographic area requested by pt/family on  06/22/2012 FL2 transmitted to all facilities within larger geographic area on   Patient informed that his/her managed care company has contracts with or will negotiate with  certain facilities, including the following:   [facility] HMO     Patient/family informed of bed offers received:  06/22/2012 Patient chooses bed at [facility] Physician recommends and patient chooses bed at    Patient to be transferred to [facility] on   Patient to be transferred to facility by   The following  physician request were entered in Epic:   Additional Comments:   Unice Bailey, LCSW [facility] Clinical Social Worker cell #: [phone]

## 2012-06-22 NOTE — Progress Notes (Signed)
Physical Therapy Treatment Patient Details Name: Nilah Belcourt MRN: [id number] DOB: [birth year] Today's Date: 06/22/2012 Time: 6962-9528 PT Time Calculation (min): 56 min  PT Assessment / Plan / Recommendation Comments on Treatment Session  Pt transferred to recliner today with +2 assist and R AFO. Also worked on sitting balance. Progressing with mobility.     Follow Up Recommendations  SNF     Does the patient have the potential to tolerate intense rehabilitation     Barriers to Discharge        Equipment Recommendations  None recommended by PT    Recommendations for Other Services    Frequency Min 3X/week   Plan Discharge plan remains appropriate;Frequency remains appropriate    Precautions / Restrictions Precautions Precautions: Fall Required Braces or Orthoses: Other Brace/Splint Other Brace/Splint: R AFO Restrictions Weight Bearing Restrictions: No   Pertinent Vitals/Pain *no c/o pain**    Mobility  Bed Mobility Bed Mobility: Supine to Sit;Sit to Supine Supine to Sit: 3: Mod assist;With rails;HOB elevated Sitting - Scoot to Edge of Bed: 3: Mod assist;With rail Sit to Supine:  (but ModA to scoot to head of bed. ) Details for Bed Mobility Assistance: min/mod assist to scoot to EOB Transfers Transfers: Pharmacologist;Sit to Stand;Stand to Sit (pt requested to wait until her dtr brought brace and shoes) Sit to Stand: 1: +2 Total assist Sit to Stand: Patient Percentage: 60% Stand to Sit: 1: +2 Total assist Stand to Sit: Patient Percentage: 60% Stand Pivot Transfers: 1: +2 Total assist (SPT x 3 (bed to recliner to 3-in-1 to recliner)) Stand Pivot Transfers: Patient Percentage: 70% Transfer via Lift Equipment: Maximove (recommend nursing use lift for OOB activities) Details for Transfer Assistance: assist to rise and to control descent to chair, assist for R hand placement on RW, assist to steady and maneuver RW during pivot to chair, then SPT to 3-in-1 and back  to recliner with +2 assist Ambulation/Gait Ambulation/Gait Assistance: Not tested (comment)    Exercises General Exercises - Lower Extremity Long Arc Quad: AAROM;AROM;Both;Seated;10 reps (AROM L, AAROM R) Shoulder Exercises Shoulder Flexion: AAROM;Right;Seated;10 reps   PT Diagnosis:    PT Problem List:   PT Treatment Interventions:     PT Goals Acute Rehab PT Goals PT Goal Formulation: With patient Time For Goal Achievement: 07/03/12 Potential to Achieve Goals: Good Pt will go Supine/Side to Sit: with modified independence PT Goal: Supine/Side to Sit - Progress: Progressing toward goal Pt will go Sit to Supine/Side: with modified independence Pt will go Sit to Stand: with min assist PT Goal: Sit to Stand - Progress: Progressing toward goal Pt will go Stand to Sit: with min assist PT Goal: Stand to Sit - Progress: Progressing toward goal Pt will Transfer Bed to Chair/Chair to Bed: with min assist PT Transfer Goal: Bed to Chair/Chair to Bed - Progress: Progressing toward goal Pt will Stand: with min assist;3 - 5 min;with unilateral upper extremity support PT Goal: Stand - Progress: Progressing toward goal  Visit Information  Last PT Received On: 06/22/12 Assistance Needed: +2    Subjective Data  Subjective: Pt fatigued after transfer to recliner.  Patient Stated Goal: work on transfers and walking   Cognition  Cognition Arousal/Alertness: Awake/alert Behavior During Therapy: WFL for tasks assessed/performed Overall Cognitive Status: Within Functional Limits for tasks assessed    Balance  Balance Balance Assessed: Yes Static Sitting Balance Static Sitting - Balance Support: Feet supported;No upper extremity supported Static Standing Balance Static Standing - Balance Support: Bilateral  upper extremity supported Static Standing - Level of Assistance: 3: Mod assist Static Standing - Comment/# of Minutes: Pt stood for 2 min for peri-care, pt held onto RW High Level  Balance High Level Balance Comments: Pt sat on EOB x 10 min. Performed reaching with RUE over BOS without LOB.   End of Session PT - End of Session Equipment Utilized During Treatment: Gait belt;Right ankle foot orthosis Activity Tolerance: Patient tolerated treatment well Patient left: in chair;with call bell/phone within reach Nurse Communication: Mobility status   GP     Ralene Bathe Kistler 06/22/2012, 11:44 AM [phone]

## 2012-06-22 NOTE — Progress Notes (Signed)
Brought patient a prayer shawl and prayed per her request. She was eating some chocolate pudding. She indicated that she was enjoying it. I told her something funny and she laughed. Presence; compassionate presence. Will continue to follow.

## 2012-06-23 DIAGNOSIS — J690 Pneumonitis due to inhalation of food and vomit: Secondary | ICD-10-CM | POA: Diagnosis present

## 2012-06-23 LAB — CREATININE, SERUM
Creatinine, Ser: 0.8 mg/dL (ref 0.50–1.10)
GFR calc Af Amer: 84 mL/min — ABNORMAL LOW (ref >90)

## 2012-06-23 LAB — GLUCOSE, CAPILLARY
Glucose-Capillary: 110 mg/dL — ABNORMAL HIGH (ref 70–99)
Glucose-Capillary: 161 mg/dL — ABNORMAL HIGH (ref 70–99)
Glucose-Capillary: 188 mg/dL — ABNORMAL HIGH (ref 70–99)

## 2012-06-23 LAB — CULTURE, BLOOD (ROUTINE X 2): Culture: NO GROWTH

## 2012-06-23 NOTE — Progress Notes (Addendum)
Speech Language Pathology Dysphagia Treatment Patient Details Name: [name] [name] MRN: [id number] DOB: 1940-08-20 Today's Date: 06/23/2012 Time: 9147-8295 SLP Time Calculation (min): 24 min  Assessment / Plan / Recommendation Clinical Impression  Pt seen for skilled dysphagia treatment to assure all education completed re: aspiration mitigation strategies, etc.  Pt cont him and with fever today and appears sleepy with congested breathing quality.  Note pt unable to participate in MRI secondary to respiratory discomfort.  Encouraged pt to try to swallow with her mouth closed to improve intra oral pressure, which is difficult for her given level of gross weakness.  Pt politely declined po intake but ordered lunch with nurse technician assist.  Intake has been 75-100%.  SLP to sign off as all education is completed with this pt.  Pt is very aware of her chronic dysphagia with suspected acute exacerbation and has clearly expressed her desires to eat.  Liquids are reportedly easier for pt to swallow per pt and may be better absorbed when aspirated vs solids.    Rec pt's daughter bring aug comm device to SNF for pt to use - pt agreed.  Rec SLP follow up at next venue to determine if pt may be able to rehab to baseline level but asp risk will be chronic and ongoing.      Diet Recommendation    full liquid per pt desire   SLP Plan All goals met   Pertinent Vitals/Pain Continued low grade fevers, congested breathing   Swallowing Goals  SLP Swallowing Goals Swallow Study Goal #2 - Progress: Met Swallow Study Goal #3 - Progress: Met  General  pt appears fatigued  Oral Cavity - Oral Hygiene   secretions retained, baseline for pt  Dysphagia Treatment Treatment focused on: Patient/family/caregiver education Family/Caregiver Educated: pt Patient observed directly with PO's: No Reason PO's not observed: Refused (politely declined po intake, nurse tech said pt ate brkfst) Feeding: Needs set up;Able  to feed self   GO     Donavan Burnet, MS [facility] SLP [phone]

## 2012-06-23 NOTE — Progress Notes (Signed)
Report called to Milly Jakob LPN @ [facility].

## 2012-06-23 NOTE — Progress Notes (Signed)
Patient is set to discharge to [facility] SNF today - facility obtained [facility] authorization. Patient aware, CSW left message for patient's daughter Elease Hashimoto per patient's request making her aware that she would be discharging to [facility] today. [facility] called for transport. Discharge packet given to RN, Okey Regal.   Clinical Social Work Department CLINICAL SOCIAL WORK PLACEMENT NOTE 06/23/2012  Patient:  [name],[name]  Account Number:  [id number] Admit date:  06/16/2012  Clinical Social Worker:  Orpah Greek  Date/time:  06/22/2012 04:32 PM  Clinical Social Work is seeking post-discharge placement for this patient at the following level of care:   SKILLED NURSING   (*CSW will update this form in Epic as items are completed)   06/22/2012  Patient/family provided with [facility] Department of Clinical Social Work's list of facilities offering this level of care within the geographic area requested by the patient (or if unable, by the patient's family).  06/22/2012  Patient/family informed of their freedom to choose among providers that offer the needed level of care, that participate in Medicare, Medicaid or managed care program needed by the patient, have an available bed and are willing to accept the patient.  06/22/2012  Patient/family informed of [facility]' ownership interest in [facility], as well as of the fact that they are under no obligation to receive care at this facility.  PASARR submitted to EDS on 06/22/2012 PASARR number received from EDS on 06/22/2012  FL2 transmitted to all facilities in geographic area requested by pt/family on  06/22/2012 FL2 transmitted to all facilities within larger geographic area on   Patient informed that his/her managed care company has contracts with or will negotiate with  certain facilities, including the following:   [facility] HMO     Patient/family informed of bed offers received:   06/22/2012 Patient chooses bed at [facility] Physician recommends and patient chooses bed at    Patient to be transferred to [facility] on  06/23/2012 Patient to be transferred to facility by [facility]  The following physician request were entered in Epic:   Additional Comments:   Unice Bailey, LCSW [facility] Clinical Social Worker cell #: [phone]

## 2012-06-23 NOTE — Progress Notes (Signed)
[facility] PROGRESS NOTE  [name] [name] ZOX:[id number] DOB: 1940/05/14 DOA: 06/16/2012 PCP: Ralene Ok, MD  Brief narrative 72 y.o. female, who had CVA initially 10 years ago and then last year, TIA this March all causing expressive aphasia, right sided weakness along with dysphagia, she was at that time deemed to be high risk for aspiration but patient chose no feeding tube and continued oral diet with known risk of aspirating. She has been noncompliant with her recommended diet at home per family as noted in the history and physical exam, she is being followed by home speech therapist on a regular basis at home. She was again admitted this time with an episode of Aspiration PNA.   Assessment/Plan: 1. Aspiration pneumonia: Patient has decided that she wants to continue oral diet being fully aware of the risk of recurrent aspiration pneumonia and respiratory failure. She has declined artificial means of nutrition. She wants to continue current diet as a measure of comfort. Palliative care team, speech therapist and ethics team consulted during this admission. She wishes to be treated for complications that arise from aspiration as long as it returns to her current acceptable state of life. She would not want aggressive care if she lost her ability to be mentally alert or if her survival would depend prolonged life support. Consider speech therapy followup and palliative care team follow up at SNF. Complete current course of antibiotics and recommend repeating chest x-ray in a couple of weeks to ensure resolution of pneumonia findings. Blood cultures are negative to date. 2. Dysphagia: Management as per problem #1. 3. CVA, recurrent, expressive aphasia and right hemiparesis: Continue Plavix. 4. Type 2 Diabetes Mellitus: Improved control on Lantus and NovoLog SSI. 5. Dyslipidemia: Continue statins. 6. Anemia: Likely chronic and stable. 7. Hypertension: Controlled. Continue metoprolol. 8. CAD,  status post CABG: Asymptomatic of chest pain. Continue Plavix, metoprolol and statins. 9. Hypokalemia: Corrected.    Code Status: Full Family Communication:  discussed directly with patient. Disposition Plan:  DC to SNF today.   Consultants:  Palliative care team  Speech therapy  Ethics committee  Procedures:  None   HPI/Subjective:  Patient is nonverbal. She communicates by writing with her left hand and indicates that she has some chest congestion but denies difficulty breathing or chest pain. Denies any other complaints  Objective: Filed Vitals:   06/22/12 0507 06/22/12 1426 06/22/12 2207 06/23/12 0618  BP: 142/55 132/73 118/53 129/73  Pulse: 63 82 62 67  Temp: 99.6 F (37.6 C) 99.7 F (37.6 C) 99.4 F (37.4 C) 100 F (37.8 C)  TempSrc: Oral Oral Oral Oral  Resp: 18 18 18 18   Height:      Weight:      SpO2: 100% 100% 100% 100%    Intake/Output Summary (Last 24 hours) at 06/23/12 1110 Last data filed at 06/23/12 0631  Gross per 24 hour  Intake    590 ml  Output   1300 ml  Net   -710 ml   Filed Weights   06/17/12 0033  Weight: 78.109 kg (172 lb 3.2 oz)    Exam:   General exam: Comfortable.  Respiratory system:  Reduced breath sounds in the bases with occasional basal crackles. Rest of lung fields clear to auscultation. No increased work of breathing.  Cardiovascular system: S1 & S2 heard, RRR. No JVD, murmurs, gallops, clicks or pedal edema. Telemetry shows sinus rhythm in the 60s-sinus bradycardia in the 50s   Gastrointestinal system: Abdomen is nondistended, soft and nontender. Normal  bowel sounds heard.  Central nervous system: Alert and oriented. Old expressive aphasia.   Extremities: Grade 0/5 power RUL and grade 2/5 power RLL. Left limbs grade 5/5 power.    Data Reviewed: Basic Metabolic Panel:  Recent Labs Lab 06/16/12 2045 06/17/12 0649 06/19/12 0716 06/21/12 0658 06/22/12 0705 06/23/12 0650  NA 133* 135 138 135  --   --   K  3.5 3.4* 4.0 3.2* 3.7  --   CL 96 100 104 100  --   --   CO2 23 25 23 27   --   --   GLUCOSE 369* 326* 153* 166*  --   --   BUN 14 13 10 8   --   --   CREATININE 0.97 0.92 0.89 0.82  --  0.80  CALCIUM 9.6 8.9 9.0 9.2  --   --   MG  --   --   --  2.0  --   --   PHOS  --   --   --  3.0  --   --    Liver Function Tests:  Recent Labs Lab 06/16/12 2045 06/17/12 0649  AST 27 25  ALT 27 25  ALKPHOS 72 60  BILITOT 0.4 0.4  PROT 7.8 6.8  ALBUMIN 2.9* 2.4*   No results found for this basename: LIPASE, AMYLASE,  in the last 168 hours No results found for this basename: AMMONIA,  in the last 168 hours CBC:  Recent Labs Lab 06/16/12 2045 06/17/12 0649 06/19/12 0716 06/21/12 0658  WBC 16.1* 11.3* 12.6* 9.4  NEUTROABS 12.0* 8.3*  --   --   HGB 11.7* 10.7* 10.7* 11.0*  HCT 36.0 33.2* 32.6* 33.4*  MCV 81.3 81.8 80.5 80.5  PLT 211 PLATELET CLUMPS NOTED ON SMEAR, COUNT APPEARS ADEQUATE 212 261   Cardiac Enzymes:  Recent Labs Lab 06/17/12 0050 06/17/12 0649 06/17/12 1300  TROPONINI <0.30 <0.30 <0.30   BNP (last 3 results) No results found for this basename: PROBNP,  in the last 8760 hours CBG:  Recent Labs Lab 06/22/12 1208 06/22/12 1808 06/22/12 2147 06/23/12 0003 06/23/12 0616  GLUCAP 239* 253* 180* 143* 110*    Recent Results (from the past 240 hour(s))  CULTURE, BLOOD (ROUTINE X 2)     Status: None   Collection Time    06/16/12  8:40 PM      Result Value Range Status   Specimen Description BLOOD LEFT ARM   Final   Special Requests BOTTLES DRAWN AEROBIC AND ANAEROBIC   Final   Culture  Setup Time 06/17/2012 06:59   Final   Culture     Final   Value:        BLOOD CULTURE RECEIVED NO GROWTH TO DATE CULTURE WILL BE HELD FOR 5 DAYS BEFORE ISSUING A FINAL NEGATIVE REPORT   Report Status PENDING   Incomplete  CULTURE, BLOOD (ROUTINE X 2)     Status: None   Collection Time    06/16/12  8:45 PM      Result Value Range Status   Specimen Description BLOOD LEFT ARM    Final   Special Requests BOTTLES DRAWN AEROBIC AND ANAEROBIC   Final   Culture  Setup Time 06/17/2012 06:58   Final   Culture     Final   Value:        BLOOD CULTURE RECEIVED NO GROWTH TO DATE CULTURE WILL BE HELD FOR 5 DAYS BEFORE ISSUING A FINAL NEGATIVE REPORT   Report Status  PENDING   Incomplete  URINE CULTURE     Status: None   Collection Time    06/16/12  9:40 PM      Result Value Range Status   Specimen Description URINE, CATHETERIZED   Final   Special Requests NONE   Final   Culture  Setup Time 06/17/2012 07:11   Final   [place] Count NO GROWTH   Final   Culture NO GROWTH   Final   Report Status 06/18/2012 FINAL   Final     Studies: No results found.   Additional labs:   Scheduled Meds: . antiseptic oral rinse  15 mL Mouth Rinse BID  . aspirin  150 mg Rectal Daily  . aztreonam  2 g Intravenous Q8H  . clopidogrel  75 mg Oral Daily  . enoxaparin (LOVENOX) injection  40 mg Subcutaneous Q24H  . escitalopram  20 mg Oral Daily  . insulin aspart  0-15 Units Subcutaneous Q6H  . insulin aspart  0-5 Units Subcutaneous QHS  . insulin glargine  20 Units Subcutaneous QHS  . metoprolol tartrate  25 mg Oral BID  . pantoprazole (PROTONIX) IV  40 mg Intravenous Q12H  . vancomycin  1,250 mg Intravenous Q12H   Continuous Infusions:   Active Problems:   Chest pain   HTN (hypertension)   Diabetes mellitus   Healthcare-associated pneumonia   Palliative care encounter   Dysphagia, unspecified    Time spent:  40 minutes    [facility]  [facility] Pager [phone].   If 8PM-8AM, please contact night-coverage at [email or web address], password [facility] 06/23/2012, 11:10 AM  LOS: 7 days

## 2012-07-22 NOTE — Consult Note (Signed)
I have reviewed and discussed the care of this patient in detail with the nurse practitioner including pertinent patient records, physical exam findings and data. I agree with details of this encounter.  

## 2013-01-29 ENCOUNTER — Emergency Department (HOSPITAL_COMMUNITY)
Admission: EM | Admit: 2013-01-29 | Discharge: 2013-01-30 | Disposition: A | Discharge status: Home / Self Care | Payer: Medicare HMO | Attending: Emergency Medicine | Admitting: Emergency Medicine

## 2013-01-29 ENCOUNTER — Encounter (HOSPITAL_COMMUNITY): Payer: Self-pay | Admitting: Emergency Medicine

## 2013-01-29 DIAGNOSIS — I2510 Atherosclerotic heart disease of native coronary artery without angina pectoris: Secondary | ICD-10-CM | POA: Insufficient documentation

## 2013-01-29 DIAGNOSIS — I209 Angina pectoris, unspecified: Secondary | ICD-10-CM | POA: Insufficient documentation

## 2013-01-29 DIAGNOSIS — Y939 Activity, unspecified: Secondary | ICD-10-CM | POA: Insufficient documentation

## 2013-01-29 DIAGNOSIS — X12XXXA Contact with other hot fluids, initial encounter: Secondary | ICD-10-CM | POA: Insufficient documentation

## 2013-01-29 DIAGNOSIS — Z880 Allergy status to penicillin: Secondary | ICD-10-CM | POA: Insufficient documentation

## 2013-01-29 DIAGNOSIS — Z79899 Other long term (current) drug therapy: Secondary | ICD-10-CM | POA: Insufficient documentation

## 2013-01-29 DIAGNOSIS — E119 Type 2 diabetes mellitus without complications: Secondary | ICD-10-CM | POA: Insufficient documentation

## 2013-01-29 DIAGNOSIS — Y929 Unspecified place or not applicable: Secondary | ICD-10-CM | POA: Insufficient documentation

## 2013-01-29 DIAGNOSIS — R5381 Other malaise: Secondary | ICD-10-CM | POA: Insufficient documentation

## 2013-01-29 DIAGNOSIS — Z95818 Presence of other cardiac implants and grafts: Secondary | ICD-10-CM | POA: Insufficient documentation

## 2013-01-29 DIAGNOSIS — Z8719 Personal history of other diseases of the digestive system: Secondary | ICD-10-CM | POA: Insufficient documentation

## 2013-01-29 DIAGNOSIS — I10 Essential (primary) hypertension: Secondary | ICD-10-CM | POA: Insufficient documentation

## 2013-01-29 DIAGNOSIS — I69959 Hemiplegia and hemiparesis following unspecified cerebrovascular disease affecting unspecified side: Secondary | ICD-10-CM | POA: Insufficient documentation

## 2013-01-29 DIAGNOSIS — Z7902 Long term (current) use of antithrombotics/antiplatelets: Secondary | ICD-10-CM | POA: Insufficient documentation

## 2013-01-29 DIAGNOSIS — I252 Old myocardial infarction: Secondary | ICD-10-CM | POA: Insufficient documentation

## 2013-01-29 DIAGNOSIS — E7800 Pure hypercholesterolemia, unspecified: Secondary | ICD-10-CM | POA: Insufficient documentation

## 2013-01-29 DIAGNOSIS — Z951 Presence of aortocoronary bypass graft: Secondary | ICD-10-CM | POA: Insufficient documentation

## 2013-01-29 DIAGNOSIS — T25221A Burn of second degree of right foot, initial encounter: Secondary | ICD-10-CM

## 2013-01-29 DIAGNOSIS — I69998 Other sequelae following unspecified cerebrovascular disease: Secondary | ICD-10-CM | POA: Insufficient documentation

## 2013-01-29 DIAGNOSIS — T25229A Burn of second degree of unspecified foot, initial encounter: Secondary | ICD-10-CM | POA: Insufficient documentation

## 2013-01-29 DIAGNOSIS — Z794 Long term (current) use of insulin: Secondary | ICD-10-CM | POA: Insufficient documentation

## 2013-01-29 MED ORDER — SILVER SULFADIAZINE 1 % EX CREA
TOPICAL_CREAM | Freq: Once | CUTANEOUS | Status: AC
  Administered 2013-01-29: 23:00:00 via TOPICAL
  Filled 2013-01-29: qty 85

## 2013-01-29 MED ORDER — SILVER SULFADIAZINE 1 % EX CREA: TOPICAL_CREAM | Freq: Two times a day (BID) | CUTANEOUS | Status: DC
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
## 2013-01-29 NOTE — ED Notes (Signed)
Awaiting PTAR to go home.
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
## 2013-06-10 ENCOUNTER — Ambulatory Visit
Admission: RE | Admit: 2013-06-10 | Discharge: 2013-06-10 | Disposition: A | Discharge status: Home / Self Care | Payer: Medicare HMO | Source: Ambulatory Visit | Attending: Internal Medicine | Admitting: Internal Medicine

## 2013-06-10 ENCOUNTER — Other Ambulatory Visit: Payer: Self-pay | Admitting: Internal Medicine

## 2013-06-10 DIAGNOSIS — R0602 Shortness of breath: Secondary | ICD-10-CM

## 2013-06-10 DIAGNOSIS — R059 Cough, unspecified: Secondary | ICD-10-CM

## 2013-06-10 DIAGNOSIS — R05 Cough: Secondary | ICD-10-CM

## 2013-07-12 ENCOUNTER — Other Ambulatory Visit: Payer: Self-pay | Admitting: Internal Medicine

## 2013-07-12 ENCOUNTER — Ambulatory Visit
Admission: RE | Admit: 2013-07-12 | Discharge: 2013-07-12 | Disposition: A | Discharge status: Home / Self Care | Payer: Medicare HMO | Source: Ambulatory Visit | Attending: Internal Medicine | Admitting: Internal Medicine

## 2013-07-12 DIAGNOSIS — Z1231 Encounter for screening mammogram for malignant neoplasm of breast: Secondary | ICD-10-CM

## 2013-07-12 DIAGNOSIS — IMO0002 Reserved for concepts with insufficient information to code with codable children: Secondary | ICD-10-CM

## 2013-07-12 DIAGNOSIS — R9389 Abnormal findings on diagnostic imaging of other specified body structures: Secondary | ICD-10-CM

## 2013-07-12 DIAGNOSIS — R229 Localized swelling, mass and lump, unspecified: Principal | ICD-10-CM

## 2013-07-12 DIAGNOSIS — R0602 Shortness of breath: Secondary | ICD-10-CM

## 2013-07-13 ENCOUNTER — Emergency Department (HOSPITAL_COMMUNITY): Payer: Medicare HMO

## 2013-07-13 ENCOUNTER — Encounter (HOSPITAL_COMMUNITY): Payer: Self-pay | Admitting: Emergency Medicine

## 2013-07-13 ENCOUNTER — Inpatient Hospital Stay (HOSPITAL_COMMUNITY)
Admission: EM | Admit: 2013-07-13 | Discharge: 2013-07-20 | DRG: 280 | Disposition: A | Discharge status: Skilled Nursing Facility | Payer: Medicare HMO | Attending: Cardiology | Admitting: Cardiology

## 2013-07-13 DIAGNOSIS — I214 Non-ST elevation (NSTEMI) myocardial infarction: Principal | ICD-10-CM

## 2013-07-13 DIAGNOSIS — E119 Type 2 diabetes mellitus without complications: Secondary | ICD-10-CM | POA: Diagnosis present

## 2013-07-13 DIAGNOSIS — R4181 Age-related cognitive decline: Secondary | ICD-10-CM | POA: Diagnosis present

## 2013-07-13 DIAGNOSIS — Z79899 Other long term (current) drug therapy: Secondary | ICD-10-CM

## 2013-07-13 DIAGNOSIS — E785 Hyperlipidemia, unspecified: Secondary | ICD-10-CM | POA: Diagnosis present

## 2013-07-13 DIAGNOSIS — I5043 Acute on chronic combined systolic (congestive) and diastolic (congestive) heart failure: Secondary | ICD-10-CM | POA: Diagnosis present

## 2013-07-13 DIAGNOSIS — D638 Anemia in other chronic diseases classified elsewhere: Secondary | ICD-10-CM | POA: Diagnosis present

## 2013-07-13 DIAGNOSIS — IMO0002 Reserved for concepts with insufficient information to code with codable children: Secondary | ICD-10-CM | POA: Diagnosis present

## 2013-07-13 DIAGNOSIS — Z951 Presence of aortocoronary bypass graft: Secondary | ICD-10-CM

## 2013-07-13 DIAGNOSIS — I428 Other cardiomyopathies: Secondary | ICD-10-CM | POA: Diagnosis present

## 2013-07-13 DIAGNOSIS — I10 Essential (primary) hypertension: Secondary | ICD-10-CM | POA: Diagnosis present

## 2013-07-13 DIAGNOSIS — I472 Ventricular tachycardia, unspecified: Secondary | ICD-10-CM | POA: Diagnosis not present

## 2013-07-13 DIAGNOSIS — I2510 Atherosclerotic heart disease of native coronary artery without angina pectoris: Secondary | ICD-10-CM | POA: Diagnosis present

## 2013-07-13 DIAGNOSIS — Z794 Long term (current) use of insulin: Secondary | ICD-10-CM

## 2013-07-13 DIAGNOSIS — I4729 Other ventricular tachycardia: Secondary | ICD-10-CM | POA: Diagnosis not present

## 2013-07-13 DIAGNOSIS — L8992 Pressure ulcer of unspecified site, stage 2: Secondary | ICD-10-CM | POA: Diagnosis present

## 2013-07-13 DIAGNOSIS — I5041 Acute combined systolic (congestive) and diastolic (congestive) heart failure: Secondary | ICD-10-CM | POA: Diagnosis present

## 2013-07-13 DIAGNOSIS — I252 Old myocardial infarction: Secondary | ICD-10-CM

## 2013-07-13 DIAGNOSIS — I509 Heart failure, unspecified: Secondary | ICD-10-CM | POA: Diagnosis present

## 2013-07-13 DIAGNOSIS — I69959 Hemiplegia and hemiparesis following unspecified cerebrovascular disease affecting unspecified side: Secondary | ICD-10-CM

## 2013-07-13 DIAGNOSIS — I69920 Aphasia following unspecified cerebrovascular disease: Secondary | ICD-10-CM

## 2013-07-13 DIAGNOSIS — R079 Chest pain, unspecified: Secondary | ICD-10-CM

## 2013-07-13 DIAGNOSIS — L89899 Pressure ulcer of other site, unspecified stage: Secondary | ICD-10-CM | POA: Diagnosis present

## 2013-07-13 DIAGNOSIS — Z8249 Family history of ischemic heart disease and other diseases of the circulatory system: Secondary | ICD-10-CM

## 2013-07-13 DIAGNOSIS — E876 Hypokalemia: Secondary | ICD-10-CM | POA: Diagnosis not present

## 2013-07-13 DIAGNOSIS — L84 Corns and callosities: Secondary | ICD-10-CM | POA: Diagnosis present

## 2013-07-13 LAB — HEPARIN LEVEL (UNFRACTIONATED): HEPARIN UNFRACTIONATED: 0.12 [IU]/mL — AB (ref 0.30–0.70)

## 2013-07-13 LAB — CBC WITH DIFFERENTIAL/PLATELET
BASOS ABS: 0 10*3/uL (ref 0.0–0.1)
Basophils Relative: 0 % (ref 0–1)
Eosinophils Absolute: 0.1 10*3/uL (ref 0.0–0.7)
Eosinophils Relative: 2 % (ref 0–5)
HCT: 36 % (ref 36.0–46.0)
Hemoglobin: 11.1 g/dL — ABNORMAL LOW (ref 12.0–15.0)
LYMPHS ABS: 1.5 10*3/uL (ref 0.7–4.0)
LYMPHS PCT: 25 % (ref 12–46)
MCH: 25.8 pg — ABNORMAL LOW (ref 26.0–34.0)
MCHC: 30.8 g/dL (ref 30.0–36.0)
MCV: 83.7 fL (ref 78.0–100.0)
Monocytes Absolute: 0.6 10*3/uL (ref 0.1–1.0)
Monocytes Relative: 11 % (ref 3–12)
NEUTROS ABS: 3.5 10*3/uL (ref 1.7–7.7)
NEUTROS PCT: 62 % (ref 43–77)
PLATELETS: 198 10*3/uL (ref 150–400)
RBC: 4.3 MIL/uL (ref 3.87–5.11)
RDW: 14.7 % (ref 11.5–15.5)
WBC: 5.8 10*3/uL (ref 4.0–10.5)

## 2013-07-13 LAB — HEMOGLOBIN A1C
Hgb A1c MFr Bld: 7 % — ABNORMAL HIGH (ref <5.7)
MEAN PLASMA GLUCOSE: 154 mg/dL — AB (ref <117)

## 2013-07-13 LAB — MAGNESIUM: Magnesium: 1.8 mg/dL (ref 1.5–2.5)

## 2013-07-13 LAB — URINALYSIS, ROUTINE W REFLEX MICROSCOPIC
Bilirubin Urine: NEGATIVE
Glucose, UA: NEGATIVE mg/dL
Hgb urine dipstick: NEGATIVE
KETONES UR: NEGATIVE mg/dL
LEUKOCYTES UA: NEGATIVE
NITRITE: NEGATIVE
PH: 6.5 (ref 5.0–8.0)
Protein, ur: 30 mg/dL — AB
SPECIFIC GRAVITY, URINE: 1.009 (ref 1.005–1.030)
Urobilinogen, UA: 1 mg/dL (ref 0.0–1.0)

## 2013-07-13 LAB — TROPONIN I
TROPONIN I: 3.72 ng/mL — AB (ref <0.30)
Troponin I: 3.6 ng/mL (ref <0.30)

## 2013-07-13 LAB — GLUCOSE, CAPILLARY
GLUCOSE-CAPILLARY: 149 mg/dL — AB (ref 70–99)
Glucose-Capillary: 212 mg/dL — ABNORMAL HIGH (ref 70–99)

## 2013-07-13 LAB — URINE MICROSCOPIC-ADD ON

## 2013-07-13 LAB — I-STAT TROPONIN, ED: TROPONIN I, POC: 3.38 ng/mL — AB (ref 0.00–0.08)

## 2013-07-13 LAB — BASIC METABOLIC PANEL
BUN: 15 mg/dL (ref 6–23)
CALCIUM: 9 mg/dL (ref 8.4–10.5)
CO2: 25 meq/L (ref 19–32)
Chloride: 107 mEq/L (ref 96–112)
Creatinine, Ser: 0.9 mg/dL (ref 0.50–1.10)
GFR calc Af Amer: 72 mL/min — ABNORMAL LOW (ref >90)
GFR calc non Af Amer: 62 mL/min — ABNORMAL LOW (ref >90)
GLUCOSE: 218 mg/dL — AB (ref 70–99)
POTASSIUM: 4.1 meq/L (ref 3.7–5.3)
SODIUM: 145 meq/L (ref 137–147)

## 2013-07-13 LAB — PROTIME-INR
INR: 1.27 (ref 0.00–1.49)
Prothrombin Time: 15.6 seconds — ABNORMAL HIGH (ref 11.6–15.2)

## 2013-07-13 LAB — APTT: aPTT: 101 seconds — ABNORMAL HIGH (ref 24–37)

## 2013-07-13 LAB — CBG MONITORING, ED: GLUCOSE-CAPILLARY: 182 mg/dL — AB (ref 70–99)

## 2013-07-13 LAB — PRO B NATRIURETIC PEPTIDE: PRO B NATRI PEPTIDE: 4949 pg/mL — AB (ref 0–125)

## 2013-07-13 LAB — MRSA PCR SCREENING: MRSA by PCR: NEGATIVE

## 2013-07-13 MED ORDER — FUROSEMIDE 10 MG/ML IJ SOLN
40.0000 mg | Freq: Two times a day (BID) | INTRAMUSCULAR | Status: DC
  Administered 2013-07-13 – 2013-07-14 (×2): 40 mg via INTRAVENOUS
  Filled 2013-07-13 (×4): qty 4

## 2013-07-13 MED ORDER — SILVER SULFADIAZINE 1 % EX CREA
TOPICAL_CREAM | Freq: Two times a day (BID) | CUTANEOUS | Status: DC
  Administered 2013-07-13: 1 via TOPICAL
  Administered 2013-07-13 – 2013-07-15 (×4): via TOPICAL
  Administered 2013-07-16: 1 via TOPICAL
  Administered 2013-07-16: 11:00:00 via TOPICAL
  Administered 2013-07-17 – 2013-07-18 (×2): 1 via TOPICAL
  Administered 2013-07-19 – 2013-07-20 (×3): via TOPICAL
  Filled 2013-07-13: qty 85

## 2013-07-13 MED ORDER — ASPIRIN 81 MG PO CHEW: 324.0000 mg | CHEWABLE_TABLET | ORAL | Status: DC

## 2013-07-13 MED ORDER — NITROGLYCERIN 0.4 MG SL SUBL: 0.4000 mg | SUBLINGUAL_TABLET | SUBLINGUAL | Status: DC | PRN

## 2013-07-13 MED ORDER — METOPROLOL TARTRATE 25 MG PO TABS
25.0000 mg | ORAL_TABLET | Freq: Two times a day (BID) | ORAL | Status: DC
  Administered 2013-07-13 – 2013-07-20 (×11): 25 mg via ORAL
  Filled 2013-07-13 (×15): qty 1

## 2013-07-13 MED ORDER — ASPIRIN EC 81 MG PO TBEC
81.0000 mg | DELAYED_RELEASE_TABLET | Freq: Every day | ORAL | Status: DC
  Administered 2013-07-14 – 2013-07-20 (×6): 81 mg via ORAL
  Filled 2013-07-13 (×7): qty 1

## 2013-07-13 MED ORDER — ONDANSETRON HCL 4 MG/2ML IJ SOLN: 4.0000 mg | Freq: Four times a day (QID) | INTRAMUSCULAR | Status: DC | PRN

## 2013-07-13 MED ORDER — INSULIN ASPART 100 UNIT/ML ~~LOC~~ SOLN
0.0000 [IU] | Freq: Three times a day (TID) | SUBCUTANEOUS | Status: DC
  Administered 2013-07-14 – 2013-07-16 (×5): 3 [IU] via SUBCUTANEOUS
  Administered 2013-07-16: 11 [IU] via SUBCUTANEOUS
  Administered 2013-07-17: 5 [IU] via SUBCUTANEOUS
  Administered 2013-07-18 (×3): 3 [IU] via SUBCUTANEOUS
  Administered 2013-07-19: 2 [IU] via SUBCUTANEOUS
  Administered 2013-07-19 – 2013-07-20 (×2): 3 [IU] via SUBCUTANEOUS
  Administered 2013-07-20: 5 [IU] via SUBCUTANEOUS

## 2013-07-13 MED ORDER — ASPIRIN 300 MG RE SUPP: 300.0000 mg | RECTAL | Status: DC

## 2013-07-13 MED ORDER — CHOLECALCIFEROL 10 MCG (400 UNIT) PO TABS
400.0000 [IU] | ORAL_TABLET | Freq: Every day | ORAL | Status: DC
  Administered 2013-07-13 – 2013-07-20 (×7): 400 [IU] via ORAL
  Filled 2013-07-13 (×8): qty 1

## 2013-07-13 MED ORDER — OXYCODONE HCL 5 MG PO TABS
5.0000 mg | ORAL_TABLET | ORAL | Status: DC | PRN
  Administered 2013-07-13: 5 mg via ORAL
  Filled 2013-07-13: qty 1

## 2013-07-13 MED ORDER — NITROGLYCERIN IN D5W 200-5 MCG/ML-% IV SOLN
2.0000 ug/min | INTRAVENOUS | Status: DC
  Filled 2013-07-13: qty 250

## 2013-07-13 MED ORDER — INSULIN GLARGINE 100 UNIT/ML ~~LOC~~ SOLN
20.0000 [IU] | Freq: Every day | SUBCUTANEOUS | Status: DC
  Administered 2013-07-14 – 2013-07-19 (×6): 20 [IU] via SUBCUTANEOUS
  Filled 2013-07-13 (×8): qty 0.2

## 2013-07-13 MED ORDER — HEPARIN BOLUS VIA INFUSION
4000.0000 [IU] | Freq: Once | INTRAVENOUS | Status: AC
  Administered 2013-07-13: 4000 [IU] via INTRAVENOUS
  Filled 2013-07-13: qty 4000

## 2013-07-13 MED ORDER — CLOPIDOGREL BISULFATE 75 MG PO TABS
75.0000 mg | ORAL_TABLET | Freq: Every day | ORAL | Status: DC
  Administered 2013-07-14 – 2013-07-19 (×5): 75 mg via ORAL
  Filled 2013-07-13 (×7): qty 1

## 2013-07-13 MED ORDER — INSULIN ASPART 100 UNIT/ML ~~LOC~~ SOLN
11.0000 [IU] | Freq: Three times a day (TID) | SUBCUTANEOUS | Status: DC
  Administered 2013-07-13 – 2013-07-20 (×17): 11 [IU] via SUBCUTANEOUS

## 2013-07-13 MED ORDER — POTASSIUM CHLORIDE CRYS ER 10 MEQ PO TBCR
10.0000 meq | EXTENDED_RELEASE_TABLET | Freq: Two times a day (BID) | ORAL | Status: DC
  Administered 2013-07-13 – 2013-07-17 (×7): 10 meq via ORAL
  Filled 2013-07-13 (×9): qty 1

## 2013-07-13 MED ORDER — HYDROCHLOROTHIAZIDE 25 MG PO TABS
25.0000 mg | ORAL_TABLET | Freq: Every day | ORAL | Status: DC
  Filled 2013-07-13 (×2): qty 1

## 2013-07-13 MED ORDER — ACETAMINOPHEN 325 MG PO TABS
650.0000 mg | ORAL_TABLET | ORAL | Status: DC | PRN
  Administered 2013-07-14: 650 mg via ORAL
  Filled 2013-07-13: qty 2

## 2013-07-13 MED ORDER — ATORVASTATIN CALCIUM 80 MG PO TABS
80.0000 mg | ORAL_TABLET | Freq: Every day | ORAL | Status: DC
  Administered 2013-07-13 – 2013-07-19 (×4): 80 mg via ORAL
  Filled 2013-07-13 (×8): qty 1

## 2013-07-13 MED ORDER — DOPAMINE-DEXTROSE 3.2-5 MG/ML-% IV SOLN
2.0000 ug/kg/min | INTRAVENOUS | Status: DC
  Administered 2013-07-13: 2.5 ug/kg/min via INTRAVENOUS
  Filled 2013-07-13: qty 250

## 2013-07-13 MED ORDER — HEPARIN (PORCINE) IN NACL 100-0.45 UNIT/ML-% IJ SOLN
1200.0000 [IU]/h | INTRAMUSCULAR | Status: DC
  Administered 2013-07-13: 1000 [IU]/h via INTRAVENOUS
  Administered 2013-07-13: 850 [IU]/h via INTRAVENOUS
  Administered 2013-07-14 – 2013-07-15 (×3): 1200 [IU]/h via INTRAVENOUS
  Filled 2013-07-13 (×5): qty 250

## 2013-07-13 NOTE — Consult Note (Signed)
WOC wound consult note Reason for Consult: Consult requested for right foot wound.  Pt unable to speak at this time but wound appears to be chronic, of unknown etiology, and there is a previous order for Silvadene in the med requisition list.   Wound type: Left heel with 3X3cm deep tissue injury with intact skin over 50% clear fluid, 50% dark red fluid-filled blister.  No odor or drainage. Inner left foot with dry intact callous, 1.5X1.5cm.  No odor, drainage, or open wound.  No topical treatment indicated for either site; float left foot to reduce pressure to heel. Pressure Ulcer POA: Yes Right anterior foot with full thickness wound; 5X4.5X1.2cm; 90% yellow wound bed, 10% red.  Small amt yellow drainage, no odor.  Pink dry scar tissue to wound edges. Right upper foot with healing full thickness wound; .8X1.2X.1cm, 90% pink dry scar tissue, 10% red and moist.  No odor or drainage. Right outer ankle with stage 2 wound; intact clear fluid-filled blister with intact skin.  No topical treatment needed at this time; present on admission. Dressing procedure/placement/frequency: Continue present plan of care with Silvadene to right anterior foot wound.  Foam dressing to upper right foot wound. Pt can resume follow-up with outpatient physician after discharge. Please re-consult if further assistance is needed.  Thank-you,  Julien Girt MSN, [place], [place], [place], [place]

## 2013-07-13 NOTE — ED Notes (Signed)
Report given to Elaina, RN 

## 2013-07-13 NOTE — H&P (Addendum)
[name] [name] is an 73 y.o. female.   Chief Complaint: Chest pain HPI: Patient is a 73 year old African American female with history of remote dense right hemiparesis, diabetes mellitus, hypertension, history of known coronary artery disease and remote CABG, who I had last seen in January of 2013, when she presented with non-ST elevation myocardial infarction. At that time coronary angiography revealed mild progression of disease, was recommended medical therapy. I had not seen her since then.  She presented to the emergency room complaining about chest pain ongoing for the past one month. Patient's symptoms have been worse over the past 2 days, due to recurrent chest pain especially yesterday and today she presented to the emergency room where on evaluation, she was found to have positive troponins. She has also been complaining shortness of breath and dyspnea on exertion which has been getting worse for the past 2 weeks.  Over the past 1 month patient has been treated for pneumonia in the outpatient basis with antibiotics. Patient is nonverbal, however she's a retired Radio producer, able to communicate by writing and also able to make grunting noises when questions are asked. Her daughter is present at the bedside. Patient denies any other associated symptoms. Denies any dark stools, denies fever, but has noticed that she's been coughing fairly frequently and occasionally bringing up yellowish sputum. No fever or chills.  She is also developed ulcerations in her feet over the past few weeks to few months. Patient lives at home and is cared for by her daughter.  Past Medical History  Diagnosis Date  . 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 Communicates by writing   . Hypertension   . Hypercholesteremia   . Complication of anesthesia     lung   . Angina   . GERD (gastroesophageal reflux disease)   . Myocardial infarction     CABG 2001, ? stent. Cath 02/2011 with new  distal LIMA-LAD 80%. SVG-OM occlusion is old, rest of grafts patent  . Peripheral vascular disease   . Diabetes mellitus   . H/O hiatal hernia     Past Surgical History  Procedure Laterality Date  . Coronary artery bypass graft    . Abdominal hysterectomy    . Breast biopsy    . Esophagogastroduodenoscopy  03/12/2011    Procedure: ESOPHAGOGASTRODUODENOSCOPY (EGD);  Surgeon: [name], MD;  Location: [facility] ENDOSCOPY;  Service: Endoscopy;  Laterality: N/A;  . Cardiac catheterization  02/2011    Family History  Problem Relation Age of Onset  . Anesthesia problems Neg Hx   . Hypotension Neg Hx   . Malignant hyperthermia Neg Hx   . Pseudochol deficiency Neg Hx   . Heart disease Mother   . Stroke Father   . Diabetes Sister   . Diabetes Brother    Social History:  reports that she has never smoked. She does not have any smokeless tobacco history on file. She reports that she does not drink alcohol or use illicit drugs.  Allergies:  Allergies  Allergen Reactions  . Penicillins Swelling    Swelling of mouth    Medications Prior to Admission  Medication Sig Dispense Refill  . Cholecalciferol (VITAMIN D PO) Take 1 tablet by mouth daily.      . clopidogrel (PLAVIX) 75 MG tablet Take 75 mg by mouth daily.        Marland Kitchen escitalopram (LEXAPRO) 20 MG tablet Take 20 mg by mouth daily.      . furosemide (LASIX)  20 MG tablet Take 20 mg by mouth daily.      . hydrochlorothiazide (HYDRODIURIL) 25 MG tablet Take 25 mg by mouth daily.        . insulin aspart (NOVOLOG) 100 UNIT/ML injection Inject 11 Units into the skin 3 (three) times daily with meals.      . insulin glargine (LANTUS) 100 UNIT/ML injection Inject 20 Units into the skin at bedtime.      . nitroGLYCERIN (NITROSTAT) 0.4 MG SL tablet Place 0.4 mg under the tongue every 5 (five) minutes as needed for chest pain.      Marland Kitchen oxyCODONE (OXY IR/ROXICODONE) 5 MG immediate release tablet Take 5 mg by mouth every 4 (four) hours as needed for  severe pain.      . rosuvastatin (CRESTOR) 40 MG tablet Take 40 mg by mouth daily.      . silver sulfADIAZINE (SILVADENE) 1 % cream Apply 1 application topically 2 (two) times daily. Apply with clean bandages two times daily to right foot          Review of Systems - she denies any significant recent weight changes. She has had episodes of crying and laughing and thinks that she may have depression. She is diabetic and her sugars are controlled. Frequent cough is evident. Worsening shortness of breath and orthopnea. Has leg edema. Other systems negative.  Blood pressure 139/78, pulse 100, temperature 98.2 F (36.8 C), temperature source Oral, resp. rate 20, height 5' 5"  (1.651 m), weight 81.647 kg (180 lb), SpO2 100.00%. General appearance: alert, cooperative, appears stated age and mild distress Eyes: negative findings: lids and lashes normal Neck: supple, symmetrical, trachea midline and Right facial droop evident. Neck: JVP - elevated.  carotids 2+= without bruits Resp: Right base decreased breath sounds, coarse crackles are heard. Chest wall: no tenderness Cardio: S1, S2 normal, S3 present, no click, no rub and No murmur GI: soft, non-tender; bowel sounds normal; no masses,  no organomegaly Extremities: edema 2-3+ bilateral below-knee pitting edema there is skin breakdown of both feet. Pulses: Bilateral carotid arteries normal, femoral pulses normal, popliteal pulse and pedal pulse absent. Skin: Skin breakdown of both feet evident. Has decub ulcer in her right ankle and dorsal foot on the left. Neurologic: Patient is alert oriented x3, patient has dense hemiplegia. Patient is aphakic. But able to communicate a by hand gestures and grunting.  Results for orders placed during the hospital encounter of 07/13/13 (from the past 48 hour(s))  CBC WITH DIFFERENTIAL     Status: Abnormal   Collection Time    07/13/13 10:33 AM      Result Value Ref Range   WBC 5.8  4.0 - 10.5 K/uL   RBC 4.30   3.87 - 5.11 MIL/uL   Hemoglobin 11.1 (*) 12.0 - 15.0 g/dL   HCT 36.0  36.0 - 46.0 %   MCV 83.7  78.0 - 100.0 fL   MCH 25.8 (*) 26.0 - 34.0 pg   MCHC 30.8  30.0 - 36.0 g/dL   RDW 14.7  11.5 - 15.5 %   Platelets 198  150 - 400 K/uL   Neutrophils Relative % 62  43 - 77 %   Neutro Abs 3.5  1.7 - 7.7 K/uL   Lymphocytes Relative 25  12 - 46 %   Lymphs Abs 1.5  0.7 - 4.0 K/uL   Monocytes Relative 11  3 - 12 %   Monocytes Absolute 0.6  0.1 - 1.0 K/uL   Eosinophils Relative  2  0 - 5 %   Eosinophils Absolute 0.1  0.0 - 0.7 K/uL   Basophils Relative 0  0 - 1 %   Basophils Absolute 0.0  0.0 - 0.1 K/uL  BASIC METABOLIC PANEL     Status: Abnormal   Collection Time    07/13/13 10:33 AM      Result Value Ref Range   Sodium 145  137 - 147 mEq/L   Potassium 4.1  3.7 - 5.3 mEq/L   Chloride 107  96 - 112 mEq/L   CO2 25  19 - 32 mEq/L   Glucose, Bld 218 (*) 70 - 99 mg/dL   BUN 15  6 - 23 mg/dL   Creatinine, Ser 0.90  0.50 - 1.10 mg/dL   Calcium 9.0  8.4 - 10.5 mg/dL   GFR calc non Af Amer 62 (*) >90 mL/min   GFR calc Af Amer 72 (*) >90 mL/min   Comment: (NOTE)     The eGFR has been calculated using the CKD EPI equation.     This calculation has not been validated in all clinical situations.     eGFR's persistently <90 mL/min signify possible Chronic Kidney     Disease.  PRO B NATRIURETIC PEPTIDE     Status: Abnormal   Collection Time    07/13/13 10:33 AM      Result Value Ref Range   Pro B Natriuretic peptide (BNP) 4949.0 (*) 0 - 125 pg/mL  I-STAT TROPOININ, ED     Status: Abnormal   Collection Time    07/13/13 11:12 AM      Result Value Ref Range   Troponin i, poc 3.38 (*) 0.00 - 0.08 ng/mL   Comment NOTIFIED PHYSICIAN     Comment 3            Comment: Due to the release kinetics of cTnI,     a negative result within the first hours     of the onset of symptoms does not rule out     myocardial infarction with certainty.     If myocardial infarction is still suspected,     repeat the  test at appropriate intervals.  CBG MONITORING, ED     Status: Abnormal   Collection Time    07/13/13 11:46 AM      Result Value Ref Range   Glucose-Capillary 182 (*) 70 - 99 mg/dL  APTT     Status: Abnormal   Collection Time    07/13/13  2:30 PM      Result Value Ref Range   aPTT 101 (*) 24 - 37 seconds   Comment:            IF BASELINE aPTT IS ELEVATED,     SUGGEST PATIENT RISK ASSESSMENT     BE USED TO DETERMINE APPROPRIATE     ANTICOAGULANT THERAPY.  MAGNESIUM     Status: None   Collection Time    07/13/13  2:30 PM      Result Value Ref Range   Magnesium 1.8  1.5 - 2.5 mg/dL  PROTIME-INR     Status: Abnormal   Collection Time    07/13/13  2:30 PM      Result Value Ref Range   Prothrombin Time 15.6 (*) 11.6 - 15.2 seconds   INR 1.27  0.00 - 1.49  GLUCOSE, CAPILLARY     Status: Abnormal   Collection Time    07/13/13  3:59 PM  Result Value Ref Range   Glucose-Capillary 149 (*) 70 - 99 mg/dL  TROPONIN I     Status: Abnormal   Collection Time    07/13/13  4:25 PM      Result Value Ref Range   Troponin I 3.60 (*) <0.30 ng/mL   Comment:            Due to the release kinetics of cTnI,     a negative result within the first hours     of the onset of symptoms does not rule out     myocardial infarction with certainty.     If myocardial infarction is still suspected,     repeat the test at appropriate intervals.     CRITICAL RESULT CALLED TO, READ BACK BY AND VERIFIED WITH:     E MOSQUENDA,RN 1758 07/13/13 WBOND   Dg Chest 2 View  07/13/2013   CLINICAL DATA:  Weakness, chest pain  EXAM: CHEST  2 VIEW  COMPARISON:  DG CHEST 2 VIEW dated 07/12/2013  FINDINGS: Sternotomy wires overlie normal cardiac silhouette. Mild improvement in central venous pulmonary congestion pattern. Smaller pleural fluid on the right. No pneumothorax. No focal consolidation.  IMPRESSION: Improvement in central venous pulmonary congestion.  Small right effusion.   Electronically Signed   By: Suzy Bouchard M.D.   On: 07/13/2013 12:03   Dg Chest 2 View  07/12/2013   CLINICAL DATA:  Shortness of breath, cough, chest pain  EXAM: CHEST  2 VIEW  COMPARISON:  Chest x-ray of 06/10/2013  FINDINGS: There is moderate cardiomegaly present with interstitial edema and small effusions consistent with mild congestive heart failure. Median sternotomy sutures are noted from prior CABG. There is soft tissue opacity within the retrosternal airspace just anterior to the ascending aorta of questionable significance. If further assessment is warranted CT angiogram of the chest is recommended. And old lower thoracic compression deformity is noted.  IMPRESSION: 1. Cardiomegaly and interstitial edema with small effusions. 2. Soft tissue opacity in the retrosternal airspace of uncertain significance which is new. Consider CTA of the chest.   Electronically Signed   By: Ivar Drape M.D.   On: 07/12/2013 13:44    Labs:   Lab Results  Component Value Date   WBC 5.8 07/13/2013   HGB 11.1* 07/13/2013   HCT 36.0 07/13/2013   MCV 83.7 07/13/2013   PLT 198 07/13/2013    Recent Labs Lab 07/13/13 1033  NA 145  K 4.1  CL 107  CO2 25  BUN 15  CREATININE 0.90  CALCIUM 9.0  GLUCOSE 218*   Lab Results  Component Value Date   CKTOTAL 124 07/25/2011   CKMB 3.1 07/25/2011   TROPONINI 3.60* 07/13/2013    Lipid Panel     Component Value Date/Time   CHOL 106 06/22/2012 0705   TRIG 94 06/22/2012 0705   HDL 11* 06/22/2012 0705   CHOLHDL 9.6 06/22/2012 0705   VLDL 19 06/22/2012 0705   LDLCALC 76 06/22/2012 0705    EKG: Normal sinus rhythm, normal axis. Inferior and lateral ST segment depression with nonspecific T. abnormality, cannot exclude inferior and lateral ischemia. Compared to prior EKG from 2014, ST segment changes are minimally worse. Cardiac cath 2013: new lesion distal to LIMA insertion of the LAD which is about 80%. However this can be treated medically. SVG to RCA, LIMA to LAD, are patent. SVG to OM is occluded and  this is old.  Echocardiogram 06/21/2012: Normal left systolic function, ejection  fraction 55-60%. Assessment/Plan 1. Non-ST elevation myocardial infarction 2. Coronary artery disease, history of CABG 3. 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 Abnormal CT scan of the chest revealing retrosternal shadowing, nonspecific. 5. Diabetes mellitus type 2 controlled 6. Shortness of breath and dyspnea on exertion, probably due to acute systolic and diastolic heart failure by physical exam, echocardiogram that was performed on 06/21/2012 had revealed preserved ejection fraction. She has a very prominent S3 gallop, bibasilar faint crackles on physical exam, clinical findings consistent with systolic and diastolic heart failure.  Recommendation: Patient is extremely ill, I have updated the status with the patient's daughter who is present the bedside. Patient wants to be full code.  I do not think that she can undergo coronary angiography, patient is short of breath and in mild respiratory distress and has significant orthopnea. I also cannot exclude presence of aspiration pneumonia right lung as she always sleeps on the right side, the stroke affected side. She has been treated multiple times with antibiotics in the outpatient basis, hence may not be able to see significant white count jumped and she probably is immunocompromised due to her age and comorbidities.  I was transferred to step down unit, start her on IV nitroglycerin, continue to trend the troponins. I have started her on a low-dose of beta blocker. I will obtain an echocardiogram in the morning.  [name], MD 07/13/2013, 6:29 PM [facility]. [place] Pager: [phone] Office: [phone] If no answer: Cell:  [phone]

## 2013-07-13 NOTE — ED Notes (Signed)
Results of troponin given to Dr. Vanita Panda

## 2013-07-13 NOTE — Progress Notes (Signed)
ANTICOAGULATION CONSULT NOTE - Initial Consult  Pharmacy Consult for Heparin Indication: chest pain/ACS  Allergies  Allergen Reactions  . Penicillins Swelling    Swelling of mouth    Patient Measurements: Height: 5\' 5"  (165.1 cm) Weight: 201 lb 8 oz (91.4 kg) IBW/kg (Calculated) : 57 Heparin Dosing Weight: 74 kg  Vital Signs: Temp: 98.4 F (36.9 C) (05/20 2030) Temp src: Oral (05/20 2030) BP: 99/57 mmHg (05/20 2155) Pulse Rate: 72 (05/20 2155)  Labs:  Recent Labs  07/13/13 1033 07/13/13 1430 07/13/13 1625 07/13/13 2100  HGB 11.1*  --   --   --   HCT 36.0  --   --   --   PLT 198  --   --   --   APTT  --  101*  --   --   LABPROT  --  15.6*  --   --   INR  --  1.27  --   --   HEPARINUNFRC  --   --   --  0.12*  CREATININE 0.90  --   --   --   TROPONINI  --   --  3.60* 3.72*    Estimated Creatinine Clearance: 63.2 ml/min (by C-G formula based on Cr of 0.9).   Medical History: Past Medical History  Diagnosis Date  . 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 Communicates by writing   . Hypertension   . Hypercholesteremia   . Complication of anesthesia     lung   . Angina   . GERD (gastroesophageal reflux disease)   . Myocardial infarction     CABG 2001, ? stent. Cath 02/2011 with new distal LIMA-LAD 80%. SVG-OM occlusion is old, rest of grafts patent  . Peripheral vascular disease   . Diabetes mellitus   . H/O hiatal hernia     Assessment: [name] who presented to the [facility] with CP and elevated troponins (first set 3.38). Pharmacy consulted to start heparin for anticoagulation while awaiting further cardiology work-up.  The patient is noted to have prior CVAs in '02 and '03 with residual weakness and aphasia. Baseline CBC wnl except Hgb slightly low at 11.1. The patient was on plavix PTA was secondary stoke prophylaxis - no other blood thinners noted.   Heparin drip started 850 uts/hr HL 0.12 < goal.  No bleeding noted.     Goal of Therapy:  Heparin level 0.3-0.7 units/ml Monitor platelets by anticoagulation protocol: Yes   Plan:  Increase Heparin drip 1000 uts/hr  Daily HL, CBC  Bonnita Nasuti Pharm.D. CPP, BCPS Clinical Pharmacist [phone] 07/13/2013 10:12 PM

## 2013-07-13 NOTE — Progress Notes (Signed)
CRITICAL VALUE ALERT  Critical value received:  Troponin=3.60  Date of notification:  07/13/2013  Time of notification:  3614  Critical value read back:yes  Nurse who received alert:  Neta Ehlers, RN   MD notified (1st page):  Dr. Einar Gip  Time of first page:  1810  MD notified (2nd page):  Time of second page:  Responding MD:  Einar Gip  Time MD responded:  [phone]

## 2013-07-13 NOTE — Progress Notes (Signed)
Md notified about pts SBP 70's. New orders received. Will continue to monitor.Wynona Canes

## 2013-07-13 NOTE — Progress Notes (Signed)
2 rns attempt  2nd iv site unable.  Will continue to monitor. [name] [name]

## 2013-07-13 NOTE — ED Provider Notes (Signed)
  This was a shared visit with a mid-level provided (NP or PA).  Throughout the patient's course I was available for consultation/collaboration.  I saw the ECG ( EKG has sinus rhythm, rate 77, left ventricular hypertrophy, artifacts, prolonged QT, abnormal ), relevant labs and studies - I agree with the interpretation.  On my exam the patient was uncomfortable appearing.  With concern for chest pain, patient had labs in addition to radiographic studies his labs were immediately notable for elevated troponin.  Patient was here recently, had troponin within normal range. Upon return from x-ray, which I reviewed, the interpretation, the patient was started on a heparin drip for NSTEMI. Patient's BNP was also elevated, and given this combination of some concern for demand ischemia, though patient has echocardiogram from one year ago with essentially normal ejection fraction.  On re-exam after initiation of med she remained uncomfortable appearing.  CRITICAL CARE Performed by: Carmin Muskrat Total critical care time: 35 Critical care time was exclusive of separately billable procedures and treating other patients. Critical care was necessary to treat or prevent imminent or life-threatening deterioration. Critical care was time spent personally by me on the following activities: development of treatment plan with patient and/or surrogate as well as nursing, discussions with consultants, evaluation of patient's response to treatment, examination of patient, obtaining history from patient or surrogate, ordering and performing treatments and interventions, ordering and review of laboratory studies, ordering and review of radiographic studies, pulse oximetry and re-evaluation of patient's condition.      Carmin Muskrat, MD 07/13/13 [phone]

## 2013-07-13 NOTE — ED Notes (Signed)
Dx w/ pneu x 2 weeks ago went to pcp yesterday and was placed on 2nd round antiobiotics today had some sob hurts worse when she moves and coughs

## 2013-07-13 NOTE — ED Provider Notes (Signed)
CSN: [id number]     Arrival date & time 07/13/13  1022 History   First MD Initiated Contact with Patient 07/13/13 1023     Chief Complaint  Patient presents with  . Shortness of Breath     (Consider location/radiation/quality/duration/timing/severity/associated sxs/prior Treatment) HPI Comments: Nonverbal (from prior stroke), patient with history of MI presents to the emergency department with chief complaint of cough and shortness of breath. She states that she was diagnosed with pneumonia 2 weeks ago. She's been taking antibiotics, but states that her symptoms have been worsening. She started a second course of antibiotics yesterday. She states that she feels short of breath, and has chest pain with movement. The pain does not radiate.  The history is provided by the patient. No language interpreter was used.    Past Medical History  Diagnosis Date  . 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 Communicates by writing   . Hypertension   . Hypercholesteremia   . Complication of anesthesia     lung   . Angina   . GERD (gastroesophageal reflux disease)   . Myocardial infarction     CABG 2001, ? stent. Cath 02/2011 with new distal LIMA-LAD 80%. SVG-OM occlusion is old, rest of grafts patent  . Peripheral vascular disease   . Diabetes mellitus   . H/O hiatal hernia    Past Surgical History  Procedure Laterality Date  . Coronary artery bypass graft    . Abdominal hysterectomy    . Breast biopsy    . Esophagogastroduodenoscopy  03/12/2011    Procedure: ESOPHAGOGASTRODUODENOSCOPY (EGD);  Surgeon: [name], MD;  Location: [facility] ENDOSCOPY;  Service: Endoscopy;  Laterality: N/A;  . Cardiac catheterization  02/2011   Family History  Problem Relation Age of Onset  . Anesthesia problems Neg Hx   . Hypotension Neg Hx   . Malignant hyperthermia Neg Hx   . Pseudochol deficiency Neg Hx   . Heart disease Mother   . Stroke Father   . Diabetes Sister   . Diabetes  Brother    History  Substance Use Topics  . Smoking status: Never Smoker   . Smokeless tobacco: Not on file  . Alcohol Use: No   OB History   Grav Para Term Preterm Abortions TAB SAB Ect Mult Living                 Review of Systems  Constitutional: Negative for fever and chills.  Respiratory: Positive for cough and shortness of breath.   Cardiovascular: Positive for chest pain.  Gastrointestinal: Negative for nausea, vomiting, diarrhea and constipation.  Genitourinary: Negative for dysuria.      Allergies  Penicillins  Home Medications   Prior to Admission medications   Medication Sig Start Date End Date Taking? Authorizing Provider  Cholecalciferol (VITAMIN D PO) Take 1 tablet by mouth daily.   Yes Historical Provider, MD  clopidogrel (PLAVIX) 75 MG tablet Take 75 mg by mouth daily.     Yes Historical Provider, MD  escitalopram (LEXAPRO) 20 MG tablet Take 20 mg by mouth daily.   Yes Historical Provider, MD  furosemide (LASIX) 20 MG tablet Take 20 mg by mouth daily.   Yes Historical Provider, MD  hydrochlorothiazide (HYDRODIURIL) 25 MG tablet Take 25 mg by mouth daily.     Yes Historical Provider, MD  insulin aspart (NOVOLOG) 100 UNIT/ML injection Inject 11 Units into the skin 3 (three) times daily with meals.   Yes Historical Provider,  MD  insulin glargine (LANTUS) 100 UNIT/ML injection Inject 20 Units into the skin at bedtime.   Yes Historical Provider, MD  nitroGLYCERIN (NITROSTAT) 0.4 MG SL tablet Place 0.4 mg under the tongue every 5 (five) minutes as needed for chest pain.   Yes Historical Provider, MD  oxyCODONE (OXY IR/ROXICODONE) 5 MG immediate release tablet Take 5 mg by mouth every 4 (four) hours as needed for severe pain.   Yes Historical Provider, MD  rosuvastatin (CRESTOR) 40 MG tablet Take 40 mg by mouth daily.   Yes Historical Provider, MD  silver sulfADIAZINE (SILVADENE) 1 % cream Apply 1 application topically 2 (two) times daily. Apply with clean bandages  two times daily to right foot 01/29/13  Yes Robynn Pane, MD   BP 125/74  Pulse 92  Temp(Src) 98.7 F (37.1 C) (Oral)  Resp 28  SpO2 96% Physical Exam  Nursing note and vitals reviewed. Constitutional: She is oriented to person, place, and time. She appears well-developed and well-nourished.  HENT:  Head: Normocephalic and atraumatic.  Eyes: Conjunctivae and EOM are normal. Pupils are equal, round, and reactive to light.  Neck: Normal range of motion. Neck supple.  Cardiovascular: Normal rate and regular rhythm.  Exam reveals no gallop and no friction rub.   No murmur heard. Pulmonary/Chest: Effort normal. No respiratory distress. She has no wheezes. She has rales. She exhibits no tenderness.  Abdominal: Soft. She exhibits no distension and no mass. There is no tenderness. There is no rebound and no guarding.  Musculoskeletal: Normal range of motion. She exhibits no edema and no tenderness.  Neurological: She is alert and oriented to person, place, and time.  Skin: Skin is warm and dry.  Wound with new granulation tissue, no evidence of cellulitis  Psychiatric: She has a normal mood and affect. Her behavior is normal. Judgment and thought content normal.    ED Course  Procedures (including critical care time) Results for orders placed during the hospital encounter of 07/13/13  CBC WITH DIFFERENTIAL      Result Value Ref Range   WBC 5.8  4.0 - 10.5 K/uL   RBC 4.30  3.87 - 5.11 MIL/uL   Hemoglobin 11.1 (*) 12.0 - 15.0 g/dL   HCT 36.0  36.0 - 46.0 %   MCV 83.7  78.0 - 100.0 fL   MCH 25.8 (*) 26.0 - 34.0 pg   MCHC 30.8  30.0 - 36.0 g/dL   RDW 14.7  11.5 - 15.5 %   Platelets 198  150 - 400 K/uL   Neutrophils Relative % 62  43 - 77 %   Neutro Abs 3.5  1.7 - 7.7 K/uL   Lymphocytes Relative 25  12 - 46 %   Lymphs Abs 1.5  0.7 - 4.0 K/uL   Monocytes Relative 11  3 - 12 %   Monocytes Absolute 0.6  0.1 - 1.0 K/uL   Eosinophils Relative 2  0 - 5 %   Eosinophils Absolute 0.1  0.0 - 0.7  K/uL   Basophils Relative 0  0 - 1 %   Basophils Absolute 0.0  0.0 - 0.1 K/uL  BASIC METABOLIC PANEL      Result Value Ref Range   Sodium 145  137 - 147 mEq/L   Potassium 4.1  3.7 - 5.3 mEq/L   Chloride 107  96 - 112 mEq/L   CO2 25  19 - 32 mEq/L   Glucose, Bld 218 (*) 70 - 99 mg/dL   BUN 15  6 - 23 mg/dL   Creatinine, Ser 0.90  0.50 - 1.10 mg/dL   Calcium 9.0  8.4 - 10.5 mg/dL   GFR calc non Af Amer 62 (*) >90 mL/min   GFR calc Af Amer 72 (*) >90 mL/min  PRO B NATRIURETIC PEPTIDE      Result Value Ref Range   Pro B Natriuretic peptide (BNP) 4949.0 (*) 0 - 125 pg/mL  I-STAT TROPOININ, ED      Result Value Ref Range   Troponin i, poc 3.38 (*) 0.00 - 0.08 ng/mL   Comment NOTIFIED PHYSICIAN     Comment 3           CBG MONITORING, ED      Result Value Ref Range   Glucose-Capillary 182 (*) 70 - 99 mg/dL   Dg Chest 2 View  07/13/2013   CLINICAL DATA:  Weakness, chest pain  EXAM: CHEST  2 VIEW  COMPARISON:  DG CHEST 2 VIEW dated 07/12/2013  FINDINGS: Sternotomy wires overlie normal cardiac silhouette. Mild improvement in central venous pulmonary congestion pattern. Smaller pleural fluid on the right. No pneumothorax. No focal consolidation.  IMPRESSION: Improvement in central venous pulmonary congestion.  Small right effusion.   Electronically Signed   By: Suzy Bouchard M.D.   On: 07/13/2013 12:03   Dg Chest 2 View  07/12/2013   CLINICAL DATA:  Shortness of breath, cough, chest pain  EXAM: CHEST  2 VIEW  COMPARISON:  Chest x-ray of 06/10/2013  FINDINGS: There is moderate cardiomegaly present with interstitial edema and small effusions consistent with mild congestive heart failure. Median sternotomy sutures are noted from prior CABG. There is soft tissue opacity within the retrosternal airspace just anterior to the ascending aorta of questionable significance. If further assessment is warranted CT angiogram of the chest is recommended. And old lower thoracic compression deformity is noted.   IMPRESSION: 1. Cardiomegaly and interstitial edema with small effusions. 2. Soft tissue opacity in the retrosternal airspace of uncertain significance which is new. Consider CTA of the chest.   Electronically Signed   By: Ivar Drape M.D.   On: 07/12/2013 13:44     Imaging Review Dg Chest 2 View  07/12/2013   CLINICAL DATA:  Shortness of breath, cough, chest pain  EXAM: CHEST  2 VIEW  COMPARISON:  Chest x-ray of 06/10/2013  FINDINGS: There is moderate cardiomegaly present with interstitial edema and small effusions consistent with mild congestive heart failure. Median sternotomy sutures are noted from prior CABG. There is soft tissue opacity within the retrosternal airspace just anterior to the ascending aorta of questionable significance. If further assessment is warranted CT angiogram of the chest is recommended. And old lower thoracic compression deformity is noted.  IMPRESSION: 1. Cardiomegaly and interstitial edema with small effusions. 2. Soft tissue opacity in the retrosternal airspace of uncertain significance which is new. Consider CTA of the chest.   Electronically Signed   By: Ivar Drape M.D.   On: 07/12/2013 13:44     EKG Interpretation   Date/Time:  Wednesday Jul 13 2013 11:45:33 EDT Ventricular Rate:  77 PR Interval:  179 QRS Duration: 86 QT Interval:  444 QTC Calculation: 502 R Axis:   63 Text Interpretation:  Sinus rhythm Probable left atrial enlargement  Probable LVH with secondary repol abnrm Prolonged QT interval Sinus rhythm  Artifact Left ventricular hypertrophy Abnormal ekg Confirmed by Carmin Muskrat  MD ([id number]) on 07/13/2013 12:01:34 PM      MDM   Final  diagnoses:  NSTEMI (non-ST elevated myocardial infarction)  Chest pain    Patient with chest pain and elevated troponin.  Extensive history of heart disease.    Will consult Dr. Tamsen Snider.  Discussed the patient with Dr. Tamsen Snider, who will admit the patient.  Medications  heparin ADULT infusion 100 units/mL  (25000 units/250 mL) (not administered)  heparin bolus via infusion 4,000 Units (not administered)   CRITICAL CARE Performed by: Montine Circle   Total critical care time: 35  Critical care time was exclusive of separately billable procedures and treating other patients.  Critical care was necessary to treat or prevent imminent or life-threatening deterioration.  Critical care was time spent personally by me on the following activities: development of treatment plan with patient and/or surrogate as well as nursing, discussions with consultants, evaluation of patient's response to treatment, examination of patient, obtaining history from patient or surrogate, ordering and performing treatments and interventions, ordering and review of laboratory studies, ordering and review of radiographic studies, pulse oximetry and re-evaluation of patient's condition.   Heparin infusion, positive troponin, elevated BNP with small pleural effusion.  Filed Vitals:   07/13/13 1245  BP: 138/76  Pulse: 104  Temp:   Resp: [address] PA-C 07/13/13 1256

## 2013-07-13 NOTE — Progress Notes (Signed)
ANTICOAGULATION CONSULT NOTE - Initial Consult  Pharmacy Consult for Heparin Indication: chest pain/ACS  Allergies  Allergen Reactions  . Penicillins Swelling    Swelling of mouth    Patient Measurements: Height: 5\' 5"  (165.1 cm) Weight: 180 lb (81.647 kg) IBW/kg (Calculated) : 57 Heparin Dosing Weight: 74 kg  Vital Signs: Temp: 98.7 F (37.1 C) (05/20 1027) Temp src: Oral (05/20 1027) BP: 140/69 mmHg (05/20 1145) Pulse Rate: 76 (05/20 1145)  Labs:  Recent Labs  07/13/13 1033  HGB 11.1*  HCT 36.0  PLT 198  CREATININE 0.90    Estimated Creatinine Clearance: 59.6 ml/min (by C-G formula based on Cr of 0.9).   Medical History: Past Medical History  Diagnosis Date  . 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 Communicates by writing   . Hypertension   . Hypercholesteremia   . Complication of anesthesia     lung   . Angina   . GERD (gastroesophageal reflux disease)   . Myocardial infarction     CABG 2001, ? stent. Cath 02/2011 with new distal LIMA-LAD 80%. SVG-OM occlusion is old, rest of grafts patent  . Peripheral vascular disease   . Diabetes mellitus   . H/O hiatal hernia     Assessment: 37 YOF who presented to the [facility] on 5/20 with CP and elevated troponins (first set 3.38). Pharmacy consulted to start heparin for anticoagulation while awaiting further cardiology work-up.  The patient is noted to have prior CVAs in '02 and '03 with residual weakness and aphasia. Baseline CBC wnl except Hgb slightly low at 11.1. The patient was on plavix PTA was secondary stoke prophylaxis - no other blood thinners noted.   Goal of Therapy:  Heparin level 0.3-0.7 units/ml Monitor platelets by anticoagulation protocol: Yes   Plan:  1. Heparin bolus of 4000 unit x 1 2. Initiate heparin drip at a rate of 850 units/hr 3. Will continue to monitor for any signs/symptoms of bleeding and will follow up with heparin level in 8 hours   Alycia Rossetti,  PharmD, BCPS Clinical Pharmacist Pager: [phone] 07/13/2013 12:04 PM

## 2013-07-14 ENCOUNTER — Encounter (HOSPITAL_COMMUNITY)
Admission: EM | Disposition: A | Discharge status: Skilled Nursing Facility | Payer: Self-pay | Source: Home / Self Care | Attending: Cardiology

## 2013-07-14 LAB — LIPID PANEL
CHOL/HDL RATIO: 3.5 ratio
CHOLESTEROL: 171 mg/dL (ref 0–200)
HDL: 49 mg/dL (ref >39)
LDL Cholesterol: 113 mg/dL — ABNORMAL HIGH (ref 0–99)
TRIGLYCERIDES: 45 mg/dL (ref <150)
VLDL: 9 mg/dL (ref 0–40)

## 2013-07-14 LAB — BASIC METABOLIC PANEL
BUN: 16 mg/dL (ref 6–23)
CO2: 25 mEq/L (ref 19–32)
Calcium: 8.7 mg/dL (ref 8.4–10.5)
Chloride: 107 mEq/L (ref 96–112)
Creatinine, Ser: 0.95 mg/dL (ref 0.50–1.10)
GFR, EST AFRICAN AMERICAN: 68 mL/min — AB (ref >90)
GFR, EST NON AFRICAN AMERICAN: 58 mL/min — AB (ref >90)
Glucose, Bld: 180 mg/dL — ABNORMAL HIGH (ref 70–99)
POTASSIUM: 3.8 meq/L (ref 3.7–5.3)
Sodium: 144 mEq/L (ref 137–147)

## 2013-07-14 LAB — GLUCOSE, CAPILLARY
GLUCOSE-CAPILLARY: 157 mg/dL — AB (ref 70–99)
Glucose-Capillary: 151 mg/dL — ABNORMAL HIGH (ref 70–99)
Glucose-Capillary: 81 mg/dL (ref 70–99)
Glucose-Capillary: 202 mg/dL — ABNORMAL HIGH (ref 70–99)

## 2013-07-14 LAB — CBC
HCT: 35.2 % — ABNORMAL LOW (ref 36.0–46.0)
HEMOGLOBIN: 10.7 g/dL — AB (ref 12.0–15.0)
MCH: 25.7 pg — ABNORMAL LOW (ref 26.0–34.0)
MCHC: 30.4 g/dL (ref 30.0–36.0)
MCV: 84.4 fL (ref 78.0–100.0)
PLATELETS: 188 10*3/uL (ref 150–400)
RBC: 4.17 MIL/uL (ref 3.87–5.11)
RDW: 14.7 % (ref 11.5–15.5)
WBC: 5.6 10*3/uL (ref 4.0–10.5)

## 2013-07-14 LAB — HEPARIN LEVEL (UNFRACTIONATED)
HEPARIN UNFRACTIONATED: 0.19 [IU]/mL — AB (ref 0.30–0.70)
Heparin Unfractionated: 0.61 IU/mL (ref 0.30–0.70)

## 2013-07-14 LAB — PRO B NATRIURETIC PEPTIDE: PRO B NATRI PEPTIDE: 5556 pg/mL — AB (ref 0–125)

## 2013-07-14 LAB — TROPONIN I
Troponin I: 3.03 ng/mL (ref <0.30)
Troponin I: 2.79 ng/mL (ref <0.30)

## 2013-07-14 SURGERY — LEFT HEART CATHETERIZATION WITH CORONARY ANGIOGRAM
Anesthesia: LOCAL

## 2013-07-14 MED ORDER — TORSEMIDE 20 MG PO TABS
20.0000 mg | ORAL_TABLET | Freq: Two times a day (BID) | ORAL | Status: DC
  Administered 2013-07-14 – 2013-07-16 (×3): 20 mg via ORAL
  Filled 2013-07-14 (×6): qty 1

## 2013-07-14 MED ORDER — HEPARIN BOLUS VIA INFUSION
1500.0000 [IU] | Freq: Once | INTRAVENOUS | Status: AC
  Administered 2013-07-14: 1500 [IU] via INTRAVENOUS
  Filled 2013-07-14: qty 1500

## 2013-07-14 NOTE — Progress Notes (Signed)
[facility] for Heparin Indication: chest pain/ACS  Allergies  Allergen Reactions  . Penicillins Swelling    Swelling of mouth    Patient Measurements: Height: 5\' 5"  (165.1 cm) Weight: 192 lb 10.9 oz (87.4 kg) IBW/kg (Calculated) : 57 Heparin Dosing Weight: 74 kg  Vital Signs: Temp: 97.7 F (36.5 C) (05/21 1149) Temp src: Oral (05/21 1149) BP: 137/73 mmHg (05/21 1100) Pulse Rate: 72 (05/21 1149)  Labs:  Recent Labs  07/13/13 1033 07/13/13 1430 07/13/13 1625 07/13/13 2100 07/14/13 0327 07/14/13 1135  HGB 11.1*  --   --   --  10.7*  --   HCT 36.0  --   --   --  35.2*  --   PLT 198  --   --   --  188  --   APTT  --  101*  --   --   --   --   LABPROT  --  15.6*  --   --   --   --   INR  --  1.27  --   --   --   --   HEPARINUNFRC  --   --   --  0.12* 0.19* 0.61  CREATININE 0.90  --   --   --  0.95  --   TROPONINI  --   --  3.60* 3.72* 3.03*  --     Estimated Creatinine Clearance: 58.5 ml/min (by C-G formula based on Cr of 0.95).  Assessment: 73 yo female with NSTEMI on heparin at 1200 units/hr and heparin level is at goal (HL= 0.61).   Goal of Therapy:  Heparin level 0.3-0.7 units/ml Monitor platelets by anticoagulation protocol: Yes   Plan:  -No heparin changes need -Will check a heparin level later today to confirm  Hildred Laser, Pharm D 07/14/2013 1:30 PM

## 2013-07-14 NOTE — Progress Notes (Signed)
  Echocardiogram 2D Echocardiogram has been performed.  Donata Clay 07/14/2013, 9:28 AM

## 2013-07-14 NOTE — Progress Notes (Signed)
Subjective:  Feels much better..  She has not had any further chest pains.  Breathing is better.  No fever, no nausea or vomiting.  Objective:  Vital Signs in the last 24 hours: Temp:  [98.2 F (36.8 C)-99.2 F (37.3 C)] 99.2 F (37.3 C) (05/21 0700) Pulse Rate:  [51-109] 69 (05/21 0700) Resp:  [0-34] 24 (05/21 0600) BP: (69-152)/(38-90) 135/63 mmHg (05/21 0700) SpO2:  [97 %-100 %] 100 % (05/21 0700) Weight:  [81.647 kg (180 lb)-91.4 kg (201 lb 8 oz)] 87.4 kg (192 lb 10.9 oz) (05/21 0341)  Intake/Output from previous day: 05/20 0701 - 05/21 0700 In: 247 [P.O.:120; I.V.:127] Out: 1550 [Urine:1550]  Physical Exam: General appearance: alert, cooperative, appears stated age no acute distress Eyes: negative findings: lids and lashes normal Neck: Right facial droop evident.  JVP - elevated.  carotids 2+= without bruits Resp: Right base decreased breath sounds, coarse crackles are heard right worse than the left.. Chest wall: no tenderness Cardio: S1, S2 normal, S3 gallop present, no click, no rub and No murmur GI: soft, non-tender; bowel sounds normal; no masses,  no organomegaly Extremities: edema 2 + bilateral below-knee pitting edema which is much improved and the skin is now shriveled due to loss of edema.  there is skin breakdown of both feet. Pulses: Bilateral carotid arteries normal, femoral pulses normal, popliteal pulse and pedal pulse absent. Skin: Skin breakdown of both feet evident. Has decub ulcer in her right ankle and dorsal foot on the left.  Lab Results: BMP  Recent Labs  07/13/13 1033 07/14/13 0327  NA 145 144  K 4.1 3.8  CL 107 107  CO2 25 25  GLUCOSE 218* 180*  BUN 15 16  CREATININE 0.90 0.95  CALCIUM 9.0 8.7  GFRNONAA 62* 58*  GFRAA 72* 68*    CBC  Recent Labs Lab 07/13/13 1033 07/14/13 0327  WBC 5.8 5.6  RBC 4.30 4.17  HGB 11.1* 10.7*  HCT 36.0 35.2*  PLT 198 188  MCV 83.7 84.4  MCH 25.8* 25.7*  MCHC 30.8 30.4  RDW 14.7 14.7  LYMPHSABS  1.5  --   MONOABS 0.6  --   EOSABS 0.1  --   BASOSABS 0.0  --     HEMOGLOBIN A1C Lab Results  Component Value Date   HGBA1C 7.0* 07/13/2013   MPG 154* 07/13/2013    Cardiac Panel (last 3 results)  Recent Labs  07/13/13 1625 07/13/13 2100 07/14/13 0327  TROPONINI 3.60* 3.72* 3.03*    BNP (last 3 results)  Recent Labs  07/13/13 1033 07/14/13 0327  PROBNP 4949.0* 5556.0*    TSH No results found for this basename: TSH,  in the last 8760 hours  CHOLESTEROL  Recent Labs  07/14/13 0327  CHOL 171    Hepatic Function Panel No results found for this basename: PROT, ALBUMIN, AST, ALT, ALKPHOS, BILITOT, BILIDIR, IBILI,  in the last 8760 hours  Imaging: Imaging results have been reviewed  Cardiac Studies:  EKG: Normal sinus rhythm, normal axis. Inferior and lateral ST segment depression with nonspecific T. abnormality, cannot exclude inferior and lateral ischemia. Compared to prior EKG from 2014, ST segment changes are minimally worse.    Echocardiogram 07/14/2013: Moderate biventricular dilatation, severe global hypokinesis, markedly decreased left frontal systolic function, ejection fraction 25%, global hypokinesis, findings are consistent with dilated cardiomyopathy.  Some of the features are consistent with restrictive filling pattern.  Right ventricle is also dilated and hypokinetic.  RV systolic function also moderately depressed.  Echocardiogram  06/21/2012: Normal left systolic function, ejection fraction 55-60%.   Assessment/Plan  1. Non-ST elevation myocardial infarction, I suspect cardiomyopathy to be the etiology for ST elevation myocardial infarction, do not think there may have been significant progression of coronary artery disease to give global hypokinesis and also right ventricular dysfunction.  2. Coronary artery disease, history of CABG. Cardiac cath 2013: new lesion distal to LIMA insertion of the LAD which is about 80%. However this can be treated  medically. SVG to RCA, LIMA to LAD, are patent. SVG to OM is occluded and this is old. 3.  Acute systolic and diastolic heart failure, abnormal echocardiogram findings are new compared to 06/21/2012. 4.  Dilated cardiomyopathy, etiology remains unknown at this time.  May need coronary angiography to exclude progression of coronary artery disease, but appears to be less likely given her presentation and biventricular failure. 5. 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 Abnormal CT scan of the chest revealing retrosternal shadowing, nonspecific.  5. Diabetes mellitus type 2 controlled   Recommendation: I have discontinued furosemide IV, she has responded well with significant diuresis.  She had hypotension last night requiring inotropic support.  In spite of the inotropic support her ejection fraction is markedly depressed suggesting that her EF is probably much less than anticipated 25%.  I will start her on Demadex given biventricular failure she may respond better to this medication for oral medication.  Her blood pressure has been fairly stable with occasional episodes of low blood pressure, I will continue dopamine for today and discontinued by this afternoon or tomorrow.  I will continue to trend her troponins, if there is no plateau and dip in troponin, the troponin leak could be secondary to cardiomyopathy.   [name], M.D. 07/14/2013, 10:39 AM [facility] Pager: [phone] Office: [phone] If no answer: [phone]

## 2013-07-14 NOTE — Progress Notes (Signed)
[facility] for Heparin Indication: chest pain/ACS  Allergies  Allergen Reactions  . Penicillins Swelling    Swelling of mouth    Patient Measurements: Height: 5\' 5"  (165.1 cm) Weight: 192 lb 10.9 oz (87.4 kg) IBW/kg (Calculated) : 57 Heparin Dosing Weight: 74 kg  Vital Signs: Temp: 98.2 F (36.8 C) (05/21 0341) Temp src: Oral (05/21 0341) BP: 128/80 mmHg (05/21 0502) Pulse Rate: 73 (05/21 0502)  Labs:  Recent Labs  07/13/13 1033 07/13/13 1430 07/13/13 1625 07/13/13 2100 07/14/13 0327  HGB 11.1*  --   --   --  10.7*  HCT 36.0  --   --   --  35.2*  PLT 198  --   --   --  188  APTT  --  101*  --   --   --   LABPROT  --  15.6*  --   --   --   INR  --  1.27  --   --   --   HEPARINUNFRC  --   --   --  0.12* 0.19*  CREATININE 0.90  --   --   --   --   TROPONINI  --   --  3.60* 3.72* 3.03*    Estimated Creatinine Clearance: 61.7 ml/min (by C-G formula based on Cr of 0.9).  Assessment: 73 yo female with NSTEMI for heparin  Goal of Therapy:  Heparin level 0.3-0.7 units/ml Monitor platelets by anticoagulation protocol: Yes   Plan:  Heparin 1500 units IV bolus, then increase heparin 1200 units/hr Check heparin level in 6 hours.  Phillis Knack, PharmD, BCPS   I5/21/2015 5:33 AM

## 2013-07-14 NOTE — Care Management Note (Signed)
    Page 1 of 1   07/14/2013     7:37:32 AM CARE MANAGEMENT NOTE 07/14/2013  Patient:  [name] [name]   Account Number:  [id number]  Date Initiated:  07/14/2013  Documentation initiated by:  Elissa Hefty  Subjective/Objective Assessment:   adm w nstemi     Action/Plan:   lives w da, pcp dr [name] [name]   Anticipated DC Date:     Anticipated DC Plan:           Choice offered to / List presented to:             Status of service:   Medicare Important Message given?   (If response is "NO", the following Medicare IM given date fields will be blank) Date Medicare IM given:   Date Additional Medicare IM given:    Discharge Disposition:    Per UR Regulation:  Reviewed for med. necessity/level of care/duration of stay  If discussed at [place] of Stay Meetings, dates discussed:    Comments:

## 2013-07-15 LAB — CBC
HEMATOCRIT: 35.1 % — AB (ref 36.0–46.0)
HEMOGLOBIN: 10.6 g/dL — AB (ref 12.0–15.0)
MCH: 25.5 pg — ABNORMAL LOW (ref 26.0–34.0)
MCHC: 30.2 g/dL (ref 30.0–36.0)
MCV: 84.6 fL (ref 78.0–100.0)
Platelets: 175 10*3/uL (ref 150–400)
RBC: 4.15 MIL/uL (ref 3.87–5.11)
RDW: 14.6 % (ref 11.5–15.5)
WBC: 5.9 10*3/uL (ref 4.0–10.5)

## 2013-07-15 LAB — GLUCOSE, CAPILLARY
GLUCOSE-CAPILLARY: 198 mg/dL — AB (ref 70–99)
GLUCOSE-CAPILLARY: 178 mg/dL — AB (ref 70–99)
Glucose-Capillary: 96 mg/dL (ref 70–99)
Glucose-Capillary: 159 mg/dL — ABNORMAL HIGH (ref 70–99)

## 2013-07-15 LAB — BASIC METABOLIC PANEL
BUN: 18 mg/dL (ref 6–23)
CO2: 27 mEq/L (ref 19–32)
Calcium: 8.9 mg/dL (ref 8.4–10.5)
Chloride: 103 mEq/L (ref 96–112)
Creatinine, Ser: 1.07 mg/dL (ref 0.50–1.10)
GFR calc Af Amer: 59 mL/min — ABNORMAL LOW (ref >90)
GFR calc non Af Amer: 51 mL/min — ABNORMAL LOW (ref >90)
Glucose, Bld: 318 mg/dL — ABNORMAL HIGH (ref 70–99)
POTASSIUM: 3.7 meq/L (ref 3.7–5.3)
Sodium: 142 mEq/L (ref 137–147)

## 2013-07-15 LAB — URINE CULTURE
CULTURE: NO GROWTH
Colony Count: NO GROWTH

## 2013-07-15 LAB — PRO B NATRIURETIC PEPTIDE: Pro B Natriuretic peptide (BNP): 3819 pg/mL — ABNORMAL HIGH (ref 0–125)

## 2013-07-15 LAB — HEPARIN LEVEL (UNFRACTIONATED): HEPARIN UNFRACTIONATED: 0.42 [IU]/mL (ref 0.30–0.70)

## 2013-07-15 MED ORDER — ISOSORB DINITRATE-HYDRALAZINE 20-37.5 MG PO TABS
1.0000 | ORAL_TABLET | Freq: Three times a day (TID) | ORAL | Status: DC
  Administered 2013-07-15 – 2013-07-20 (×12): 1 via ORAL
  Filled 2013-07-15 (×17): qty 1

## 2013-07-15 MED ORDER — LISINOPRIL 5 MG PO TABS
5.0000 mg | ORAL_TABLET | Freq: Every day | ORAL | Status: DC
  Administered 2013-07-15 – 2013-07-20 (×6): 5 mg via ORAL
  Filled 2013-07-15 (×6): qty 1

## 2013-07-15 MED ORDER — ALBUTEROL SULFATE (2.5 MG/3ML) 0.083% IN NEBU: 2.5000 mg | INHALATION_SOLUTION | Freq: Three times a day (TID) | RESPIRATORY_TRACT | Status: DC | PRN

## 2013-07-15 MED ORDER — ALBUTEROL SULFATE (2.5 MG/3ML) 0.083% IN NEBU
2.5000 mg | INHALATION_SOLUTION | Freq: Two times a day (BID) | RESPIRATORY_TRACT | Status: DC
  Administered 2013-07-16 – 2013-07-20 (×9): 2.5 mg via RESPIRATORY_TRACT
  Filled 2013-07-15 (×9): qty 3

## 2013-07-15 NOTE — Evaluation (Signed)
Clinical/Bedside Swallow Evaluation Patient Details  Name: [name] [name] MRN: [id number] Date of Birth: 07/01/1940  Today's Date: 07/15/2013 Time: 1345-1430 SLP Time Calculation (min): 45 min  Past Medical History:  Past Medical History  Diagnosis Date  . 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 Communicates by writing   . Hypertension   . Hypercholesteremia   . Complication of anesthesia     lung   . Angina   . GERD (gastroesophageal reflux disease)   . Myocardial infarction     CABG 2001, ? stent. Cath 02/2011 with new distal LIMA-LAD 80%. SVG-OM occlusion is old, rest of grafts patent  . Peripheral vascular disease   . Diabetes mellitus   . H/O hiatal hernia    Past Surgical History:  Past Surgical History  Procedure Laterality Date  . Coronary artery bypass graft    . Abdominal hysterectomy    . Breast biopsy    . Esophagogastroduodenoscopy  03/12/2011    Procedure: ESOPHAGOGASTRODUODENOSCOPY (EGD);  Surgeon: [name], MD;  Location: [facility] ENDOSCOPY;  Service: Endoscopy;  Laterality: N/A;  . Cardiac catheterization  1/20146   HPI:  73 year old female admitted 07/13/13 due to chest pain x1 month.  PMH significant for CVA (2595,6387) with aphasia, dense right hemi, DM, HTN, CAD s/p CABG, NSTEMI, PNA, GERD, EGD (2013). CXR 07/14/13 revealed no focal consolidation. BSE ordered to evaluate swallow function and safety   Assessment / Plan / Recommendation Clinical Impression  Pt completed oral care with suction after setup.  Pt noted to have significant anterior leakage of oral secretions.  Oral motor strength and function are impaired, but functional for pt, and are her baseline levels of function. Overt s/s aspiration observed x1 on thin liquid. Otherwise, no obvious difficulty with any consistency tested.  Of note, pt has a long standing history of dysphagia, with most recent objective study completed in June 2013.  Pt makes her GOC clear, in that she does  not want a feeding tube placed, she wants to continue eating, and does not want to repeat the MBS.  Discussed most appropriate diet level with pt and daughter. Will change diet to dys 3 with chopped meats, thin liquids. Pt avoids particulate items and straws due to poor oral strength and coordination.  Both pt and daughter report pt is currently at her baseline level of swallow function.  ST will follow up x1 for diet tolerance.      Aspiration Risk  Severe    Diet Recommendation Dysphagia 3 (Mechanical Soft);Thin liquid   Liquid Administration via: Cup Medication Administration: Whole meds with liquid Supervision: Staff to assist with self feeding;Intermittent supervision to cue for compensatory strategies Compensations: Slow rate;Small sips/bites Postural Changes and/or Swallow Maneuvers: Seated upright 90 degrees;Upright 30-60 min after meal    Other  Recommendations Oral Care Recommendations: Oral care Q4 per protocol Other Recommendations: Have oral suction available   Follow Up Recommendations  24 hour supervision/assistance    Frequency and Duration min 1 x/week  1 week   Pertinent Vitals/Pain VSS, no pain reported    SLP Swallow Goals   diet tol   Swallow Study Prior Functional Status   Long standing history of dysphagia, with most recent MBS June 2014. Pt and dtr report current level of swallow function is pt's baseline.  Will change diet to dys 3 with chopped meats, thin liquids and follow briefly for diet tol.    General Date of Onset: 07/13/13 HPI: 73  year old female admitted 07/13/13 due to chest pain x1 month.  PMH significant for CVA (9741,6384) with aphasia, dense right hemi, DM, HTN, CAD s/p CABG, NSTEMI, PNA, GERD, EGD (2013). CXR 07/14/13 revealed no focal consolidation. BSE ordered to evaluate swallow function and safety Type of Study: Bedside swallow evaluation Previous Swallow Assessment: MBS 06/2010, 07/2011 Diet Prior to this Study: Regular Temperature Spikes  Noted: No Respiratory Status: Nasal cannula History of Recent Intubation: No Behavior/Cognition: Alert;Cooperative;Pleasant mood Oral Cavity - Dentition: Missing dentition Self-Feeding Abilities: Able to feed self;Needs set up Patient Positioning: Upright in bed Baseline Vocal Quality: Clear Volitional Cough: Weak Volitional Swallow: Unable to elicit    Oral/Motor/Sensory Function Overall Oral Motor/Sensory Function: Impaired at baseline   Ice Chips Ice chips: Not tested   Thin Liquid Thin Liquid: Within functional limits Presentation: Cup;Self Fed Other Comments: at baseline per pt/dtr    Nectar Thick Nectar Thick Liquid: Not tested   Honey Thick Honey Thick Liquid: Not tested   Puree Puree: Within functional limits Other Comments: at baseline per pt/dtr   Solid   GO   [name] [name] [facility], CCC-SLP [phone]  Solid: Within functional limits Presentation: Self Fed Other Comments: at baseline per pt/dtr       [name] 07/15/2013,2:46 PM

## 2013-07-15 NOTE — Progress Notes (Signed)
3:16 AM  Confirmatory HL is 0.42 IU with goal 0.3-0.7. No bleeding noted. Will continue at current rate and fu daily.  Curlene Dolphin

## 2013-07-15 NOTE — Progress Notes (Addendum)
Attempted multiple times to encourage and educate pt on the importance of taking morning meds, offered to crush meds and put in apple sauce or pudding  - pt continues to refuse.  Documented in [facility] as refusal.  Able to appropriately communicate via pencil/paper.   Pt currently watching tv and drinking sprite - no c/o pain or discomfort.  Will continue to closely monitor.

## 2013-07-15 NOTE — Progress Notes (Signed)
Report given to Raquel Sarna, RN for transfer per MD order to 3W.  Pt advised of transfer - no questions or concerns at this time.   VSS and no acute issues prior to transfer via bed.

## 2013-07-15 NOTE — Progress Notes (Signed)
Inpatient Diabetes Program Recommendations  AACE/ADA: New Consensus Statement on Inpatient Glycemic Control (2013)  Target Ranges:  Prepandial:   less than 140 mg/dL      Peak postprandial:   less than 180 mg/dL (1-2 hours)      Critically ill patients:  140 - 180 mg/dL   Inpatient Diabetes Program Recommendations Correction (SSI): add HS scale  Thank you  Rhia Blatchford BSN, RN,CDE Inpatient Diabetes Coordinator [phone] (team pager)     

## 2013-07-15 NOTE — Progress Notes (Signed)
Subjective:  Feels much better..  She has not had any further chest pains.  Breathing is better.  No fever, no nausea or vomiting.  Objective:  Vital Signs in the last 24 hours: Temp:  [97.3 F (36.3 C)-98.5 F (36.9 C)] 98 F (36.7 C) (05/22 1332) Pulse Rate:  [58-79] 62 (05/22 1332) Resp:  [0-29] 29 (05/22 1300) BP: (92-128)/(44-92) 96/82 mmHg (05/22 1332) SpO2:  [95 %-100 %] 96 % (05/22 1332) Weight:  [89 kg (196 lb 3.4 oz)] 89 kg (196 lb 3.4 oz) (05/22 0500)  Intake/Output from previous day: 05/21 0701 - 05/22 0700 In: 852.9 [P.O.:700; I.V.:152.9] Out: 2910 [Urine:2910]  Physical Exam: General appearance: alert, cooperative, appears stated age no acute distress Eyes: negative findings: lids and lashes normal Neck: Right facial droop evident.  JVP - elevated.  carotids 2+= without bruits Resp: Clear to auscultate.  Chest wall: no tenderness Cardio: S1, S2 normal, S3 gallop present, no click, no rub and No murmur GI: soft, non-tender; bowel sounds normal; no masses,  no organomegaly Extremities: edema 2 + bilateral below-knee pitting edema which is much improved and the skin is now shriveled due to loss of edema.  there is skin breakdown of both feet. Pulses: Bilateral carotid arteries normal, femoral pulses normal, popliteal pulse and pedal pulse absent. Skin: Skin breakdown of both feet evident. Has decub ulcer in her right ankle and dorsal foot on the left.  Lab Results: BMP  Recent Labs  07/13/13 1033 07/14/13 0327 07/15/13 0121  NA 145 144 142  K 4.1 3.8 3.7  CL 107 107 103  CO2 25 25 27   GLUCOSE 218* 180* 318*  BUN 15 16 18   CREATININE 0.90 0.95 1.07  CALCIUM 9.0 8.7 8.9  GFRNONAA 62* 58* 51*  GFRAA 72* 68* 59*    CBC  Recent Labs Lab 07/13/13 1033  07/15/13 0121  WBC 5.8  < > 5.9  RBC 4.30  < > 4.15  HGB 11.1*  < > 10.6*  HCT 36.0  < > 35.1*  PLT 198  < > 175  MCV 83.7  < > 84.6  MCH 25.8*  < > 25.5*  MCHC 30.8  < > 30.2  RDW 14.7  < > 14.6   LYMPHSABS 1.5  --   --   MONOABS 0.6  --   --   EOSABS 0.1  --   --   BASOSABS 0.0  --   --   < > = values in this interval not displayed.  HEMOGLOBIN A1C Lab Results  Component Value Date   HGBA1C 7.0* 07/13/2013   MPG 154* 07/13/2013    Cardiac Panel (last 3 results)  Recent Labs  07/13/13 2100 07/14/13 0327 07/14/13 1336  TROPONINI 3.72* 3.03* 2.79*    BNP (last 3 results)  Recent Labs  07/13/13 1033 07/14/13 0327 07/15/13 0121  PROBNP 4949.0* 5556.0* 3819.0*    TSH No results found for this basename: TSH,  in the last 8760 hours  CHOLESTEROL  Recent Labs  07/14/13 0327  CHOL 171    Hepatic Function Panel No results found for this basename: PROT, ALBUMIN, AST, ALT, ALKPHOS, BILITOT, BILIDIR, IBILI,  in the last 8760 hours  Imaging: Imaging results have been reviewed  Cardiac Studies: Tele 07/15/2013: NSR. EKG: Normal sinus rhythm, normal axis. Inferior and lateral ST segment depression with nonspecific T. abnormality, cannot exclude inferior and lateral ischemia. Compared to prior EKG from 2014, ST segment changes are minimally worse.    Echocardiogram  07/14/2013: Moderate biventricular dilatation, severe global hypokinesis, markedly decreased left frontal systolic function, ejection fraction 25%, global hypokinesis, findings are consistent with dilated cardiomyopathy.  Some of the features are consistent with restrictive filling pattern.  Right ventricle is also dilated and hypokinetic.  RV systolic function also moderately depressed.  Echocardiogram 06/21/2012: Normal left systolic function, ejection fraction 55-60%.   Scheduled Meds: . albuterol  2.5 mg Nebulization q12n4p  . aspirin EC  81 mg Oral Daily  . atorvastatin  80 mg Oral q1800  . cholecalciferol  400 Units Oral Daily  . clopidogrel  75 mg Oral Q breakfast  . insulin aspart  0-15 Units Subcutaneous TID WC  . insulin aspart  11 Units Subcutaneous TID WC  . insulin glargine  20 Units  Subcutaneous QHS  . isosorbide-hydrALAZINE  1 tablet Oral TID  . lisinopril  5 mg Oral Daily  . metoprolol tartrate  25 mg Oral BID  . potassium chloride  10 mEq Oral BID  . silver sulfADIAZINE   Topical BID  . torsemide  20 mg Oral BID   Continuous Infusions:  PRN Meds:.acetaminophen, albuterol, nitroGLYCERIN, ondansetron (ZOFRAN) IV, oxyCODONE  Assessment/Plan  1. Non-ST elevation myocardial infarction, Type II troponin lead I suspect cardiomyopathy to be the etiology for ST elevation myocardial infarction, do not think there may have been significant progression of coronary artery disease to give global hypokinesis and also right ventricular dysfunction.  2. Coronary artery disease, history of CABG. Cardiac cath 2013: new lesion distal to LIMA insertion of the LAD which is about 80%. However this can be treated medically. 2012 coronary angio: SVG to RCA, LIMA to LAD, are patent. SVG to OM is occluded and this is old. 3.  Acute systolic and diastolic heart failure, abnormal echocardiogram findings are new compared to 06/21/2012. 4.  Dilated cardiomyopathy, etiology remains unknown at this time.  May need coronary angiography to exclude progression of coronary artery disease, but appears to be less likely given her presentation and biventricular failure. Also patient is very frail with multiple medical comorbidities and I would lean towards conservative therapy. 5. 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 Abnormal CT scan of the chest revealing retrosternal shadowing, nonspecific.  5. Diabetes mellitus type 2 controlled   Recommendation:  Transfer to tele. Swallow eval complete. Continue gentle diuresis. S. Cr trending up. Would prefer her to go to SNF for rehab, patient wants to go home. I discussed issues with daughter.  No changes to medications for now. Discontinued IV heparin. Continue Plavix. Added Albuterol for bronchospasm and dyspnea. Full code.   [name],  M.D. 07/15/2013, 4:41 PM [facility], [place] Pager: [phone] Office: [phone] If no answer: [phone]

## 2013-07-16 LAB — GLUCOSE, CAPILLARY
GLUCOSE-CAPILLARY: 118 mg/dL — AB (ref 70–99)
GLUCOSE-CAPILLARY: 128 mg/dL — AB (ref 70–99)
Glucose-Capillary: 155 mg/dL — ABNORMAL HIGH (ref 70–99)
Glucose-Capillary: 87 mg/dL (ref 70–99)

## 2013-07-16 LAB — BASIC METABOLIC PANEL
BUN: 20 mg/dL (ref 6–23)
CHLORIDE: 103 meq/L (ref 96–112)
CO2: 32 mEq/L (ref 19–32)
Calcium: 8.6 mg/dL (ref 8.4–10.5)
Creatinine, Ser: 1.12 mg/dL — ABNORMAL HIGH (ref 0.50–1.10)
GFR calc Af Amer: 55 mL/min — ABNORMAL LOW (ref >90)
GFR, EST NON AFRICAN AMERICAN: 48 mL/min — AB (ref >90)
GLUCOSE: 163 mg/dL — AB (ref 70–99)
Potassium: 3.3 mEq/L — ABNORMAL LOW (ref 3.7–5.3)
SODIUM: 144 meq/L (ref 137–147)

## 2013-07-16 LAB — PRO B NATRIURETIC PEPTIDE: Pro B Natriuretic peptide (BNP): 3503 pg/mL — ABNORMAL HIGH (ref 0–125)

## 2013-07-16 MED ORDER — FUROSEMIDE 20 MG PO TABS
20.0000 mg | ORAL_TABLET | Freq: Every day | ORAL | Status: DC
  Administered 2013-07-17 – 2013-07-20 (×4): 20 mg via ORAL
  Filled 2013-07-16 (×4): qty 1

## 2013-07-16 MED ORDER — POTASSIUM CHLORIDE CRYS ER 20 MEQ PO TBCR
40.0000 meq | EXTENDED_RELEASE_TABLET | Freq: Once | ORAL | Status: AC
  Administered 2013-07-16: 40 meq via ORAL
  Filled 2013-07-16: qty 2

## 2013-07-16 NOTE — Progress Notes (Signed)
Patient potassium 3.3, MD Jules Husbands made aware, new order given for 1 x dose of potassium 40 mEq PO. Will continue to monitor patient.

## 2013-07-16 NOTE — Progress Notes (Signed)
Patient had 7 beat run of V-tach, asymptomatic, Dr.Kadakia  made aware, orders given for A.M basic metabolic panel and magnesium levels. Will continue to monitor patient.

## 2013-07-16 NOTE — Progress Notes (Signed)
Subjective:  Feels much better..  She has not had any further chest pains.  Breathing is better.  No fever, no nausea or vomiting.  Objective:  Vital Signs in the last 24 hours: Temp:  [98 F (36.7 C)-98.4 F (36.9 C)] 98 F (36.7 C) (05/23 0635) Pulse Rate:  [58-70] 62 (05/23 0635) Resp:  [0-29] 18 (05/23 0635) BP: (96-115)/(44-82) 103/51 mmHg (05/23 0635) SpO2:  [96 %-100 %] 100 % (05/23 0635) Weight:  [87.3 kg (192 lb 7.4 oz)] 87.3 kg (192 lb 7.4 oz) (05/23 0635)  Intake/Output from previous day: 05/22 0701 - 05/23 0700 In: 852 [P.O.:780; I.V.:72] Out: 225 [Urine:225]  Physical Exam: General appearance: alert, cooperative, appears stated age no acute distress Eyes: negative findings: lids and lashes normal Neck: Right facial droop evident.  JVP - elevated.  carotids 2+= without bruits Resp: Clear to auscultate.  Chest wall: no tenderness Cardio: S1, S2 normal, S3 gallop present, no click, no rub and No murmur GI: soft, non-tender; bowel sounds normal; no masses,  no organomegaly Extremities: edema 1 + bilateral below-knee pitting edema which is much improved and the skin is now shriveled due to loss of edema.  there is skin breakdown of both feet. Pulses: Bilateral carotid arteries normal, femoral pulses normal, popliteal pulse and pedal pulse absent. Skin: Skin breakdown of both feet evident. Has decub ulcer in her right ankle and dorsal foot on the left. Stable   Lab Results: BMP  Recent Labs  07/14/13 0327 07/15/13 0121 07/16/13 0345  NA 144 142 144  K 3.8 3.7 3.3*  CL 107 103 103  CO2 25 27 32  GLUCOSE 180* 318* 163*  BUN 16 18 20   CREATININE 0.95 1.07 1.12*  CALCIUM 8.7 8.9 8.6  GFRNONAA 58* 51* 48*  GFRAA 68* 59* 55*    CBC  Recent Labs Lab 07/13/13 1033  07/15/13 0121  WBC 5.8  < > 5.9  RBC 4.30  < > 4.15  HGB 11.1*  < > 10.6*  HCT 36.0  < > 35.1*  PLT 198  < > 175  MCV 83.7  < > 84.6  MCH 25.8*  < > 25.5*  MCHC 30.8  < > 30.2  RDW 14.7  < >  14.6  LYMPHSABS 1.5  --   --   MONOABS 0.6  --   --   EOSABS 0.1  --   --   BASOSABS 0.0  --   --   < > = values in this interval not displayed.  HEMOGLOBIN A1C Lab Results  Component Value Date   HGBA1C 7.0* 07/13/2013   MPG 154* 07/13/2013    Cardiac Panel (last 3 results)  Recent Labs  07/13/13 2100 07/14/13 0327 07/14/13 1336  TROPONINI 3.72* 3.03* 2.79*    BNP (last 3 results)  Recent Labs  07/14/13 0327 07/15/13 0121 07/16/13 0345  PROBNP 5556.0* 3819.0* 3503.0*    TSH No results found for this basename: TSH,  in the last 8760 hours  CHOLESTEROL  Recent Labs  07/14/13 0327  CHOL 171    Hepatic Function Panel No results found for this basename: PROT, ALBUMIN, AST, ALT, ALKPHOS, BILITOT, BILIDIR, IBILI,  in the last 8760 hours  Imaging: Imaging results have been reviewed  Cardiac Studies: Tele 07/15/2013: NSR. EKG: Normal sinus rhythm, normal axis. Inferior and lateral ST segment depression with nonspecific T. abnormality, cannot exclude inferior and lateral ischemia. Compared to prior EKG from 2014, ST segment changes are minimally worse.  Echocardiogram 07/14/2013: Moderate biventricular dilatation, severe global hypokinesis, markedly decreased left frontal systolic function, ejection fraction 25%, global hypokinesis, findings are consistent with dilated cardiomyopathy.  Some of the features are consistent with restrictive filling pattern.  Right ventricle is also dilated and hypokinetic.  RV systolic function also moderately depressed.  Echocardiogram 06/21/2012: Normal left systolic function, ejection fraction 55-60%.   Scheduled Meds: . albuterol  2.5 mg Nebulization q12n4p  . aspirin EC  81 mg Oral Daily  . atorvastatin  80 mg Oral q1800  . cholecalciferol  400 Units Oral Daily  . clopidogrel  75 mg Oral Q breakfast  . insulin aspart  0-15 Units Subcutaneous TID WC  . insulin aspart  11 Units Subcutaneous TID WC  . insulin glargine  20 Units  Subcutaneous QHS  . isosorbide-hydrALAZINE  1 tablet Oral TID  . lisinopril  5 mg Oral Daily  . metoprolol tartrate  25 mg Oral BID  . potassium chloride  10 mEq Oral BID  . silver sulfADIAZINE   Topical BID  . torsemide  20 mg Oral BID   Continuous Infusions:  PRN Meds:.acetaminophen, albuterol, nitroGLYCERIN, ondansetron (ZOFRAN) IV, oxyCODONE  Assessment/Plan  1. Non-ST elevation myocardial infarction, Type II troponin lead I suspect cardiomyopathy to be the etiology for ST elevation myocardial infarction, do not think there may have been significant progression of coronary artery disease to give global hypokinesis and also right ventricular dysfunction.  2. Coronary artery disease, history of CABG. Cardiac cath 2013: new lesion distal to LIMA insertion of the LAD which is about 80%. However this can be treated medically. 2012 coronary angio: SVG to RCA, LIMA to LAD, are patent. SVG to OM is occluded and this is old. 3.  Acute systolic and diastolic heart failure, abnormal echocardiogram findings are new compared to 06/21/2012. 4.  Dilated cardiomyopathy, etiology remains unknown at this time.  May need coronary angiography to exclude progression of coronary artery disease, but appears to be less likely given her presentation and biventricular failure. Also patient is very frail with multiple medical comorbidities and I would lean towards conservative therapy. 5. 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 Abnormal CT scan of the chest revealing retrosternal shadowing, nonspecific.  5. Diabetes mellitus type 2 controlled   Recommendation:   Would prefer her to go to SNF for rehab, patient willing. Change demadex to lasix q daily. Continue Plavix. Added Albuterol for bronchospasm and dyspnea. Full code.   [name], M.D. 07/16/2013, 11:30 AM [facility] Pager: [phone] Office: [phone] If no answer: [phone]

## 2013-07-17 LAB — BASIC METABOLIC PANEL
BUN: 18 mg/dL (ref 6–23)
CO2: 28 meq/L (ref 19–32)
Calcium: 8.7 mg/dL (ref 8.4–10.5)
Chloride: 103 mEq/L (ref 96–112)
Creatinine, Ser: 1.03 mg/dL (ref 0.50–1.10)
GFR calc Af Amer: 61 mL/min — ABNORMAL LOW (ref >90)
GFR calc non Af Amer: 53 mL/min — ABNORMAL LOW (ref >90)
GLUCOSE: 83 mg/dL (ref 70–99)
POTASSIUM: 3.5 meq/L — AB (ref 3.7–5.3)
Sodium: 143 mEq/L (ref 137–147)

## 2013-07-17 LAB — GLUCOSE, CAPILLARY
Glucose-Capillary: 89 mg/dL (ref 70–99)
Glucose-Capillary: 222 mg/dL — ABNORMAL HIGH (ref 70–99)
Glucose-Capillary: 88 mg/dL (ref 70–99)
Glucose-Capillary: 136 mg/dL — ABNORMAL HIGH (ref 70–99)

## 2013-07-17 LAB — MAGNESIUM: Magnesium: 1.6 mg/dL (ref 1.5–2.5)

## 2013-07-17 MED ORDER — MAGNESIUM SULFATE 40 MG/ML IJ SOLN
2.0000 g | Freq: Once | INTRAMUSCULAR | Status: AC
Start: 2013-07-17 — End: 2013-07-17
  Administered 2013-07-17: 2 g via INTRAVENOUS
  Filled 2013-07-17: qty 50

## 2013-07-17 MED ORDER — POTASSIUM CHLORIDE CRYS ER 20 MEQ PO TBCR
20.0000 meq | EXTENDED_RELEASE_TABLET | Freq: Two times a day (BID) | ORAL | Status: DC
  Administered 2013-07-18 – 2013-07-20 (×3): 20 meq via ORAL
  Filled 2013-07-17 (×7): qty 1

## 2013-07-17 MED ORDER — POTASSIUM CHLORIDE 10 MEQ/100ML IV SOLN
10.0000 meq | INTRAVENOUS | Status: AC
  Administered 2013-07-17 (×4): 10 meq via INTRAVENOUS
  Filled 2013-07-17 (×4): qty 100

## 2013-07-17 NOTE — Progress Notes (Signed)
Subjective:  Feels much better..  She has not had any further chest pains.  Breathing is better.  No fever, no nausea or vomiting. Had a 9 beat NSVT last night. Presently receiving potassium supplements.  Objective:  Vital Signs in the last 24 hours: Temp:  [98.5 F (36.9 C)-98.7 F (37.1 C)] 98.5 F (36.9 C) (05/24 0629) Pulse Rate:  [59-67] 59 (05/24 0629) Resp:  [18] 18 (05/24 0629) BP: (101-119)/(51-55) 101/52 mmHg (05/24 0629) SpO2:  [93 %-99 %] 93 % (05/24 0629) Weight:  [87.1 kg (192 lb 0.3 oz)] 87.1 kg (192 lb 0.3 oz) (05/24 0629)  Intake/Output from previous day:    Physical Exam: General appearance: alert, cooperative, appears stated age no acute distress Eyes: negative findings: lids and lashes normal Neck: Right facial droop evident.  JVP - elevated.  carotids 2+= without bruits Resp: Scattered bilateral crackles heard, improved with coughing. Chest wall: no tenderness Cardio: S1, S2 normal, S3 gallop present, no click, no rub and No murmur GI: soft, non-tender; bowel sounds normal; no masses,  no organomegaly Extremities: edema 1 + bilateral below-knee pitting edema which is much improved and the skin is now shriveled due to loss of edema.  there is skin breakdown of both feet. Pulses: Bilateral carotid arteries normal, femoral pulses normal, popliteal pulse and pedal pulse absent. Skin: Skin breakdown of both feet evident. Has decub ulcer in her right ankle and dorsal foot on the left. Stable   Lab Results: BMP  Recent Labs  07/15/13 0121 07/16/13 0345 07/17/13 0350  NA 142 144 143  K 3.7 3.3* 3.5*  CL 103 103 103  CO2 27 32 28  GLUCOSE 318* 163* 83  BUN 18 20 18   CREATININE 1.07 1.12* 1.03  CALCIUM 8.9 8.6 8.7  GFRNONAA 51* 48* 53*  GFRAA 59* 55* 61*    CBC  Recent Labs Lab 07/13/13 1033  07/15/13 0121  WBC 5.8  < > 5.9  RBC 4.30  < > 4.15  HGB 11.1*  < > 10.6*  HCT 36.0  < > 35.1*  PLT 198  < > 175  MCV 83.7  < > 84.6  MCH 25.8*  < > 25.5*   MCHC 30.8  < > 30.2  RDW 14.7  < > 14.6  LYMPHSABS 1.5  --   --   MONOABS 0.6  --   --   EOSABS 0.1  --   --   BASOSABS 0.0  --   --   < > = values in this interval not displayed.  HEMOGLOBIN A1C Lab Results  Component Value Date   HGBA1C 7.0* 07/13/2013   MPG 154* 07/13/2013    Cardiac Panel (last 3 results)  Recent Labs  07/13/13 2100 07/14/13 0327 07/14/13 1336  TROPONINI 3.72* 3.03* 2.79*    BNP (last 3 results)  Recent Labs  07/14/13 0327 07/15/13 0121 07/16/13 0345  PROBNP 5556.0* 3819.0* 3503.0*    TSH No results found for this basename: TSH,  in the last 8760 hours  CHOLESTEROL  Recent Labs  07/14/13 0327  CHOL 171    Hepatic Function Panel No results found for this basename: PROT, ALBUMIN, AST, ALT, ALKPHOS, BILITOT, BILIDIR, IBILI,  in the last 8760 hours  Imaging: Imaging results have been reviewed  Cardiac Studies: Tele 07/17/2013: NSR. Had one episode of nonsustained VT on 07/16/2013. No recurrence.  EKG on admission: Normal sinus rhythm, normal axis. Inferior and lateral ST segment depression with nonspecific T. abnormality, cannot exclude inferior  and lateral ischemia. Compared to prior EKG from 2014, ST segment changes are minimally worse.    Echocardiogram 07/14/2013: Moderate biventricular dilatation, severe global hypokinesis, markedly decreased left frontal systolic function, ejection fraction 25%, global hypokinesis, findings are consistent with dilated cardiomyopathy.  Some of the features are consistent with restrictive filling pattern.  Right ventricle is also dilated and hypokinetic.  RV systolic function also moderately depressed.  Echocardiogram 06/21/2012: Normal left systolic function, ejection fraction 55-60%.   Scheduled Meds: . albuterol  2.5 mg Nebulization q12n4p  . aspirin EC  81 mg Oral Daily  . atorvastatin  80 mg Oral q1800  . cholecalciferol  400 Units Oral Daily  . clopidogrel  75 mg Oral Q breakfast  . furosemide   20 mg Oral Daily  . insulin aspart  0-15 Units Subcutaneous TID WC  . insulin aspart  11 Units Subcutaneous TID WC  . insulin glargine  20 Units Subcutaneous QHS  . isosorbide-hydrALAZINE  1 tablet Oral TID  . lisinopril  5 mg Oral Daily  . magnesium sulfate 1 - 4 g bolus IVPB  2 g Intravenous Once  . metoprolol tartrate  25 mg Oral BID  . potassium chloride  20 mEq Oral BID  . silver sulfADIAZINE   Topical BID   Continuous Infusions:  PRN Meds:.acetaminophen, albuterol, nitroGLYCERIN, ondansetron (ZOFRAN) IV, oxyCODONE  Assessment/Plan  1. Non-ST elevation myocardial infarction, Type II troponin lead I suspect cardiomyopathy to be the etiology for ST elevation myocardial infarction, do not think there may have been significant progression of coronary artery disease to give global hypokinesis and also right ventricular dysfunction.  2. Coronary artery disease, history of CABG. Cardiac cath 2013: new lesion distal to LIMA insertion of the LAD which is about 80%. However this can be treated medically. 2012 coronary angio: SVG to RCA, LIMA to LAD, are patent. SVG to OM is occluded and this is old. 3. Acute systolic and diastolic heart failure, abnormal echocardiogram findings are new compared to 06/21/2012. 4. Dilated cardiomyopathy, etiology remains unknown at this time.   5. 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 Abnormal CT scan of the chest revealing retrosternal shadowing, nonspecific.  7. Diabetes mellitus type 2 controlled  8. one episode of nonsustained VT. Patient has severe left systolic dysfunction and also decreased RV function, however I do not think that she would be a candidate for ICD given her comorbidities.  Recommendation:   Would prefer her to go to SNF for rehab, patient willing. Replace magnesium, increase potassium supplements 20 mEq twice a day, patient is stable from cardiac standpoint to be transferred to skilled nursing facility when bed is available. We will  continue to watch her in the hospital over the weekend. Check TSH.  [name], M.D. 07/17/2013, 11:15 AM [facility] Pager: [phone] Office: [phone] If no answer: [phone]

## 2013-07-18 LAB — GLUCOSE, CAPILLARY
GLUCOSE-CAPILLARY: 150 mg/dL — AB (ref 70–99)
GLUCOSE-CAPILLARY: 158 mg/dL — AB (ref 70–99)
GLUCOSE-CAPILLARY: 166 mg/dL — AB (ref 70–99)
GLUCOSE-CAPILLARY: 77 mg/dL (ref 70–99)
Glucose-Capillary: 182 mg/dL — ABNORMAL HIGH (ref 70–99)

## 2013-07-18 LAB — BASIC METABOLIC PANEL
BUN: 17 mg/dL (ref 6–23)
CALCIUM: 8.9 mg/dL (ref 8.4–10.5)
CO2: 25 meq/L (ref 19–32)
Chloride: 108 mEq/L (ref 96–112)
Creatinine, Ser: 0.95 mg/dL (ref 0.50–1.10)
GFR calc Af Amer: 68 mL/min — ABNORMAL LOW (ref >90)
GFR, EST NON AFRICAN AMERICAN: 58 mL/min — AB (ref >90)
GLUCOSE: 126 mg/dL — AB (ref 70–99)
POTASSIUM: 4.4 meq/L (ref 3.7–5.3)
Sodium: 143 mEq/L (ref 137–147)

## 2013-07-18 LAB — TSH: TSH: 2.5 u[IU]/mL (ref 0.350–4.500)

## 2013-07-18 NOTE — Progress Notes (Signed)
Pt refused  Rt foot dressing x 3  Care endorsed

## 2013-07-18 NOTE — Progress Notes (Signed)
Pt refused 10:00 po medications  stating can't  Swallow  it now .MD Ganji   notified .  Marland Kitchen RN wanted to call SPL to reevaluate pt as pt was drooling a lot this am and made aware that that is pt baseline.  Rn will offer medication  A third time later. Carin Hock RN

## 2013-07-18 NOTE — Progress Notes (Signed)
Pt took all her morning medication this pm at 1320 with less difficulty. SL Pstated will follow up with  pt later . Tolerated lunch okay with staff supervision.

## 2013-07-18 NOTE — Progress Notes (Signed)
Pt had 8 beat run non sustained VT, MD made aware and no new orders given. Will continue to monitor pt.   [place] RN

## 2013-07-18 NOTE — Progress Notes (Signed)
Speech Language Pathology Treatment: Dysphagia  Patient Details Name: Iya Hamed MRN: [id number] DOB: 07-08-1940 Today's Date: 07/18/2013 Time: 6226-3335 SLP Time Calculation (min): 25 min  Assessment / Plan / Recommendation Clinical Impression  Pt function remains at baseline. She has developed complex compensatory strategies for dysphagia; ex: protruding tongue on right to block anterior spillage. Pt noted to cough consistently, SLP asked, "Why are you getting choked every time?" Pt wrote "because I didn't tuck my chin." All consecutive trials pt did not cough when chin was tucked. Pt also expressed that she does better when she can choose her meals and have whole meats to place on her good side to chew. Will upgrade pts diet to regular/thin to allow her freedom of choice. We discussed having her write her requests down for the ambassador in advance. She was left looking at the menu. No SLP f/u needed, will sign off.    HPI HPI: 73 year old female admitted 07/13/13 due to chest pain x1 month.  PMH significant for CVA (4562,5638) with aphasia, dense right hemi, DM, HTN, CAD s/p CABG, NSTEMI, PNA, GERD, EGD (2013). CXR 07/14/13 revealed no focal consolidation. BSE ordered to evaluate swallow function and safety   Pertinent Vitals NA  SLP Plan  All goals met    Recommendations Diet recommendations: Regular;Thin liquid Liquids provided via: Cup Medication Administration: Whole meds with liquid Supervision: Staff to assist with self feeding;Intermittent supervision to cue for compensatory strategies Compensations: Slow rate;Small sips/bites Postural Changes and/or Swallow Maneuvers: Chin tuck;Seated upright 90 degrees              Plan: All goals met    GO    Herbie [place], MA CCC-SLP [phone]  Katherene Ponto Alford Gamero 07/18/2013, 4:08 PM

## 2013-07-18 NOTE — Progress Notes (Signed)
Subjective:  Feels much better..  She has not had any further chest pains.  Breathing is better.  No fever, no nausea or vomiting. No further episodes of NSVT on telemetry. Willing to go to SNF  Objective:  Vital Signs in the last 24 hours: Temp:  [98.2 F (36.8 C)-98.4 F (36.9 C)] 98.4 F (36.9 C) (05/25 1418) Pulse Rate:  [68-77] 73 (05/25 1418) Resp:  [17-19] 17 (05/25 1418) BP: (112-137)/(50-62) 112/50 mmHg (05/25 1418) SpO2:  [96 %-100 %] 97 % (05/25 1418)  Intake/Output from previous day: 05/24 0701 - 05/25 0700 In: 350 [P.O.:350] Out: -   Physical Exam: General appearance: alert, cooperative, appears stated age no acute distress Eyes: negative findings: lids and lashes normal Neck: Right facial droop evident.  JVP - Normal carotids 2+= without bruits Resp:  Clear Chest wall: no tenderness Cardio: S1, S2 normal, No gallop present, no click, no rub and No murmur GI: soft, non-tender; bowel sounds normal; no masses,  no organomegaly Extremities: edema 1 + bilateral below-knee pitting edema which is much improved and the skin is now shriveled due to loss of edema.  there is skin breakdown of both feet. Dense left hemiplegia (old) Pulses: Bilateral carotid arteries normal, femoral pulses normal, popliteal pulse and pedal pulse absent. Skin: Skin breakdown of both feet evident. Has decub ulcer in her right ankle and dorsal foot on the left. Stable   Lab Results: BMP  Recent Labs  07/16/13 0345 07/17/13 0350 07/18/13 0330  NA 144 143 143  K 3.3* 3.5* 4.4  CL 103 103 108  CO2 32 28 25  GLUCOSE 163* 83 126*  BUN 20 18 17   CREATININE 1.12* 1.03 0.95  CALCIUM 8.6 8.7 8.9  GFRNONAA 48* 53* 58*  GFRAA 55* 61* 68*    CBC  Recent Labs Lab 07/13/13 1033  07/15/13 0121  WBC 5.8  < > 5.9  RBC 4.30  < > 4.15  HGB 11.1*  < > 10.6*  HCT 36.0  < > 35.1*  PLT 198  < > 175  MCV 83.7  < > 84.6  MCH 25.8*  < > 25.5*  MCHC 30.8  < > 30.2  RDW 14.7  < > 14.6  LYMPHSABS  1.5  --   --   MONOABS 0.6  --   --   EOSABS 0.1  --   --   BASOSABS 0.0  --   --   < > = values in this interval not displayed.  HEMOGLOBIN A1C Lab Results  Component Value Date   HGBA1C 7.0* 07/13/2013   MPG 154* 07/13/2013    Cardiac Panel (last 3 results)  Recent Labs  07/13/13 2100 07/14/13 0327 07/14/13 1336  TROPONINI 3.72* 3.03* 2.79*    BNP (last 3 results)  Recent Labs  07/14/13 0327 07/15/13 0121 07/16/13 0345  PROBNP 5556.0* 3819.0* 3503.0*    TSH  Recent Labs  07/18/13 0330  TSH 2.500    CHOLESTEROL  Recent Labs  07/14/13 0327  CHOL 171    Hepatic Function Panel No results found for this basename: PROT, ALBUMIN, AST, ALT, ALKPHOS, BILITOT, BILIDIR, IBILI,  in the last 8760 hours  Imaging: Imaging results have been reviewed  Cardiac Studies: Tele 07/17/2013: NSR. Had one episode of nonsustained VT on 07/16/2013. No recurrence.  EKG on admission: Normal sinus rhythm, normal axis. Inferior and lateral ST segment depression with nonspecific T. abnormality, cannot exclude inferior and lateral ischemia. Compared to prior EKG from 2014, ST  segment changes are minimally worse.    Echocardiogram 07/14/2013: Moderate biventricular dilatation, severe global hypokinesis, markedly decreased left frontal systolic function, ejection fraction 25%, global hypokinesis, findings are consistent with dilated cardiomyopathy.  Some of the features are consistent with restrictive filling pattern.  Right ventricle is also dilated and hypokinetic.  RV systolic function also moderately depressed.  Echocardiogram 06/21/2012: Normal left systolic function, ejection fraction 55-60%.   Scheduled Meds: . albuterol  2.5 mg Nebulization q12n4p  . aspirin EC  81 mg Oral Daily  . atorvastatin  80 mg Oral q1800  . cholecalciferol  400 Units Oral Daily  . clopidogrel  75 mg Oral Q breakfast  . furosemide  20 mg Oral Daily  . insulin aspart  0-15 Units Subcutaneous TID WC  .  insulin aspart  11 Units Subcutaneous TID WC  . insulin glargine  20 Units Subcutaneous QHS  . isosorbide-hydrALAZINE  1 tablet Oral TID  . lisinopril  5 mg Oral Daily  . metoprolol tartrate  25 mg Oral BID  . potassium chloride  20 mEq Oral BID  . silver sulfADIAZINE   Topical BID   Continuous Infusions:  PRN Meds:.acetaminophen, albuterol, nitroGLYCERIN, ondansetron (ZOFRAN) IV, oxyCODONE  Assessment/Plan  1. Non-ST elevation myocardial infarction, Type II troponin leak, I suspect cardiomyopathy to be the etiology for ST elevation myocardial infarction, do not think there may have been significant progression of coronary artery disease to give global hypokinesis and also right ventricular dysfunction.  2. Coronary artery disease, history of CABG. Cardiac cath 2013: new lesion distal to LIMA insertion of the LAD which is about 80%. However this can be treated medically. 2012 coronary angio: SVG to RCA, LIMA to LAD, are patent. SVG to OM is occluded and this is old. 3. Acute systolic and diastolic heart failure, abnormal echocardiogram findings are new compared to 06/21/2012. 4. Dilated cardiomyopathy, etiology remains unknown at this time.   5. 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 Abnormal CT scan of the chest revealing retrosternal shadowing, nonspecific.  7. Diabetes mellitus type 2 controlled  8. No  Further episodes of nonsustained VT. Patient has severe left systolic dysfunction and also decreased RV function, however I do not think that she would be a candidate for ICD given her comorbidities.  Recommendation:   Would prefer her to go to SNF for rehab, patient willing. Overall potassium levels normal today. No changes in medications made. In well compensated CHF. D/C tomorrow if beds available.  [name], M.D. 07/18/2013, 6:48 PM [facility] Pager: [phone] Office: [phone] If no answer: [phone]

## 2013-07-19 ENCOUNTER — Other Ambulatory Visit: Payer: Medicare HMO

## 2013-07-19 LAB — GLUCOSE, CAPILLARY
GLUCOSE-CAPILLARY: 122 mg/dL — AB (ref 70–99)
GLUCOSE-CAPILLARY: 75 mg/dL (ref 70–99)
GLUCOSE-CAPILLARY: 174 mg/dL — AB (ref 70–99)
Glucose-Capillary: 87 mg/dL (ref 70–99)

## 2013-07-19 NOTE — Progress Notes (Signed)
Clinical Social Work Department BRIEF PSYCHOSOCIAL ASSESSMENT 07/19/2013  Patient:  [name] [name]     Account Number:  [id number]     Admit date:  07/13/2013  Clinical Social Worker:  Megan Salon  Date/Time:  07/19/2013 11:33 AM  Referred by:  Physician  Date Referred:  07/19/2013 Referred for  SNF Placement   Other Referral:   Interview type:  Other - See comment Other interview type:   CSW spoke to patient and daughter by bedside    PSYCHOSOCIAL DATA Living Status:  FAMILY Admitted from facility:   Level of care:   Primary support name:  [name] [name] Primary support relationship to patient:  CHILD, ADULT Degree of support available:   Good    CURRENT CONCERNS Current Concerns  Post-Acute Placement   Other Concerns:    SOCIAL WORK ASSESSMENT / PLAN Clinical Social Worker received referral for SNF placement at d/c. CSW introduced self and explained reason for visit. Patient had visitor by bedside, patient's daughter. CSW explained SNF process to patient's daughter. Patient's daughter states that patient was at [facility] before and they do not want patient to go back there. Patient reported she is agreeable for SNF placement.  CSW will complete FL2 for MD's signature and will update patient and family when bed offers are received.   Assessment/plan status:  Psychosocial Support/Ongoing Assessment of Needs Other assessment/ plan:   Information/referral to community resources:   SNF information    PATIENT'S/FAMILY'S RESPONSE TO PLAN OF CARE: Patient states she is agreeable to be faxed out to [facility] facilities.        [name] [name], MSW, [place]

## 2013-07-19 NOTE — Progress Notes (Addendum)
Update: Daughter called social worker back and states they would like to go with [facility]. Daughter states that she is going to go by the facility this afternoon and call social worker back tonight.   CSW provided bed offer to patient's daughter over the phone. Daughter states she is going to speak with family and inform social worker of choice.  Jeanette Caprice, MSW, [place]

## 2013-07-19 NOTE — Evaluation (Signed)
Physical Therapy Evaluation Patient Details Name: [name] [name] MRN: [id number] DOB: 12/16/1940 Today's Date: 07/19/2013   History of Present Illness  Pt admitted with NSTEMI, pt with CVA 81yrs ago with right hemiplegia and expressive aphasia  Clinical Impression  Pt very pleasant and communicating with writing and grunting to questions. Pt with rapid decline in function over last month per pt report and dgtr unable to fully care for her at this time. Pt would benefit from SNF as well as acute therapy to maximize mobility, transfers and function to return pt to being able to transfer without assist. Recommend OOB daily during stay with lift until AFO arrives    Follow Up Recommendations SNF;Supervision/Assistance - 24 hour    Equipment Recommendations  None recommended by PT    Recommendations for Other Services       Precautions / Restrictions Precautions Precautions: Fall Precaution Comments: aphasia, right hemiplegia, right foot wound from dropping boiling H20 on foot in Dec Required Braces or Orthoses: Other Brace/Splint Other Brace/Splint: pt typically wears Right AFO for transfers to chair but currently not present in room      Mobility  Bed Mobility Overal bed mobility: Needs Assistance Bed Mobility: Rolling;Sidelying to Sit;Sit to Sidelying Rolling: Min assist Sidelying to sit: Min assist     Sit to sidelying: Min assist General bed mobility comments: pt able to use LUE and rail to assist with rolling with assist of pad to complete translation fully onto side with assist for pericare and linen change as pt incontinent on arrival. Pt with assist to bring bil LE off of bed and elevate trunk from surface for sitting. Assist for legs onto surface with return to side. Pt denied attempting transfer to chair due to lack of AFO present  Transfers                    Ambulation/Gait                Stairs            Wheelchair Mobility    Modified  Rankin (Stroke Patients Only)       Balance Overall balance assessment: Needs assistance   Sitting balance-Leahy Scale: Poor                                       Pertinent Vitals/Pain No pain VSS    Home Living Family/patient expects to be discharged to:: Private residence Living Arrangements: [place]: [place] Rile - 2 wheels;Cane - quad;Shower seat;Wheelchair - Education officer, community - power;Hospital bed      Prior Function Level of Independence: Needs assistance      ADL's / Homemaking Assistance Needed: dgtr performing all housework and assisting with ADLs since weakness and MI  Comments: pt was transferring to power [facility] and caring for herself until about a month ago. Dgtr is now her caregiver and assists with all transfers and ADLs     Hand Dominance   Dominant Hand: Left    Extremity/Trunk Assessment   Upper Extremity Assessment: RUE deficits/detail RUE Deficits / Details: long standing hemiplegia with flexion contracture         Lower Extremity Assessment: RLE deficits/detail RLE Deficits / Details: pt with grossly 2-/5 movement in RLE with use of LLE to assist with movement to EOB  Communication   Communication: Expressive difficulties  Cognition Arousal/Alertness: Awake/alert Behavior During Therapy: WFL for tasks assessed/performed Overall Cognitive Status: Within Functional Limits for tasks assessed                      General Comments      Exercises        Assessment/Plan    PT Assessment Patient needs continued PT services  PT Diagnosis Generalized weakness;Hemiplegia non-dominant side   PT Problem List Decreased strength;Decreased activity tolerance;Decreased range of motion;Decreased knowledge of use of DME;Decreased balance;Decreased mobility  PT Treatment Interventions Functional mobility training;Therapeutic activities;Therapeutic exercise;Patient/family education;Balance  training;Neuromuscular re-education   PT Goals (Current goals can be found in the Care Plan section) Acute Rehab PT Goals Patient Stated Goal: be able to get up and around PT Goal Formulation: With patient Time For Goal Achievement: 08/02/13 Potential to Achieve Goals: Fair    Frequency Min 2X/week   Barriers to discharge Decreased caregiver support      Co-evaluation               End of Session   Activity Tolerance: Patient tolerated treatment well Patient left: in bed;with call bell/phone within reach Nurse Communication: Mobility status;Precautions         Time: 3382-5053 PT Time Calculation (min): 30 min   Charges:   PT Evaluation $Initial PT Evaluation Tier I: 1 Procedure PT Treatments $Therapeutic Activity: 23-37 mins   PT G Codes:          Bellamie Turney B Ashantae Pangallo 07/19/2013, 12:45 PM Elwyn Reach, [place]

## 2013-07-19 NOTE — Progress Notes (Signed)
07-19-13 1242 Medicare IM signed by patient and signed copy left on chart. Ocie Cornfield [place], RN,BSN [phone]

## 2013-07-19 NOTE — Progress Notes (Addendum)
Subjective:  Feels much better..    Breathing is better.  No fever, no nausea or vomiting. No further episodes of NSVT on telemetry. Willing to go to SNF. Awaiting placement.  Objective:  Vital Signs in the last 24 hours: Temp:  [98.1 F (36.7 C)-98.7 F (37.1 C)] 98.1 F (36.7 C) (05/26 2706) Pulse Rate:  [61-77] 61 (05/26 1427) Resp:  [17-18] 17 (05/26 1427) BP: (113-129)/(64-75) 113/67 mmHg (05/26 1427) SpO2:  [97 %-100 %] 98 % (05/26 1427) Weight:  [87.9 kg (193 lb 12.6 oz)] 87.9 kg (193 lb 12.6 oz) (05/26 2376)  Intake/Output from previous day: 05/25 0701 - 05/26 0700 In: 8 [P.O.:960] Out: -   Physical Exam: General appearance: alert, cooperative, appears stated age no acute distress Eyes: negative findings: lids and lashes normal Neck: Right facial droop evident.  JVP - Normal carotids 2+= without bruits Resp:  Clear Chest wall: no tenderness Cardio: S1, S2 normal, No gallop present, no click, no rub and No murmur GI: soft, non-tender; bowel sounds normal; no masses,  no organomegaly Extremities: edema trace bilateral below-knee pitting edema which is much improved and the skin is now shriveled due to loss of edema.  there is skin breakdown of both feet. Dense left hemiplegia (old) Pulses: Bilateral carotid arteries normal, femoral pulses normal, popliteal pulse and pedal pulse absent. Skin: Skin breakdown of both feet evident. Has decub ulcer in her right ankle and dorsal foot on the left. Stable   Lab Results: BMP  Recent Labs  07/16/13 0345 07/17/13 0350 07/18/13 0330  NA 144 143 143  K 3.3* 3.5* 4.4  CL 103 103 108  CO2 32 28 25  GLUCOSE 163* 83 126*  BUN 20 18 17   CREATININE 1.12* 1.03 0.95  CALCIUM 8.6 8.7 8.9  GFRNONAA 48* 53* 58*  GFRAA 55* 61* 68*    CBC  Recent Labs Lab 07/13/13 1033  07/15/13 0121  WBC 5.8  < > 5.9  RBC 4.30  < > 4.15  HGB 11.1*  < > 10.6*  HCT 36.0  < > 35.1*  PLT 198  < > 175  MCV 83.7  < > 84.6  MCH 25.8*  < >  25.5*  MCHC 30.8  < > 30.2  RDW 14.7  < > 14.6  LYMPHSABS 1.5  --   --   MONOABS 0.6  --   --   EOSABS 0.1  --   --   BASOSABS 0.0  --   --   < > = values in this interval not displayed.  HEMOGLOBIN A1C Lab Results  Component Value Date   HGBA1C 7.0* 07/13/2013   MPG 154* 07/13/2013    Cardiac Panel (last 3 results)  Recent Labs  07/13/13 2100 07/14/13 0327 07/14/13 1336  TROPONINI 3.72* 3.03* 2.79*    BNP (last 3 results)  Recent Labs  07/14/13 0327 07/15/13 0121 07/16/13 0345  PROBNP 5556.0* 3819.0* 3503.0*    TSH  Recent Labs  07/18/13 0330  TSH 2.500    CHOLESTEROL  Recent Labs  07/14/13 0327  CHOL 171    Hepatic Function Panel No results found for this basename: PROT, ALBUMIN, AST, ALT, ALKPHOS, BILITOT, BILIDIR, IBILI,  in the last 8760 hours  Imaging: Imaging results have been reviewed  Cardiac Studies:  Tele 07/19/2013: NSR Tele 07/17/2013: NSR. Had one episode of nonsustained VT on 07/16/2013. No recurrence.  EKG on admission: Normal sinus rhythm, normal axis. Inferior and lateral ST segment depression with nonspecific T.  abnormality, cannot exclude inferior and lateral ischemia. Compared to prior EKG from 2014, ST segment changes are minimally worse.    Echocardiogram 07/14/2013: Moderate biventricular dilatation, severe global hypokinesis, markedly decreased left ventricular systolic function, ejection fraction 25%, global hypokinesis, findings are consistent with dilated cardiomyopathy.  Some of the features are consistent with restrictive filling pattern.  Right ventricle is also dilated and hypokinetic.  RV systolic function also moderately depressed.  Echocardiogram 06/21/2012: Normal left systolic function, ejection fraction 55-60%.   Scheduled Meds: . albuterol  2.5 mg Nebulization q12n4p  . aspirin EC  81 mg Oral Daily  . atorvastatin  80 mg Oral q1800  . cholecalciferol  400 Units Oral Daily  . clopidogrel  75 mg Oral Q breakfast   . furosemide  20 mg Oral Daily  . insulin aspart  0-15 Units Subcutaneous TID WC  . insulin aspart  11 Units Subcutaneous TID WC  . insulin glargine  20 Units Subcutaneous QHS  . isosorbide-hydrALAZINE  1 tablet Oral TID  . lisinopril  5 mg Oral Daily  . metoprolol tartrate  25 mg Oral BID  . potassium chloride  20 mEq Oral BID  . silver sulfADIAZINE   Topical BID   Continuous Infusions:  PRN Meds:.acetaminophen, albuterol, nitroGLYCERIN, ondansetron (ZOFRAN) IV, oxyCODONE  Assessment/Plan  1. Non-ST elevation myocardial infarction, Type II troponin leak, I suspect cardiomyopathy to be the etiology for ST elevation myocardial infarction, do not think there may have been significant progression of coronary artery disease to give global hypokinesis and also right ventricular dysfunction.  2. Coronary artery disease, history of CABG. Cardiac cath 2013: new lesion distal to LIMA insertion of the LAD which is about 80%. However this can be treated medically. 2012 coronary angio: SVG to RCA, LIMA to LAD, are patent. SVG to OM is occluded and this is old. 3. Acute systolic and diastolic heart failure, abnormal echocardiogram findings are new compared to 06/21/2012. 4. Dilated cardiomyopathy, etiology remains unknown at this time.   5. 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 Abnormal CT scan of the chest revealing retrosternal shadowing, nonspecific.  7. Diabetes mellitus type 2 controlled  8. No  Further episodes of nonsustained VT. Patient has severe left systolic dysfunction and also decreased RV function, however I do not think that she would be a candidate for ICD given her comorbidities.  Recommendation:  Awaiting placement. Doing well and in compensated CHF. D/C when beds available.  [name], M.D. 07/19/2013, 6:10 PM [facility], [place] Pager: [phone] Office: [phone] If no answer: [phone]

## 2013-07-19 NOTE — Progress Notes (Addendum)
Clinical Social Work Department CLINICAL SOCIAL WORK PLACEMENT NOTE 07/19/2013  Patient:  [name],[name]  Account Number:  [id number] [place] date:  07/13/2013  Clinical Social Worker:  Megan Salon  Date/time:  07/19/2013 11:36 AM  Clinical Social Work is seeking post-discharge placement for this patient at the following level of care:   [place]   (*CSW will update this form in Epic as items are completed)   07/19/2013  Patient/family provided with [place] Department of Clinical Social Work's list of facilities offering this level of care within the geographic area requested by the patient (or if unable, by the patient's family).  07/19/2013  Patient/family informed of their freedom to choose among providers that offer the needed level of care, that participate in Medicare, Medicaid or managed care program needed by the patient, have an available bed and are willing to accept the patient.  07/19/2013  Patient/family informed of [facility]' ownership interest [facility], as well as of the fact that they are under no obligation to receive care at this facility.  PASARR submitted to EDS on 07/20/2013 PASARR number received from EDS on 07/20/2013  FL2 transmitted to all facilities in geographic area requested by pt/family on  07/19/2013 FL2 transmitted to all facilities within larger geographic area on   Patient informed that his/her managed care company has contracts with or will negotiate with  certain facilities, including the following:     Patient/family informed of bed offers received:   Patient chooses bed at [facility] Physician recommends and patient chooses bed at    Patient to be transferred to  on  07/20/2013 Patient to be transferred to facility by PTAR  The following physician request were entered in Epic:   Additional Comments: Jeanette Caprice, [place], [place]

## 2013-07-20 LAB — GLUCOSE, CAPILLARY
GLUCOSE-CAPILLARY: 167 mg/dL — AB (ref 70–99)
Glucose-Capillary: 220 mg/dL — ABNORMAL HIGH (ref 70–99)
Glucose-Capillary: 91 mg/dL (ref 70–99)

## 2013-07-20 MED ORDER — ESCITALOPRAM OXALATE 10 MG PO TABS
10.0000 mg | ORAL_TABLET | Freq: Every day | ORAL | Status: DC
  Administered 2013-07-20: 10 mg via ORAL
  Filled 2013-07-20: qty 1

## 2013-07-20 MED ORDER — ISOSORB DINITRATE-HYDRALAZINE 20-37.5 MG PO TABS: 1.0000 | ORAL_TABLET | Freq: Three times a day (TID) | ORAL | Status: DC

## 2013-07-20 MED ORDER — LISINOPRIL 5 MG PO TABS: 5.0000 mg | ORAL_TABLET | Freq: Every day | ORAL | Status: DC

## 2013-07-20 MED ORDER — ALUM & MAG HYDROXIDE-SIMETH 200-200-20 MG/5ML PO SUSP
30.0000 mL | ORAL | Status: DC | PRN
  Filled 2013-07-20: qty 30

## 2013-07-20 MED ORDER — ASPIRIN 81 MG PO TBEC: 81.0000 mg | DELAYED_RELEASE_TABLET | Freq: Every day | ORAL | Status: DC

## 2013-07-20 MED ORDER — POTASSIUM CHLORIDE CRYS ER 20 MEQ PO TBCR: 20.0000 meq | EXTENDED_RELEASE_TABLET | Freq: Every day | ORAL | Status: DC

## 2013-07-20 MED ORDER — METOPROLOL TARTRATE 25 MG PO TABS: 25.0000 mg | ORAL_TABLET | Freq: Two times a day (BID) | ORAL | Status: DC

## 2013-07-20 NOTE — Evaluation (Signed)
Occupational Therapy Evaluation Patient Details Name: [name] [name] MRN: [id number] DOB: 04/27/1940 Today's Date: 07/20/2013    History of Present Illness Pt admitted with NSTEMI, pt with CVA 66yrs ago with right hemiplegia and expressive aphasia   Clinical Impression   Pt demonstrates decline in function and safety with decreased strength, balance and endurance with hx of CVA with R hemiplegia. Pt would benefit from acute OT services to address impairments to increase level of function and safety    Follow Up Recommendations  SNF;Supervision/Assistance - 24 hour    Equipment Recommendations   TBD at next venue of care   Recommendations for Other Services       Precautions / Restrictions Precautions Precautions: Fall Precaution Comments: aphasia, right hemiplegia, right foot wound from dropping boiling H20 on foot in Dec Required Braces or Orthoses: Other Brace/Splint Other Brace/Splint: pt typically wears Right AFO for transfers to chair but currently not present in room Restrictions Weight Bearing Restrictions: No      Mobility Bed Mobility Overal bed mobility: Needs Assistance Bed Mobility: Rolling;Sidelying to Sit;Sit to Sidelying Rolling: Min assist Sidelying to sit: Min assist     Sit to sidelying: Min assist General bed mobility comments: pt able to use LUE and rail to assist with rolling with assist of pad to complete translation fully onto side with assist for pericare and linen change as pt incontinent on arrival. Pt with assist to bring bil LE off of bed and elevate trunk from surface for sitting. Assist for legs onto surface with return to side.   Transfers                 General transfer comment: Pt delcined transfers due to not having AFO    Balance Overall balance assessment: Needs assistance Sitting-balance support: Single extremity supported;Feet supported Sitting balance-Leahy Scale: Poor         Standing balance comment: pt declined sit  - stand                            ADL Overall ADL's : Needs assistance/impaired     Grooming: Wash/dry hands;Wash/dry face;Minimal assistance;Sitting   Upper Body Bathing: Moderate assistance;Sitting   Lower Body Bathing: Maximal assistance;Sitting/lateral leans;Total assistance   Upper Body Dressing : Moderate assistance;Sitting   Lower Body Dressing: Total assistance     Toilet Transfer Details (indicate cue type and reason): Pt delcined transfers due to not having AFO           General ADL Comments: pt has assist at home with ADLs, however unable to determine how much at PLOF vs currently. Pt sat EOB x 12 minutes     Vision  no change from baseline per pt report                              Pertinent Vitals/Pain No c/o pain, VSS     Hand Dominance Left   Extremity/Trunk Assessment Upper Extremity Assessment Upper Extremity Assessment: RUE deficits/detail RUE Deficits / Details: long standing hemiplegia with flexion contracture   Lower Extremity Assessment RLE Deficits / Details: pt with grossly 2-/5 movement in RLE with use of LLE to assist with movement to EOB       Communication Communication Communication: Expressive difficulties   Cognition Arousal/Alertness: Awake/alert Behavior During Therapy: WFL for tasks assessed/performed Overall Cognitive Status: Within Functional Limits for tasks assessed  General Comments   Pt pleasant and cooperative    Exercises   Other Exercises Other Exercises: AA/PROM of R UE in all planes while sitting EOB         Home Living Family/patient expects to be discharged to:: Skilled nursing facility Living Arrangements: Children   Type of Home: Apartment Home Access: Level entry     Home Layout: One level     Bathroom Shower/Tub: Teacher, early years/pre: Standard     Home Equipment: Environmental consultant - 2 wheels;Cane - quad;Shower seat;Wheelchair -  Education officer, community - power;Hospital bed          Prior Functioning/Environment Level of Independence: Needs assistance    ADL's / Homemaking Assistance Needed: dgtr performing all housework and assisting with ADLs since weakness and MI   Comments: pt was transferring to power [facility] and caring for herself until about a month ago. Dgtr is now her caregiver and assists with all transfers and ADLs    OT Diagnosis: Generalized weakness   OT Problem List: Decreased strength;Decreased range of motion;Impaired UE functional use;Impaired balance (sitting and/or standing);Decreased activity tolerance   OT Treatment/Interventions: Self-care/ADL training;Therapeutic exercise;Patient/family education;Neuromuscular education;Balance training;Therapeutic activities    OT Goals(Current goals can be found in the care plan section) Acute Rehab OT Goals Patient Stated Goal: to get better OT Goal Formulation: With patient Time For Goal Achievement: 07/27/13 Potential to Achieve Goals: Good  OT Frequency: Min 2X/week   Barriers to D/C: Decreased caregiver support                        End of Session    Activity Tolerance: Patient limited by fatigue Patient left: in bed;with bed alarm set;with call bell/phone within reach   Time: 0938-1000 OT Time Calculation (min): 22 min Charges:  OT General Charges $OT Visit: 1 Procedure OT Evaluation $Initial OT Evaluation Tier I: 1 Procedure OT Treatments $Therapeutic Activity: 8-22 mins G-Codes:    Mosetta Putt 07/23/2013, 1:23 PM

## 2013-07-20 NOTE — Progress Notes (Signed)
Clinical social worker assisted with patient discharge to skilled nursing facility, [facility].  CSW addressed all family questions and concerns. CSW copied chart and added all important documents. CSW also set up patient transportation with [facility]. Clinical Social Worker will sign off for now as social work intervention is no longer needed.   Belinda Bringhurst, MSW, LCSWA [phone] 

## 2013-07-20 NOTE — Progress Notes (Signed)
Pt discharged to SNF ([facility]) [name] [name] (the receiving nurse at [facility]) but was unable to get an answer/call back to give report. Pt alert and oriented, denies pain. Wound to right foot (present PTA) Dressing dry and intact. IV d/ced, site unremarkable. Vitals stable, no pain or complaints. PTAR has been set up by CSW and daughter has been notified.

## 2013-07-20 NOTE — Discharge Summary (Signed)
Physician Discharge Summary  Patient ID: [name] [name] MRN: [id number] DOB/AGE: 73-Mar-1942 73 y.o.  Admit date: 07/13/2013 Discharge date: 07/20/2013  Primary Discharge Diagnosis 1. Non-ST elevation myocardial infarction, 2 troponin leak 2. Dilated cardiomyopathy with severe left systolic dysfunction and right ventricle systolic dysfunction  Secondary Discharge Diagnosis Old stroke with dense left hemiplegia Diabetes mellitus type 2 controlled Hypertension Hyperlipidemia Anemia of chronic disease Nonsustained ventricular tachycardia in the setting of hypokalemia and low LVEF, patient had a candidate for ICD implantation Decubitus ulcers bilateral foot.   Consults: Speech/swallow evaluation, felt that it was safe to resume diet, although patient had significant issues clinically, patient had developed a complex swallowing compensation.  Significant Diagnostic Studies:  Tele 07/17/2013: NSR. Had one episode of nonsustained VT on 07/16/2013. No recurrence.  EKG on admission: Normal sinus rhythm, normal axis. Inferior and lateral ST segment depression with nonspecific T. abnormality, cannot exclude inferior and lateral ischemia. Compared to prior EKG from 2014, ST segment changes are minimally worse.    Echocardiogram 07/14/2013: Moderate biventricular dilatation, severe global hypokinesis, markedly decreased left ventricular systolic function, ejection fraction 25%, global hypokinesis, findings are consistent with dilated cardiomyopathy.  Some of the features are consistent with restrictive filling pattern.  Right ventricle is also dilated and hypokinetic.  RV systolic function also moderately depressed.  Echocardiogram 06/21/2012: Normal left systolic function, ejection fraction 55-60%   Hospital Course: Patient is a 73 year old African American female with history of remote dense right hemiparesis, diabetes mellitus, hypertension, history of known coronary artery disease and remote CABG,  who I had last seen in January of 2013, when she presented with non-ST elevation myocardial infarction. At that time coronary angiography revealed mild progression of disease, was recommended medical therapy.   I had not seen her since then. She presented to the emergency room complaining about chest pain ongoing for the past one month. Patient's symptoms have been worse over the past 2 days, due to recurrent chest pain especially yesterday and today she presented to the emergency room where on evaluation, she was found to have positive troponins. She has also been complaining shortness of breath and dyspnea on exertion which has been getting worse for the past 2 weeks.   Over the past 1 month patient has been treated for pneumonia in the outpatient basis with antibiotics. Patient is nonverbal, however she's a retired Radio producer, able to communicate by writing and also able to make grunting noises when questions are asked. Her daughter is present at the bedside. Patient denies any other associated symptoms. Denies any dark stools, denies fever, but has noticed that she's been coughing fairly frequently and occasionally bringing up yellowish sputum. No fever or chills.  She is also developed ulcerations in her feet over the past few weeks to few months. Patient lives at home and is cared for by her daughter.   Patient was initially planned on for coronary angiography, however on evaluation of the patient, she was in edema and florid congestive heart failure and ejection fraction confirmed new onset of cardiomyopathy. Hence the coronary she was canceled, and conservative therapy was planned, patient was transferred to step down bed in the hospital due to the need for acute care because of congestive heart failure, old stroke.  She also underwent diuresis. She had significant edema in her legs which are resolved. Suspicion for pneumonia was very low. It was all felt that the symptoms are related to acute on  chronic systolic and diastolic heart failure.  Recommendations on discharge: Patient  will be discharged to [facility] skilled nursing facility, eventually plans on patient returning to home under the care of of her daughter. Conservative therapy with regard to cardiomyopathy. Beta blocker, ACE inhibitor, Bidil also started as new agents. Potassium was replenished and patient be discharged home on potassium supplements also.  Discharge Exam: Blood pressure 115/53, pulse 69, temperature 98.9 F (37.2 C), temperature source Oral, resp. rate 18, height 5\' 5"  (1.651 m), weight 87 kg (191 lb 12.8 oz), SpO2 99.00%.   General appearance: alert, cooperative, appears stated age no acute distress  Eyes: negative findings: lids and lashes normal  Neck: Right facial droop evident. JVP - Normal carotids 2+= without bruits  Resp: Clear  Chest wall: no tenderness  Cardio: S1, S2 normal, No gallop present, no click, no rub and No murmur  GI: soft, non-tender; bowel sounds normal; no masses, no organomegaly  Extremities: edema trace bilateral below-knee pitting edema which is much improved and the skin is now shriveled due to loss of edema. there is skin breakdown of both feet. Dense left hemiplegia (old)  Pulses: Bilateral carotid arteries normal, femoral pulses normal, popliteal pulse and pedal pulse absent.  Skin: Skin breakdown of both feet evident. Has decub ulcer in her right ankle and dorsal foot on the left. Stable    Labs:   Lab Results  Component Value Date   WBC 5.9 07/15/2013   HGB 10.6* 07/15/2013   HCT 35.1* 07/15/2013   MCV 84.6 07/15/2013   PLT 175 07/15/2013    Recent Labs Lab 07/18/13 0330  NA 143  K 4.4  CL 108  CO2 25  BUN 17  CREATININE 0.95  CALCIUM 8.9  GLUCOSE 126*   Lab Results  Component Value Date   CKTOTAL 124 07/25/2011   CKMB 3.1 07/25/2011   TROPONINI 2.79* 07/14/2013    Lipid Panel     Component Value Date/Time   CHOL 171 07/14/2013 0327   TRIG 45 07/14/2013  0327   HDL 49 07/14/2013 0327   CHOLHDL 3.5 07/14/2013 0327   VLDL 9 07/14/2013 0327   LDLCALC 113* 07/14/2013 0327    Radiology: Dg Chest 2 View  07/13/2013   CLINICAL DATA:  Weakness, chest pain  EXAM: CHEST  2 VIEW  COMPARISON:  DG CHEST 2 VIEW dated 07/12/2013  FINDINGS: Sternotomy wires overlie normal cardiac silhouette. Mild improvement in central venous pulmonary congestion pattern. Smaller pleural fluid on the right. No pneumothorax. No focal consolidation.  IMPRESSION: Improvement in central venous pulmonary congestion.  Small right effusion.   Electronically Signed   By: Suzy Bouchard M.D.   On: 07/13/2013 12:03   Dg Chest 2 View  07/12/2013   CLINICAL DATA:  Shortness of breath, cough, chest pain  EXAM: CHEST  2 VIEW  COMPARISON:  Chest x-ray of 06/10/2013  FINDINGS: There is moderate cardiomegaly present with interstitial edema and small effusions consistent with mild congestive heart failure. Median sternotomy sutures are noted from prior CABG. There is soft tissue opacity within the retrosternal airspace just anterior to the ascending aorta of questionable significance. If further assessment is warranted CT angiogram of the chest is recommended. And old lower thoracic compression deformity is noted.  IMPRESSION: 1. Cardiomegaly and interstitial edema with small effusions. 2. Soft tissue opacity in the retrosternal airspace of uncertain significance which is new. Consider CTA of the chest.   Electronically Signed   By: Ivar Drape M.D.   On: 07/12/2013 13:44   FOLLOW UP PLANS AND APPOINTMENTS  Medication List         aspirin 81 MG EC tablet  Take 1 tablet (81 mg total) by mouth daily.     clopidogrel 75 MG tablet  Commonly known as:  PLAVIX  Take 75 mg by mouth daily.     escitalopram 20 MG tablet  Commonly known as:  LEXAPRO  Take 20 mg by mouth daily.     furosemide 20 MG tablet  Commonly known as:  LASIX  Take 20 mg by mouth daily.     hydrochlorothiazide 25 MG  tablet  Commonly known as:  HYDRODIURIL  Take 25 mg by mouth daily.     insulin aspart 100 UNIT/ML injection  Commonly known as:  novoLOG  Inject 11 Units into the skin 3 (three) times daily with meals.     insulin glargine 100 UNIT/ML injection  Commonly known as:  LANTUS  Inject 20 Units into the skin at bedtime.     isosorbide-hydrALAZINE 20-37.5 MG per tablet  Commonly known as:  BIDIL  Take 1 tablet by mouth 3 (three) times daily.     lisinopril 5 MG tablet  Commonly known as:  PRINIVIL,ZESTRIL  Take 1 tablet (5 mg total) by mouth daily.     metoprolol tartrate 25 MG tablet  Commonly known as:  LOPRESSOR  Take 1 tablet (25 mg total) by mouth 2 (two) times daily.     nitroGLYCERIN 0.4 MG SL tablet  Commonly known as:  NITROSTAT  Place 0.4 mg under the tongue every 5 (five) minutes as needed for chest pain.     oxyCODONE 5 MG immediate release tablet  Commonly known as:  Oxy IR/ROXICODONE  Take 5 mg by mouth every 4 (four) hours as needed for severe pain.     potassium chloride SA 20 MEQ tablet  Commonly known as:  K-DUR,KLOR-CON  Take 1 tablet (20 mEq total) by mouth daily.     rosuvastatin 40 MG tablet  Commonly known as:  CRESTOR  Take 40 mg by mouth daily.     silver sulfADIAZINE 1 % cream  Commonly known as:  SILVADENE  Apply 1 application topically 2 (two) times daily. Apply with clean bandages two times daily to right foot     VITAMIN D PO  Take 1 tablet by mouth daily.           Follow-up Information   Follow up with [name], MD On 08/02/2013. (12.00 noon. Bring all medications. Will be seen at 1 pm by the doctor. Nursing need more time to update records)    Specialty:  Cardiology   Contact information:   [place] [place] [address] [phone]       Call MOREIRA,ROY, MD. (To be seen in 10 days to 2 weeks)    Specialty:  Internal Medicine   Contact information:   [address] [place]  [address] [phone]        [name], MD 07/20/2013, 8:50 AM  Pager: [phone] Office: [phone] If no answer: [phone]

## 2013-07-28 ENCOUNTER — Ambulatory Visit: Payer: Medicare HMO

## 2014-01-27 ENCOUNTER — Inpatient Hospital Stay (HOSPITAL_COMMUNITY)
Admission: EM | Admit: 2014-01-27 | Discharge: 2014-01-28 | DRG: 291 | Disposition: A | Discharge status: Home / Self Care | Payer: Medicare HMO | Attending: Internal Medicine | Admitting: Internal Medicine

## 2014-01-27 ENCOUNTER — Other Ambulatory Visit: Payer: Self-pay

## 2014-01-27 ENCOUNTER — Emergency Department (HOSPITAL_COMMUNITY): Payer: Medicare HMO

## 2014-01-27 ENCOUNTER — Encounter (HOSPITAL_COMMUNITY): Payer: Self-pay | Admitting: *Deleted

## 2014-01-27 DIAGNOSIS — Z993 Dependence on wheelchair: Secondary | ICD-10-CM | POA: Diagnosis not present

## 2014-01-27 DIAGNOSIS — K219 Gastro-esophageal reflux disease without esophagitis: Secondary | ICD-10-CM | POA: Diagnosis present

## 2014-01-27 DIAGNOSIS — I5023 Acute on chronic systolic (congestive) heart failure: Secondary | ICD-10-CM | POA: Diagnosis present

## 2014-01-27 DIAGNOSIS — R0789 Other chest pain: Secondary | ICD-10-CM | POA: Diagnosis not present

## 2014-01-27 DIAGNOSIS — R739 Hyperglycemia, unspecified: Secondary | ICD-10-CM

## 2014-01-27 DIAGNOSIS — Z951 Presence of aortocoronary bypass graft: Secondary | ICD-10-CM

## 2014-01-27 DIAGNOSIS — I739 Peripheral vascular disease, unspecified: Secondary | ICD-10-CM | POA: Diagnosis present

## 2014-01-27 DIAGNOSIS — I69320 Aphasia following cerebral infarction: Secondary | ICD-10-CM | POA: Diagnosis not present

## 2014-01-27 DIAGNOSIS — L97519 Non-pressure chronic ulcer of other part of right foot with unspecified severity: Secondary | ICD-10-CM | POA: Diagnosis present

## 2014-01-27 DIAGNOSIS — R1310 Dysphagia, unspecified: Secondary | ICD-10-CM

## 2014-01-27 DIAGNOSIS — R0902 Hypoxemia: Secondary | ICD-10-CM | POA: Insufficient documentation

## 2014-01-27 DIAGNOSIS — J9600 Acute respiratory failure, unspecified whether with hypoxia or hypercapnia: Secondary | ICD-10-CM | POA: Diagnosis present

## 2014-01-27 DIAGNOSIS — E780 Pure hypercholesterolemia: Secondary | ICD-10-CM | POA: Diagnosis present

## 2014-01-27 DIAGNOSIS — Z7901 Long term (current) use of anticoagulants: Secondary | ICD-10-CM | POA: Diagnosis not present

## 2014-01-27 DIAGNOSIS — I639 Cerebral infarction, unspecified: Secondary | ICD-10-CM | POA: Diagnosis present

## 2014-01-27 DIAGNOSIS — I10 Essential (primary) hypertension: Secondary | ICD-10-CM | POA: Diagnosis present

## 2014-01-27 DIAGNOSIS — Z7982 Long term (current) use of aspirin: Secondary | ICD-10-CM | POA: Diagnosis not present

## 2014-01-27 DIAGNOSIS — I252 Old myocardial infarction: Secondary | ICD-10-CM | POA: Diagnosis not present

## 2014-01-27 DIAGNOSIS — E8729 Other acidosis: Secondary | ICD-10-CM | POA: Insufficient documentation

## 2014-01-27 DIAGNOSIS — E1165 Type 2 diabetes mellitus with hyperglycemia: Secondary | ICD-10-CM

## 2014-01-27 DIAGNOSIS — E872 Acidosis: Secondary | ICD-10-CM | POA: Diagnosis present

## 2014-01-27 DIAGNOSIS — R0600 Dyspnea, unspecified: Secondary | ICD-10-CM

## 2014-01-27 DIAGNOSIS — I2510 Atherosclerotic heart disease of native coronary artery without angina pectoris: Secondary | ICD-10-CM | POA: Diagnosis present

## 2014-01-27 DIAGNOSIS — E119 Type 2 diabetes mellitus without complications: Secondary | ICD-10-CM | POA: Diagnosis present

## 2014-01-27 DIAGNOSIS — R0603 Acute respiratory distress: Secondary | ICD-10-CM

## 2014-01-27 DIAGNOSIS — E785 Hyperlipidemia, unspecified: Secondary | ICD-10-CM | POA: Diagnosis present

## 2014-01-27 DIAGNOSIS — Z880 Allergy status to penicillin: Secondary | ICD-10-CM

## 2014-01-27 DIAGNOSIS — I69359 Hemiplegia and hemiparesis following cerebral infarction affecting unspecified side: Secondary | ICD-10-CM

## 2014-01-27 DIAGNOSIS — I509 Heart failure, unspecified: Secondary | ICD-10-CM

## 2014-01-27 DIAGNOSIS — I69328 Other speech and language deficits following cerebral infarction: Secondary | ICD-10-CM

## 2014-01-27 DIAGNOSIS — I69351 Hemiplegia and hemiparesis following cerebral infarction affecting right dominant side: Secondary | ICD-10-CM

## 2014-01-27 HISTORY — DX: Heart failure, unspecified: I50.9

## 2014-01-27 LAB — BASIC METABOLIC PANEL
Anion gap: 18 — ABNORMAL HIGH (ref 5–15)
Anion gap: 13 (ref 5–15)
BUN: 15 mg/dL (ref 6–23)
BUN: 13 mg/dL (ref 6–23)
CALCIUM: 9.2 mg/dL (ref 8.4–10.5)
CHLORIDE: 104 meq/L (ref 96–112)
CO2: 19 meq/L (ref 19–32)
CO2: 27 mEq/L (ref 19–32)
CREATININE: 0.81 mg/dL (ref 0.50–1.10)
CREATININE: 0.83 mg/dL (ref 0.50–1.10)
Calcium: 9.4 mg/dL (ref 8.4–10.5)
Chloride: 103 mEq/L (ref 96–112)
GFR calc Af Amer: 82 mL/min — ABNORMAL LOW (ref >90)
GFR calc Af Amer: 80 mL/min — ABNORMAL LOW (ref >90)
GFR calc non Af Amer: 71 mL/min — ABNORMAL LOW (ref >90)
GFR calc non Af Amer: 69 mL/min — ABNORMAL LOW (ref >90)
GLUCOSE: 233 mg/dL — AB (ref 70–99)
Glucose, Bld: 130 mg/dL — ABNORMAL HIGH (ref 70–99)
Potassium: 4 mEq/L (ref 3.7–5.3)
Potassium: 3.5 mEq/L — ABNORMAL LOW (ref 3.7–5.3)
Sodium: 141 mEq/L (ref 137–147)
Sodium: 143 mEq/L (ref 137–147)

## 2014-01-27 LAB — GLUCOSE, CAPILLARY: Glucose-Capillary: 133 mg/dL — ABNORMAL HIGH (ref 70–99)

## 2014-01-27 LAB — PRO B NATRIURETIC PEPTIDE: Pro B Natriuretic peptide (BNP): 2572 pg/mL — ABNORMAL HIGH (ref 0–125)

## 2014-01-27 LAB — CBC
HEMATOCRIT: 38.8 % (ref 36.0–46.0)
HEMOGLOBIN: 11.8 g/dL — AB (ref 12.0–15.0)
MCH: 25.4 pg — AB (ref 26.0–34.0)
MCHC: 30.4 g/dL (ref 30.0–36.0)
MCV: 83.6 fL (ref 78.0–100.0)
Platelets: 135 10*3/uL — ABNORMAL LOW (ref 150–400)
RBC: 4.64 MIL/uL (ref 3.87–5.11)
RDW: 16 % — ABNORMAL HIGH (ref 11.5–15.5)
WBC: 6.4 10*3/uL (ref 4.0–10.5)

## 2014-01-27 MED ORDER — SODIUM CHLORIDE 0.9 % IV SOLN: 250.0000 mL | INTRAVENOUS | Status: DC | PRN

## 2014-01-27 MED ORDER — FUROSEMIDE 10 MG/ML IJ SOLN
40.0000 mg | Freq: Every day | INTRAMUSCULAR | Status: DC
  Administered 2014-01-28: 40 mg via INTRAVENOUS
  Filled 2014-01-27: qty 4

## 2014-01-27 MED ORDER — CLOPIDOGREL BISULFATE 75 MG PO TABS
75.0000 mg | ORAL_TABLET | Freq: Every day | ORAL | Status: DC
  Administered 2014-01-28: 75 mg via ORAL
  Filled 2014-01-27: qty 1

## 2014-01-27 MED ORDER — SILVER SULFADIAZINE 1 % EX CREA
1.0000 "application " | TOPICAL_CREAM | Freq: Two times a day (BID) | CUTANEOUS | Status: DC
  Administered 2014-01-27 – 2014-01-28 (×2): 1 via TOPICAL
  Filled 2014-01-27: qty 85

## 2014-01-27 MED ORDER — ALBUTEROL SULFATE (2.5 MG/3ML) 0.083% IN NEBU
5.0000 mg | INHALATION_SOLUTION | Freq: Once | RESPIRATORY_TRACT | Status: AC
  Administered 2014-01-27: 5 mg via RESPIRATORY_TRACT

## 2014-01-27 MED ORDER — INSULIN ASPART 100 UNIT/ML ~~LOC~~ SOLN
0.0000 [IU] | Freq: Three times a day (TID) | SUBCUTANEOUS | Status: DC
  Administered 2014-01-28: 5 [IU] via SUBCUTANEOUS

## 2014-01-27 MED ORDER — FUROSEMIDE 10 MG/ML IJ SOLN
40.0000 mg | Freq: Once | INTRAMUSCULAR | Status: AC
  Administered 2014-01-27: 40 mg via INTRAVENOUS

## 2014-01-27 MED ORDER — OXYCODONE HCL 5 MG PO TABS
5.0000 mg | ORAL_TABLET | ORAL | Status: DC | PRN
  Administered 2014-01-28: 5 mg via ORAL

## 2014-01-27 MED ORDER — INSULIN ASPART 100 UNIT/ML ~~LOC~~ SOLN: 0.0000 [IU] | Freq: Every day | SUBCUTANEOUS | Status: DC

## 2014-01-27 MED ORDER — ONDANSETRON HCL 4 MG/2ML IJ SOLN: 4.0000 mg | Freq: Four times a day (QID) | INTRAMUSCULAR | Status: DC | PRN

## 2014-01-27 MED ORDER — METOPROLOL TARTRATE 25 MG PO TABS
25.0000 mg | ORAL_TABLET | Freq: Two times a day (BID) | ORAL | Status: DC
  Administered 2014-01-27 – 2014-01-28 (×2): 25 mg via ORAL
  Filled 2014-01-27 (×3): qty 1

## 2014-01-27 MED ORDER — SODIUM CHLORIDE 0.9 % IJ SOLN
3.0000 mL | Freq: Two times a day (BID) | INTRAMUSCULAR | Status: DC
  Administered 2014-01-27 – 2014-01-28 (×2): 3 mL via INTRAVENOUS

## 2014-01-27 MED ORDER — ASPIRIN EC 81 MG PO TBEC
81.0000 mg | DELAYED_RELEASE_TABLET | Freq: Every day | ORAL | Status: DC
  Administered 2014-01-28: 81 mg via ORAL
  Filled 2014-01-27: qty 1

## 2014-01-27 MED ORDER — ACETAMINOPHEN 325 MG PO TABS: 650.0000 mg | ORAL_TABLET | ORAL | Status: DC | PRN

## 2014-01-27 MED ORDER — LISINOPRIL 5 MG PO TABS
5.0000 mg | ORAL_TABLET | Freq: Every day | ORAL | Status: DC
  Administered 2014-01-28: 5 mg via ORAL
  Filled 2014-01-27: qty 1

## 2014-01-27 MED ORDER — ENOXAPARIN SODIUM 40 MG/0.4ML ~~LOC~~ SOLN
40.0000 mg | SUBCUTANEOUS | Status: DC
  Administered 2014-01-28: 40 mg via SUBCUTANEOUS
  Filled 2014-01-27: qty 0.4

## 2014-01-27 MED ORDER — ROSUVASTATIN CALCIUM 40 MG PO TABS
40.0000 mg | ORAL_TABLET | Freq: Every day | ORAL | Status: DC
  Administered 2014-01-28: 40 mg via ORAL
  Filled 2014-01-27: qty 1

## 2014-01-27 MED ORDER — ALBUTEROL SULFATE (2.5 MG/3ML) 0.083% IN NEBU: 2.5000 mg | INHALATION_SOLUTION | RESPIRATORY_TRACT | Status: AC | PRN

## 2014-01-27 MED ORDER — INSULIN GLARGINE 100 UNIT/ML ~~LOC~~ SOLN
20.0000 [IU] | Freq: Every day | SUBCUTANEOUS | Status: DC
  Administered 2014-01-27: 20 [IU] via SUBCUTANEOUS
  Filled 2014-01-27 (×2): qty 0.2

## 2014-01-27 MED ORDER — ISOSORB DINITRATE-HYDRALAZINE 20-37.5 MG PO TABS
1.0000 | ORAL_TABLET | Freq: Three times a day (TID) | ORAL | Status: DC
  Administered 2014-01-27 – 2014-01-28 (×2): 1 via ORAL
  Filled 2014-01-27 (×4): qty 1

## 2014-01-27 MED ORDER — SODIUM CHLORIDE 0.9 % IJ SOLN: 3.0000 mL | INTRAMUSCULAR | Status: DC | PRN

## 2014-01-27 MED ORDER — ESCITALOPRAM OXALATE 20 MG PO TABS
20.0000 mg | ORAL_TABLET | Freq: Every day | ORAL | Status: DC
  Administered 2014-01-28: 20 mg via ORAL
  Filled 2014-01-27: qty 1

## 2014-01-27 NOTE — ED Notes (Signed)
Encouraged pt. To use the call bell as soon as she feels the urgency to urinate. Pt. Demonstrated understanding by nodding her head yes.

## 2014-01-27 NOTE — Progress Notes (Signed)
**Note De-Identified [name] [name] Obfuscation** Patient demonstrated poor effort while performing peak flow; FEV1 and PEF not obtained.  BBS clear.

## 2014-01-27 NOTE — ED Provider Notes (Signed)
CSN: [id number]     Arrival date & time 01/27/14  1426 History   First MD Initiated Contact with Patient 01/27/14 1510     Chief Complaint  Patient presents with  . Respiratory Distress     HPI  Patient presents with dyspnea. Patient was in her usual state of health until this morning.  When she awoke she felt chest pressure, difficulty breathing. Since onset symptoms have been persistent. No clear alleviating or exacerbating factors, until the patient received albuterol therapy in route via EMS providers. That improved her condition. Patient denies nausea, vomiting, fever, confusion, disorientation. Patient has multiple medical issues, including prior stroke, with right-sided hemiplegia, aphasia. Patient writes, gives brief verbal response in response to questions, and is awake and alert, seemingly answering all questions appropriately.   Past Medical History  Diagnosis Date  . 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 Communicates by writing   . Hypertension   . Hypercholesteremia   . Complication of anesthesia     lung   . Angina   . GERD (gastroesophageal reflux disease)   . Myocardial infarction     CABG 2001, ? stent. Cath 02/2011 with new distal LIMA-LAD 80%. SVG-OM occlusion is old, rest of grafts patent  . Peripheral vascular disease   . Diabetes mellitus   . H/O hiatal hernia   . CHF (congestive heart failure)    Past Surgical History  Procedure Laterality Date  . Coronary artery bypass graft    . Abdominal hysterectomy    . Breast biopsy    . Esophagogastroduodenoscopy  03/12/2011    Procedure: ESOPHAGOGASTRODUODENOSCOPY (EGD);  Surgeon: [name], MD;  Location: [facility] ENDOSCOPY;  Service: Endoscopy;  Laterality: N/A;  . Cardiac catheterization  02/2011   Family History  Problem Relation Age of Onset  . Anesthesia problems Neg Hx   . Hypotension Neg Hx   . Malignant hyperthermia Neg Hx   . Pseudochol deficiency Neg Hx   . Heart  disease Mother   . Stroke Father   . Diabetes Sister   . Diabetes Brother    History  Substance Use Topics  . Smoking status: Never Smoker   . Smokeless tobacco: Not on file  . Alcohol Use: No   OB History    No data available     Review of Systems  Constitutional:       Per HPI, otherwise negative  HENT:       Per HPI, otherwise negative  Respiratory:       Per HPI, otherwise negative  Cardiovascular:       Per HPI, otherwise negative  Gastrointestinal: Negative for vomiting.  Endocrine:       Negative aside from HPI  Genitourinary:       Neg aside from HPI   Musculoskeletal:       Per HPI, otherwise negative  Skin: Negative.   Neurological: Negative for syncope.      Allergies  Penicillins  Home Medications   Prior to Admission medications   Medication Sig Start Date End Date Taking? Authorizing Provider  aspirin EC 81 MG EC tablet Take 1 tablet (81 mg total) by mouth daily. 07/20/13   [name], MD  Cholecalciferol (VITAMIN D PO) Take 1 tablet by mouth daily.    Historical Provider, MD  clopidogrel (PLAVIX) 75 MG tablet Take 75 mg by mouth daily.      Historical Provider, MD  escitalopram (LEXAPRO) 20 MG tablet Take  20 mg by mouth daily.    Historical Provider, MD  furosemide (LASIX) 20 MG tablet Take 20 mg by mouth daily.    Historical Provider, MD  hydrochlorothiazide (HYDRODIURIL) 25 MG tablet Take 25 mg by mouth daily.      Historical Provider, MD  insulin aspart (NOVOLOG) 100 UNIT/ML injection Inject 11 Units into the skin 3 (three) times daily with meals.    Historical Provider, MD  insulin glargine (LANTUS) 100 UNIT/ML injection Inject 20 Units into the skin at bedtime.    Historical Provider, MD  isosorbide-hydrALAZINE (BIDIL) 20-37.5 MG per tablet Take 1 tablet by mouth 3 (three) times daily. 07/20/13   [name], MD  lisinopril (PRINIVIL,ZESTRIL) 5 MG tablet Take 1 tablet (5 mg total) by mouth daily. 07/20/13   [name], MD   metoprolol tartrate (LOPRESSOR) 25 MG tablet Take 1 tablet (25 mg total) by mouth 2 (two) times daily. 07/20/13   [name], MD  nitroGLYCERIN (NITROSTAT) 0.4 MG SL tablet Place 0.4 mg under the tongue every 5 (five) minutes as needed for chest pain.    Historical Provider, MD  oxyCODONE (OXY IR/ROXICODONE) 5 MG immediate release tablet Take 5 mg by mouth every 4 (four) hours as needed for severe pain.    Historical Provider, MD  potassium chloride SA (K-DUR,KLOR-CON) 20 MEQ tablet Take 1 tablet (20 mEq total) by mouth daily. 07/20/13   [name], MD  rosuvastatin (CRESTOR) 40 MG tablet Take 40 mg by mouth daily.    Historical Provider, MD  silver sulfADIAZINE (SILVADENE) 1 % cream Apply 1 application topically 2 (two) times daily. Apply with clean bandages two times daily to right foot 01/29/13   Robynn Pane, MD   BP 153/87 mmHg  Pulse 76  Temp(Src) 98.3 F (36.8 C) (Oral)  Resp 20  SpO2 97% Physical Exam  Constitutional: She is oriented to person, place, and time. She appears well-developed and well-nourished. No distress.  HENT:  Head: Normocephalic and atraumatic.  Eyes: Conjunctivae and EOM are normal.  Cardiovascular: Normal rate and regular rhythm.   Pulmonary/Chest: Effort normal and breath sounds normal. No stridor. No respiratory distress.  Abdominal: She exhibits no distension.  Musculoskeletal: She exhibits no edema.  Neurological: She is alert and oriented to person, place, and time. No cranial nerve deficit.  Right-sided hemi-plegia.  Patient writes to continue with the left hand, is answering questions appropriately, either via written response or with brief verbal response.  Patient moves the left side spontaneously, freely.   Skin: Skin is warm and dry.  Psychiatric: Her mood appears anxious.  Nursing note and vitals reviewed.   ED Course  Procedures (including critical care time)   On initial exam the patient has 90% on room air, this improves with  supplemental oxygen Labs Review Labs Reviewed  CBC - Abnormal; Notable for the following:    Hemoglobin 11.8 (*)    MCH 25.4 (*)    RDW 16.0 (*)    Platelets 135 (*)    All other components within normal limits  BASIC METABOLIC PANEL - Abnormal; Notable for the following:    Glucose, Bld 233 (*)    GFR calc non Af Amer 71 (*)    GFR calc Af Amer 82 (*)    Anion gap 18 (*)    All other components within normal limits  PRO B NATRIURETIC PEPTIDE - Abnormal; Notable for the following:    Pro B Natriuretic peptide (BNP) 2572.0 (*)  All other components within normal limits  I-STAT TROPOININ, ED  Patient's BNP is elevated   Imaging Review X-ray without pneumonia, but with vascular congestion EKG has sinus rhythm, rate 75 T-wave abnormalities, repolarization changes, hypertrophic changes, abnormal   On repeat exam the patient has received albuterol therapy, with some improvement.  She has also received Lasix, and is urinating.    MDM  Patient presents with dyspnea.  Patient has multiple medical issues, including congestive heart failure, prior stroke, diabetes. Today, the patient's labs notable for evidence of anion gap, hyperglycemia, Getting efforts to diurese the patient for her heart failure exacerbation. Patient did improve here with nebulizer therapy, Lasix, but with her multiple medical issues, she was admitted for further evaluation and management.    Carmin Muskrat, MD 01/27/14 [phone]

## 2014-01-27 NOTE — ED Notes (Signed)
According to home health RN, "pt. C/o chest pressure." EMS: rt lung: diminished, inc. Dependent edema; pt. Does drool b/c hx. Of stroke. EMS gave Albuterol 5 mg and effective - bilateral lung sds. Coarse/rhonchi. 12 lead EKG unremarkable. Home health RN gave 11 units of Lantus for CBG > 355.

## 2014-01-27 NOTE — ED Notes (Signed)
Pt. Has a hx. Of a stroke: see neuro assessment - no new symptoms.

## 2014-01-27 NOTE — ED Notes (Signed)
Did a wet/dry dressing to rt. Foot, ant. Decub 2 pressure ulcer.

## 2014-01-27 NOTE — ED Notes (Signed)
Myself and Ed, RN cleaned pt and change stretcher linens; placed chuks underneath pt and a clean dry diaper on pt; resituated pt on stretcher; daughter at bedside

## 2014-01-27 NOTE — H&P (Addendum)
PCP:  Jilda Panda, MD  Cardiology Tawni Levy  Chief Complaint:  Wound care nurse found her short of breath.   HPI: [name] [name] is a 73 y.o. female   has a past medical history of Stroke (2002 and 2003); Hypertension; Hypercholesteremia; Complication of anesthesia; Angina; GERD (gastroesophageal reflux disease); Myocardial infarction; Peripheral vascular disease; Diabetes mellitus; H/O hiatal hernia; and CHF (congestive heart failure).   Presented with  Patient is aphasic and hemiplegic on the right  from CVA 12 years ago. Patient lives with family at home. She has wound care nurse come out 3 days a week for hx of  Right foot ulcer.  Patient was noted to have increased leg swelling and crackles on lung exam earlier today. She was reporting chest pressure at that time.  EMS was called and She was taken to [facility]. On arrival patient was noted to be hypoxic down to 70's. Denies any fever or chills. CXR showed mild pulmonary edema. Patient endorses some cramping like pain in right chest says she gets this a lot when she takes lasix. Of note her BG was noted to be elevated up to 233 because she did not take insulin today. Initial blood work showed elevated anion gap at 18. Repeat lab pending.  In ER shew as given Lasix 40 IV and albuterol treatment with good result. At this time patient satting 99% on room air with blood pressures 120s over 80s appears comfortable.  Of note patient at baseline is wheelchair bound. She tolerates regular diet does not take any food thickeners. She has met Driscoll aspiration in the past but family states that she chooses to eat and does well with her current diet.  Hospitalist was called for admission for CHF exacerbation  Review of Systems:    Pertinent positives include:  shortness of breath at rest. non-productive cough,  Constitutional:  No weight loss, night sweats, Fevers, chills, fatigue, weight loss  Bilateral lower extremity swelling chest pain and  pressure HEENT:  No headaches, Difficulty swallowing,Tooth/dental problems,Sore throat,  No sneezing, itching, ear ache, nasal congestion, post nasal drip,  Cardio-vascular:  No , Orthopnea, PND, anasarca, dizziness, palpitations.no GI:  No heartburn, indigestion, abdominal pain, nausea, vomiting, diarrhea, change in bowel habits, loss of appetite, melena, blood in stool, hematemesis Resp:  no No dyspnea on exertion, No excess mucus, no productive cough, No  No coughing up of blood.No change in color of mucus.No wheezing. Skin:  no rash or lesions. No jaundice GU:  no dysuria, change in color of urine, no urgency or frequency. No straining to urinate.  No flank pain.  Musculoskeletal:  No joint pain or no joint swelling. No decreased range of motion. No back pain.  Psych:  No change in mood or affect. No depression or anxiety. No memory loss.  Neuro: no localizing neurological complaints, no tingling, no weakness, no double vision, no gait abnormality, no slurred speech, no confusion  Otherwise ROS are negative except for above, 10 systems were reviewed  Past Medical History: Past Medical History  Diagnosis Date  . 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 Communicates by writing   . Hypertension   . Hypercholesteremia   . Complication of anesthesia     lung   . Angina   . GERD (gastroesophageal reflux disease)   . Myocardial infarction     CABG 2001, ? stent. Cath 02/2011 with new distal LIMA-LAD 80%. SVG-OM occlusion is old, rest of grafts patent  .  Peripheral vascular disease   . Diabetes mellitus   . H/O hiatal hernia   . CHF (congestive heart failure)    Past Surgical History  Procedure Laterality Date  . Coronary artery bypass graft    . Abdominal hysterectomy    . Breast biopsy    . Esophagogastroduodenoscopy  03/12/2011    Procedure: ESOPHAGOGASTRODUODENOSCOPY (EGD);  Surgeon: [name], MD;  Location: [facility] ENDOSCOPY;  Service: Endoscopy;   Laterality: N/A;  . Cardiac catheterization  02/2011     Medications: Prior to Admission medications   Medication Sig Start Date End Date Taking? Authorizing Provider  aspirin EC 81 MG EC tablet Take 1 tablet (81 mg total) by mouth daily. 07/20/13   [name], MD  Cholecalciferol (VITAMIN D PO) Take 1 tablet by mouth daily.    Historical Provider, MD  clopidogrel (PLAVIX) 75 MG tablet Take 75 mg by mouth daily.      Historical Provider, MD  escitalopram (LEXAPRO) 20 MG tablet Take 20 mg by mouth daily.    Historical Provider, MD  furosemide (LASIX) 20 MG tablet Take 20 mg by mouth daily.    Historical Provider, MD  hydrochlorothiazide (HYDRODIURIL) 25 MG tablet Take 25 mg by mouth daily.      Historical Provider, MD  insulin aspart (NOVOLOG) 100 UNIT/ML injection Inject 11 Units into the skin 3 (three) times daily with meals.    Historical Provider, MD  insulin glargine (LANTUS) 100 UNIT/ML injection Inject 20 Units into the skin at bedtime.    Historical Provider, MD  isosorbide-hydrALAZINE (BIDIL) 20-37.5 MG per tablet Take 1 tablet by mouth 3 (three) times daily. 07/20/13   [name], MD  lisinopril (PRINIVIL,ZESTRIL) 5 MG tablet Take 1 tablet (5 mg total) by mouth daily. 07/20/13   [name], MD  metoprolol tartrate (LOPRESSOR) 25 MG tablet Take 1 tablet (25 mg total) by mouth 2 (two) times daily. 07/20/13   [name], MD  nitroGLYCERIN (NITROSTAT) 0.4 MG SL tablet Place 0.4 mg under the tongue every 5 (five) minutes as needed for chest pain.    Historical Provider, MD  oxyCODONE (OXY IR/ROXICODONE) 5 MG immediate release tablet Take 5 mg by mouth every 4 (four) hours as needed for severe pain.    Historical Provider, MD  potassium chloride SA (K-DUR,KLOR-CON) 20 MEQ tablet Take 1 tablet (20 mEq total) by mouth daily. 07/20/13   [name], MD  rosuvastatin (CRESTOR) 40 MG tablet Take 40 mg by mouth daily.    Historical Provider, MD  silver sulfADIAZINE  (SILVADENE) 1 % cream Apply 1 application topically 2 (two) times daily. Apply with clean bandages two times daily to right foot 01/29/13   Robynn Pane, MD    Allergies:   Allergies  Allergen Reactions  . Penicillins Swelling    Swelling of mouth    Social History:  Ambulatory wheelchair bound  Lives at home  With family     reports that she has never smoked. She does not have any smokeless tobacco history on file. She reports that she does not drink alcohol or use illicit drugs.    Family History: family history includes Diabetes in her brother and sister; Heart disease in her mother; Stroke in her father. There is no history of Anesthesia problems, Hypotension, Malignant hyperthermia, or Pseudochol deficiency.    Physical Exam: Patient Vitals for the past 24 hrs:  BP Temp Temp src Pulse Resp SpO2  01/27/14 1730 142/66 mmHg - -  95 26 100 %  01/27/14 1700 133/70 mmHg - - 65 22 97 %  01/27/14 1645 140/80 mmHg - - 69 18 97 %  01/27/14 1630 143/74 mmHg - - 72 22 97 %  01/27/14 1615 145/67 mmHg - - 68 26 97 %  01/27/14 1600 146/68 mmHg - - 72 22 98 %  01/27/14 1545 147/80 mmHg - - 73 26 95 %  01/27/14 1530 150/76 mmHg - - 108 22 97 %  01/27/14 1517 160/77 mmHg - - 74 26 96 %  01/27/14 1501 153/87 mmHg - - 76 20 97 %  01/27/14 1450 - 98.3 F (36.8 C) Oral - - -  01/27/14 1445 165/74 mmHg - - 75 (!) 28 100 %    1. General:  in No Acute distress 2. Psychological: Alert and   Oriented, speech difficult to understand at baseline 3. Head/ENT:   Moist  Mucous Membranes                          Head Non traumatic, neck supple                           Poor Dentition 4. SKIN: normal  Skin turgor,  Skin clean Dry and intact no rash, small healing ulcer on the dorsum of right foot 5. Heart: Regular rate and rhythm no Murmur, Rub or gallop 6. Lungs:no wheezes or crackles   7. Abdomen: Soft, non-tender, Non distended 8. Lower extremities: no clubbing, cyanosis, bilateral edema right  worse than left 9. Neurologically Grossly intact, moving all 4 extremities equally 10. MSK: Normal range of motion  body mass index is unknown because there is no weight on file.   Labs on Admission:   Results for orders placed or performed during the hospital encounter of 01/27/14 (from the past 24 hour(s))  CBC     Status: Abnormal   Collection Time: 01/27/14  4:02 PM  Result Value Ref Range   WBC 6.4 4.0 - 10.5 K/uL   RBC 4.64 3.87 - 5.11 MIL/uL   Hemoglobin 11.8 (L) 12.0 - 15.0 g/dL   HCT 38.8 36.0 - 46.0 %   MCV 83.6 78.0 - 100.0 fL   MCH 25.4 (L) 26.0 - 34.0 pg   MCHC 30.4 30.0 - 36.0 g/dL   RDW 16.0 (H) 11.5 - 15.5 %   Platelets 135 (L) 150 - 400 K/uL  Basic metabolic panel     Status: Abnormal   Collection Time: 01/27/14  4:02 PM  Result Value Ref Range   Sodium 141 137 - 147 mEq/L   Potassium 4.0 3.7 - 5.3 mEq/L   Chloride 104 96 - 112 mEq/L   CO2 19 19 - 32 mEq/L   Glucose, Bld 233 (H) 70 - 99 mg/dL   BUN 15 6 - 23 mg/dL   Creatinine, Ser 0.81 0.50 - 1.10 mg/dL   Calcium 9.2 8.4 - 10.5 mg/dL   GFR calc non Af Amer 71 (L) >90 mL/min   GFR calc Af Amer 82 (L) >90 mL/min   Anion gap 18 (H) 5 - 15  BNP (order ONLY if patient complains of dyspnea/SOB AND you have documented it for THIS visit)     Status: Abnormal   Collection Time: 01/27/14  4:02 PM  Result Value Ref Range   Pro B Natriuretic peptide (BNP) 2572.0 (H) 0 - 125 pg/mL    UA currently pending  Lab Results  Component Value Date   HGBA1C 7.0* 07/13/2013    CrCl cannot be calculated (Unknown ideal weight.).  BNP (last 3 results)  Recent Labs  07/15/13 0121 07/16/13 0345 01/27/14 1602  PROBNP 3819.0* 3503.0* 2572.0*   Echo in may 2015 showing EF of 25% followed by Dr. Einar Gip  Other results:  I have pearsonaly reviewed this: ECG REPORT  Rate: 75  Rhythm: Left atrial enlargement and LVH  ST&T Change: T-wave inversions in leads i, II V5 and V5 and Mucinex unchanged from EKG in May  2015   There were no vitals filed for this visit.   Cultures:    Component Value Date/Time   SDES URINE, CATHETERIZED 07/13/2013 2256   SPECREQUEST NONE 07/13/2013 2256   CULT NO GROWTH Performed at [facility] 07/13/2013 2256   REPTSTATUS 07/15/2013 FINAL 07/13/2013 2256     Radiological Exams on Admission: Dg Chest Port 1 View  01/27/2014   CLINICAL DATA:  Initial evaluation for shortness of breath for 1 day, personal history of myocardial infarction May 2015  EXAM: PORTABLE CHEST - 1 VIEW  COMPARISON:  07/13/2013  FINDINGS: Moderate cardiac enlargement status post CABG. Mild diffuse interstitial change. No definite Kerley B-lines. No consolidation or effusion.  IMPRESSION: Cardiac enlargement with minimal vascular congestion and interstitial prominence. Minimal pulmonary edema not excluded.   Electronically Signed   By: Skipper Cliche M.D.   On: 01/27/2014 15:38    Chart has been reviewed  Assessment/Plan  73 year old female with history of hemiplegia and aphagia result of stroke 12 years ago, systolic heart failure with EF of 25% per echo gram in May 2015 history of diabetes presents with worsening shortness of breath and hypoxia with leg swelling right worse than left improved with IV Lasix being admitted for systolic CHF exacerbation    Present on Admission:  . Respiratory distress -likely result of CHF exacerbation improved after the administration of IV Lasix. Given the unilateral leg swelling we'll obtain Dopplers, cycle cardiac enzymes provide oxygen as needed  . CAD (coronary artery disease), native coronary artery - continue aspirin and Plavix beta blocker and statin currently stable cycle cardiac enzymes  . CVA (cerebral infarction) - chronic aphasia and right hemiparesis unchanged from baseline  . HTN (hypertension) - continue home medications  . Hypoxia - urticaria resolved was likely secondary to CHF exacerbation  . Acute on chronic systolic CHF (congestive  heart failure) - admit on IV Lasix 40 IV once a day since she responded well to that dosage. Monitor Creatinine, Foley, cycle cardiac enzymes,  repeat echo gram given acute change  . Increased anion gap metabolic acidosis - mild, will check urine for ketones repeat bmet, patient did not take her insulin today currently has somewhat elevated blood sugar. We will evaluate for evidence of mild DKA, patient is  type II diabetic with no prior history DKA making it somewhat less likely. Check lactic acid Diabetes mellitus type 2 - will restart her insulin, sliding scale  Right leg wound -looks without evidence of infection will order Silvadene cream and wound care Prophylaxis:  Lovenox, Protonix  CODE STATUS:  FULL CODE    Other plan as per orders.  I have spent a total of 55 min on this admission  [name] [name] 01/27/2014, 7:27 PM  [facility]  Pager [phone]   after 2 AM please page floor coverage PA If 7AM-7PM, please contact the day team taking care of the patient  [email or web address]  Password [email or web address]

## 2014-01-27 NOTE — ED Notes (Signed)
Report attempt x 1 made

## 2014-01-28 ENCOUNTER — Other Ambulatory Visit: Payer: Self-pay

## 2014-01-28 DIAGNOSIS — R609 Edema, unspecified: Secondary | ICD-10-CM

## 2014-01-28 DIAGNOSIS — I10 Essential (primary) hypertension: Secondary | ICD-10-CM

## 2014-01-28 DIAGNOSIS — J9600 Acute respiratory failure, unspecified whether with hypoxia or hypercapnia: Secondary | ICD-10-CM | POA: Diagnosis present

## 2014-01-28 DIAGNOSIS — I69359 Hemiplegia and hemiparesis following cerebral infarction affecting unspecified side: Secondary | ICD-10-CM

## 2014-01-28 DIAGNOSIS — I509 Heart failure, unspecified: Secondary | ICD-10-CM

## 2014-01-28 DIAGNOSIS — I5023 Acute on chronic systolic (congestive) heart failure: Principal | ICD-10-CM

## 2014-01-28 DIAGNOSIS — I69328 Other speech and language deficits following cerebral infarction: Secondary | ICD-10-CM

## 2014-01-28 DIAGNOSIS — I639 Cerebral infarction, unspecified: Secondary | ICD-10-CM

## 2014-01-28 DIAGNOSIS — R1310 Dysphagia, unspecified: Secondary | ICD-10-CM

## 2014-01-28 DIAGNOSIS — I059 Rheumatic mitral valve disease, unspecified: Secondary | ICD-10-CM

## 2014-01-28 DIAGNOSIS — L97519 Non-pressure chronic ulcer of other part of right foot with unspecified severity: Secondary | ICD-10-CM

## 2014-01-28 LAB — CBC WITH DIFFERENTIAL/PLATELET
BASOS ABS: 0 10*3/uL (ref 0.0–0.1)
BASOS PCT: 0 % (ref 0–1)
EOS PCT: 2 % (ref 0–5)
Eosinophils Absolute: 0.1 10*3/uL (ref 0.0–0.7)
HEMATOCRIT: 35.3 % — AB (ref 36.0–46.0)
HEMOGLOBIN: 11 g/dL — AB (ref 12.0–15.0)
LYMPHS PCT: 21 % (ref 12–46)
Lymphs Abs: 1.3 10*3/uL (ref 0.7–4.0)
MCH: 25.8 pg — ABNORMAL LOW (ref 26.0–34.0)
MCHC: 31.2 g/dL (ref 30.0–36.0)
MCV: 82.7 fL (ref 78.0–100.0)
MONOS PCT: 9 % (ref 3–12)
Monocytes Absolute: 0.6 10*3/uL (ref 0.1–1.0)
NEUTROS ABS: 4.3 10*3/uL (ref 1.7–7.7)
Neutrophils Relative %: 68 % (ref 43–77)
Platelets: 159 10*3/uL (ref 150–400)
RBC: 4.27 MIL/uL (ref 3.87–5.11)
RDW: 16 % — ABNORMAL HIGH (ref 11.5–15.5)
WBC: 6.3 10*3/uL (ref 4.0–10.5)

## 2014-01-28 LAB — COMPREHENSIVE METABOLIC PANEL
ALT: 25 U/L (ref 0–35)
AST: 18 U/L (ref 0–37)
Albumin: 3.1 g/dL — ABNORMAL LOW (ref 3.5–5.2)
Alkaline Phosphatase: 58 U/L (ref 39–117)
Anion gap: 13 (ref 5–15)
BUN: 13 mg/dL (ref 6–23)
CALCIUM: 9 mg/dL (ref 8.4–10.5)
CO2: 25 meq/L (ref 19–32)
CREATININE: 0.83 mg/dL (ref 0.50–1.10)
Chloride: 105 mEq/L (ref 96–112)
GFR calc Af Amer: 80 mL/min — ABNORMAL LOW (ref >90)
GFR calc non Af Amer: 69 mL/min — ABNORMAL LOW (ref >90)
Glucose, Bld: 326 mg/dL — ABNORMAL HIGH (ref 70–99)
Potassium: 3.9 mEq/L (ref 3.7–5.3)
Sodium: 143 mEq/L (ref 137–147)
Total Bilirubin: 0.4 mg/dL (ref 0.3–1.2)
Total Protein: 6.5 g/dL (ref 6.0–8.3)

## 2014-01-28 LAB — TSH: TSH: 1.84 u[IU]/mL (ref 0.350–4.500)

## 2014-01-28 LAB — HEMOGLOBIN A1C
Hgb A1c MFr Bld: 7.8 % — ABNORMAL HIGH (ref <5.7)
Mean Plasma Glucose: 177 mg/dL — ABNORMAL HIGH (ref <117)

## 2014-01-28 LAB — GLUCOSE, CAPILLARY
Glucose-Capillary: 269 mg/dL — ABNORMAL HIGH (ref 70–99)
Glucose-Capillary: 230 mg/dL — ABNORMAL HIGH (ref 70–99)

## 2014-01-28 LAB — PRO B NATRIURETIC PEPTIDE: PRO B NATRI PEPTIDE: 3218 pg/mL — AB (ref 0–125)

## 2014-01-28 LAB — MAGNESIUM: MAGNESIUM: 2 mg/dL (ref 1.5–2.5)

## 2014-01-28 LAB — TROPONIN I
Troponin I: <0.3 ng/mL (ref <0.30)
Troponin I: <0.3 ng/mL (ref <0.30)

## 2014-01-28 MED ORDER — DOXYCYCLINE HYCLATE 100 MG PO TABS: 100.0000 mg | ORAL_TABLET | Freq: Two times a day (BID) | ORAL | Status: DC

## 2014-01-28 MED ORDER — FUROSEMIDE 40 MG PO TABS: 40.0000 mg | ORAL_TABLET | Freq: Every day | ORAL | Status: DC

## 2014-01-28 MED ORDER — POTASSIUM CHLORIDE ER 10 MEQ PO TBCR: 10.0000 meq | EXTENDED_RELEASE_TABLET | Freq: Every day | ORAL | Status: DC

## 2014-01-28 MED ORDER — GABAPENTIN 100 MG PO CAPS: 100.0000 mg | ORAL_CAPSULE | Freq: Three times a day (TID) | ORAL | Status: DC

## 2014-01-28 MED ORDER — GABAPENTIN 100 MG PO CAPS
100.0000 mg | ORAL_CAPSULE | Freq: Three times a day (TID) | ORAL | Status: DC
  Administered 2014-01-28: 100 mg via ORAL
  Filled 2014-01-28 (×3): qty 1

## 2014-01-28 MED ORDER — INSULIN ASPART 100 UNIT/ML ~~LOC~~ SOLN
10.0000 [IU] | Freq: Three times a day (TID) | SUBCUTANEOUS | Status: DC
  Administered 2014-01-28: 10 [IU] via SUBCUTANEOUS

## 2014-01-28 MED ORDER — DOXYCYCLINE HYCLATE 100 MG PO TABS
100.0000 mg | ORAL_TABLET | Freq: Two times a day (BID) | ORAL | Status: DC
Start: 2014-01-28 — End: 2014-01-28
  Administered 2014-01-28: 100 mg via ORAL
  Filled 2014-01-28 (×2): qty 1

## 2014-01-28 NOTE — Progress Notes (Signed)
  Echocardiogram 2D Echocardiogram has been performed.  Johny Chess 01/28/2014, 11:50 AM

## 2014-01-28 NOTE — Discharge Summary (Signed)
Physician Discharge Summary  Patient ID: [name] [name] MRN: [id number] DOB/AGE: Jan 10, 1941 73 y.o.  Admit date: 01/27/2014 Discharge date: 01/28/2014  Primary Care Physician:  Jilda Panda, MD  Discharge Diagnoses:   . Acute on chronic systolic CHF (congestive heart failure) . Right foot ulcer . Acute respiratory failure . CAD (coronary artery disease), native coronary artery . CVA (cerebral infarction) . HTN (hypertension)   Consults:  None   Recommendations for Outpatient Follow-up:  Patient was recommended to follow-up with Dr Einar Gip outpatient, please follow-up on Echo results  Allergies:   Allergies  Allergen Reactions  . Penicillins Swelling    Swelling of mouth     Discharge Medications:   Medication List    STOP taking these medications        hydrochlorothiazide 25 MG tablet  Commonly known as:  HYDRODIURIL      TAKE these medications        aspirin 81 MG EC tablet  Take 1 tablet (81 mg total) by mouth daily.     clopidogrel 75 MG tablet  Commonly known as:  PLAVIX  Take 75 mg by mouth daily.     doxycycline 100 MG tablet  Commonly known as:  VIBRA-TABS  Take 1 tablet (100 mg total) by mouth 2 (two) times daily.     escitalopram 20 MG tablet  Commonly known as:  LEXAPRO  Take 20 mg by mouth daily.     furosemide 40 MG tablet  Commonly known as:  LASIX  Take 1 tablet (40 mg total) by mouth daily.     gabapentin 100 MG capsule  Commonly known as:  NEURONTIN  Take 1 capsule (100 mg total) by mouth 3 (three) times daily.     insulin aspart 100 UNIT/ML injection  Commonly known as:  novoLOG  Inject 11 Units into the skin 3 (three) times daily with meals.     insulin glargine 100 UNIT/ML injection  Commonly known as:  LANTUS  Inject 20 Units into the skin at bedtime.     isosorbide-hydrALAZINE 20-37.5 MG per tablet  Commonly known as:  BIDIL  Take 1 tablet by mouth 3 (three) times daily.     lisinopril 5 MG tablet  Commonly known as:   PRINIVIL,ZESTRIL  Take 1 tablet (5 mg total) by mouth daily.     metoprolol tartrate 25 MG tablet  Commonly known as:  LOPRESSOR  Take 1 tablet (25 mg total) by mouth 2 (two) times daily.     nitroGLYCERIN 0.4 MG SL tablet  Commonly known as:  NITROSTAT  Place 0.4 mg under the tongue every 5 (five) minutes as needed for chest pain.     oxyCODONE 5 MG immediate release tablet  Commonly known as:  Oxy IR/ROXICODONE  Take 5 mg by mouth every 4 (four) hours as needed for severe pain.     potassium chloride SA 20 MEQ tablet  Commonly known as:  K-DUR,KLOR-CON  Take 1 tablet (20 mEq total) by mouth daily.     rosuvastatin 40 MG tablet  Commonly known as:  CRESTOR  Take 40 mg by mouth daily.     silver sulfADIAZINE 1 % cream  Commonly known as:  SILVADENE  Apply 1 application topically 2 (two) times daily. Apply with clean bandages two times daily to right foot     VITAMIN D PO  Take 1 tablet by mouth daily.         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 Patient has right foot ulcer, has a wound care nurse 3 days a week. Per home health nurse, patient had increased leg swelling and crackles, chest pressure. Patient presented to ED, was found to be hypoxic with O2 sats in 70s, no fevers or chills. Chest x-ray showed mild pulmonary edema. However CBG was noted to be 233. Anion gap 18. Patient was given 40 mg of IV Lasix, albuterol treatment with significant improvement. At the time of admission O2 sats were 99% on room air. BP 120/80, patient appeared comfortable  Hospital Course:   Acute respiratory failure: Likely due to acute on chronic systolic CHF Completely resolved, patient feels back to her baseline after IV diuresis. She was ruled out for acute ACS. BNP was  3218 at [facility]. Patient takes Lasix 20 mg daily at home, will increase to 40 mg daily. Discontinued HCTZ and continue K replacement upon discharge.  Patient follows with Dr Einar Gip and really wants to go home, d/w Dr Einar Gip, can follow-up in office next week, outpatient. Doppler ultrasound of the lower extremity was done and is negative for DVT. Echo done, results to be followed by Dr Einar Gip   Active Problems:  HTN (hypertension) - Currently stable, continue lasix and Bidil, metoprolol. HCTZ is discontinued.  History of CVA (cerebral infarction) - Continue aspirin and Plavix   CAD (coronary artery disease), native coronary artery: stable - Continue aspirin, Plavix, beta blocker, statin, troponins negative    Diabetes mellitus - Somewhat uncontrolled, patient had not taken her insulin at the time of admission.  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 Patient has home health nurse coming in 3times a week for dressing changes.    Day of Discharge BP 123/43 mmHg  Pulse 51  Temp(Src) 98.3 F (36.8 C) (Oral)  Resp 18  Ht 5\' 5"  (1.651 m)  Wt 78.79 kg (173 lb 11.2 oz)  BMI 28.91 kg/m2  SpO2 100%  Physical Exam: General: Alert and awake oriented x3 not in any acute distress. CVS: S1-S2 clear no murmur rubs or gallops Chest: clear to auscultation bilaterally, no wheezing rales or rhonchi Abdomen: soft nontender, nondistended, normal bowel sounds Extremities: no cyanosis, clubbing or edema noted bilaterally. Right LE wound Neuro: Cranial nerves II-XII intact, no focal neurological deficits   The results of significant diagnostics from this hospitalization (including imaging, microbiology, ancillary and laboratory) are listed below for reference.    LAB RESULTS: Basic Metabolic  Panel:  Recent Labs Lab 01/27/14 2022 01/28/14 0425  NA 143 143  K 3.5* 3.9  CL 103 105  CO2 27 25  GLUCOSE 130* 326*  BUN 13 13  CREATININE 0.83 0.83  CALCIUM 9.4 9.0  MG  --  2.0   Liver Function Tests:  Recent Labs Lab 01/28/14 0425  AST 18  ALT 25  ALKPHOS 58  BILITOT 0.4  PROT 6.5  ALBUMIN 3.1*   No results for input(s): LIPASE, AMYLASE in the last 168 hours. No results for input(s): AMMONIA in the last 168 hours. CBC:  Recent Labs Lab 01/27/14 1602 01/28/14 0425  WBC 6.4 6.3  NEUTROABS  --  4.3  HGB 11.8* 11.0*  HCT 38.8 35.3*  MCV 83.6 82.7  PLT 135* 159   Cardiac Enzymes:  Recent Labs Lab 01/27/14 2241 01/28/14 0425  TROPONINI <0.30 <0.30   BNP: Invalid input(s): POCBNP CBG:  Recent Labs Lab 01/28/14 0624 01/28/14 1201  GLUCAP 269* 230*    Significant Diagnostic Studies:  Dg Chest Port 1 View  01/27/2014   CLINICAL DATA:  Initial evaluation for shortness of breath for 1 day, personal history of myocardial infarction May 2015  EXAM: PORTABLE CHEST - 1 VIEW  COMPARISON:  07/13/2013  FINDINGS: Moderate cardiac enlargement status post CABG. Mild diffuse interstitial change. No definite Kerley B-lines. No consolidation or effusion.  IMPRESSION: Cardiac enlargement with minimal vascular congestion and interstitial prominence. Minimal pulmonary edema not excluded.   Electronically Signed   By: Skipper Cliche M.D.   On: 01/27/2014 15:38    2D ECHO: results pending   Disposition and Follow-up:     Discharge Instructions    ([place]) Call MD:  Anytime you have any of the following symptoms: 1) 3 pound weight gain in 24 hours or 5 pounds in 1 week 2) shortness of breath, with or without a dry hacking cough 3) swelling in the hands, feet or stomach 4) if you have to sleep on extra pillows at night in order to breathe.    Complete by:  As directed      Diet - low sodium heart healthy    Complete by:  As directed      Increase  activity slowly    Complete by:  As directed             DISPOSITION: home   DIET: carb modified   TESTS THAT NEED FOLLOW-UP echo  DISCHARGE FOLLOW-UP Follow-up Information    Follow up with [name], MD. Schedule an appointment as soon as possible for a visit in 10 days.   Specialty:  Cardiology   Why:  for hospital follow-up   Contact information:   [address] [place] [address] [phone]       Follow up with Jilda Panda, MD. Schedule an appointment as soon as possible for a visit in 2 weeks.   Specialty:  Internal Medicine   Why:  for hospital follow-up   Contact information:   [address] [place] [address] [phone]       Time spent on Discharge: 40 mins  Signed:   RAI,RIPUDEEP M.D. [facility] 01/28/2014, 2:14 PM Pager: [phone]

## 2014-01-28 NOTE — Progress Notes (Signed)
Pt. Arrived to floor via stretcher from ED in alert and stable condition. No s/s of distress or discomfort noted. Pt. Denies pain at this time. Pt. Placed on telemetry and call light within reach. RN will continue to monitor pt. For changes in condition. Shey Yott, Katherine Roan

## 2014-01-28 NOTE — Progress Notes (Signed)
Patient discharged to home with family. DC IV, DC tele. All discharge instructions explained and patient and family verbalizes understanding.

## 2014-01-28 NOTE — Progress Notes (Signed)
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 Patient has right foot ulcer, has a wound care nurse 3 days a week. Per home health nurse, patient had increased leg swelling and crackles, chest pressure. Patient presented to ED, was found to be hypoxic with O2 sats in 70s, no fevers or chills. Chest x-ray showed mild pulmonary edema. However CBG was noted to be 233. Anion gap 18. Patient was given 40 mg of IV Lasix, albuterol treatment with significant improvement. At the time of admission O2 sats were 99% on room air. BP 120/80, patient appeared comfortable.  Assessment/Plan: Principal Problem:   Acute respiratory failure: Likely due to acute on chronic systolic CHF Completely resolved, patient feels back to her baseline - Significant improve after admission of IV Lasix. Patient takes Lasix 20 mg daily at home, will increase to 40 mg daily - Discontinue HCTZ and continue K replacement - patient follows with Dr Einar Gip and really wants to go home, d/w Dr Einar Gip, can follow-up in office next week, outpatient. - I have requested vascular for Doppler ultrasound of the lower extremity. Echo done, results to be followed by Dr Einar Gip   Active Problems:   HTN (hypertension) - Currently stable,   History of  CVA (cerebral infarction) - Continue aspirin and Plavix    CAD (coronary artery disease), native coronary artery: stable - Continue aspirin, Plavix, beta blocker, statin, troponins negative     Diabetes mellitus - Somewhat uncontrolled, patient had not taken her insulin at the time of admission  -  Continue Lantus, meal coverage, sliding scale insulin    Hemiparesis and speech and language deficit as late effects of cerebrovascular  accident, Dysphagia    Right foot ulcer - cont wound care, placed on doxycycline   DVT Prophylaxis:  Code Status:  Family Communication: discussed in detail with patient's daughter   Disposition: Pending Doppler ultrasounds of the lower extremity, if negative for DVT, patient will be discharged home today.    Consult None  Procedures:  echo  Antibiotics:  doxy    Subject Patient seen and examined, has aphasia from prior stroke however able to communicate through writing, states that she feels 100% better, back to her baseline and wants to go home.  Objective: Weight change:   Intake/Output Summary (Last 24 hours) at 01/28/14 1145 Last data filed at 01/28/14 1056  Gross per 24 hour  Intake   1200 ml  Output    700 ml  Net    500 ml   Blood pressure 123/43, pulse 51, temperature 98.3 F (36.8 C), temperature source Oral, resp. rate 18, height 5\' 5"  (1.651 m), weight 78.79 kg (173 lb 11.2 oz), SpO2 100 %.  Physical Exam: General: Alert and awake, oriented x3, not in any acute distress. CVS: S1-S2 clear, no murmur rubs or gallops Chest: clear to auscultation bilaterally, no wheezing, rales or rhonchi Abdomen: soft nontender, nondistended, normal bowel sounds  Extremities: no cyanosis, clubbing, + edema noted bilaterally, right lower extremity wound    Lab Results: Basic Metabolic Panel:  Recent Labs Lab 01/27/14 2022 01/28/14 0425  NA 143 143  K 3.5* 3.9  CL 103 105  CO2 27 25  GLUCOSE 130* 326*  BUN 13 13  CREATININE 0.83 0.83  CALCIUM 9.4 9.0  MG  --  2.0   Liver Function Tests:  Recent Labs Lab 01/28/14 0425  AST 18  ALT 25  ALKPHOS 58  BILITOT 0.4  PROT 6.5  ALBUMIN 3.1*   No results for input(s): LIPASE, AMYLASE in the last 168 hours. No results for input(s): AMMONIA in the last 168 hours. CBC:  Recent Labs Lab 01/27/14 1602 01/28/14 0425  WBC 6.4 6.3  NEUTROABS  --  4.3  HGB 11.8* 11.0*  HCT 38.8 35.3*  MCV 83.6 82.7  PLT  135* 159   Cardiac Enzymes:  Recent Labs Lab 01/27/14 2241 01/28/14 0425  TROPONINI <0.30 <0.30   BNP: Invalid input(s): POCBNP CBG:  Recent Labs Lab 01/27/14 2134 01/28/14 0624  GLUCAP 133* 269*     Micro Results: No results found for this or any previous visit (from the past 240 hour(s)).  Studies/Results: Dg Chest Port 1 View  01/27/2014   CLINICAL DATA:  Initial evaluation for shortness of breath for 1 day, personal history of myocardial infarction May 2015  EXAM: PORTABLE CHEST - 1 VIEW  COMPARISON:  07/13/2013  FINDINGS: Moderate cardiac enlargement status post CABG. Mild diffuse interstitial change. No definite Kerley B-lines. No consolidation or effusion.  IMPRESSION: Cardiac enlargement with minimal vascular congestion and interstitial prominence. Minimal pulmonary edema not excluded.   Electronically Signed   By: Skipper Cliche M.D.   On: 01/27/2014 15:38    Medications: Scheduled Meds: . aspirin EC  81 mg Oral Daily  . clopidogrel  75 mg Oral Daily  . doxycycline  100 mg Oral Q12H  . enoxaparin (LOVENOX) injection  40 mg Subcutaneous Q24H  . escitalopram  20 mg Oral Daily  . furosemide  40 mg Intravenous Daily  . gabapentin  100 mg Oral TID  . insulin aspart  0-5 Units Subcutaneous QHS  . insulin aspart  0-9 Units Subcutaneous TID WC  . insulin glargine  20 Units Subcutaneous QHS  . isosorbide-hydrALAZINE  1 tablet Oral TID  . lisinopril  5 mg Oral Daily  . metoprolol tartrate  25 mg Oral BID  . rosuvastatin  40 mg Oral Daily  . silver sulfADIAZINE  1 application Topical BID  . sodium chloride  3 mL Intravenous Q12H      LOS: 1 day   Ivyanna Sibert M.D. [facility] 01/28/2014, 11:45 AM Pager: [phone]  If 7PM-7AM, please contact night-coverage [email or web address] Password [email or web address]

## 2014-01-28 NOTE — Progress Notes (Signed)
VASCULAR LAB PRELIMINARY  PRELIMINARY  PRELIMINARY  PRELIMINARY  Right lower extremity venous Doppler completed.    Preliminary report:  There is no obvious evidence of DVT or SVT noted in the right lower extremity.   Brandy Zuba, RVT 01/28/2014, 1:02 PM

## 2014-01-30 LAB — GLUCOSE, CAPILLARY: GLUCOSE-CAPILLARY: 90 mg/dL (ref 70–99)

## 2014-01-31 LAB — URINE CULTURE
Colony Count: >=100000
Special Requests: NORMAL

## 2014-02-02 ENCOUNTER — Encounter (HOSPITAL_COMMUNITY): Payer: Self-pay | Admitting: Cardiology

## 2014-12-18 ENCOUNTER — Ambulatory Visit
Admission: RE | Admit: 2014-12-18 | Discharge: 2014-12-18 | Disposition: A | Discharge status: Home / Self Care | Payer: Medicare HMO | Source: Ambulatory Visit | Attending: Internal Medicine | Admitting: Internal Medicine

## 2014-12-18 ENCOUNTER — Other Ambulatory Visit: Payer: Self-pay | Admitting: Internal Medicine

## 2014-12-18 DIAGNOSIS — M25571 Pain in right ankle and joints of right foot: Secondary | ICD-10-CM

## 2015-01-17 IMAGING — CR DG CHEST 1V PORT
1 series · 1 of 1 positions shown · non-contrast
Comparison: Chest radiograph 07/25/2011

CLINICAL DATA: Stroke

PORTABLE CHEST - 1 VIEW

[AP]
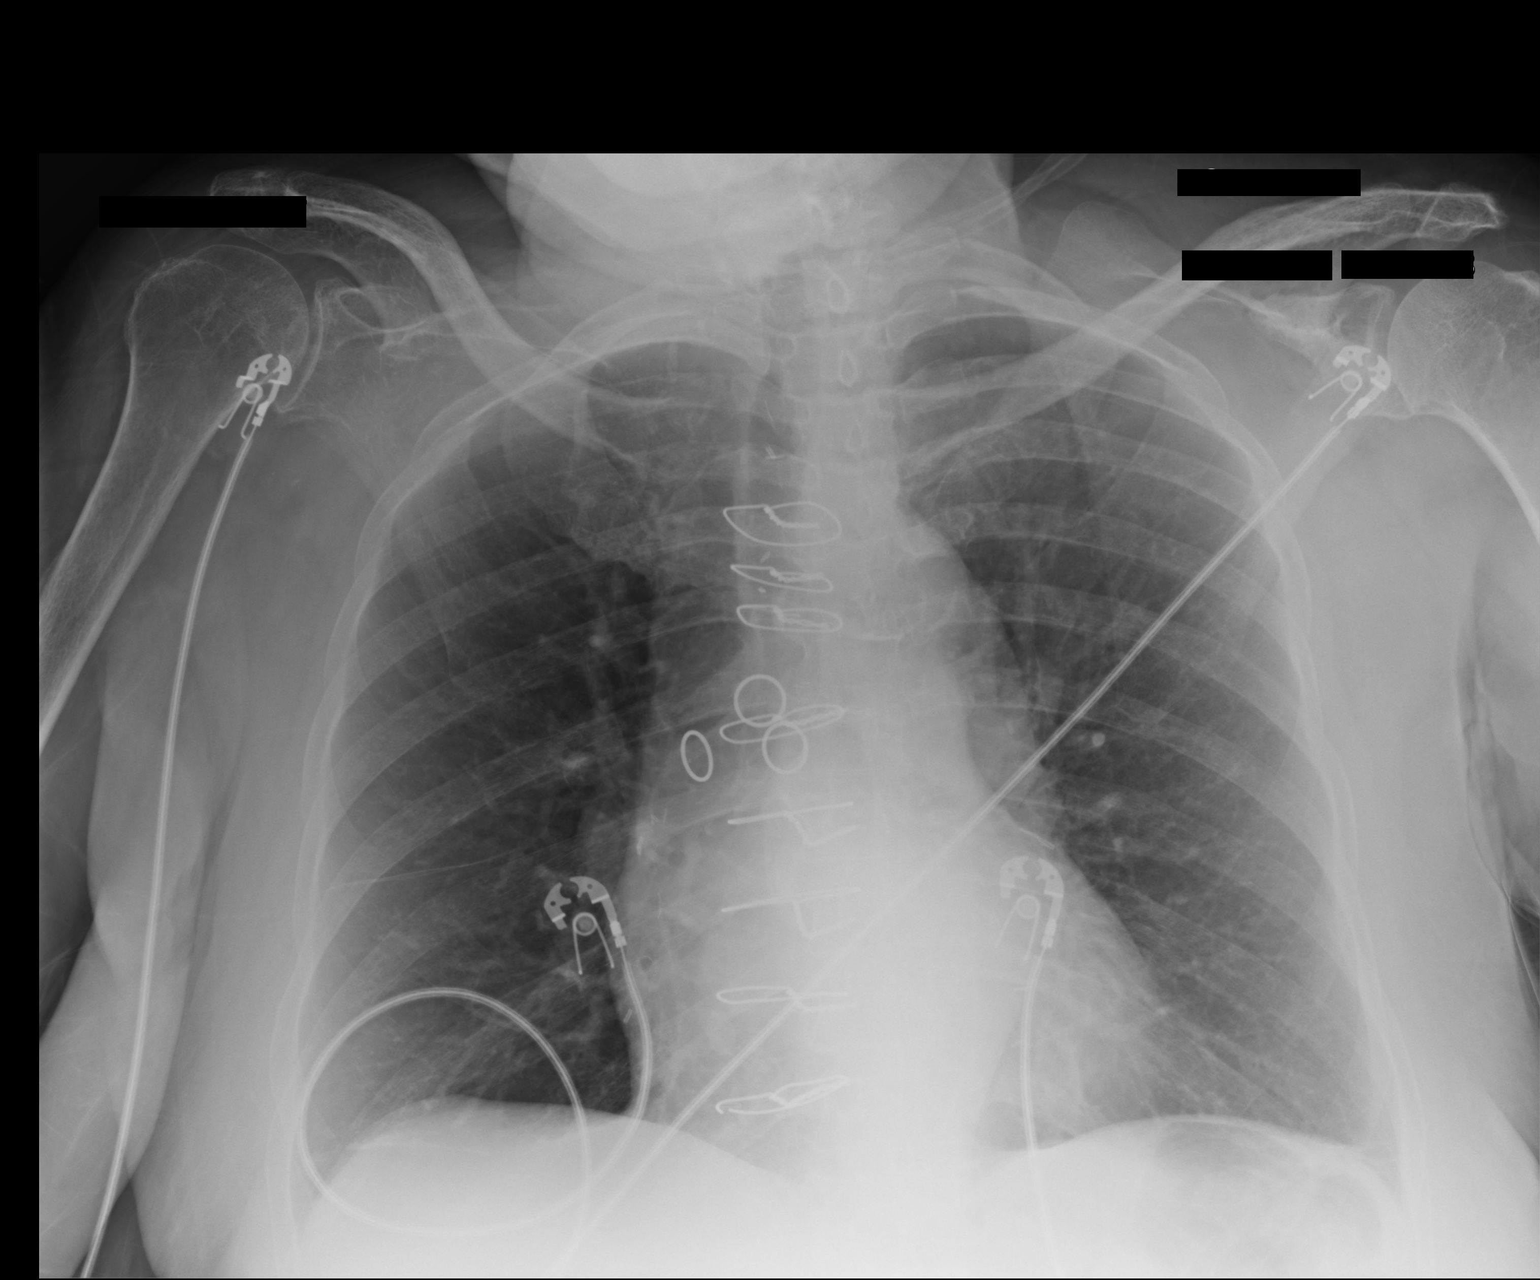

[1 of 1 positions shown; findings below may reference images not displayed]

FINDINGS: Normal mediastinum and cardiac silhouette.  Costophrenic
angles are clear.  No effusion, infiltrate, or pneumothorax.
IMPRESSION: No acute cardiopulmonary process.

## 2015-08-03 ENCOUNTER — Ambulatory Visit
Admission: RE | Admit: 2015-08-03 | Discharge: 2015-08-03 | Disposition: A | Discharge status: Home / Self Care | Payer: Medicare HMO | Source: Ambulatory Visit | Attending: Internal Medicine | Admitting: Internal Medicine

## 2015-08-03 ENCOUNTER — Other Ambulatory Visit: Payer: Self-pay | Admitting: Internal Medicine

## 2015-08-03 DIAGNOSIS — R0602 Shortness of breath: Secondary | ICD-10-CM

## 2015-09-25 ENCOUNTER — Ambulatory Visit (INDEPENDENT_AMBULATORY_CARE_PROVIDER_SITE_OTHER): Payer: Medicare HMO | Admitting: Sports Medicine

## 2015-09-25 ENCOUNTER — Encounter: Payer: Self-pay | Admitting: Sports Medicine

## 2015-09-25 DIAGNOSIS — B351 Tinea unguium: Secondary | ICD-10-CM

## 2015-09-25 DIAGNOSIS — E1142 Type 2 diabetes mellitus with diabetic polyneuropathy: Secondary | ICD-10-CM | POA: Diagnosis not present

## 2015-09-25 DIAGNOSIS — I739 Peripheral vascular disease, unspecified: Secondary | ICD-10-CM | POA: Diagnosis not present

## 2015-09-25 DIAGNOSIS — M79671 Pain in right foot: Secondary | ICD-10-CM

## 2015-09-25 DIAGNOSIS — M79672 Pain in left foot: Secondary | ICD-10-CM | POA: Diagnosis not present

## 2015-09-25 NOTE — Patient Instructions (Signed)
Diabetes and Foot Care Diabetes may cause you to have problems because of poor blood supply (circulation) to your feet and legs. This may cause the skin on your feet to become thinner, break easier, and heal more slowly. Your skin may become dry, and the skin may peel and crack. You may also have nerve damage in your legs and feet causing decreased feeling in them. You may not notice minor injuries to your feet that could lead to infections or more serious problems. Taking care of your feet is one of the most important things you can do for yourself.  HOME CARE INSTRUCTIONS  Wear shoes at all times, even in the house. Do not go barefoot. Bare feet are easily injured.  Check your feet daily for blisters, cuts, and redness. If you cannot see the bottom of your feet, use a mirror or ask someone for help.  Wash your feet with warm water (do not use hot water) and mild soap. Then pat your feet and the areas between your toes until they are completely dry. Do not soak your feet as this can dry your skin.  Apply a moisturizing lotion or petroleum jelly (that does not contain alcohol and is unscented) to the skin on your feet and to dry, brittle toenails. Do not apply lotion between your toes.  Trim your toenails straight across. Do not dig under them or around the cuticle. File the edges of your nails with an emery board or nail file.  Do not cut corns or calluses or try to remove them with medicine.  Wear clean socks or stockings every day. Make sure they are not too tight. Do not wear knee-high stockings since they may decrease blood flow to your legs.  Wear shoes that fit properly and have enough cushioning. To break in new shoes, wear them for just a few hours a day. This prevents you from injuring your feet. Always look in your shoes before you put them on to be sure there are no objects inside.  Do not cross your legs. This may decrease the blood flow to your feet.  If you find a minor scrape,  cut, or break in the skin on your feet, keep it and the skin around it clean and dry. These areas may be cleansed with mild soap and water. Do not cleanse the area with peroxide, alcohol, or iodine.  When you remove an adhesive bandage, be sure not to damage the skin around it.  If you have a wound, look at it several times a day to make sure it is healing.  Do not use heating pads or hot water bottles. They may burn your skin. If you have lost feeling in your feet or legs, you may not know it is happening until it is too late.  Make sure your health care provider performs a complete foot exam at least annually or more often if you have foot problems. Report any cuts, sores, or bruises to your health care provider immediately. SEEK MEDICAL CARE IF:   You have an injury that is not healing.  You have cuts or breaks in the skin.  You have an ingrown nail.  You notice redness on your legs or feet.  You feel burning or tingling in your legs or feet.  You have pain or cramps in your legs and feet.  Your legs or feet are numb.  Your feet always feel cold. SEEK IMMEDIATE MEDICAL CARE IF:   There is increasing redness,   swelling, or pain in or around a wound.  There is a red line that goes up your leg.  Pus is coming from a wound.  You develop a fever or as directed by your health care provider.  You notice a bad smell coming from an ulcer or wound.   This information is not intended to replace advice given to you by your health care provider. Make sure you discuss any questions you have with your health care provider.   Document Released: 02/08/2000 Document Revised: 10/13/2012 Document Reviewed: 07/20/2012 [employer] Interactive Patient Education 2016 [employer]  

## 2015-09-25 NOTE — Progress Notes (Signed)
Subjective: [name] [name] is a 75 y.o. female patient with history of diabetes who presents to office today complaining of long, painful nails; unable to trim. Patient states that the glucose reading this morning was 143mg /dl and is assisted by daughter who reports this since mom is limited in speech after stroke with Right sided weakness. Patient denies any new changes in medication or new problems. Patient denies any new cramping, numbness, burning or tingling in the legs. Tingling and swelling in legs unchanged from prior. Patient also desires diabetic shoes.   Patient Active Problem List   Diagnosis Date Noted  . Acute respiratory failure ([place]) 01/28/2014  . Respiratory distress 01/27/2014  . Hypoxia 01/27/2014  . CHF exacerbation ([place]) 01/27/2014  . Acute on chronic systolic CHF (congestive heart failure) ([place]) 01/27/2014  . Systolic CHF, acute on chronic ([place]) 01/27/2014  . Increased anion gap metabolic acidosis [id number]  . Right foot ulcer ([place]) 01/27/2014  . Aspiration pneumonia ([place]) 06/23/2012  . Dysphagia 06/21/2012  . Palliative care encounter 06/20/2012  . Healthcare-associated pneumonia 06/16/2012  . TIA (transient ischemic attack) 05/05/2012  . N&V (nausea and vomiting) 05/04/2012  . Weakness generalized 05/04/2012  . 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 NSTEMI (non-ST elevated myocardial infarction) ([place]) 07/27/2011  . Diabetes mellitus ([place])   . Chest pain 03/10/2011  . HTN (hypertension) 03/10/2011  . CVA (cerebral infarction) 03/10/2011  . CAD (coronary artery disease), native coronary artery 03/10/2011  . 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 S/P PTCA (percutaneous transluminal coronary angioplasty) 03/10/2011   Current Outpatient Prescriptions on File Prior to Visit  Medication Sig Dispense Refill  . aspirin EC 81 MG EC tablet Take 1 tablet (81 mg total) by mouth daily.    . Cholecalciferol (VITAMIN D PO)  Take 1 tablet by mouth daily.    . clopidogrel (PLAVIX) 75 MG tablet Take 75 mg by mouth daily.      Marland Kitchen doxycycline (VIBRA-TABS) 100 MG tablet Take 1 tablet (100 mg total) by mouth 2 (two) times daily. 28 tablet 0  . escitalopram (LEXAPRO) 20 MG tablet Take 20 mg by mouth daily.    . furosemide (LASIX) 40 MG tablet Take 1 tablet (40 mg total) by mouth daily. 30 tablet 3  . gabapentin (NEURONTIN) 100 MG capsule Take 1 capsule (100 mg total) by mouth 3 (three) times daily. 90 capsule 3  . insulin aspart (NOVOLOG) 100 UNIT/ML injection Inject 11 Units into the skin 3 (three) times daily with meals.    . insulin glargine (LANTUS) 100 UNIT/ML injection Inject 20 Units into the skin at bedtime.    . isosorbide-hydrALAZINE (BIDIL) 20-37.5 MG per tablet Take 1 tablet by mouth 3 (three) times daily. 90 tablet 6  . lisinopril (PRINIVIL,ZESTRIL) 5 MG tablet Take 1 tablet (5 mg total) by mouth daily. 30 tablet 6  . metoprolol tartrate (LOPRESSOR) 25 MG tablet Take 1 tablet (25 mg total) by mouth 2 (two) times daily. 60 tablet 6  . nitroGLYCERIN (NITROSTAT) 0.4 MG SL tablet Place 0.4 mg under the tongue every 5 (five) minutes as needed for chest pain.    Marland Kitchen oxyCODONE (OXY IR/ROXICODONE) 5 MG immediate release tablet Take 5 mg by mouth every 4 (four) hours as needed for severe pain.    . potassium chloride SA (K-DUR,KLOR-CON) 20 MEQ tablet Take 1 tablet (20 mEq total) by mouth daily. 30 tablet 1  . rosuvastatin (CRESTOR) 40 MG tablet Take 40 mg  by mouth daily.    . silver sulfADIAZINE (SILVADENE) 1 % cream Apply 1 application topically 2 (two) times daily. Apply with clean bandages two times daily to right foot     No current facility-administered medications on file prior to visit.    Allergies  Allergen Reactions  . Penicillins Swelling    Swelling of mouth    No results found for this or any previous visit (from the past 2160 hour(s)).  Objective: General: Patient is awake, alert, and oriented x 3 and  in no acute distress. Wheelchair assisted gait  Integument: Skin is warm, dry and supple bilateral. Nails are tender, long, thickened and  dystrophic with subungual debris, consistent with onychomycosis, 1-5 bilateral. No signs of infection. No open lesions or preulcerative lesions present bilateral. Old burn well healed dorsum of right foot. Remaining integument unremarkable.  Vasculature:  Dorsalis Pedis pulse 1/4 bilateral. Posterior Tibial pulse  0/4 bilateral.  Capillary fill time <5 sec 1-5 bilateral. No hair growth to the level of the digits. Temperature gradient within normal limits. No varicosities present bilateral. +1 edema present bilateral.   Neurology: The patient has absent sensation measured with a 5.07/10g Semmes Weinstein Monofilament at all pedal sites bilateral . Vibratory sensation absent bilateral with tuning fork. No Babinski sign present bilateral. Subjective tingling bilateral.   Musculoskeletal: Asymptomatic hammertoe pedal deformities noted bilateral. Muscular strength 4/5 on left 3/5 on right in all lower extremity muscular groups bilateral without pain on range of motion . No tenderness with calf compression bilateral.  Assessment and Plan: Problem List Items Addressed This Visit    None    Visit Diagnoses    Dermatophytosis of nail    -  Primary   Foot pain, bilateral       PVD (peripheral vascular disease) ([place])       Diabetic polyneuropathy associated with type 2 diabetes mellitus ([place])          -Examined patient. -Discussed and educated patient on diabetic foot care, especially with  regards to the vascular, neurological and musculoskeletal systems.  -Stressed the importance of good glycemic control and the detriment of not  controlling glucose levels in relation to the foot. -Mechanically debrided all nails 1-5 bilateral using sterile nail nipper and filed with dremel without incident  -[employer] diabetic shoe order form was completed; office to  contact primary care for approval / certification;  Office to arrange shoe fitting and dispensing. -Recommend follow up with PCP for lasix adjustment and for possible increase in Gabapentin  -Answered all patient questions -Patient to return  in 3 months for at risk foot care -Patient advised to call the office if any problems or questions arise in the meantime.  Landis Martins, DPM

## 2015-12-01 ENCOUNTER — Inpatient Hospital Stay (HOSPITAL_COMMUNITY): Payer: Medicare HMO

## 2015-12-01 ENCOUNTER — Encounter (HOSPITAL_COMMUNITY): Payer: Self-pay

## 2015-12-01 ENCOUNTER — Inpatient Hospital Stay (HOSPITAL_COMMUNITY)
Admission: EM | Admit: 2015-12-01 | Discharge: 2015-12-04 | DRG: 637 | Disposition: A | Discharge status: Skilled Nursing Facility | Payer: Medicare HMO | Attending: Internal Medicine | Admitting: Internal Medicine

## 2015-12-01 DIAGNOSIS — T383X5A Adverse effect of insulin and oral hypoglycemic [antidiabetic] drugs, initial encounter: Secondary | ICD-10-CM | POA: Diagnosis present

## 2015-12-01 DIAGNOSIS — I110 Hypertensive heart disease with heart failure: Secondary | ICD-10-CM | POA: Diagnosis present

## 2015-12-01 DIAGNOSIS — M6281 Muscle weakness (generalized): Secondary | ICD-10-CM

## 2015-12-01 DIAGNOSIS — T68XXXA Hypothermia, initial encounter: Secondary | ICD-10-CM

## 2015-12-01 DIAGNOSIS — R0989 Other specified symptoms and signs involving the circulatory and respiratory systems: Secondary | ICD-10-CM

## 2015-12-01 DIAGNOSIS — Y92003 Bedroom of unspecified non-institutional (private) residence as the place of occurrence of the external cause: Secondary | ICD-10-CM | POA: Diagnosis not present

## 2015-12-01 DIAGNOSIS — Z8249 Family history of ischemic heart disease and other diseases of the circulatory system: Secondary | ICD-10-CM

## 2015-12-01 DIAGNOSIS — Z833 Family history of diabetes mellitus: Secondary | ICD-10-CM | POA: Diagnosis not present

## 2015-12-01 DIAGNOSIS — Z951 Presence of aortocoronary bypass graft: Secondary | ICD-10-CM | POA: Diagnosis not present

## 2015-12-01 DIAGNOSIS — R7989 Other specified abnormal findings of blood chemistry: Secondary | ICD-10-CM | POA: Diagnosis not present

## 2015-12-01 DIAGNOSIS — K219 Gastro-esophageal reflux disease without esophagitis: Secondary | ICD-10-CM | POA: Diagnosis present

## 2015-12-01 DIAGNOSIS — R748 Abnormal levels of other serum enzymes: Secondary | ICD-10-CM

## 2015-12-01 DIAGNOSIS — Z79899 Other long term (current) drug therapy: Secondary | ICD-10-CM

## 2015-12-01 DIAGNOSIS — I69359 Hemiplegia and hemiparesis following cerebral infarction affecting unspecified side: Secondary | ICD-10-CM

## 2015-12-01 DIAGNOSIS — I69351 Hemiplegia and hemiparesis following cerebral infarction affecting right dominant side: Secondary | ICD-10-CM | POA: Diagnosis not present

## 2015-12-01 DIAGNOSIS — R680 Hypothermia, not associated with low environmental temperature: Secondary | ICD-10-CM | POA: Diagnosis present

## 2015-12-01 DIAGNOSIS — Z993 Dependence on wheelchair: Secondary | ICD-10-CM | POA: Diagnosis not present

## 2015-12-01 DIAGNOSIS — E7800 Pure hypercholesterolemia, unspecified: Secondary | ICD-10-CM | POA: Diagnosis present

## 2015-12-01 DIAGNOSIS — E119 Type 2 diabetes mellitus without complications: Secondary | ICD-10-CM | POA: Diagnosis not present

## 2015-12-01 DIAGNOSIS — G9340 Encephalopathy, unspecified: Secondary | ICD-10-CM | POA: Diagnosis present

## 2015-12-01 DIAGNOSIS — Z7902 Long term (current) use of antithrombotics/antiplatelets: Secondary | ICD-10-CM

## 2015-12-01 DIAGNOSIS — I69920 Aphasia following unspecified cerebrovascular disease: Secondary | ICD-10-CM | POA: Diagnosis not present

## 2015-12-01 DIAGNOSIS — I252 Old myocardial infarction: Secondary | ICD-10-CM

## 2015-12-01 DIAGNOSIS — Z794 Long term (current) use of insulin: Secondary | ICD-10-CM

## 2015-12-01 DIAGNOSIS — I10 Essential (primary) hypertension: Secondary | ICD-10-CM | POA: Diagnosis present

## 2015-12-01 DIAGNOSIS — R69 Illness, unspecified: Secondary | ICD-10-CM

## 2015-12-01 DIAGNOSIS — E1151 Type 2 diabetes mellitus with diabetic peripheral angiopathy without gangrene: Secondary | ICD-10-CM | POA: Diagnosis present

## 2015-12-01 DIAGNOSIS — E11649 Type 2 diabetes mellitus with hypoglycemia without coma: Secondary | ICD-10-CM | POA: Diagnosis present

## 2015-12-01 DIAGNOSIS — Z880 Allergy status to penicillin: Secondary | ICD-10-CM | POA: Diagnosis not present

## 2015-12-01 DIAGNOSIS — I5042 Chronic combined systolic (congestive) and diastolic (congestive) heart failure: Secondary | ICD-10-CM | POA: Diagnosis present

## 2015-12-01 DIAGNOSIS — R1313 Dysphagia, pharyngeal phase: Secondary | ICD-10-CM | POA: Diagnosis present

## 2015-12-01 DIAGNOSIS — R1311 Dysphagia, oral phase: Secondary | ICD-10-CM | POA: Diagnosis present

## 2015-12-01 DIAGNOSIS — R778 Other specified abnormalities of plasma proteins: Secondary | ICD-10-CM | POA: Diagnosis present

## 2015-12-01 DIAGNOSIS — I69328 Other speech and language deficits following cerebral infarction: Secondary | ICD-10-CM

## 2015-12-01 DIAGNOSIS — E162 Hypoglycemia, unspecified: Secondary | ICD-10-CM | POA: Diagnosis present

## 2015-12-01 DIAGNOSIS — F329 Major depressive disorder, single episode, unspecified: Secondary | ICD-10-CM | POA: Diagnosis present

## 2015-12-01 DIAGNOSIS — I2510 Atherosclerotic heart disease of native coronary artery without angina pectoris: Secondary | ICD-10-CM | POA: Diagnosis present

## 2015-12-01 DIAGNOSIS — I639 Cerebral infarction, unspecified: Secondary | ICD-10-CM | POA: Diagnosis present

## 2015-12-01 DIAGNOSIS — Z823 Family history of stroke: Secondary | ICD-10-CM | POA: Diagnosis not present

## 2015-12-01 LAB — CBC WITH DIFFERENTIAL/PLATELET
BASOS ABS: 0 10*3/uL (ref 0.0–0.1)
BASOS PCT: 0 %
EOS ABS: 0 10*3/uL (ref 0.0–0.7)
EOS PCT: 0 %
HCT: 41.8 % (ref 36.0–46.0)
Hemoglobin: 13.1 g/dL (ref 12.0–15.0)
LYMPHS PCT: 15 %
Lymphs Abs: 0.9 10*3/uL (ref 0.7–4.0)
MCH: 27.9 pg (ref 26.0–34.0)
MCHC: 31.3 g/dL (ref 30.0–36.0)
MCV: 88.9 fL (ref 78.0–100.0)
Monocytes Absolute: 0.3 10*3/uL (ref 0.1–1.0)
Monocytes Relative: 4 %
NEUTROS PCT: 81 %
Neutro Abs: 4.5 10*3/uL (ref 1.7–7.7)
PLATELETS: 159 10*3/uL (ref 150–400)
RBC: 4.7 MIL/uL (ref 3.87–5.11)
RDW: 14.5 % (ref 11.5–15.5)
WBC: 5.7 10*3/uL (ref 4.0–10.5)

## 2015-12-01 LAB — URINE MICROSCOPIC-ADD ON

## 2015-12-01 LAB — COMPREHENSIVE METABOLIC PANEL
ALK PHOS: 56 U/L (ref 38–126)
ALT: 15 U/L (ref 14–54)
AST: 30 U/L (ref 15–41)
Albumin: 4 g/dL (ref 3.5–5.0)
Anion gap: 9 (ref 5–15)
BUN: 19 mg/dL (ref 6–20)
CALCIUM: 9.8 mg/dL (ref 8.9–10.3)
CO2: 22 mmol/L (ref 22–32)
CREATININE: 0.9 mg/dL (ref 0.44–1.00)
Chloride: 110 mmol/L (ref 101–111)
GFR calc Af Amer: >60 mL/min (ref >60)
Glucose, Bld: 53 mg/dL — ABNORMAL LOW (ref 65–99)
Potassium: 4.2 mmol/L (ref 3.5–5.1)
Sodium: 141 mmol/L (ref 135–145)
TOTAL PROTEIN: 7.7 g/dL (ref 6.5–8.1)
Total Bilirubin: 0.8 mg/dL (ref 0.3–1.2)

## 2015-12-01 LAB — BRAIN NATRIURETIC PEPTIDE: B NATRIURETIC PEPTIDE 5: 359.5 pg/mL — AB (ref 0.0–100.0)

## 2015-12-01 LAB — GLUCOSE, CAPILLARY
GLUCOSE-CAPILLARY: 67 mg/dL (ref 65–99)
GLUCOSE-CAPILLARY: 146 mg/dL — AB (ref 65–99)

## 2015-12-01 LAB — URINALYSIS, ROUTINE W REFLEX MICROSCOPIC
Bilirubin Urine: NEGATIVE
Glucose, UA: NEGATIVE mg/dL
KETONES UR: 15 mg/dL — AB
LEUKOCYTES UA: NEGATIVE
NITRITE: NEGATIVE
PROTEIN: NEGATIVE mg/dL
Specific Gravity, Urine: 1.02 (ref 1.005–1.030)
pH: 5.5 (ref 5.0–8.0)

## 2015-12-01 LAB — TROPONIN I
Troponin I: 0.12 ng/mL (ref <0.03)
Troponin I: 0.11 ng/mL (ref <0.03)

## 2015-12-01 LAB — CBG MONITORING, ED
Glucose-Capillary: 87 mg/dL (ref 65–99)
Glucose-Capillary: 91 mg/dL (ref 65–99)

## 2015-12-01 LAB — CK: Total CK: 345 U/L — ABNORMAL HIGH (ref 38–234)

## 2015-12-01 MED ORDER — ENOXAPARIN SODIUM 40 MG/0.4ML ~~LOC~~ SOLN
40.0000 mg | SUBCUTANEOUS | Status: DC
  Administered 2015-12-01 – 2015-12-03 (×3): 40 mg via SUBCUTANEOUS
  Filled 2015-12-01 (×3): qty 0.4

## 2015-12-01 MED ORDER — DEXTROSE 50 % IV SOLN
25.0000 mL | INTRAVENOUS | Status: DC | PRN
  Administered 2015-12-01: 25 mL via INTRAVENOUS
  Filled 2015-12-01: qty 50

## 2015-12-01 MED ORDER — METOPROLOL TARTRATE 5 MG/5ML IV SOLN
5.0000 mg | Freq: Three times a day (TID) | INTRAVENOUS | Status: DC
  Administered 2015-12-01 – 2015-12-02 (×2): 5 mg via INTRAVENOUS
  Filled 2015-12-01 (×2): qty 5

## 2015-12-01 MED ORDER — ASPIRIN 81 MG PO CHEW
324.0000 mg | CHEWABLE_TABLET | Freq: Once | ORAL | Status: DC
  Filled 2015-12-01: qty 4

## 2015-12-01 MED ORDER — DEXTROSE 10 % IV SOLN
INTRAVENOUS | Status: DC
  Administered 2015-12-01: 50 mL/h via INTRAVENOUS

## 2015-12-01 MED ORDER — ALPRAZOLAM 0.5 MG PO TABS: 0.2500 mg | ORAL_TABLET | Freq: Two times a day (BID) | ORAL | Status: DC | PRN

## 2015-12-01 MED ORDER — HYDRALAZINE HCL 20 MG/ML IJ SOLN: 5.0000 mg | INTRAMUSCULAR | Status: DC | PRN

## 2015-12-01 MED ORDER — FUROSEMIDE 10 MG/ML IJ SOLN: 20.0000 mg | Freq: Every day | INTRAMUSCULAR | Status: DC

## 2015-12-01 MED ORDER — ONDANSETRON HCL 4 MG/2ML IJ SOLN: 4.0000 mg | Freq: Four times a day (QID) | INTRAMUSCULAR | Status: DC | PRN

## 2015-12-01 MED ORDER — NITROGLYCERIN 0.4 MG SL SUBL: 0.4000 mg | SUBLINGUAL_TABLET | SUBLINGUAL | Status: DC | PRN

## 2015-12-01 MED ORDER — ACETAMINOPHEN 325 MG PO TABS
650.0000 mg | ORAL_TABLET | ORAL | Status: DC | PRN
  Administered 2015-12-03: 650 mg via ORAL
  Filled 2015-12-01: qty 2

## 2015-12-01 MED ORDER — ASPIRIN 300 MG RE SUPP
150.0000 mg | Freq: Every day | RECTAL | Status: DC
  Administered 2015-12-01: 150 mg via RECTAL
  Filled 2015-12-01: qty 1

## 2015-12-01 MED ORDER — INSULIN ASPART 100 UNIT/ML ~~LOC~~ SOLN
0.0000 [IU] | Freq: Three times a day (TID) | SUBCUTANEOUS | Status: DC
  Administered 2015-12-02: 5 [IU] via SUBCUTANEOUS
  Administered 2015-12-03: 2 [IU] via SUBCUTANEOUS
  Administered 2015-12-03: 3 [IU] via SUBCUTANEOUS
  Administered 2015-12-03: 5 [IU] via SUBCUTANEOUS
  Administered 2015-12-04 (×2): 3 [IU] via SUBCUTANEOUS

## 2015-12-01 NOTE — ED Notes (Signed)
pts daughter at bedside.  Reports when she came home @ 2:30p and found pt in wheelchair slumped over with no clothes on.  Pt had just come from taking bath, around 10 or 10:30 am.

## 2015-12-01 NOTE — H&P (Addendum)
History and Physical    [name] [name] [id number] DOB: 1940/12/25 DOA: 12/01/2015  Referring MD/NP/PA:   PCP: Myriam Jacobson, MD   Patient coming from:  The patient is coming from home.  At baseline, pt is independent for most of ADL.   Chief Complaint: AMS, hypoglycemia  HPI: [name] [name] is a 75 y.o. 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  Per report, pt was found to be confused and lethargic in bed at about 2:30 PM when her daughter came home. She was found to have  blood sugar was 22. She was given a bolus of dextrose by EMS and blood sugar increased to 168. At that point her mental status was back to baseline. She has hx of stroke with right sided hemiparesis and nonverbal at baseline, which seems to be at her baseline. When I saw pt in ED, she could communicate appropriately by nodding or shaking head and writing on paper. She wrote on paper that sometimes she had difficult swallowing. She denies chest pain, cough, shortness of breath, nausea, vomiting, abdominal pain. She wrote that sometimes she had hot flashes when she urinates, but denies dysuria or burning on urination.  ED Course: pt was found to have hypoglycemia with sugar 22-->91 now, positive troponin 0.12, WBC 5.7, CK 345, negative urinalysis, hyperthermia, which has resolved, no tachycardia, slightly tachycardia, oxygen saturation at 8% on room air, electrolytes and renal function okay. Patient is admitted to telemetry bed as inpatient.  Review of Systems:   General: no fevers, chills, no changes in body weight HEENT: no blurry vision, hearing changes or sore throat Respiratory: no  dyspnea, coughing, wheezing CV: no chest pain, no palpitations GI: no nausea, vomiting, abdominal pain, diarrhea, constipation GU: no dysuria, burning on urination, increased urinary frequency, hematuria  Ext: has mild leg edema Neuro: No vision change or hearing loss. Right-sided hemiparesis, nonverbal Skin: no rash, no skin tear. MSK: No muscle spasm, no deformity, no limitation of range of movement in spin Heme: No easy bruising.  Travel history: No recent long distant travel.  Allergy:  Allergies  Allergen Reactions  . Penicillins Swelling    Swelling of mouth Has patient had a PCN reaction causing immediate rash, facial/tongue/throat swelling, SOB or lightheadedness with hypotension: YES Has patient had a PCN reaction causing severe rash involving mucus membranes or skin necrosis: NO Has patient had a PCN reaction that required hospitalization NO Has patient had a PCN reaction occurring within the last 10 years: NO If all of the above answers are "NO", then may proceed with Cephalosporin use.    Past Medical History:  Diagnosis Date  . Angina   . CHF (congestive heart failure) ([place])   . Complication of anesthesia    lung   . Diabetes mellitus ([place])   . GERD (gastroesophageal reflux disease)   . H/O hiatal hernia   . Hypercholesteremia   . Hypertension   . Myocardial infarction    CABG 2001, ? stent. Cath 02/2011 with new distal LIMA-LAD 80%. SVG-OM occlusion is old, rest of grafts patent  . Peripheral vascular disease ([place])   . Stroke [facility]) 2002 and 2003   Resultant right hemiparesis and aphasia. Communicates by writing     Past Surgical History:  Procedure Laterality Date  . ABDOMINAL HYSTERECTOMY    . BREAST BIOPSY    . CARDIAC CATHETERIZATION  02/2011  . CORONARY ARTERY BYPASS GRAFT    . ESOPHAGOGASTRODUODENOSCOPY  03/12/2011   Procedure: ESOPHAGOGASTRODUODENOSCOPY (EGD);  Surgeon: [name], MD;  Location: [facility] ENDOSCOPY;  Service: Endoscopy;  Laterality: N/A;    . LEFT HEART CATHETERIZATION WITH CORONARY ANGIOGRAM N/A 03/10/2011   Procedure: LEFT HEART CATHETERIZATION WITH CORONARY ANGIOGRAM;  Surgeon: [name], MD;  Location: [facility] CATH LAB;  Service: Cardiovascular;  Laterality: N/A;    Social History:  reports that she has never smoked. She does not have any smokeless tobacco history on file. She reports that she does not drink alcohol or use drugs.  Family History:  Family History  Problem Relation Age of Onset  . Heart disease Mother   . Stroke Father   . Diabetes Sister   . Diabetes Brother   . Anesthesia problems Neg Hx   . Hypotension Neg Hx   . Malignant hyperthermia Neg Hx   . Pseudochol deficiency Neg Hx      Prior to Admission medications   Medication Sig Start Date End Date Taking? Authorizing Provider  clopidogrel (PLAVIX) 75 MG tablet Take 75 mg by mouth daily.     Yes Historical Provider, MD  escitalopram (LEXAPRO) 20 MG tablet Take 20 mg by mouth daily.   Yes Historical Provider, MD  furosemide (LASIX) 40 MG tablet Take 1 tablet (40 mg total) by mouth daily. 01/28/14  Yes Ripudeep Krystal Eaton, MD  gabapentin (NEURONTIN) 100 MG capsule Take 1 capsule (100 mg total) by mouth 3 (three) times daily. 01/28/14  Yes Ripudeep Krystal Eaton, MD  insulin aspart (NOVOLOG) 100 UNIT/ML injection Inject 11 Units into the skin 3 (three) times daily with meals.   Yes Historical Provider, MD  insulin glargine (LANTUS) 100 UNIT/ML injection Inject 20 Units into the skin at bedtime.   Yes Historical Provider, MD  isosorbide-hydrALAZINE (BIDIL) 20-37.5 MG per tablet Take 1 tablet by mouth 3 (three) times daily. 07/20/13  Yes [name] Prows, MD  lisinopril (PRINIVIL,ZESTRIL) 5 MG tablet Take 1 tablet (5 mg total) by mouth daily. 07/20/13  Yes [name] Prows, MD  metoprolol tartrate (LOPRESSOR) 25 MG tablet Take 1 tablet (25 mg total) by mouth 2 (two) times daily. 07/20/13  Yes [name] Prows, MD  oxyCODONE (OXY IR/ROXICODONE) 5 MG immediate release tablet Take 5 mg by mouth  every 4 (four) hours as needed for severe pain.   Yes Historical Provider, MD  rosuvastatin (CRESTOR) 40 MG tablet Take 40 mg by mouth daily.   Yes Historical Provider, MD  aspirin EC 81 MG EC tablet Take 1 tablet (81 mg total) by mouth daily. Patient not taking: Reported on 12/01/2015 07/20/13   [name] Prows, MD  doxycycline (VIBRA-TABS) 100 MG tablet Take 1 tablet (100 mg total) by mouth 2 (two) times daily. Patient not taking: Reported on 12/01/2015 01/28/14   Ripudeep Krystal Eaton, MD  nitroGLYCERIN (NITROSTAT) 0.4 MG SL tablet Place 0.4 mg under the tongue every 5 (five) minutes as needed for chest pain.    Historical Provider, MD  potassium chloride SA (K-DUR,KLOR-CON) 20 MEQ tablet Take 1 tablet (20 mEq total) by mouth daily. Patient not taking: Reported on 12/01/2015 07/20/13   [name] Prows, MD    Physical Exam: Vitals:   12/01/15 1915 12/01/15 1945 12/01/15 2015 12/01/15 2143  BP: 175/92 150/61 (!) 170/102 (!) 159/64  Pulse: 87 65 85 71  Resp:  22 17 19    Temp:    98.4 F (36.9 C)  TempSrc:    Oral  SpO2: 98% 100% 99% 100%  Weight:    81.6 kg (180 lb)   General: Not in acute distress HEENT:       Eyes: PERRL, EOMI, no scleral icterus.       ENT: No discharge from the ears and nose, no pharynx injection, no tonsillar enlargement.        Neck: No JVD, no bruit, no mass felt. Heme: No neck lymph node enlargement. Cardiac: S1/S2, RRR, No murmurs, No gallops or rubs. Respiratory: has rhonchi bilaterally. No rales, wheezing, rubs. GI: Soft, nondistended, nontender, no rebound pain, no organomegaly, BS present. GU: No hematuria Ext: has trace leg edema bilaterally. 2+DP/PT pulse bilaterally. Musculoskeletal: No joint deformities, No joint redness or warmth, no limitation of ROM in spin. Skin: No rashes.  Neuro: Alert, cranial nerves II-XII grossly intact, right-sided hemiparesis. Psych: Patient is not psychotic, no suicidal or hemocidal ideation.  Labs on Admission: I have personally reviewed  following labs and imaging studies  CBC:  Recent Labs Lab 12/01/15 1716  WBC 5.7  NEUTROABS 4.5  HGB 13.1  HCT 41.8  MCV 88.9  PLT [id number]   Basic Metabolic Panel:  Recent Labs Lab 12/01/15 1716  NA 141  K 4.2  CL 110  CO2 22  GLUCOSE 53*  BUN 19  CREATININE 0.90  CALCIUM 9.8   GFR: CrCl cannot be calculated (Unknown ideal weight.). Liver Function Tests:  Recent Labs Lab 12/01/15 1716  AST 30  ALT 15  ALKPHOS 56  BILITOT 0.8  PROT 7.7  ALBUMIN 4.0   No results for input(s): LIPASE, AMYLASE in the last 168 hours. No results for input(s): AMMONIA in the last 168 hours. Coagulation Profile: No results for input(s): INR, PROTIME in the last 168 hours. Cardiac Enzymes:  Recent Labs Lab 12/01/15 1716 12/01/15 2113  CKTOTAL  --  345*  TROPONINI 0.12* 0.11*   BNP (last 3 results) No results for input(s): PROBNP in the last 8760 hours. HbA1C: No results for input(s): HGBA1C in the last 72 hours. CBG:  Recent Labs Lab 12/01/15 1548 12/01/15 2007 12/01/15 2148  GLUCAP 87 91 67   Lipid Profile: No results for input(s): CHOL, HDL, LDLCALC, TRIG, CHOLHDL, LDLDIRECT in the last 72 hours. Thyroid Function Tests: No results for input(s): TSH, T4TOTAL, FREET4, T3FREE, THYROIDAB in the last 72 hours. Anemia Panel: No results for input(s): VITAMINB12, FOLATE, FERRITIN, TIBC, IRON, RETICCTPCT in the last 72 hours. Urine analysis:    Component Value Date/Time   COLORURINE YELLOW 12/01/2015 1839   APPEARANCEUR CLEAR 12/01/2015 1839   LABSPEC 1.020 12/01/2015 1839   PHURINE 5.5 12/01/2015 1839   GLUCOSEU NEGATIVE 12/01/2015 1839   HGBUR TRACE (A) 12/01/2015 1839   BILIRUBINUR NEGATIVE 12/01/2015 1839   KETONESUR 15 (A) 12/01/2015 1839   PROTEINUR NEGATIVE 12/01/2015 1839   UROBILINOGEN 1.0 07/13/2013 2255   NITRITE NEGATIVE 12/01/2015 1839   LEUKOCYTESUR NEGATIVE 12/01/2015 1839   Sepsis Labs: @LABRCNTIP (procalcitonin:4,lacticidven:4) )No results found  for this or any previous visit (from the past 240 hour(s)).   Radiological Exams on Admission: Dg Chest Port 1 View  Result Date: 12/01/2015 CLINICAL DATA:  Altered mental status. History of CVA with right-sided deficit. EXAM: PORTABLE CHEST 1 VIEW COMPARISON:  10/03/2015 FINDINGS: Postsurgical changes from CABG. The cardiac silhouette is stably enlarged. Mediastinal contours appear intact. There is no evidence of focal airspace consolidation, pleural effusion or  pneumothorax. Osseous structures are without acute abnormality. Soft tissues are grossly normal. IMPRESSION: Enlarged cardiac silhouette, otherwise no evidence of acute cardiopulmonary process. Electronically Signed   By: Fidela Salisbury M.D.   On: 12/01/2015 21:15     EKG: Independently reviewed.  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 Her mental status seems to be back to baseline with improvement of her sugar level.  -will admit to tele bed as ip (This patient has multiple chronic comorbidities as listed in HPI. Now patient has elevated troponin, altered mental status, hypoglycemia and difficulty swallowing. Patient requires inpatient status due to high intensity of service, high risk for further deterioration and high frequency of surveillance required. I certify that at the point of admission it is my clinical judgment that the patient will require inpatient  hospital care spanning beyond 2 midnights from the point of admission). -hold long lasting insulin -CBG q1h and prn D50 -neuro check frequently  Hypoglycemia: Etiology is not clear, but likely due to inappropriate dosing insulin. Improved, 91 now. -cbg q1h -D10 at 50 cc/h -prn D50  Elevated trop and hx CAD: s/p of CABG: Troponin 0.12. Patient denies chest pain. Likely due to demand ischemia secondary to hypoglycemia. - will place on Tele bed for obs - cycle CE q6 x3 and repeat her EKG in the am  - prn Nitroglycerin, aspirin per rectum, crestor, Metoprolol - Risk factor stratification: will check FLP and A1C  - 2d echo  Hx of stroke and difficulty swallowing: has hemiparesis and speech and language deficit from previous stroke. Pt failed bedside swallowing screening per RN -Hold all oral meds -Aspirin per rectum -SLP  HTN: bp 134/100 -Hold oral meds -IV metoprolol with holding parameters -IV hydralazine.  DM-II: Last A1c 7.8, poorly controled. Patient is taking NovoLog and Lantus at home. Now presents with hypoglycemia -hold Lantus due to hypoglycemia. -SSI when blood sugar is stabilized -Consult to diabetic educator  Chronic combined systolic and diastolic CHF (congestive heart failure): 2-D echo on 01/28/49 showed EF 30-35% with grade 3 diastolic dysfunction. Patient has trace amount of leg edema, no JVD. Respiratory function okay. Clinically CHF is compensated. -Switch oral Lasix 40 mg daily to IV 20 mg daily -Continue aspirin and metoprolol as above -check BNP   DVT ppx: SQ Lovenox Code Status: Full code Family Communication: None at bed side.   Disposition Plan:  Anticipate discharge back to previous home environment Consults called:  none Admission status: Obs / tele    Date of Service 12/01/2015    Ivor Costa [facility] Pager [phone]  If 7PM-7AM, please contact night-coverage [email or web address] Password [email or web address] 12/01/2015, 10:43 PM

## 2015-12-01 NOTE — ED Provider Notes (Addendum)
[facility] DEPT Provider Note   CSN: [id number] Arrival date & time: 12/01/15  1536     History   Chief Complaint Chief Complaint  Patient presents with  . Hypoglycemia  . Altered Mental Status    HPI [name] [name] is a 75 y.o. female.  HPI The patient has had problems with her blood sugar getting high and low. She has not had any changes to her insulin dosing even though over the past week or 2 she has had episodes of having glycemia. Patient reports that she ate breakfast at about 8 AM. She reports she took her insulin at about 9. Family members estimate that she must to become hypoglycemic around 10:30. The patient lives with her grandchild but she was not found until approximately 2 PM. Her daughter came home and found her very lethargic in her bed. Blood sugar was 22. She was given a bolus of dextrose by EMS and blood sugar increased to 168. At that point she was back to baseline. Patient has had stroke. She does take her own medications. Her speech is garbled due to stroke but she is aware of her surroundings and appropriate. At baseline she transfers to a motorized wheelchair. Patient denies she's felt sick lately. Other than was waxing and waning blood sugar she feels that she is at her baseline. Past Medical History:  Diagnosis Date  . Angina   . CHF (congestive heart failure) ([place])   . Complication of anesthesia    lung   . Diabetes mellitus ([place])   . GERD (gastroesophageal reflux disease)   . H/O hiatal hernia   . Hypercholesteremia   . Hypertension   . Myocardial infarction    CABG 2001, ? stent. Cath 02/2011 with new distal LIMA-LAD 80%. SVG-OM occlusion is old, rest of grafts patent  . Peripheral vascular disease ([place])   . Stroke [facility]) 2002 and 2003   Resultant right hemiparesis and aphasia. Communicates by writing     Patient Active Problem List   Diagnosis Date Noted  . Chronic combined systolic and diastolic CHF (congestive heart failure) ([place]) 12/01/2015    . Acute encephalopathy 12/01/2015  . Hypoglycemia 12/01/2015  . Troponin I above reference range 12/01/2015  . Acute respiratory failure ([place]) 01/28/2014  . Respiratory distress 01/27/2014  . Hypoxia 01/27/2014  . CHF exacerbation ([place]) 01/27/2014  . Acute on chronic systolic CHF (congestive heart failure) ([place]) 01/27/2014  . Systolic CHF, acute on chronic ([place]) 01/27/2014  . Increased anion gap metabolic acidosis [id number]  . Right foot ulcer ([place]) 01/27/2014  . Aspiration pneumonia ([place]) 06/23/2012  . Dysphagia 06/21/2012  . Palliative care encounter 06/20/2012  . Healthcare-associated pneumonia 06/16/2012  . TIA (transient ischemic attack) 05/05/2012  . N&V (nausea and vomiting) 05/04/2012  . Weakness generalized 05/04/2012  . 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 NSTEMI (non-ST elevated myocardial infarction) ([place]) 07/27/2011  . Diabetes mellitus ([place])   . Chest pain 03/10/2011  . HTN (hypertension) 03/10/2011  . Cerebral infarction ([place]) 03/10/2011  . CAD (coronary artery disease), native coronary artery 03/10/2011  . 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 S/P PTCA (percutaneous transluminal coronary angioplasty) 03/10/2011    Past Surgical History:  Procedure Laterality Date  . ABDOMINAL HYSTERECTOMY    . BREAST BIOPSY    . CARDIAC CATHETERIZATION  02/2011  . CORONARY ARTERY BYPASS GRAFT    . ESOPHAGOGASTRODUODENOSCOPY  03/12/2011   Procedure: ESOPHAGOGASTRODUODENOSCOPY (EGD);  Surgeon: [name],  MD;  Location: [facility];  Service: Endoscopy;  Laterality: N/A;  . LEFT HEART CATHETERIZATION WITH CORONARY ANGIOGRAM N/A 03/10/2011   Procedure: LEFT HEART CATHETERIZATION WITH CORONARY ANGIOGRAM;  Surgeon: [name], MD;  Location: [facility] CATH LAB;  Service: Cardiovascular;  Laterality: N/A;    OB History    No data available       Home Medications    Prior to Admission medications   Medication Sig  Start Date End Date Taking? Authorizing Provider  clopidogrel (PLAVIX) 75 MG tablet Take 75 mg by mouth daily.     Yes Historical Provider, MD  escitalopram (LEXAPRO) 20 MG tablet Take 20 mg by mouth daily.   Yes Historical Provider, MD  furosemide (LASIX) 40 MG tablet Take 1 tablet (40 mg total) by mouth daily. 01/28/14  Yes Ripudeep Krystal Eaton, MD  gabapentin (NEURONTIN) 100 MG capsule Take 1 capsule (100 mg total) by mouth 3 (three) times daily. 01/28/14  Yes Ripudeep Krystal Eaton, MD  insulin aspart (NOVOLOG) 100 UNIT/ML injection Inject 11 Units into the skin 3 (three) times daily with meals.   Yes Historical Provider, MD  insulin glargine (LANTUS) 100 UNIT/ML injection Inject 20 Units into the skin at bedtime.   Yes Historical Provider, MD  isosorbide-hydrALAZINE (BIDIL) 20-37.5 MG per tablet Take 1 tablet by mouth 3 (three) times daily. 07/20/13  Yes [name] Prows, MD  lisinopril (PRINIVIL,ZESTRIL) 5 MG tablet Take 1 tablet (5 mg total) by mouth daily. 07/20/13  Yes [name] Prows, MD  metoprolol tartrate (LOPRESSOR) 25 MG tablet Take 1 tablet (25 mg total) by mouth 2 (two) times daily. 07/20/13  Yes [name] Prows, MD  oxyCODONE (OXY IR/ROXICODONE) 5 MG immediate release tablet Take 5 mg by mouth every 4 (four) hours as needed for severe pain.   Yes Historical Provider, MD  rosuvastatin (CRESTOR) 40 MG tablet Take 40 mg by mouth daily.   Yes Historical Provider, MD  aspirin EC 81 MG EC tablet Take 1 tablet (81 mg total) by mouth daily. Patient not taking: Reported on 12/01/2015 07/20/13   [name] Prows, MD  doxycycline (VIBRA-TABS) 100 MG tablet Take 1 tablet (100 mg total) by mouth 2 (two) times daily. Patient not taking: Reported on 12/01/2015 01/28/14   Ripudeep Krystal Eaton, MD  nitroGLYCERIN (NITROSTAT) 0.4 MG SL tablet Place 0.4 mg under the tongue every 5 (five) minutes as needed for chest pain.    Historical Provider, MD  potassium chloride SA (K-DUR,KLOR-CON) 20 MEQ tablet Take 1 tablet (20 mEq total) by mouth daily. Patient  not taking: Reported on 12/01/2015 07/20/13   [name] Prows, MD    Family History Family History  Problem Relation Age of Onset  . Heart disease Mother   . Stroke Father   . Diabetes Sister   . Diabetes Brother   . Anesthesia problems Neg Hx   . Hypotension Neg Hx   . Malignant hyperthermia Neg Hx   . Pseudochol deficiency Neg Hx     Social History Social History  Substance Use Topics  . Smoking status: Never Smoker  . Smokeless tobacco: Not on file  . Alcohol use No     Allergies   Penicillins   Review of Systems Review of Systems 10 Systems reviewed and are negative for acute change except as noted in the HPI.   Physical Exam Updated Vital Signs BP 150/61   Pulse 65   Temp 97.8 F (36.6 C) (Oral)   Resp 17   SpO2 100%  Physical Exam  Constitutional:  Patient is alert and nontoxic. She does not have respiratory distress at rest. She does have evidence of sequelae of CVA.  HENT:  Head: Normocephalic and atraumatic.  Right Ear: External ear normal.  Left Ear: External ear normal.  Mouth/Throat: Oropharynx is clear and moist.  Eyes:  Patient has some asymmetric extraocular movement and proptosis on the right.  Neck: Neck supple.  Cardiovascular: Normal rate and regular rhythm.   Heart sounds are distant. I do not appreciate gross rub murmur gallop.  Pulmonary/Chest: [place] normal.  Patient has occasional coarse breath sounds that clear with cough.  Abdominal: Soft. She exhibits no distension. There is no tenderness.  Musculoskeletal:  Patient has about 1-2+ pitting edema bilateral lower extremity is. Extremities have flexion contracture but no apparent wounds or cellulitis.  Neurological: She is alert.  Patient is alert. Her speech is difficult to understand but it is appropriate. She is giving appropriate times for her breakfast and her medications. She does follow commands to move her legs although her ability to do so is limited due to prior stroke. Similar  for her upper extremity she has some flexion contracture but will move the extremities around to accommodate examination  Skin: Skin is warm and dry.  Psychiatric: She has a normal mood and affect.     ED Treatments / Results  Labs (all labs ordered are listed, but only abnormal results are displayed) Labs Reviewed  COMPREHENSIVE METABOLIC PANEL - Abnormal; Notable for the following:       Result Value   Glucose, Bld 53 (*)    All other components within normal limits  TROPONIN I - Abnormal; Notable for the following:    Troponin I 0.12 (*)    All other components within normal limits  URINALYSIS, ROUTINE W REFLEX MICROSCOPIC (NOT AT [facility]) - Abnormal; Notable for the following:    Hgb urine dipstick TRACE (*)    Ketones, ur 15 (*)    All other components within normal limits  URINE MICROSCOPIC-ADD ON - Abnormal; Notable for the following:    Squamous Epithelial / LPF 0-5 (*)    Bacteria, UA FEW (*)    All other components within normal limits  CBC WITH DIFFERENTIAL/PLATELET  CBG MONITORING, ED  CBG MONITORING, ED    EKG  EKG Interpretation  Date/Time:  Saturday December 01 2015 18:41:09 EDT Ventricular Rate:  87 PR Interval:    QRS Duration: 109 QT Interval:  395 QTC Calculation: 476 R Axis:   45 Text Interpretation:  Sinus rhythm Ventricular premature complex Borderline prolonged PR interval Right atrial enlargement LVH with secondary repolarization abnormality ST depression, consider ischemia, diffuse lds agree. appears consistent with previous with rate related increase in ST depression Confirmed by Johnney Killian, MD, Jeannie Done [phone]) on 12/01/2015 6:49:17 PM       Radiology No results found.  Procedures Procedures (including critical care time)  Medications Ordered in ED Medications  aspirin chewable tablet 324 mg (324 mg Oral Given 12/01/15 1951)     Initial Impression / Assessment and Plan / ED Course  I have reviewed the triage vital signs and the nursing  notes.  Pertinent labs & imaging results that were available during my care of the patient were reviewed by me and considered in my medical decision making (see chart for details).  Clinical Course   Consult:[facility] hospital[facility] admit  Final Clinical Impressions(s) / ED Diagnoses   Final diagnoses:  Hypoglycemia  Hypothermia, initial encounter  Severe comorbid  illness   Patient is alert and appropriate. She has been with tolerate oral intake. Patient does have elevated troponin. This may represent ischemia during her period of profound hypoglycemia. This may have lasted for several hours. The patient was hypothermic on arrival. She will be admitted to the hospital for continued monitoring of blood sugar and troponin. New Prescriptions New Prescriptions   No medications on file     Charlesetta Shanks, MD 12/01/15 2016    Charlesetta Shanks, MD 12/01/15 2016

## 2015-12-01 NOTE — ED Notes (Signed)
CRITICAL VALUE ALERT  Critical value received:  Troponin 0.12  Date of notification:  12-01-15  Time of notification:  [id number]  Critical value read back:Yes.    Nurse who received alert:  Maryfrances Bunnell RN   MD notified (1st page):  Dr. Johnney Killian

## 2015-12-01 NOTE — ED Notes (Signed)
Delay in lab draw, exray in room 

## 2015-12-01 NOTE — ED Triage Notes (Signed)
To room via EMS.  DTR left for work @ 8:30am and pt was at baseline.  DTR came home and pt had AMS.  Fire checked BS 22, EMS gave D10 227ml, BS increased to 168.  Pt normal baseline is usually nonverbal, pt can speak in one word if she knows you, very hard to understand, can nod, can move left side, right side deficits d/t CVA.  Pt uses trapeze type apparatus to life self into wheelchair, gives self own meds.

## 2015-12-02 ENCOUNTER — Encounter (HOSPITAL_COMMUNITY): Payer: Self-pay | Admitting: *Deleted

## 2015-12-02 DIAGNOSIS — E162 Hypoglycemia, unspecified: Secondary | ICD-10-CM

## 2015-12-02 DIAGNOSIS — G9340 Encephalopathy, unspecified: Secondary | ICD-10-CM

## 2015-12-02 LAB — TROPONIN I
TROPONIN I: 0.11 ng/mL — AB (ref <0.03)
TROPONIN I: 0.11 ng/mL — AB (ref <0.03)

## 2015-12-02 LAB — LIPID PANEL
CHOL/HDL RATIO: 3.7 ratio
Cholesterol: 176 mg/dL (ref 0–200)
HDL: 47 mg/dL (ref >40)
LDL Cholesterol: 118 mg/dL — ABNORMAL HIGH (ref 0–99)
Triglycerides: 54 mg/dL (ref <150)
VLDL: 11 mg/dL (ref 0–40)

## 2015-12-02 LAB — GLUCOSE, CAPILLARY
GLUCOSE-CAPILLARY: 115 mg/dL — AB (ref 65–99)
GLUCOSE-CAPILLARY: 279 mg/dL — AB (ref 65–99)
Glucose-Capillary: 116 mg/dL — ABNORMAL HIGH (ref 65–99)
Glucose-Capillary: 150 mg/dL — ABNORMAL HIGH (ref 65–99)
Glucose-Capillary: 183 mg/dL — ABNORMAL HIGH (ref 65–99)
Glucose-Capillary: 159 mg/dL — ABNORMAL HIGH (ref 65–99)

## 2015-12-02 MED ORDER — FUROSEMIDE 40 MG PO TABS
40.0000 mg | ORAL_TABLET | Freq: Every day | ORAL | Status: DC
  Administered 2015-12-03 – 2015-12-04 (×2): 40 mg via ORAL
  Filled 2015-12-02 (×2): qty 1

## 2015-12-02 MED ORDER — LISINOPRIL 5 MG PO TABS
5.0000 mg | ORAL_TABLET | Freq: Every day | ORAL | Status: DC
  Administered 2015-12-02 – 2015-12-04 (×3): 5 mg via ORAL
  Filled 2015-12-02 (×3): qty 1

## 2015-12-02 MED ORDER — CLOPIDOGREL BISULFATE 75 MG PO TABS
75.0000 mg | ORAL_TABLET | Freq: Every day | ORAL | Status: DC
  Administered 2015-12-02 – 2015-12-04 (×3): 75 mg via ORAL
  Filled 2015-12-02 (×3): qty 1

## 2015-12-02 MED ORDER — OXYCODONE HCL 5 MG PO TABS: 5.0000 mg | ORAL_TABLET | ORAL | Status: DC | PRN

## 2015-12-02 MED ORDER — GABAPENTIN 100 MG PO CAPS
100.0000 mg | ORAL_CAPSULE | Freq: Three times a day (TID) | ORAL | Status: DC
  Administered 2015-12-02 – 2015-12-04 (×8): 100 mg via ORAL
  Filled 2015-12-02 (×9): qty 1

## 2015-12-02 MED ORDER — ASPIRIN 81 MG PO TBEC: 81.0000 mg | DELAYED_RELEASE_TABLET | Freq: Every day | ORAL | Status: DC

## 2015-12-02 MED ORDER — FUROSEMIDE 40 MG PO TABS: 40.0000 mg | ORAL_TABLET | Freq: Every day | ORAL | Status: DC

## 2015-12-02 MED ORDER — ESCITALOPRAM OXALATE 10 MG PO TABS
20.0000 mg | ORAL_TABLET | Freq: Every day | ORAL | Status: DC
  Administered 2015-12-02 – 2015-12-04 (×3): 20 mg via ORAL
  Filled 2015-12-02 (×3): qty 2

## 2015-12-02 MED ORDER — METOPROLOL TARTRATE 25 MG PO TABS
25.0000 mg | ORAL_TABLET | Freq: Two times a day (BID) | ORAL | Status: DC
  Administered 2015-12-02 – 2015-12-04 (×5): 25 mg via ORAL
  Filled 2015-12-02 (×5): qty 1

## 2015-12-02 MED ORDER — ROSUVASTATIN CALCIUM 10 MG PO TABS
40.0000 mg | ORAL_TABLET | Freq: Every day | ORAL | Status: DC
  Administered 2015-12-02 – 2015-12-04 (×3): 40 mg via ORAL
  Filled 2015-12-02 (×3): qty 4

## 2015-12-02 MED ORDER — ASPIRIN EC 81 MG PO TBEC
81.0000 mg | DELAYED_RELEASE_TABLET | Freq: Every day | ORAL | Status: DC
  Administered 2015-12-03 – 2015-12-04 (×2): 81 mg via ORAL
  Filled 2015-12-02 (×4): qty 1

## 2015-12-02 MED ORDER — ISOSORB DINITRATE-HYDRALAZINE 20-37.5 MG PO TABS
1.0000 | ORAL_TABLET | Freq: Three times a day (TID) | ORAL | Status: DC
  Administered 2015-12-02 – 2015-12-03 (×4): 1 via ORAL
  Filled 2015-12-02 (×5): qty 1

## 2015-12-02 NOTE — Progress Notes (Signed)
RN very concerned about pt swallowing. Pt will not allow staff to help with feedings, and she will not allow RN to crush medications. Pt is at an increased risk for aspiration. RN made pt and family aware. RN will continue to monitor closely at meal times

## 2015-12-02 NOTE — Progress Notes (Signed)
PROGRESS NOTE                                                                                                                                                                                                             Patient Demographics:    [name] [name], is a 75 y.o. female, DOB - 01-24-1941, [id number]  Admit date - 12/01/2015   Admitting Physician Ivor Costa, MD  Outpatient Primary MD for the patient is ROBERTS, Sharol Given, MD  LOS - 1    Chief Complaint  Patient presents with  . Hypoglycemia  . Altered Mental Status       Brief Narrative   76 y.o. 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  Hypoglycemia - Unclear etiology, unlikely Lantus dose is too high for her, as patient reports usually her CBG in the morning on the higher side, possibly related to higher Lantus dose given at home. - Continue with D10 at 50 mL per hour, currently she is resumed on  diet, if her speech she started to climb up, will decrease D10 fluids gradually until its discontinued.  Elevated troponins/history of CAD - She denies any chest pain or shortness of breath, troponin non-ACS pattern, 0.11> 0.11> 0.11 - Follow on 2-D echo - Continue with aspirin , statin, Crestor and metoprolol  Hypertension - Continue with home medication  Diabetes mellitus - Hold Lantus given hypoglycemia  on admission, last glycohemoglobin A1c is 7.8  Chronic combined systolic and diastolic CHF - 2-D echo on 01/28/49 showed EF 30-35% with grade 3 diastolic dysfunction - Evidence of volume overload, continue with home dose Lasix, beta blockers and lisinopril  Hyperlipidemia - Continue with statin  History of CVA - Continue with aspirin, Plavix and statin   Code Status : Full  Family Communication  : D/W patient  Disposition Plan  : Home when stable  Consults  :  None  Procedures  : None  DVT Prophylaxis  :  Lovenox   Lab Results  Component Value Date   PLT 159 12/01/2015    Antibiotics  :    Anti-infectives    None        Objective:   Vitals:   12/01/15 2143 12/02/15 0026 12/02/15 0605 12/02/15 0945  BP: (!) 159/64 137/67 (!) 117/55 136/72  Pulse: 71 72 67   Resp:      Temp: 98.4 F (36.9 C) 98.3 F (36.8 C) 98.8 F (37.1 C)   TempSrc: Oral Oral Oral   SpO2: 100% 100% 100%   Weight: 81.6 kg (180 lb)  81.6 kg (180 lb)     Wt Readings from Last 3 Encounters:  12/02/15 81.6 kg (180 lb)  01/28/14 78.8 kg (173 lb 11.2 oz)  07/20/13 87 kg (191 lb 12.8 oz)     Intake/Output Summary (Last 24 hours) at 12/02/15 1134 Last data filed at 12/02/15 1000  Gross per 24 hour  Intake           478.33 ml  Output                0 ml  Net           478.33 ml     Physical Exam  Awake Alert, Oriented X 3, Communicate through writing Supple Neck,No JVD. Symmetrical Chest wall movement, Good air movement bilaterally, CTAB RRR,No Gallops,Rubs or new Murmurs,  No Parasternal Heave +ve B.Sounds, Abd Soft, No tenderness,  No Cyanosis, Clubbing or edema, Patient with right hemiparesis    Data Review:    CBC  Recent Labs Lab 12/01/15 1716  WBC 5.7  HGB 13.1  HCT 41.8  PLT 159  MCV 88.9  MCH 27.9  MCHC 31.3  RDW 14.5  LYMPHSABS 0.9  MONOABS 0.3  EOSABS 0.0  BASOSABS 0.0    Chemistries   Recent Labs Lab 12/01/15 1716  NA 141  K 4.2  CL 110  CO2 22  GLUCOSE 53*  BUN 19  CREATININE 0.90  CALCIUM 9.8  AST 30  ALT 15  ALKPHOS 56  BILITOT 0.8   ------------------------------------------------------------------------------------------------------------------  Recent Labs  12/02/15 0150  CHOL 176  HDL 47  LDLCALC 118*  TRIG 54  CHOLHDL 3.7    Lab Results  Component Value Date   HGBA1C 7.8 (H) 01/28/2014   ------------------------------------------------------------------------------------------------------------------ No results for input(s): TSH, T4TOTAL, T3FREE, THYROIDAB in the last 72 hours.  Invalid input(s): FREET3 ------------------------------------------------------------------------------------------------------------------ No results for input(s): VITAMINB12, FOLATE, FERRITIN, TIBC, IRON, RETICCTPCT in the last 72 hours.  Coagulation profile No results for input(s): INR, PROTIME in the last 168 hours.  No results for input(s): DDIMER in the last 72 hours.  Cardiac Enzymes  Recent Labs Lab 12/01/15 2113 12/02/15 0150 12/02/15 0830  TROPONINI 0.11* 0.11* 0.11*   ------------------------------------------------------------------------------------------------------------------    Component Value Date/Time   BNP 359.5 (H) 12/01/2015 2113    Inpatient Medications  Scheduled Meds: . [START ON  12/03/2015] aspirin  81 mg Oral Daily  . clopidogrel  75 mg Oral Daily  . enoxaparin (LOVENOX) injection  40 mg Subcutaneous Q24H  . escitalopram  20 mg Oral Daily  . [START ON 12/03/2015] furosemide   40 mg Oral Daily  . gabapentin  100 mg Oral TID  . insulin aspart  0-9 Units Subcutaneous TID WC  . isosorbide-hydrALAZINE  1 tablet Oral TID  . lisinopril  5 mg Oral Daily  . metoprolol tartrate  25 mg Oral BID  . rosuvastatin  40 mg Oral Daily   Continuous Infusions: . dextrose 50 mL/hr (12/01/15 2326)   PRN Meds:.acetaminophen, ALPRAZolam, dextrose, hydrALAZINE, nitroGLYCERIN, ondansetron (ZOFRAN) IV, oxyCODONE  Micro Results No results found for this or any previous visit (from the past 240 hour(s)).  Radiology Reports Dg Chest Port 1 View  Result Date: 12/01/2015 CLINICAL DATA:  Altered mental status. History of CVA with right-sided deficit. EXAM: PORTABLE CHEST 1 VIEW COMPARISON:  10/03/2015 FINDINGS: Postsurgical changes from CABG. The cardiac silhouette is stably enlarged. Mediastinal contours appear intact. There is no evidence of focal airspace consolidation, pleural effusion or pneumothorax. Osseous structures are without acute abnormality. Soft tissues are grossly normal. IMPRESSION: Enlarged cardiac silhouette, otherwise no evidence of acute cardiopulmonary process. Electronically Signed   By: Fidela Salisbury M.D.   On: 12/01/2015 21:15     ELGERGAWY, DAWOOD M.D on 12/02/2015 at 11:34 AM  Between 7am to 7pm - Pager - [phone]  After 7pm go to [email or web address] - password [facility]  [facility] -  Office  [phone]

## 2015-12-02 NOTE — Evaluation (Addendum)
Physical Therapy Evaluation Patient Details Name: [name] [name] MRN: [id number] DOB: 12/10/1940 Today's Date: 12/02/2015   History of Present Illness  75 y.o. 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  Clinical Impression  Patient presents with decreased independence with mobility and high fall risk.  Not safe for home alone during the day and would benefit from STSNF level rehab.  Patient and daughter agreeable and prefer [facility] if available.  PT to follow during acute stay as well.      Follow Up Recommendations SNF;Supervision/Assistance - 24 hour    Equipment Recommendations  None recommended by PT    Recommendations for Other Services       Precautions / Restrictions Precautions Precautions: Fall      Mobility  Bed Mobility Overal bed mobility: Needs Assistance Bed Mobility: Rolling;Sidelying to Sit Rolling: Min assist Sidelying to sit: Min assist       General bed mobility comments: useing rail with L UE for mobility, increased time to see if pt able to get up unaided with rail, but assist provided when unable to get fully upright pushing with L arm only  Transfers Overall transfer level: Needs assistance   Transfers: Stand Pivot Transfers;Sit to/from Stand Sit to Stand: From elevated surface;Min assist;Mod assist Stand pivot transfers: Min assist;From elevated surface       General transfer comment: stood to pivot to chair with posterior bias minguard for safety trying to let pt be as independent as possible, but so posterior and risk of pushing chair back assisted hips rest of way over; stood from low seat with assist pt pulling  up first on bedrail, then up to sink, then without device using PT for HHA  Ambulation/Gait Ambulation/Gait assistance: Mod assist Ambulation Distance (Feet): 4 Feet Assistive device: 1 person hand held assist Gait Pattern/deviations: Step-to pattern;Decreased dorsiflexion - left;Wide base of support;Shuffle;Trunk flexed;Leaning posteriorly     General Gait Details: assist short distance in room with pt able to support self and progress legs, but fearful and leaning posteriorly and without shoes risk of posterior LOB  Stairs            Wheelchair Mobility    Modified Rankin (Stroke Patients Only) Modified Rankin (Stroke Patients Only) Pre-Morbid Rankin Score: Moderately severe disability Modified Rankin: Moderately severe disability     Balance Overall balance assessment: Needs assistance   Sitting balance-Leahy Scale: Good     Standing balance support: Single extremity supported Standing balance-Leahy Scale: Poor Standing balance comment: standing with L hand on rail or sink with min A to mod A cues for anterior weight shift, then brushing teeth at sink without UE support mod A for balance and cues/facilitation for anterior weight shift                             Pertinent Vitals/Pain Faces Pain Scale: No hurt    Home Living Family/patient expects to be discharged to:: Private residence Living Arrangements: Children Available Help at Discharge: Family;Available PRN/intermittently;Personal care attendant   Home Access: Level entry     Home Layout: One level Home Equipment: Walker - 2 wheels;Wheelchair - power;Wheelchair - manual;Hospital bed;Shower seat;Cane - quad;Grab bars - toilet  Prior Function Level of Independence: Needs assistance   Gait / Transfers Assistance Needed: reports was transferring to w/c on her own and going to bathroom   ADL's / Homemaking Assistance Needed: states dresses herself, aide helps 2 hours/day        Hand  Dominance   Dominant Hand: Left    Extremity/Trunk Assessment   Upper Extremity Assessment: RUE deficits/detail RUE Deficits / Details: hands in gross grasp and wrist in flexion and elbow flexion, able to range out of flexion with wrist in neutral and hand with fingers extended. 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       Communication   Communication: Expressive difficulties (aphasia)  Cognition Arousal/Alertness: Awake/alert Behavior During Therapy: WFL for tasks assessed/performed Overall Cognitive Status: Within Functional Limits for tasks assessed                      General Comments      Exercises     Assessment/Plan    PT Assessment Patient needs continued PT services  PT Problem List Decreased balance;Decreased activity tolerance;Decreased mobility;Decreased safety awareness          PT Treatment Interventions DME instruction;Therapeutic exercise;Balance training;Functional mobility training;Therapeutic activities;Patient/family education;Wheelchair mobility training    PT Goals (Current goals can be found in the Care Plan section)  Acute Rehab PT Goals Patient Stated Goal: To work on walking and balance PT Goal Formulation: With patient Time For Goal Achievement: 12/07/15 Potential to Achieve Goals: Good    Frequency Min 3X/week   Barriers to discharge Decreased caregiver support grandson works 9-6, has aide 2 hours in morning.    Co-evaluation               End of Session Equipment Utilized During Treatment: Gait belt Activity Tolerance: Patient tolerated treatment well;Patient limited by fatigue Patient left: in bed;with call bell/phone  within reach Nurse Communication: Mobility status         Time: [id number] PT Time Calculation (min) (ACUTE ONLY): 41 min   Charges:   PT Evaluation $PT Eval Moderate Complexity: 1 Procedure PT Treatments $Therapeutic Activity: 23-37 mins   PT G CodesReginia Naas 2015/12/12, 4:50 PM  Magda Kiel, [place] 12/12/2015

## 2015-12-02 NOTE — Progress Notes (Signed)
Rn talked to daughter. Daughter states pt draws up her own insulin, and administers it herself. Daughter also states that pt is independent when it comes to taking medications

## 2015-12-02 NOTE — Evaluation (Signed)
Clinical/Bedside Swallow Evaluation Patient Details  Name: [name] [name] MRN: [id number] Date of Birth: 1940-10-20  Today's Date: 12/02/2015 Time: SLP Start Time (ACUTE ONLY): 1046 SLP Stop Time (ACUTE ONLY): 1118 SLP Time Calculation (min) (ACUTE ONLY): 32 min  Past Medical History:  Past Medical History:  Diagnosis Date  . Angina   . CHF (congestive heart failure) ([place])   . Complication of anesthesia    lung   . Diabetes mellitus ([place])   . GERD (gastroesophageal reflux disease)   . H/O hiatal hernia   . Hypercholesteremia   . Hypertension   . Myocardial infarction    CABG 2001, ? stent. Cath 02/2011 with new distal LIMA-LAD 80%. SVG-OM occlusion is old, rest of grafts patent  . Peripheral vascular disease ([place])   . Stroke [facility]) 2002 and 2003   Resultant right hemiparesis and aphasia. Communicates by writing    Past Surgical History:  Past Surgical History:  Procedure Laterality Date  . ABDOMINAL HYSTERECTOMY    . BREAST BIOPSY    . CARDIAC CATHETERIZATION  02/2011  . CORONARY ARTERY BYPASS GRAFT    . ESOPHAGOGASTRODUODENOSCOPY  03/12/2011   Procedure: ESOPHAGOGASTRODUODENOSCOPY (EGD);  Surgeon: [name], MD;  Location: [facility] ENDOSCOPY;  Service: Endoscopy;  Laterality: N/A;  . LEFT HEART CATHETERIZATION WITH CORONARY ANGIOGRAM N/A 03/10/2011   Procedure: LEFT HEART CATHETERIZATION WITH CORONARY ANGIOGRAM;  Surgeon: [name], MD;  Location: [facility] CATH LAB;  Service: Cardiovascular;  Laterality: N/A;   HPI:  75 yo female admitted for AMS secondary to hypoglycemia. She has a PMH of CVA which has left has severely dysarthric, using writing to communicate. She also has residual dysphagia due in large part to reduced oral control. She has had multiple swallowing assessments in the past, with most recent recommending regular diet and thin liquids. Pt in the past has declined PEG and repeat MBS, preferring to eat/drink what she would like as she uses compensatory strategies to  mitigate aspiration risk.   Assessment / Plan / Recommendation Clinical Impression  Pt has a moderate-severe oral dysphagia that appears consistent with prior swallowing evaluations. She has baseline right-sided weakness that results in incomplete labial seal, allowing solids and liquids (including saliva) to spill anteriorly from her mouth. She pushes her tongue anteriorly to try to compensate. She places food in the left side of her mouth to allow for more effective mastication, although she still needs to use a posterior head tilt and liquid wash to aid in clearance. Immediate coughing is noted with sips of thin liquid and with use of thin liquid to clear solids, but when she is in a very upright position and uses a chin tuck with Min cues, coughing is significantly reduced. CXR upon SLP arrival does not have signs of acute infection. Pt continues to say that she is not interested in alternative means of nutrition, but she is interested in pursuing MBS to assess current swallowing function. Will allow for regular diet and thin liquids with use of chin tuck until MBS can be completed.     Aspiration Risk  Moderate aspiration risk    Diet Recommendation Regular;Thin liquid   Liquid Administration via: Cup Medication Administration: Whole meds with puree Supervision: Patient able to self feed;Full supervision/cueing for compensatory strategies Compensations: Slow rate;Small sips/bites;Chin tuck;Other (Comment) (may wish to use pureed wash to clear solids) Postural Changes: Seated upright at 90 degrees;Remain upright for at least 30 minutes after po intake    Other  Recommendations Oral Care Recommendations:  Oral care BID;Oral care before and after PO Other Recommendations: Have oral suction available   Follow up Recommendations        Frequency and Duration            Prognosis        Swallow Study   General HPI: 75 yo female admitted for AMS secondary to hypoglycemia. She has a PMH  of CVA which has left has severely dysarthric, using writing to communicate. She also has residual dysphagia due in large part to reduced oral control. She has had multiple swallowing assessments in the past, with most recent recommending regular diet and thin liquids. Pt in the past has declined PEG and repeat MBS, preferring to eat/drink what she would like as she uses compensatory strategies to mitigate aspiration risk. Type of Study: Bedside Swallow Evaluation Previous Swallow Assessment: see HPI Diet Prior to this Study: NPO Temperature Spikes Noted: No Respiratory Status: Room air History of Recent Intubation: No Behavior/Cognition: Alert;Cooperative;Pleasant mood;Other (Comment) (communicates mostly via writing) Oral Cavity Assessment: Excessive secretions Oral Care Completed by SLP: No Vision: Functional for self-feeding Self-Feeding Abilities: Able to feed self Patient Positioning: Upright in bed Baseline Vocal Quality: Wet (improved with cued cough) Volitional Cough: Weak    Oral/Motor/Sensory Function Overall Oral Motor/Sensory Function: Other (comment) (baseline right-sided impairment)   Ice Chips Ice chips: Not tested   Thin Liquid Thin Liquid: Impaired Presentation: Cup;Self Fed Oral Phase Impairments: Reduced labial seal;Reduced lingual movement/coordination Oral Phase Functional Implications: Right anterior spillage;Prolonged oral transit Pharyngeal  Phase Impairments: Suspected delayed Swallow;Cough - Immediate    Nectar Thick Nectar Thick Liquid: Not tested   Honey Thick Honey Thick Liquid: Not tested   Puree Puree: Impaired Presentation: Spoon;Self Fed Oral Phase Impairments: Reduced labial seal;Reduced lingual movement/coordination Oral Phase Functional Implications: Right anterior spillage;Prolonged oral transit Pharyngeal Phase Impairments: Suspected delayed Swallow   Solid   GO   Solid: Impaired Presentation: Self Fed Oral Phase Impairments: Reduced labial  seal;Reduced lingual movement/coordination;Impaired mastication Oral Phase Functional Implications: Right anterior spillage;Right lateral sulci pocketing;Prolonged oral transit;Oral residue Pharyngeal Phase Impairments: Suspected delayed Swallow;Cough - Immediate (cough noted when clearing solids with liquid wash)        Germain Osgood 12/02/2015,11:42 AM  Germain Osgood, M.A. CCC-SLP [phone]

## 2015-12-03 ENCOUNTER — Inpatient Hospital Stay (HOSPITAL_COMMUNITY): Payer: Medicare HMO

## 2015-12-03 DIAGNOSIS — E11649 Type 2 diabetes mellitus with hypoglycemia without coma: Principal | ICD-10-CM

## 2015-12-03 DIAGNOSIS — Z794 Long term (current) use of insulin: Secondary | ICD-10-CM

## 2015-12-03 LAB — BASIC METABOLIC PANEL
ANION GAP: 6 (ref 5–15)
BUN: 15 mg/dL (ref 6–20)
CALCIUM: 8.7 mg/dL — AB (ref 8.9–10.3)
CO2: 22 mmol/L (ref 22–32)
Chloride: 111 mmol/L (ref 101–111)
Creatinine, Ser: 0.98 mg/dL (ref 0.44–1.00)
GFR, EST NON AFRICAN AMERICAN: 55 mL/min — AB (ref >60)
Glucose, Bld: 140 mg/dL — ABNORMAL HIGH (ref 65–99)
Potassium: 4 mmol/L (ref 3.5–5.1)
Sodium: 139 mmol/L (ref 135–145)

## 2015-12-03 LAB — GLUCOSE, CAPILLARY
GLUCOSE-CAPILLARY: 156 mg/dL — AB (ref 65–99)
GLUCOSE-CAPILLARY: 159 mg/dL — AB (ref 65–99)
GLUCOSE-CAPILLARY: 161 mg/dL — AB (ref 65–99)
GLUCOSE-CAPILLARY: 163 mg/dL — AB (ref 65–99)
Glucose-Capillary: 194 mg/dL — ABNORMAL HIGH (ref 65–99)
Glucose-Capillary: 209 mg/dL — ABNORMAL HIGH (ref 65–99)
Glucose-Capillary: 269 mg/dL — ABNORMAL HIGH (ref 65–99)

## 2015-12-03 LAB — HEMOGLOBIN A1C
Hgb A1c MFr Bld: 7.3 % — ABNORMAL HIGH (ref 4.8–5.6)
MEAN PLASMA GLUCOSE: 163 mg/dL

## 2015-12-03 LAB — CBC
HCT: 32.6 % — ABNORMAL LOW (ref 36.0–46.0)
Hemoglobin: 10 g/dL — ABNORMAL LOW (ref 12.0–15.0)
MCH: 27 pg (ref 26.0–34.0)
MCHC: 30.7 g/dL (ref 30.0–36.0)
MCV: 88.1 fL (ref 78.0–100.0)
PLATELETS: 129 10*3/uL — AB (ref 150–400)
RBC: 3.7 MIL/uL — AB (ref 3.87–5.11)
RDW: 15 % (ref 11.5–15.5)
WBC: 5.7 10*3/uL (ref 4.0–10.5)

## 2015-12-03 MED ORDER — INSULIN GLARGINE 100 UNIT/ML ~~LOC~~ SOLN
10.0000 [IU] | Freq: Every day | SUBCUTANEOUS | Status: DC
  Administered 2015-12-03: 10 [IU] via SUBCUTANEOUS
  Filled 2015-12-03 (×2): qty 0.1

## 2015-12-03 NOTE — Progress Notes (Signed)
PROGRESS NOTE                                                                                                                                                                                                             Patient Demographics:    [name] [name], is a 75 y.o. female, DOB - November 10, 1940, [id number]  Admit date - 12/01/2015   Admitting Physician Ivor Costa, MD  Outpatient Primary MD for the patient is ROBERTS, Sharol Given, MD  LOS - 2    Chief Complaint  Patient presents with  . Hypoglycemia  . Altered Mental Status       Brief Narrative   75 y.o. 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  Hypoglycemia - Unclear etiology, Unclear Lantus dose is too high for her, but this seems to be less likely given she reports her CBG in a.m. in the 200s range  , as well possibly related to age and giving herself higher at this dose at home. - Usually on D10  W, CBG started to increase yesterday, patient tolerating diet, so D10 was stopped yesterday, will start on 10 units subcutaneous Lantus daily and adjust dose as needed.  higher Lantus dose given at home.   Elevated troponins/history of CAD - She denies any chest pain or shortness of breath, troponin non-ACS pattern, 0.11> 0.11> 0.11 - Follow on 2-D echo - Continue  with aspirin , statin, and metoprolol  Hypertension - Patient resumed her home medication, blood pressure is soft this a.m., will continue with lisinopril and metoprolol, and old bilateral  Diabetes mellitus - Hemoglobin A1c is 7.8 last year, it was 7.3 this admission , surface of her OCCIPITAL control, so very likely patient will need a lower dose Lantus on discharge, started on 2 units Lantus daily, as well continue with insulin sliding scale .  Chronic combined systolic and diastolic CHF - 2-D echo on 01/28/49 showed EF 30-35% with grade 3 diastolic dysfunction - No Evidence of volume overload, continue with home dose Lasix, beta blockers and lisinopril  Hyperlipidemia - Continue with statin  History of CVA - Continue with aspirin, Plavix and statin   Code Status : Full  Family Communication  : D/W patient  Disposition Plan  : We'll need SNF placement, likely in 24 hours  Consults  :  None  Procedures  : None  DVT Prophylaxis  :  Lovenox   Lab Results  Component Value Date   PLT 129 (L) 12/03/2015    Antibiotics  :    Anti-infectives    None        Objective:   Vitals:   12/03/15 0001 12/03/15 0527 12/03/15 0739 12/03/15 0849  BP: (!) 98/45 (!) 107/41  113/87  Pulse: (!) 58 (!) 59  65  Resp: 14 15    Temp: 97.8 F (36.6 C) 97.9 F (36.6 C) 98.2 F (36.8 C)   TempSrc: Oral Oral Oral   SpO2: 96% 95%  99%  Weight:  82.2 kg (181 lb 4.8 oz)      Wt Readings from Last 3 Encounters:  12/03/15 82.2 kg (181 lb 4.8 oz)  01/28/14 78.8 kg (173 lb 11.2 oz)  07/20/13 87 kg (191 lb 12.8 oz)      Intake/Output Summary (Last 24 hours) at 12/03/15 1009 Last data filed at 12/03/15 0859  Gross per 24 hour  Intake             1260 ml  Output                0 ml  Net             1260 ml     Physical Exam  Awake Alert, Oriented X 3, Communicate through writing Supple Neck,No JVD. Symmetrical Chest wall movement, Good air movement bilaterally, CTAB RRR,No Gallops,Rubs or new Murmurs, No Parasternal Heave +ve B.Sounds, Abd Soft, No tenderness,  No Cyanosis, Clubbing or edema, Patient with right hemiparesis    Data Review:    CBC  Recent Labs Lab 12/01/15 1716 12/03/15 0437  WBC 5.7 5.7  HGB 13.1 10.0*  HCT 41.8 32.6*  PLT 159 129*  MCV 88.9 88.1  MCH 27.9 27.0  MCHC 31.3 30.7  RDW 14.5 15.0  LYMPHSABS 0.9  --   MONOABS 0.3  --   EOSABS 0.0  --   BASOSABS 0.0  --     Chemistries   Recent Labs Lab 12/01/15 1716 12/03/15 0437  NA 141 139  K 4.2 4.0  CL 110 111  CO2 22 22  GLUCOSE 53* 140*  BUN 19 15  CREATININE 0.90 0.98  CALCIUM 9.8 8.7*  AST 30  --   ALT 15  --   ALKPHOS 56  --   BILITOT 0.8  --    ------------------------------------------------------------------------------------------------------------------  Recent Labs  12/02/15 0150  CHOL 176  HDL 47  LDLCALC 118*  TRIG 54  CHOLHDL 3.7    Lab Results  Component Value Date   HGBA1C 7.3 (H) 12/02/2015   ------------------------------------------------------------------------------------------------------------------ No results for input(s): TSH, T4TOTAL, T3FREE, THYROIDAB in the last 72 hours.  Invalid input(s): FREET3 ------------------------------------------------------------------------------------------------------------------ No results for input(s): VITAMINB12, FOLATE, FERRITIN, TIBC, IRON, RETICCTPCT in the last 72 hours.  Coagulation profile No results for input(s): INR, PROTIME in the last 168 hours.  No results for input(s): DDIMER in the last 72  hours.  Cardiac Enzymes  Recent Labs Lab 12/01/15 2113 12/02/15 0150 12/02/15 0830  TROPONINI 0.11* 0.11* 0.11*   ------------------------------------------------------------------------------------------------------------------    Component Value Date/Time   BNP 359.5 (H) 12/01/2015 2113    Inpatient Medications  Scheduled Meds: . aspirin EC  81 mg Oral Daily  . clopidogrel  75 mg Oral Daily  . enoxaparin (LOVENOX) injection  40 mg Subcutaneous Q24H  . escitalopram  20 mg Oral Daily  . furosemide  40 mg Oral Daily  . gabapentin  100 mg Oral TID  . insulin aspart  0-9 Units Subcutaneous TID WC  . insulin glargine  10 Units Subcutaneous Daily  . isosorbide-hydrALAZINE  1 tablet Oral TID  . lisinopril  5 mg Oral Daily  . metoprolol tartrate  25 mg Oral BID  . rosuvastatin  40 mg Oral Daily   Continuous Infusions: . dextrose Stopped (12/02/15 1200)   PRN Meds:.acetaminophen, ALPRAZolam, dextrose, hydrALAZINE, nitroGLYCERIN, ondansetron (ZOFRAN) IV, oxyCODONE  Micro Results No results found for this or any previous visit (from the past 240 hour(s)).  Radiology Reports Dg Chest Port 1 View  Result Date: 12/01/2015 CLINICAL DATA:  Altered mental status. History of CVA with right-sided deficit. EXAM: PORTABLE CHEST 1 VIEW COMPARISON:  10/03/2015 FINDINGS: Postsurgical changes from CABG. The cardiac silhouette is stably enlarged. Mediastinal contours appear intact. There is no evidence of focal airspace consolidation, pleural effusion or pneumothorax. Osseous structures are without acute abnormality. Soft tissues are grossly normal. IMPRESSION: Enlarged cardiac silhouette, otherwise no evidence of acute cardiopulmonary process. Electronically Signed   By: Fidela Salisbury M.D.   On: 12/01/2015 21:15     Keely Drennan M.D on 12/03/2015 at 10:09 AM  Between 7am to 7pm - Pager - [phone]  After 7pm go to [email or web address] - password [facility]  [facility] -  Office   [phone]

## 2015-12-03 NOTE — Clinical Social Work Placement (Signed)
   CLINICAL SOCIAL WORK PLACEMENT  NOTE  Date:  12/03/2015  Patient Details  Name: [name] [name] MRN: [id number] Date of Birth: 09-05-1940  Clinical Social Work is seeking post-discharge placement for this patient at the [place] level of care (*CSW will initial, date and re-position this form in  chart as items are completed):  Yes   Patient/family provided with [place] Work Department's list of facilities offering this level of care within the geographic area requested by the patient (or if unable, by the patient's family).  Yes   Patient/family informed of their freedom to choose among providers that offer the needed level of care, that participate in Medicare, Medicaid or managed care program needed by the patient, have an available bed and are willing to accept the patient.  Yes   Patient/family informed of [facility]'s ownership interest in [facility] and [facility], as well as of the fact that they are under no obligation to receive care at these facilities.  PASRR submitted to EDS on       PASRR number received on       Existing PASRR number confirmed on 12/03/15     FL2 transmitted to all facilities in geographic area requested by pt/family on 12/03/15     FL2 transmitted to all facilities within larger geographic area on       Patient informed that his/her managed care company has contracts with or will negotiate with certain facilities, including the following:        Yes   Patient/family informed of bed offers received.  Patient chooses bed at [facility]     Physician recommends and patient chooses bed at      Patient to be transferred to [facility] on 12/04/15.  Patient to be transferred to facility by       Patient family notified on   of transfer.  Name of family member notified:        PHYSICIAN Please prepare priority discharge summary, including medications, Please sign FL2      Additional Comment:   Barbette Or, [place]

## 2015-12-03 NOTE — Progress Notes (Signed)
SLP  Note  Patient Details Name: [name] [name] MRN: [id number] DOB: December 15, 1940   MBS was completed this date.  Please see complete results in imaging.  Recommend allowing the patient to order what food she likes but to consider pureed items for energy conservation and to increase intake.  The patient can have thin liquids but MUST tuck her chin.  Use of a straw facilitated use of a proper chin tuck in a timely manner.  Thank you.          Shelly Flatten, MA, [facility] SLP [phone] [name] 12/03/2015, 11:47 AM

## 2015-12-03 NOTE — Clinical Social Work Note (Signed)
Clinical Social Work Assessment  Patient Details  Name: [name] [name] MRN: [id number] Date of Birth: 1940-12-06  Date of referral:  12/03/15               Reason for consult:  Facility Placement                Permission sought to share information with:  Family Supports Permission granted to share information::  Yes, Verbal Permission Granted  Name::     Anson [place]  Relationship::  Daughter  Contact Information:  [phone]  Housing/Transportation Living arrangements for the past 2 months:  [place] of Information:  Patient Patient Interpreter Needed:  None Criminal Activity/Legal Involvement Pertinent to Current Situation/Hospitalization:  No - Comment as needed Significant Relationships:  Adult Children, Other Family Members Lives with:  Other (Comment) (Granddaughter) Do you feel safe going back to the place where you live?  Yes Need for family participation in patient care:  Yes (Comment)  Care giving concerns:  Patient daughter expressed her desire for patient to return to [facility] as she is familiar with the facility.   Social Worker assessment / plan:  Holiday representative met with patient at bedside to offer support and discuss patient needs at discharge.  Patient is nonverbal, however does write and is completely alert and oriented.  Patient states that she has been to [facility] in the past and would like to return at discharge.  Patient requests that her daughter [name] be contacted to confirm discharge plans.  CSW spoke with patient daughter who is in agreement with [facility] and able to complete paperwork when needed.  CSW completed paperwork and initiated referral.  [facility] extended a bed offer and patient daughter updated.  Patient daughter to go to facility at 13:30 tomorrow to complete paperwork.  CSW submitted paperwork for insurance authorization and awaiting approval.  Per MD, patient plans to discharge tomorrow.  CSW to follow up with  patient and daughter regarding discharge once insurance authorization received.  CSW available to facilitate patient discharge once medically stable.   Employment status:  Retired Nurse, adult PT Recommendations:  [place] / Referral to community resources:  [place]  Patient/Family's Response to care:  Patient and patient daughter verbalized understanding of CSW role and appreciative of support.  Patient and daughter agreeable to placement at [facility] once medically stable.  Patient/Family's Understanding of and Emotional Response to Diagnosis, Current Treatment, and Prognosis:  Patient and patient family aware of patient limitations and need for placement.  Patient lives at home with her granddaughter and hopeful to return following ST-SNF stay.    Emotional Assessment Appearance:  Appears older than stated age Attitude/Demeanor/Rapport:  Unable to Assess (Patient only communicates through writing) Affect (typically observed):  Unable to Assess (Patient only communicates through writing) Orientation:  Oriented to Self, Oriented to Place, Oriented to  Time, Oriented to Situation Alcohol / Substance use:  Not Applicable Psych involvement (Current and /or in the community):  No (Comment)  Discharge Needs  Concerns to be addressed:  Discharge Planning Concerns Readmission within the last 30 days:  No Current discharge risk:  Dependent with Mobility Barriers to Discharge:  Continued Medical Work up  [employer], [place]

## 2015-12-03 NOTE — Plan of Care (Signed)
Problem: Physical Regulation: Goal: Ability to maintain clinical measurements within normal limits will improve Outcome: Progressing The client appears to be back to her baseline. PT is recommending the client go to a 24 supervision skilled nursing facility.

## 2015-12-03 NOTE — Progress Notes (Signed)
Inpatient Diabetes Program Recommendations  AACE/ADA: New Consensus Statement on Inpatient Glycemic Control (2015)  Target Ranges:  Prepandial:   less than 140 mg/dL      Peak postprandial:   less than 180 mg/dL (1-2 hours)      Critically ill patients:  140 - 180 mg/dL   Lab Results  Component Value Date   GLUCAP 269 (H) 12/03/2015   HGBA1C 7.3 (H) 12/02/2015   Review of Glycemic Control  Diabetes history: DM 2 Diagnosed 1989 Outpatient Diabetes medications: Lantus 34 units QPM, Novolog 11 units QAM (patient only takes once) Current orders for Inpatient glycemic control: Lantus 10 units, Novolog Sensitive TID  Inpatient Diabetes Program Recommendations:   Spoke with patient about diabetes and home regimen for diabetes control. Patient reports that she is followed by her PCP Dr. Mancel Bale for diabetes management. Patient reports that she is not taking insulin as prescribed and that she last saw her PCP Tuesday last week. Patient reports that no changes were made with the insulin at the last office visit. Patient states that she checks her glucose twice a day and that her glucose is usually in the 100-400's mg/dl in the morning. She mentions having hypoglycemia often and has to call EMS 4 times for hypoglycemia within the last 10 days alone. Even though A1c looks good, patient's high glucose ranges is equalling out her hypoglycemia episodes. Patient reports loosing 8 pounds recently and not eating as much. Patient also reports her hypoglycemia episodes are sometimes in the morning and around 2-4 pm. Inquired about prior A1C and patient reports that it was a little over 7%. Spoke with patient about being on too much insulin at home. Patient is wanting her glucose levels to be controlled here and for her to match her regimen here for home. Discussed with patient about telling her PCP about her high and low glucose readings and telling him that she is not taking her insulin as prescribed. Patient  verbalized understanding of information discussed and has no further questions at this time related to diabetes.  Consider adding Novolog 3-5 units TID meal coverage while inpatient. Glucose increased from 161 to 269 mg/dl at lunch.   Thanks,  Tama Headings RN, MSN, [facility] Inpatient Diabetes Coordinator Team Pager [phone] (8a-5p)

## 2015-12-03 NOTE — Evaluation (Signed)
Occupational Therapy Evaluation Patient Details Name: [name] [name] MRN: [id number] DOB: [birth year] Today's Date: 12/03/2015    History of Present Illness 75 y.o. 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   Clinical Impression   This 75 yo female admitted with above presents to acute OT with deficits below (see OT problem list) thus affecting her PLOF of being able to transfer to toilet by herself and manage her clothing/hygiene and A with some of her basic ADLs. She will benefit from acute OT with follow up OT at SNF to work on getting back to PLOF.  Follow Up Recommendations  SNF    Equipment Recommendations  Other (comment) (TBD at next venue)       Precautions / Restrictions Precautions Precautions: Fall Restrictions Weight Bearing Restrictions: No      Mobility Bed Mobility Overal bed mobility: Needs Assistance Bed Mobility: Supine to Sit     Supine to sit: Min assist     General bed mobility comments: useing rail with L UE for mobility  Transfers Overall transfer level: Needs assistance Equipment used: 1 person hand held assist Transfers: Sit to/from Stand;Stand Pivot Transfers Sit to Stand: Max assist Stand pivot transfers: +2 physical assistance;Max assist       General transfer comment: posterior lean bias    Balance Overall balance assessment: Needs assistance Sitting-balance support: No upper extremity supported;Feet supported Sitting balance-Leahy Scale: Good     Standing balance support: Single extremity supported Standing balance-Leahy Scale: Poor Standing balance comment: posterior bias                             ADL Overall ADL's : Needs assistance/impaired Eating/Feeding: Set up;Bed level   Grooming: Moderate assistance;Sitting   Upper Body Bathing: Minimal assitance;Sitting   Lower Body Bathing: Maximal assistance (+1 max A sit<>stand)   Upper Body Dressing : Maximal assistance;Sitting   Lower Body Dressing: Total assistance (+1 max A sit<>stand)   Toilet Transfer: +2 for physical assistance;Maximal assistance;Stand-pivot Toilet Transfer Details (indicate cue type and reason): bed>recliner going to pt's right Toileting- Clothing Manipulation and Hygiene: Total assistance (+1 max A sit<>stand)                         Pertinent Vitals/Pain Pain Assessment: No/denies pain     Hand Dominance Left   Extremity/Trunk Assessment Upper Extremity Assessment RUE Deficits / Details: hands in gross grasp and wrist in flexion and elbow flexion, able to range out of flexion with wrist in neutral and hand with fingers extended. Shoulder flexion to about 100 degrees PROM, can minimaly raise her right arm in to abduction to wash under it and apply deodorant. Pt reports she use to have a splint for right hand, but does not wear it anymore RUE Coordination: decreased fine motor;decreased gross motor           Communication  She communicates with head nods, writing, and sometimes you can understand what she is trying to tell you if you know the context.   Cognition Arousal/Alertness: Awake/alert Behavior During Therapy: WFL for tasks assessed/performed Overall Cognitive Status: Within Functional Limits for tasks assessed  Home Living Family/patient expects to be discharged to:: Skilled nursing facility   Available Help at Discharge: Family;Available PRN/intermittently;Personal care attendant                                    Prior Functioning/Environment        Communication / Swallowing Assistance Needed:  expressive aphasia, communicates with writing or head shakes (yes/no) and can at times make out what she is trying to say if you know the context          OT Problem List: Decreased strength;Decreased range of motion;Impaired balance (sitting and/or standing);Impaired tone;Obesity;Impaired UE functional use   OT Treatment/Interventions: Self-care/ADL training;Therapeutic activities;Therapeutic exercise;Neuromuscular education;DME and/or AE instruction;Patient/family education;Balance training    OT Goals(Current goals can be found in the care plan section) Acute Rehab OT Goals Patient Stated Goal: shook head to wanting to work on getting up out of bed OT Goal Formulation: With patient Time For Goal Achievement: 12/10/15 Potential to Achieve Goals: Good  OT Frequency: Min 2X/week   Barriers to D/C: Decreased caregiver support             End of Session Equipment Utilized During Treatment: Gait belt Nurse Communication:  (NT: pt needs maxi move for transfers)  Activity Tolerance: Patient tolerated treatment well Patient left: in bed (getting ready for transport to testing)   Time: [id number] OT Time Calculation (min): 35 min Charges:  OT General Charges $OT Visit: 1 Procedure OT Evaluation $OT Eval Moderate Complexity: 1 Procedure OT Treatments $Self Care/Home Management : 8-22 mins  Almon Register [id number] 12/03/2015, 10:25 AM

## 2015-12-03 NOTE — NC FL2 (Signed)
[place] MEDICAID FL2 LEVEL OF CARE SCREENING TOOL     IDENTIFICATION  Patient Name: [name] [name] Birthdate: 06-Sep-1940 Sex: female Admission Date (Current Location): 12/01/2015  [facility] and [place] Number:  Herbalist and Address:  [facility], [place] [address] [place], [place] [address]      Provider Number: [id number]  Attending Physician Name and Address:  Albertine Patricia, MD  Relative Name and Phone Number:       Current Level of Care: Hospital Recommended Level of Care: [place] Prior Approval Number:    Date Approved/Denied:   PASRR Number:   [id number]  Discharge Plan: SNF    Current Diagnoses: Patient Active Problem List   Diagnosis Date Noted  . Chronic combined systolic and diastolic CHF (congestive heart failure) ([place]) 12/01/2015  . Acute encephalopathy 12/01/2015  . Hypoglycemia 12/01/2015  . Elevated troponin 12/01/2015  . Diabetes mellitus without complication ([place])   . Hypothermia   . Rhonchi   . Acute respiratory failure ([place]) 01/28/2014  . Respiratory distress 01/27/2014  . Hypoxia 01/27/2014  . CHF exacerbation ([place]) 01/27/2014  . Acute on chronic systolic CHF (congestive heart failure) ([place]) 01/27/2014  . Systolic CHF, acute on chronic ([place]) 01/27/2014  . Increased anion gap metabolic acidosis [id number]  . Right foot ulcer ([place]) 01/27/2014  . Aspiration pneumonia ([place]) 06/23/2012  . Dysphagia 06/21/2012  . Palliative care encounter 06/20/2012  . Healthcare-associated pneumonia 06/16/2012  . TIA (transient ischemic attack) 05/05/2012  . N&V (nausea and vomiting) 05/04/2012  . Weakness generalized 05/04/2012  . 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 NSTEMI (non-ST elevated myocardial infarction) ([place]) 07/27/2011  . Diabetes mellitus ([place])   . Chest pain 03/10/2011  . HTN (hypertension) 03/10/2011  . Cerebral  infarction ([place]) 03/10/2011  . CAD (coronary artery disease), native coronary artery 03/10/2011  . 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 S/P PTCA (percutaneous transluminal coronary angioplasty) 03/10/2011    Orientation RESPIRATION BLADDER Height & Weight     Self, Time, Situation, Place  Normal Incontinent Weight: 181 lb 4.8 oz (82.2 kg) Height:  5\' 5"  (165.1 cm)  BEHAVIORAL SYMPTOMS/MOOD NEUROLOGICAL BOWEL NUTRITION STATUS      Continent Diet (Carb Modified / Thin Liquids)  AMBULATORY STATUS COMMUNICATION OF NEEDS Skin   Extensive Assist Non-Verbally Normal                       Personal Care Assistance Level of Assistance  Bathing, Feeding, Dressing Bathing Assistance: Limited assistance Feeding assistance: Independent Dressing Assistance: Limited assistance     Functional Limitations Info  Sight, Hearing, Speech Sight Info: Adequate Hearing Info: Adequate Speech Info: Impaired    SPECIAL CARE FACTORS FREQUENCY  PT (By licensed PT), OT (By licensed OT), Speech therapy     PT Frequency: 3 OT Frequency: 3     Speech Therapy Frequency: 3      Contractures Contractures Info: Present (Previous Stroke - patient manages well)    Additional Factors Info  Code Status, Allergies, Insulin Sliding Scale, Psychotropic Code Status Info: Full Code Allergies Info: Penicillins Psychotropic Info: Lexapro Insulin Sliding Scale Info: Novolog 3 times daily       Current Medications (12/03/2015):  This is the current hospital active medication list Current Facility-Administered Medications  Medication Dose Route Frequency Provider Last Rate Last Dose  . acetaminophen (TYLENOL) tablet 650 mg  650 mg Oral Q4H PRN  Ivor Costa, MD      . ALPRAZolam Duanne Moron) tablet 0.25 mg  0.25 mg Oral BID PRN Ivor Costa, MD      . aspirin EC tablet 81 mg  81 mg Oral Daily Albertine Patricia, MD   81 mg at 12/03/15 1000  . clopidogrel (PLAVIX) tablet 75 mg  75 mg Oral Daily Albertine Patricia, MD   75 mg at  12/03/15 1000  . dextrose 50 % solution 25 mL  25 mL Intravenous PRN Ivor Costa, MD   25 mL at 12/01/15 2157  . enoxaparin (LOVENOX) injection 40 mg  40 mg Subcutaneous Q24H Ivor Costa, MD   40 mg at 12/02/15 2119  . escitalopram (LEXAPRO) tablet 20 mg  20 mg Oral Daily Albertine Patricia, MD   20 mg at 12/03/15 1000  . furosemide (LASIX) tablet 40 mg  40 mg Oral Daily Albertine Patricia, MD   40 mg at 12/03/15 1000  . gabapentin (NEURONTIN) capsule 100 mg  100 mg Oral TID Albertine Patricia, MD   100 mg at 12/03/15 1000  . hydrALAZINE (APRESOLINE) injection 5 mg  5 mg Intravenous Q2H PRN Ivor Costa, MD      . insulin aspart (novoLOG) injection 0-9 Units  0-9 Units Subcutaneous TID WC Ivor Costa, MD   5 Units at 12/03/15 1134  . insulin glargine (LANTUS) injection 10 Units  10 Units Subcutaneous Daily Albertine Patricia, MD   10 Units at 12/03/15 1000  . lisinopril (PRINIVIL,ZESTRIL) tablet 5 mg  5 mg Oral Daily Albertine Patricia, MD   5 mg at 12/03/15 1000  . metoprolol tartrate (LOPRESSOR) tablet 25 mg  25 mg Oral BID Albertine Patricia, MD   25 mg at 12/03/15 1000  . nitroGLYCERIN (NITROSTAT) SL tablet 0.4 mg  0.4 mg Sublingual Q5 min PRN Ivor Costa, MD      . ondansetron [facility]) injection 4 mg  4 mg Intravenous Q6H PRN Ivor Costa, MD      . oxyCODONE (Oxy IR/ROXICODONE) immediate release tablet 5 mg  5 mg Oral Q4H PRN Albertine Patricia, MD      . rosuvastatin (CRESTOR) tablet 40 mg  40 mg Oral Daily Albertine Patricia, MD   40 mg at 12/03/15 1000     Discharge Medications: Please see discharge summary for a list of discharge medications.  Relevant Imaging Results:  Relevant Lab Results:   Additional Information SSN [id number]   Barbette Or, [place]

## 2015-12-04 ENCOUNTER — Inpatient Hospital Stay (HOSPITAL_COMMUNITY): Payer: Medicare HMO

## 2015-12-04 DIAGNOSIS — R7989 Other specified abnormal findings of blood chemistry: Secondary | ICD-10-CM

## 2015-12-04 LAB — GLUCOSE, CAPILLARY
GLUCOSE-CAPILLARY: 235 mg/dL — AB (ref 65–99)
Glucose-Capillary: 155 mg/dL — ABNORMAL HIGH (ref 65–99)
Glucose-Capillary: 158 mg/dL — ABNORMAL HIGH (ref 65–99)
Glucose-Capillary: 247 mg/dL — ABNORMAL HIGH (ref 65–99)

## 2015-12-04 LAB — ECHOCARDIOGRAM COMPLETE
HEIGHTINCHES: 65 in
Weight: 2929.6 oz

## 2015-12-04 MED ORDER — METOPROLOL TARTRATE 25 MG PO TABS
12.5000 mg | ORAL_TABLET | Freq: Two times a day (BID) | ORAL | 6 refills | Status: DC
Start: 2015-12-04 — End: 2017-01-06

## 2015-12-04 MED ORDER — INSULIN GLARGINE 100 UNIT/ML ~~LOC~~ SOLN
12.0000 [IU] | Freq: Every day | SUBCUTANEOUS | Status: DC
  Administered 2015-12-04: 12 [IU] via SUBCUTANEOUS
  Filled 2015-12-04: qty 0.12

## 2015-12-04 MED ORDER — LISINOPRIL 5 MG PO TABS: 5.0000 mg | ORAL_TABLET | Freq: Every day | ORAL | 6 refills | Status: DC

## 2015-12-04 MED ORDER — OXYCODONE HCL 5 MG PO TABS: 5.0000 mg | ORAL_TABLET | Freq: Four times a day (QID) | ORAL | 0 refills | Status: DC | PRN

## 2015-12-04 MED ORDER — INSULIN ASPART 100 UNIT/ML ~~LOC~~ SOLN: 4.0000 [IU] | Freq: Three times a day (TID) | SUBCUTANEOUS | 11 refills | Status: DC

## 2015-12-04 MED ORDER — INSULIN GLARGINE 100 UNIT/ML ~~LOC~~ SOLN: 12.0000 [IU] | Freq: Every day | SUBCUTANEOUS | 11 refills | Status: DC

## 2015-12-04 MED ORDER — INSULIN ASPART 100 UNIT/ML ~~LOC~~ SOLN
3.0000 [IU] | Freq: Three times a day (TID) | SUBCUTANEOUS | Status: DC
  Administered 2015-12-04 (×2): 3 [IU] via SUBCUTANEOUS

## 2015-12-04 MED ORDER — ACETAMINOPHEN 325 MG PO TABS
650.0000 mg | ORAL_TABLET | ORAL | Status: DC | PRN
Start: 2015-12-04 — End: 2016-05-28

## 2015-12-04 NOTE — Discharge Summary (Signed)
[name] [name], is a 75 y.o. female  DOB 07/15/1940  MRN [id number].  Admission date:  12/01/2015  Admitting Physician  Ivor Costa, MD  Discharge Date:  12/04/2015   Primary MD  Myriam Jacobson, MD  Recommendations for primary care physician for things to follow:  - Patient will need follow-up of SLP in facility - Please monitor CBG and that just insulin dose as needed - Please check CBC, BMP in 3 days   Admission Diagnosis  Hypoglycemia [E16.2] Rhonchi [R09.89] Hypothermia, initial encounter [T68.XXXA] Severe comorbid illness [R69]   Discharge Diagnosis  Hypoglycemia [E16.2] Rhonchi [R09.89] Hypothermia, initial encounter [T68.XXXA] Severe comorbid illness [R69]    Principal Problem:   Acute encephalopathy Active Problems:   HTN (hypertension)   Cerebral infarction (HCC)   CAD (coronary artery disease), native coronary artery   Hx of CABG   Diabetes mellitus (HCC)   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 Angina   . CHF (congestive heart failure) ([place])   . Complication of anesthesia    lung   . Diabetes mellitus ([place])   . GERD (gastroesophageal reflux disease)   . H/O hiatal hernia   . Hypercholesteremia   . Hypertension   . Myocardial infarction    CABG 2001, ? stent. Cath 02/2011 with new distal LIMA-LAD 80%. SVG-OM occlusion is old, rest of grafts patent  . Peripheral vascular disease ([place])   . Stroke [facility]) 2002 and 2003   Resultant right hemiparesis and aphasia. Communicates by writing     Past Surgical History:  Procedure Laterality Date  . ABDOMINAL HYSTERECTOMY    . BREAST BIOPSY    . CARDIAC CATHETERIZATION  02/2011  . CORONARY ARTERY BYPASS GRAFT      . ESOPHAGOGASTRODUODENOSCOPY  03/12/2011   Procedure: ESOPHAGOGASTRODUODENOSCOPY (EGD);  Surgeon: [name], MD;  Location: [facility] ENDOSCOPY;  Service: Endoscopy;  Laterality: N/A;  . LEFT HEART CATHETERIZATION WITH CORONARY ANGIOGRAM N/A 03/10/2011   Procedure: LEFT HEART CATHETERIZATION WITH CORONARY ANGIOGRAM;  Surgeon: [name], MD;  Location: [facility] CATH LAB;  Service: Cardiovascular;  Laterality: N/A;       History of present illness and  Hospital Course:     Kindly see H&P for history of present illness and admission details, please review complete Labs, Consult reports and Test reports for all details in brief  HPI  from the history and physical done on the day of admission 12/01/2015  HPI: [name] [name] is a 75 y.o. 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  Per report, pt was  found to be confused and lethargic in bed at about 2:30 PM when her daughter came home. She was found to have  blood sugar was 22. She was given a bolus of dextrose by EMS and blood sugar increased to 168. At that point her mental status was back to baseline. She has hx of stroke with right sided hemiparesis and nonverbal at baseline, which seems to be at her baseline. When I saw pt in ED, she could communicate appropriately by nodding or shaking head and writing on paper. She wrote on paper that sometimes she had difficult swallowing. She denies chest pain, cough, shortness of breath, nausea, vomiting, abdominal pain. She wrote that sometimes she had hot flashes when she urinates, but denies dysuria or burning on urination.  ED Course: pt was found to have hypoglycemia with sugar  22-->91 now, positive troponin 0.12, WBC 5.7, CK 345, negative urinalysis, hyperthermia, which has resolved, no tachycardia, slightly tachycardia, oxygen saturation at 8% on room air, electrolytes and renal function okay. Patient is admitted to telemetry bed as inpatient.  Hospital Course   Acute encephalopathy - This is most likely related to hypoglycemia, mentation back to baseline .  Hypoglycemia - Unclear etiology, Unclear Lantus dose is too high for her, or related to patient  giving herself higher dose at home. - Initially on D10 W, CBG started to increase yesterday, started on Lantus 10 units with over acceptable control , recommendation is for Lantus 12 units daily, and NovoLog 4 units before meals, and this is to be monitored and adjusted frequently at facility . - Hemoglobin A1c is 7.3   Elevated troponins/history of CAD - She denies any chest pain or shortness of breath, troponin non-ACS pattern, 0.11> 0.11> 0.11 - 2-D echo with significant improvement of EF 50-55% with grade 1 diastolic dysfunction. - Continue with aspirin , statin, and metoprolol  Hypertension - Patient resumed her home medication, blood pressure is soft hospital stay, so BiDil has been stopped, metoprolol has decreased from 25 twice a day to 12.5 twice a day, and continue with lisinopril at current dose.  Diabetes mellitus - Hemoglobin A1c is 7.8 last year, it was 7.3 this admission , see above discussion under hyperglycemia  Chronic combined systolic and diastolic CHF - 2-D echo on 01/28/2014 showed EF30-35% with grade 3 diastolic dysfunction, repeat 2-D echo with significant improvement of EF 50-55% with grade 1 diastolic dysfunction. - No Evidence of volume overload, continue with home dose Lasix, beta blockers and lisinopril  Hyperlipidemia - Continue with statin  History of CVA - Continue with aspirin, Plavix and statin  Dysphagia - SLP follow-up greatly appreciated, recommendation for  dysphagia 2 with thin liquids, will need SLP follow up  outpatient  Discharge Condition: stable   Follow UP  Follow-up Information    Myriam Jacobson, MD.   Specialty:  Internal Medicine Why:  After discharge from [facility]         Tuesday, October 24th @  2p.m. Contact information: [place], [place] [address] [phone]             Discharge Instructions  and  Discharge Medications    Discharge Instructions    Discharge instructions    Complete by:  As directed    Follow with Primary MD ROBERTS, Sharol Given, MD after discharge from SNF  Get CBC, CMP,  checked  by Primary MD next visit.    Activity: As tolerated with Full fall precautions use walker/cane & assistance as  needed   Disposition SNF   Diet: Heart Healthy , carbohydrate modified, dysphagia 2 with thin liquid , with feeding assistance and aspiration precautions.  For Heart failure patients - Check your Weight same time everyday, if you gain over 2 pounds, or you develop in leg swelling, experience more shortness of breath or chest pain, call your Primary MD immediately. Follow Cardiac Low Salt Diet and 1.5 lit/day fluid restriction.   On your next visit with your primary care physician please Get Medicines reviewed and adjusted.   Please request your Prim.MD to go over all Hospital Tests and Procedure/Radiological results at the follow up, please get all Hospital records sent to your Prim MD by signing hospital release before you go home.   If you experience worsening of your admission symptoms, develop shortness of breath, life threatening emergency, suicidal or homicidal thoughts you must seek medical attention immediately by calling 911 or calling your MD immediately  if symptoms less severe.  You Must read complete instructions/literature along with all the possible adverse reactions/side effects for all the Medicines you take and that have been prescribed to you. Take any new  Medicines after you have completely understood and accpet all the possible adverse reactions/side effects.   Do not drive, operating heavy machinery, perform activities at heights, swimming or participation in water activities or provide baby sitting services if your were admitted for syncope or siezures until you have seen by Primary MD or a Neurologist and advised to do so again.  Do not drive when taking Pain medications.    Do not take more than prescribed Pain, Sleep and Anxiety Medications  Special Instructions: If you have smoked or chewed Tobacco  in the last 2 yrs please stop smoking, stop any regular Alcohol  and or any Recreational drug use.  Wear Seat belts while driving.   Please note  You were cared for by a hospitalist during your hospital stay. If you have any questions about your discharge medications or the care you received while you were in the hospital after you are discharged, you can call the unit and asked to speak with the hospitalist on call if the hospitalist that took care of you is not available. Once you are discharged, your primary care physician will handle any further medical issues. Please note that NO REFILLS for any discharge medications will be authorized once you are discharged, as it is imperative that you return to your primary care physician (or establish a relationship with a primary care physician if you do not have one) for your aftercare needs so that they can reassess your need for medications and monitor your lab values.   Increase activity slowly    Complete by:  As directed        Medication List    STOP taking these medications   doxycycline 100 MG tablet Commonly known as:  VIBRA-TABS   isosorbide-hydrALAZINE 20-37.5 MG tablet Commonly known as:  BIDIL     TAKE these medications   acetaminophen 325 MG tablet Commonly known as:  TYLENOL Take 2 tablets (650 mg total) by mouth every 4 (four) hours as needed for headache or mild  pain.   aspirin 81 MG EC tablet Take 1 tablet (81 mg total) by mouth daily.   clopidogrel 75 MG tablet Commonly known as:  PLAVIX Take 75 mg by mouth daily.   escitalopram 20 MG tablet Commonly known as:  LEXAPRO Take 20 mg by mouth daily.   furosemide 40 MG  tablet Commonly known as:  LASIX Take 1 tablet (40 mg total) by mouth daily.   gabapentin 100 MG capsule Commonly known as:  NEURONTIN Take 1 capsule (100 mg total) by mouth 3 (three) times daily.   insulin aspart 100 UNIT/ML injection Commonly known as:  novoLOG Inject 4 Units into the skin 3 (three) times daily with meals. What changed:  how much to take   insulin glargine 100 UNIT/ML injection Commonly known as:  LANTUS Inject 0.12 mLs (12 Units total) into the skin daily. What changed:  how much to take  when to take this   lisinopril 5 MG tablet Commonly known as:  PRINIVIL,ZESTRIL Take 1 tablet (5 mg total) by mouth daily.   metoprolol tartrate 25 MG tablet Commonly known as:  LOPRESSOR Take 0.5 tablets (12.5 mg total) by mouth 2 (two) times daily. What changed:  how much to take   nitroGLYCERIN 0.4 MG SL tablet Commonly known as:  NITROSTAT Place 0.4 mg under the tongue every 5 (five) minutes as needed for chest pain.   oxyCODONE 5 MG immediate release tablet Commonly known as:  Oxy IR/ROXICODONE Take 1 tablet (5 mg total) by mouth every 6 (six) hours as needed for severe pain. What changed:  when to take this   potassium chloride SA 20 MEQ tablet Commonly known as:  K-DUR,KLOR-CON Take 1 tablet (20 mEq total) by mouth daily.   rosuvastatin 40 MG tablet Commonly known as:  CRESTOR Take 40 mg by mouth daily.         Diet and Activity recommendation: See Discharge Instructions above   Consults obtained - none   Major procedures and Radiology Reports - PLEASE review detailed and final reports for all details, in brief -     Dg Chest Port 1 View  Result Date: 12/01/2015 CLINICAL  DATA:  Altered mental status. History of CVA with right-sided deficit. EXAM: PORTABLE CHEST 1 VIEW COMPARISON:  10/03/2015 FINDINGS: Postsurgical changes from CABG. The cardiac silhouette is stably enlarged. Mediastinal contours appear intact. There is no evidence of focal airspace consolidation, pleural effusion or pneumothorax. Osseous structures are without acute abnormality. Soft tissues are grossly normal. IMPRESSION: Enlarged cardiac silhouette, otherwise no evidence of acute cardiopulmonary process. Electronically Signed   By: Fidela Salisbury M.D.   On: 12/01/2015 21:15   Dg Swallowing Func-speech Pathology  Result Date: 12/03/2015 Objective Swallowing Evaluation: Type of Study: MBS-Modified Barium Swallow Study Patient Details Name: [name] [name] MRN: [id number] Date of Birth: [birth year] Today's Date: 12/03/2015 Time: SLP Start Time (ACUTE ONLY): 0955-SLP Stop Time (ACUTE ONLY): 1015 SLP Time Calculation (min) (ACUTE ONLY): 20 min Past Medical History: Past Medical History: Diagnosis Date . Angina  . CHF (congestive heart failure) ([place])  . Complication of anesthesia   lung  . Diabetes mellitus ([place])  . GERD (gastroesophageal reflux disease)  . H/O hiatal hernia  . Hypercholesteremia  . Hypertension  . Myocardial infarction   CABG 2001, ? stent. Cath 02/2011 with new distal LIMA-LAD 80%. SVG-OM occlusion is old, rest of grafts patent . Peripheral vascular disease ([place])  . Stroke [facility]) 2002 and 2003  Resultant right hemiparesis and aphasia. Communicates by writing  Past Surgical History: Past Surgical History: Procedure Laterality Date . ABDOMINAL HYSTERECTOMY   . BREAST BIOPSY   . CARDIAC CATHETERIZATION  02/2011 . CORONARY ARTERY BYPASS GRAFT   . ESOPHAGOGASTRODUODENOSCOPY  03/12/2011  Procedure: ESOPHAGOGASTRODUODENOSCOPY (EGD);  Surgeon: [name], MD;  Location: [facility] ENDOSCOPY;  Service: Endoscopy;  Laterality: N/A; .  LEFT HEART CATHETERIZATION WITH CORONARY ANGIOGRAM N/A 03/10/2011  Procedure: LEFT  HEART CATHETERIZATION WITH CORONARY ANGIOGRAM;  Surgeon: [name], MD;  Location: [facility] CATH LAB;  Service: Cardiovascular;  Laterality: N/A; HPI: 75 yo female admitted for AMS secondary to hypoglycemia. 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 She also has residual dysphagia due in large part to reduced oral control. She has had multiple swallowing assessments in the past, with most recent recommending regular diet and thin liquids. Pt in the past has declined PEG and repeat MBS, preferring to eat/drink what she would like as she uses compensatory strategies to mitigate aspiration risk. Subjective: The patient was seen in radiology for MBS to determine current swallow function.  Assessment / Plan / Recommendation CHL IP CLINICAL IMPRESSIONS 12/03/2015 Therapy Diagnosis Severe oral phase dysphagia;Mild pharyngeal phase dysphagia Clinical Impression MBS was completed using thin liquids via tsp, cup and straw sips, nectar thick liquids via tsp and cup sips, pureed material and solids.  The patient presented with a severe oral and mild pharyngeal dysphagia characterized by a discoordinated, slow oral phase with oral residue and anterior loss of most boluses from the right and a delayed swallow trigger (i.e. most boluses triggered in the pyriform sinuses).  Aspiration with a cough was seen during the swallow given thin liquids via spoon sips.  Use of a chin tuck was successful to prevent.  The patient performed the chin tuck better when taking small sips from a straw to facilitate a head down position at the start of the sip.  Mild vallecular residue was seen x 1 bolus of thins via straw sips while using a chin tuck.  Esophageal sweep did not reveal overt issues.  Long discussion with the patient regarding length of meal times and the fact that she has lost weight and has issues with low blood sugars due to lack of intake.  Suggested that the patient order as much  pureed material as possible and limit things that require a lot of chewing to 1 item per tray.  Use of a supplement should also be considered.  Smaller, more frequent meals may also help to increase intake and help to manage her blood sugars.  Recommend that the patient be allowed to have what she would like to eat and thin liquids with use a chin tuck with all liquids boluses.  ST will follow up for therapeutic diet tolerance and education.   Impact on safety and function Moderate aspiration risk   CHL IP TREATMENT RECOMMENDATION 12/03/2015 Treatment Recommendations Therapy as outlined in treatment plan below   Prognosis 12/03/2015 Prognosis for Safe Diet Advancement Fair Barriers to Reach Goals Severity of deficits Barriers/Prognosis Comment -- CHL IP DIET RECOMMENDATION 12/03/2015 SLP Diet Recommendations Regular solids;Thin liquid Liquid Administration via Straw Medication Administration Crushed with puree Compensations Minimize environmental distractions;Slow rate;Small sips/bites;Chin tuck;Monitor for anterior loss;Use straw to facilitate chin tuck Postural Changes Remain semi-upright after after feeds/meals (Comment);Seated upright at 90 degrees   CHL IP OTHER RECOMMENDATIONS 12/03/2015 Recommended Consults -- Oral Care Recommendations Oral care BID Other Recommendations Have oral suction available   CHL IP FOLLOW UP RECOMMENDATIONS 12/03/2015 Follow up Recommendations (No Data)   CHL IP FREQUENCY AND DURATION 12/03/2015 Speech Therapy Frequency (ACUTE ONLY) min 2x/week Treatment Duration 2 weeks      CHL IP ORAL PHASE 12/03/2015 Oral Phase Impaired Oral - Pudding Teaspoon -- Oral - Pudding Cup -- Oral - Honey Teaspoon -- Oral -  Honey Cup -- Oral - Nectar Teaspoon Right anterior bolus loss;Weak lingual manipulation;Reduced posterior propulsion;Delayed oral transit;Decreased bolus cohesion;Premature spillage Oral - Nectar Cup Right anterior bolus loss;Weak lingual manipulation;Reduced posterior propulsion;Delayed  oral transit;Premature spillage;Decreased bolus cohesion Oral - Nectar Straw -- Oral - Thin Teaspoon Right anterior bolus loss;Weak lingual manipulation;Reduced posterior propulsion;Delayed oral transit;Decreased bolus cohesion;Premature spillage Oral - Thin Cup Right anterior bolus loss;Weak lingual manipulation;Reduced posterior propulsion;Delayed oral transit;Decreased bolus cohesion;Premature spillage Oral - Thin Straw Right anterior bolus loss;Weak lingual manipulation;Reduced posterior propulsion;Delayed oral transit;Decreased bolus cohesion;Premature spillage Oral - Puree Premature spillage;Decreased bolus cohesion;Delayed oral transit;Reduced posterior propulsion;Weak lingual manipulation Oral - Mech Soft -- Oral - Regular -- Oral - Multi-Consistency Weak lingual manipulation;Reduced posterior propulsion;Delayed oral transit;Decreased bolus cohesion;Premature spillage Oral - Pill -- Oral Phase - Comment --  CHL IP PHARYNGEAL PHASE 12/03/2015 Pharyngeal Phase Impaired Pharyngeal- Pudding Teaspoon -- Pharyngeal -- Pharyngeal- Pudding Cup -- Pharyngeal -- Pharyngeal- Honey Teaspoon -- Pharyngeal -- Pharyngeal- Honey Cup -- Pharyngeal -- Pharyngeal- Nectar Teaspoon Delayed swallow initiation-pyriform sinuses Pharyngeal -- Pharyngeal- Nectar Cup Delayed swallow initiation-pyriform sinuses Pharyngeal -- Pharyngeal- Nectar Straw -- Pharyngeal -- Pharyngeal- Thin Teaspoon Delayed swallow initiation-pyriform sinuses;Penetration/Aspiration during swallow Pharyngeal Material enters airway, passes BELOW cords and not ejected out despite cough attempt by patient Pharyngeal- Thin Cup Delayed swallow initiation-pyriform sinuses;Penetration/Aspiration during swallow Pharyngeal Material does not enter airway Pharyngeal- Thin Straw Delayed swallow initiation-pyriform sinuses Pharyngeal -- Pharyngeal- Puree Delayed swallow initiation-pyriform sinuses Pharyngeal -- Pharyngeal- Mechanical Soft -- Pharyngeal -- Pharyngeal- Regular  -- Pharyngeal -- Pharyngeal- Multi-consistency Delayed swallow initiation-vallecula Pharyngeal -- Pharyngeal- Pill -- Pharyngeal -- Pharyngeal Comment --  CHL IP CERVICAL ESOPHAGEAL PHASE 12/03/2015 Cervical Esophageal Phase WFL Pudding Teaspoon -- Pudding Cup -- Honey Teaspoon -- Honey Cup -- Nectar Teaspoon -- Nectar Cup -- Nectar Straw -- Thin Teaspoon -- Thin Cup -- Thin Straw -- Puree -- Mechanical Soft -- Regular -- Multi-consistency -- Pill -- Cervical Esophageal Comment -- CHL IP GO 05/05/2012 Functional Assessment Tool Used clinical judgement Functional Limitations Swallowing Swallow Current Status [id number]) CM Swallow Goal Status [id number]) CL Swallow Discharge Status [id number]) (None) Motor Speech Current Status [id number]) (None) Motor Speech Goal Status UK:060616) (None) Motor Speech Goal Status SA:931536) (None) Spoken Language Comprehension Current Status [id number]) (None) Spoken Language Comprehension Goal Status [id number]) (None) Spoken Language Comprehension Discharge Status [id number]) (None) Spoken Language Expression Current Status [id number]) (None) Spoken Language Expression Goal Status [id number]) (None) Spoken Language Expression Discharge Status [id number]:1565649) (None) Attention Current Status [id number]) (None) Attention Goal Status [id number]) (None) Attention Discharge Status [id number]) (None) Memory Current Status [id number]) (None) Memory Goal Status [id number]) (None) Memory Discharge Status QC:115444) (None) Voice Current Status [id number]) (None) Voice Goal Status [id number]) (None) Voice Discharge Status [id number]) (None) Other Speech-Language Pathology Functional Limitation [id number]:978821) (None) Other Speech-Language Pathology Functional Limitation Goal Status [id number]) (None) Other Speech-Language Pathology Functional Limitation Discharge Status [phone]) (None) Shelly Flatten, MA, CCC-SLP Acute Rehab SLP [phone] [name] 12/03/2015, 11:44 AM               Micro Results     No results found for this or any previous visit (from the past 240  hour(s)).     Today   Subjective:   [name] [name] today has no headache,no chest or  abdominal pain, no significant events overnight  Objective:   Blood pressure (!) 129/56, pulse 64, temperature 98.5 F (36.9 C), temperature source Oral, resp. rate 18, height 5\' 5"  (1.651 m), weight 83.1 kg (183 lb 1.6 oz),  SpO2 95 %.   Intake/Output Summary (Last 24 hours) at 12/04/15 1442 Last data filed at 12/03/15 1816  Gross per 24 hour  Intake              240 ml  Output                0 ml  Net              240 ml    Exam Awake Alert, Oriented X 3, Communicate through writing Supple Neck,No JVD. Symmetrical Chest wall movement, Good air movement bilaterally, CTAB RRR,No Gallops,Rubs or new Murmurs, No Parasternal Heave +ve B.Sounds, Abd Soft, No tenderness,  No Cyanosis, Clubbing or edema, Patient with right hemiparesis  Data Review   CBC w Diff:  Lab Results  Component Value Date   WBC 5.7 12/03/2015   HGB 10.0 (L) 12/03/2015   HCT 32.6 (L) 12/03/2015   PLT 129 (L) 12/03/2015   LYMPHOPCT 15 12/01/2015   MONOPCT 4 12/01/2015   EOSPCT 0 12/01/2015   BASOPCT 0 12/01/2015    CMP:  Lab Results  Component Value Date   NA 139 12/03/2015   K 4.0 12/03/2015   CL 111 12/03/2015   CO2 22 12/03/2015   BUN 15 12/03/2015   CREATININE 0.98 12/03/2015   PROT 7.7 12/01/2015   ALBUMIN 4.0 12/01/2015   BILITOT 0.8 12/01/2015   ALKPHOS 56 12/01/2015   AST 30 12/01/2015   ALT 15 12/01/2015  .   Total Time in preparing paper work, data evaluation and todays exam - 35 minutes  Ellwyn Ergle M.D on 12/04/2015 at 2:42 PM  [facility]   Office  [phone]

## 2015-12-04 NOTE — Progress Notes (Signed)
*  PRELIMINARY RESULTS* Echocardiogram 2D Echocardiogram has been performed.  Sherrie Sport 12/04/2015, 1:40 PM

## 2015-12-04 NOTE — Discharge Instructions (Signed)
Follow with Primary MD ROBERTS, Sharol Given, MD after discharge from SNF  Get CBC, CMP,  checked  by Primary MD next visit.    Activity: As tolerated with Full fall precautions use walker/cane & assistance as needed   Disposition SNF   Diet: Heart Healthy , carbohydrate modified, dysphagia 2 with thin liquid , with feeding assistance and aspiration precautions.  For Heart failure patients - Check your Weight same time everyday, if you gain over 2 pounds, or you develop in leg swelling, experience more shortness of breath or chest pain, call your Primary MD immediately. Follow Cardiac Low Salt Diet and 1.5 lit/day fluid restriction.   On your next visit with your primary care physician please Get Medicines reviewed and adjusted.   Please request your Prim.MD to go over all Hospital Tests and Procedure/Radiological results at the follow up, please get all Hospital records sent to your Prim MD by signing hospital release before you go home.   If you experience worsening of your admission symptoms, develop shortness of breath, life threatening emergency, suicidal or homicidal thoughts you must seek medical attention immediately by calling 911 or calling your MD immediately  if symptoms less severe.  You Must read complete instructions/literature along with all the possible adverse reactions/side effects for all the Medicines you take and that have been prescribed to you. Take any new Medicines after you have completely understood and accpet all the possible adverse reactions/side effects.   Do not drive, operating heavy machinery, perform activities at heights, swimming or participation in water activities or provide baby sitting services if your were admitted for syncope or siezures until you have seen by Primary MD or a Neurologist and advised to do so again.  Do not drive when taking Pain medications.    Do not take more than prescribed Pain, Sleep and Anxiety Medications  Special  Instructions: If you have smoked or chewed Tobacco  in the last 2 yrs please stop smoking, stop any regular Alcohol  and or any Recreational drug use.  Wear Seat belts while driving.   Please note  You were cared for by a hospitalist during your hospital stay. If you have any questions about your discharge medications or the care you received while you were in the hospital after you are discharged, you can call the unit and asked to speak with the hospitalist on call if the hospitalist that took care of you is not available. Once you are discharged, your primary care physician will handle any further medical issues. Please note that NO REFILLS for any discharge medications will be authorized once you are discharged, as it is imperative that you return to your primary care physician (or establish a relationship with a primary care physician if you do not have one) for your aftercare needs so that they can reassess your need for medications and monitor your lab values.

## 2015-12-04 NOTE — Progress Notes (Signed)
RN attempted report for discharge. After staying on hold for 12  mins the RN was hung up on. Hoover Brunette, RN

## 2015-12-04 NOTE — Progress Notes (Signed)
Speech Language Pathology Treatment: Dysphagia  Patient Details Name: [name] [name] MRN: [id number] DOB: 1940-03-15 Today's Date: 12/04/2015 Time: [id number] SLP Time Calculation (min) (ACUTE ONLY): 23 min  Assessment / Plan / Recommendation Clinical Impression  RN present upon SLP arrival and pt was refusing to take medication crushed in puree. Pt refused additional PO trials due to increased coughing at bedside. SLP did extensive education on the risk of aspiration and diet tolerance. Coughing on secretions was observed throughout session. Pt reports coughing at breakfast was due to the types of food on her tray (dry eggs, grits). Pt has baseline dysphagia (i.e. poor oral control, management of secretions), but wishes to accept the risks of aspiration in order to continue with oral intake. She is adamant that she does not want alternative means of nutrition. MD may wish to consider palliative care consult and/or GOC discussion given pt's wishes, chronic risk, and current full code status. Recommend pt continue with Dys 2 diet and thin liquids with the use of a chin tuck. Please allow pt to take medication in bites of food and assist in meal selection so that pt may select food items that she believes she can handle. Will f/u for diet tolerance and education.   HPI HPI: 75 yo female admitted for AMS secondary to hypoglycemia. 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 She also has residual dysphagia due in large part to reduced oral control. She has had multiple swallowing assessments in the past, with most recent recommending regular diet and thin liquids. Pt in the past has declined PEG and repeat MBS, preferring to eat/drink what she would like as she uses compensatory strategies to mitigate aspiration risk.      SLP Plan  Continue with current plan of care     Recommendations  Diet recommendations: Dysphagia 2 (fine chop);Thin liquid Liquids  provided via: Straw;Cup Medication Administration: Other (Comment) (whole in food) Supervision: Full supervision/cueing for compensatory strategies;Staff to assist with self feeding Compensations: Minimize environmental distractions;Slow rate;Small sips/bites Postural Changes and/or Swallow Maneuvers: Seated upright 90 degrees;Upright 30-60 min after meal;Chin tuck                Oral Care Recommendations: Oral care BID;Oral care before and after PO Follow up Recommendations: Skilled Nursing facility Plan: Continue with current plan of care       [place], Student SLP  Shela Leff 12/04/2015, 11:12 AM

## 2015-12-04 NOTE — Clinical Social Work Placement (Signed)
   CLINICAL SOCIAL WORK PLACEMENT  NOTE  Date:  12/04/2015  Patient Details  Name: [name] [name] MRN: [id number] Date of Birth: December 14, 1940  Clinical Social Work is seeking post-discharge placement for this patient at the [place] level of care (*CSW will initial, date and re-position this form in  chart as items are completed):  Yes   Patient/family provided with [facility] Work Department's list of facilities offering this level of care within the geographic area requested by the patient (or if unable, by the patient's family).  Yes   Patient/family informed of their freedom to choose among providers that offer the needed level of care, that participate in Medicare, Medicaid or managed care program needed by the patient, have an available bed and are willing to accept the patient.  Yes   Patient/family informed of [facility]'s ownership interest in [facility] and [facility], as well as of the fact that they are under no obligation to receive care at these facilities.  PASRR submitted to EDS on       PASRR number received on       Existing PASRR number confirmed on 12/03/15     FL2 transmitted to all facilities in geographic area requested by pt/family on 12/03/15     FL2 transmitted to all facilities within larger geographic area on       Patient informed that his/her managed care company has contracts with or will negotiate with certain facilities, including the following:        Yes   Patient/family informed of bed offers received.  Patient chooses bed at [facility]     Physician recommends and patient chooses bed at      Patient to be transferred to [facility] on 12/04/15.  Patient to be transferred to facility by ambulance     Patient family notified on 12/04/15 of transfer.  Name of family member notified:  Boggs,Sharon     PHYSICIAN Please prepare priority discharge summary, including  medications, Please sign FL2     Additional Comment:  Per MD patient is ready to discharge to [facility]. RN, patient, patient's family, and facility notified of discharge. RN given phone number for report and transport packet is on patient's chart. Ambulance transport requested. CSW signing off.   _______________________________________________ Samule Dry, LCSW 12/04/2015, 3:12 PM

## 2016-01-08 ENCOUNTER — Ambulatory Visit: Payer: Medicare HMO | Admitting: Sports Medicine

## 2016-01-23 ENCOUNTER — Telehealth: Payer: Self-pay | Admitting: Sports Medicine

## 2016-01-23 NOTE — Telephone Encounter (Signed)
Patient's physical therapist called on behalf of the patient wanting to know the status of her diabetic shoes. Stated they called the PCP and was told they faxed [place] the paperwork on 03 October. Please call daughter [name] with status of diabetic shoes.

## 2016-02-29 ENCOUNTER — Telehealth: Payer: Self-pay | Admitting: Sports Medicine

## 2016-02-29 NOTE — Telephone Encounter (Signed)
pts daughter called asking about  pts diabetic shoes

## 2016-03-04 ENCOUNTER — Other Ambulatory Visit: Payer: Self-pay | Admitting: Internal Medicine

## 2016-03-04 DIAGNOSIS — Z1231 Encounter for screening mammogram for malignant neoplasm of breast: Secondary | ICD-10-CM

## 2016-03-18 ENCOUNTER — Encounter: Payer: Self-pay | Admitting: Sports Medicine

## 2016-03-18 ENCOUNTER — Ambulatory Visit (INDEPENDENT_AMBULATORY_CARE_PROVIDER_SITE_OTHER): Payer: Medicare HMO | Admitting: Sports Medicine

## 2016-03-18 DIAGNOSIS — L84 Corns and callosities: Secondary | ICD-10-CM

## 2016-03-18 DIAGNOSIS — M79672 Pain in left foot: Secondary | ICD-10-CM

## 2016-03-18 DIAGNOSIS — I739 Peripheral vascular disease, unspecified: Secondary | ICD-10-CM

## 2016-03-18 DIAGNOSIS — M79671 Pain in right foot: Secondary | ICD-10-CM | POA: Diagnosis not present

## 2016-03-18 DIAGNOSIS — B351 Tinea unguium: Secondary | ICD-10-CM | POA: Diagnosis not present

## 2016-03-18 DIAGNOSIS — I70223 Atherosclerosis of native arteries of extremities with rest pain, bilateral legs: Secondary | ICD-10-CM

## 2016-03-18 DIAGNOSIS — E1142 Type 2 diabetes mellitus with diabetic polyneuropathy: Secondary | ICD-10-CM

## 2016-03-18 NOTE — Progress Notes (Signed)
Subjective: [name] [name] is a 76 y.o. female patient with history of diabetes who returns to office today complaining of long, painful nails; unable to trim. Patient states that the glucose reading this morning was "good" and is assisted by daughter. Patient denies any new changes in medication or new problems. Patient denies any new cramping, numbness, burning or tingling in the legs. Tingling and swelling in legs unchanged from prior with right worse than left with pain with positions when sleeping/elevation.  Patient Active Problem List   Diagnosis Date Noted  . Chronic combined systolic and diastolic CHF (congestive heart failure) ([place]) 12/01/2015  . Acute encephalopathy 12/01/2015  . Hypoglycemia 12/01/2015  . Elevated troponin 12/01/2015  . Diabetes mellitus without complication ([place])   . Hypothermia   . Rhonchi   . Acute respiratory failure ([place]) 01/28/2014  . Respiratory distress 01/27/2014  . Hypoxia 01/27/2014  . CHF exacerbation ([place]) 01/27/2014  . Acute on chronic systolic CHF (congestive heart failure) ([place]) 01/27/2014  . Systolic CHF, acute on chronic ([place]) 01/27/2014  . Increased anion gap metabolic acidosis [id number]  . Right foot ulcer ([place]) 01/27/2014  . Aspiration pneumonia ([place]) 06/23/2012  . Dysphagia 06/21/2012  . Palliative care encounter 06/20/2012  . Healthcare-associated pneumonia 06/16/2012  . TIA (transient ischemic attack) 05/05/2012  . N&V (nausea and vomiting) 05/04/2012  . Weakness generalized 05/04/2012  . 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 NSTEMI (non-ST elevated myocardial infarction) ([place]) 07/27/2011  . Diabetes mellitus ([place])   . Chest pain 03/10/2011  . HTN (hypertension) 03/10/2011  . Cerebral infarction ([place]) 03/10/2011  . CAD (coronary artery disease), native coronary artery 03/10/2011  . 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 S/P PTCA (percutaneous transluminal coronary  angioplasty) 03/10/2011   Current Outpatient Prescriptions on File Prior to Visit  Medication Sig Dispense Refill  . acetaminophen (TYLENOL) 325 MG tablet Take 2 tablets (650 mg total) by mouth every 4 (four) hours as needed for headache or mild pain.    Marland Kitchen aspirin EC 81 MG EC tablet Take 1 tablet (81 mg total) by mouth daily. (Patient not taking: Reported on 12/01/2015)    . clopidogrel (PLAVIX) 75 MG tablet Take 75 mg by mouth daily.      Marland Kitchen escitalopram (LEXAPRO) 20 MG tablet Take 20 mg by mouth daily.    . furosemide (LASIX) 40 MG tablet Take 1 tablet (40 mg total) by mouth daily. 30 tablet 3  . gabapentin (NEURONTIN) 100 MG capsule Take 1 capsule (100 mg total) by mouth 3 (three) times daily. 90 capsule 3  . insulin aspart (NOVOLOG) 100 UNIT/ML injection Inject 4 Units into the skin 3 (three) times daily with meals. 10 mL 11  . insulin glargine (LANTUS) 100 UNIT/ML injection Inject 0.12 mLs (12 Units total) into the skin daily. 10 mL 11  . lisinopril (PRINIVIL,ZESTRIL) 5 MG tablet Take 1 tablet (5 mg total) by mouth daily. 30 tablet 6  . metoprolol tartrate (LOPRESSOR) 25 MG tablet Take 0.5 tablets (12.5 mg total) by mouth 2 (two) times daily. 60 tablet 6  . nitroGLYCERIN (NITROSTAT) 0.4 MG SL tablet Place 0.4 mg under the tongue every 5 (five) minutes as needed for chest pain.    Marland Kitchen oxyCODONE (OXY IR/ROXICODONE) 5 MG immediate release tablet Take 1 tablet (5 mg total) by mouth every 6 (six) hours as needed for severe pain. 20 tablet 0  . potassium chloride SA (K-DUR,KLOR-CON) 20 MEQ tablet Take 1 tablet (  20 mEq total) by mouth daily. (Patient not taking: Reported on 12/01/2015) 30 tablet 1  . rosuvastatin (CRESTOR) 40 MG tablet Take 40 mg by mouth daily.     No current facility-administered medications on file prior to visit.    Allergies  Allergen Reactions  . Penicillins Swelling    Swelling of mouth Has patient had a PCN reaction causing immediate rash, facial/tongue/throat swelling, SOB  or lightheadedness with hypotension: YES Has patient had a PCN reaction causing severe rash involving mucus membranes or skin necrosis: NO Has patient had a PCN reaction that required hospitalization NO Has patient had a PCN reaction occurring within the last 10 years: NO If all of the above answers are "NO", then may proceed with Cephalosporin use.    No results found for this or any previous visit (from the past 2160 hour(s)).  Objective: General: Patient is awake, alert, and oriented x 3 and in no acute distress.Limited speech secondary to stroke with Wheelchair assisted gait  Integument: Skin is warm, dry and supple bilateral. Nails are tender, long, thickened and dystrophic with subungual debris, consistent with onychomycosis, 1-5 bilateral. No signs of infection. No open lesions or preulcerative lesions present bilateral. Old burn well healed dorsum of right foot and dry callus heel bilateral. Remaining integument unremarkable.  Vasculature:  Dorsalis Pedis pulse 1/4 bilateral. Posterior Tibial pulse  0/4 bilateral. Capillary fill time <5 sec 1-5 bilateral. No hair growth to the level of the digits.Temperature gradient within normal limits. No varicosities present bilateral. +1 edema present bilateral.   Neurology: The patient has absent sensation measured with a 5.07/10g Semmes Weinstein Monofilament at all pedal sites bilateral . Vibratory sensation absent bilateral with tuning fork. No Babinski sign present bilateral. Subjective tingling bilateral.   Musculoskeletal: Asymptomatic hammertoe pedal deformities noted bilateral. Pain with light touch bilateral right>left foot and ankle. Muscular strength 4/5 on left 3/5 on right. No tenderness with calf compression bilateral.  Assessment and Plan: Problem List Items Addressed This Visit    None    Visit Diagnoses    Dermatophytosis of nail    -  Primary   Foot pain, bilateral       PVD (peripheral vascular disease) ([place])        Diabetic polyneuropathy associated with type 2 diabetes mellitus ([place])       Atherosclerosis of native artery of both lower extremities with rest pain ([place])       Heel callus          -Examined patient. -Discussed and educated patient on diabetic foot care, especially with  regards to the vascular, neurological and musculoskeletal systems.  -Stressed the importance of good glycemic control and the detriment of not controlling glucose levels in relation to the foot. -Mechanically debrided all nails 1-5 bilateral using sterile nail nipper and filed with dremel without incident  -Completed again, [employer] diabetic shoe order form.office to contact primary care for approval / certification;Patient was molded this visit and will send out molds and shoe order once her PCP has signed her certification paperwork -Rx vascular studies for rest pain  -Recommend follow up with PCP for lasix adjustment and for possible increase in Gabapentin  -Recommend topical aspercreme or icy hot with lidocaine for pain  -Answered all patient questions -Patient to return  in 3 months for at risk foot care -Patient advised to call the office if any problems or questions arise in the meantime.  Landis Martins, DPM

## 2016-03-19 ENCOUNTER — Telehealth: Payer: Self-pay | Admitting: *Deleted

## 2016-03-19 DIAGNOSIS — M79672 Pain in left foot: Principal | ICD-10-CM

## 2016-03-19 DIAGNOSIS — M79671 Pain in right foot: Secondary | ICD-10-CM

## 2016-03-19 DIAGNOSIS — I739 Peripheral vascular disease, unspecified: Secondary | ICD-10-CM

## 2016-03-19 NOTE — Telephone Encounter (Addendum)
-----   Message from Landis Martins, [place] sent at 03/18/2016  5:08 PM EST ----- Regarding: vascular studies Please order ABI/PVRs. Patient has pain at rest when elevating legs that is worse right>left -Dr Cannon Kettle. 03/19/2016-Faxed orders to [facility]. 05/19/2016-Pt's dtr, [name] states message left from [place], stating the diabetic shoe forms had been faxed to the PCP 5-6 times and no response from the PCP. [name] states she spoke with PCP office and the diabetic shoe paperwork had been faxed to our office 11/2015 and the PCP obviously feels the paperwork is within the required time limit.

## 2016-03-31 ENCOUNTER — Ambulatory Visit: Payer: Medicare HMO

## 2016-04-04 ENCOUNTER — Ambulatory Visit: Payer: Medicare HMO

## 2016-04-04 ENCOUNTER — Ambulatory Visit
Admission: RE | Admit: 2016-04-04 | Discharge: 2016-04-04 | Disposition: A | Discharge status: Home / Self Care | Payer: Medicare HMO | Source: Ambulatory Visit | Attending: Internal Medicine | Admitting: Internal Medicine

## 2016-04-04 DIAGNOSIS — Z1231 Encounter for screening mammogram for malignant neoplasm of breast: Secondary | ICD-10-CM

## 2016-04-08 ENCOUNTER — Other Ambulatory Visit: Payer: Self-pay | Admitting: Internal Medicine

## 2016-04-08 DIAGNOSIS — R928 Other abnormal and inconclusive findings on diagnostic imaging of breast: Secondary | ICD-10-CM

## 2016-04-18 ENCOUNTER — Ambulatory Visit
Admission: RE | Admit: 2016-04-18 | Discharge: 2016-04-18 | Disposition: A | Discharge status: Home / Self Care | Payer: Medicare HMO | Source: Ambulatory Visit | Attending: Internal Medicine | Admitting: Internal Medicine

## 2016-04-18 ENCOUNTER — Other Ambulatory Visit: Payer: Self-pay | Admitting: Internal Medicine

## 2016-04-18 DIAGNOSIS — R928 Other abnormal and inconclusive findings on diagnostic imaging of breast: Secondary | ICD-10-CM

## 2016-04-18 DIAGNOSIS — N6489 Other specified disorders of breast: Secondary | ICD-10-CM

## 2016-04-18 DIAGNOSIS — R599 Enlarged lymph nodes, unspecified: Secondary | ICD-10-CM

## 2016-04-18 DIAGNOSIS — N6310 Unspecified lump in the right breast, unspecified quadrant: Secondary | ICD-10-CM

## 2016-04-23 ENCOUNTER — Other Ambulatory Visit: Payer: Self-pay | Admitting: Internal Medicine

## 2016-04-23 DIAGNOSIS — N6310 Unspecified lump in the right breast, unspecified quadrant: Secondary | ICD-10-CM

## 2016-04-25 ENCOUNTER — Ambulatory Visit
Admission: RE | Admit: 2016-04-25 | Discharge: 2016-04-25 | Disposition: A | Discharge status: Home / Self Care | Payer: Medicare HMO | Source: Ambulatory Visit | Attending: Internal Medicine | Admitting: Internal Medicine

## 2016-04-25 DIAGNOSIS — N6310 Unspecified lump in the right breast, unspecified quadrant: Secondary | ICD-10-CM

## 2016-05-15 ENCOUNTER — Ambulatory Visit: Payer: Self-pay | Admitting: General Surgery

## 2016-05-23 ENCOUNTER — Telehealth: Payer: Self-pay | Admitting: Oncology

## 2016-05-23 ENCOUNTER — Encounter: Payer: Self-pay | Admitting: Oncology

## 2016-05-23 NOTE — Telephone Encounter (Signed)
Spoke to the pt's daughter to schedule an appt for her mother. Pt has been scheduled to see Dr. Jana Hakim on 4/4 at [facility] to have the pt arrive 30 minutes early. Demographics verified. Letter mailed.

## 2016-05-28 ENCOUNTER — Ambulatory Visit (HOSPITAL_BASED_OUTPATIENT_CLINIC_OR_DEPARTMENT_OTHER): Payer: Medicare HMO | Admitting: Oncology

## 2016-05-28 DIAGNOSIS — C50511 Malignant neoplasm of lower-outer quadrant of right female breast: Secondary | ICD-10-CM | POA: Diagnosis not present

## 2016-05-28 DIAGNOSIS — Z170 Estrogen receptor positive status [ER+]: Secondary | ICD-10-CM | POA: Diagnosis not present

## 2016-05-28 DIAGNOSIS — C773 Secondary and unspecified malignant neoplasm of axilla and upper limb lymph nodes: Secondary | ICD-10-CM | POA: Diagnosis not present

## 2016-05-28 MED ORDER — ANASTROZOLE 1 MG PO TABS: 1.0000 mg | ORAL_TABLET | Freq: Every day | ORAL | 4 refills | Status: DC

## 2016-05-28 NOTE — Progress Notes (Signed)
[facility]  [phone] [phone]     ID: [name] [name] DOB: 10/14/1940  MR#: [id number]  CSN#:[id number]  Patient Care Team: Ronald Roberts, MD as PCP - General (Internal Medicine) MAGRINAT,GUSTAV C, MD OTHER MD:  CHIEF COMPLAINT:   CURRENT TREATMENT:    BREAST CANCER HISTORY: The patient tells me she herself noted a mass in her right breast and brought it to her primary care physician's attention. However [facility]2016 was bilateral screening mammography with tomography The breast density was category C. In the right breast there was an area of possible distortion and the patient was brought back for right diagnostic mammography with tomography and right breast ultrasonography 04/18/2016. In the right breast lower outer quadrant there was a 1.9 cm spiculated mass. There were faint linear microcalcifications extending anteriorly and posteriorly from the mass. On palpation there was a firm fixed mass in the right breast at the 7:00 position, which on ultrasound measured 2.0 cm, 5 cm from the nipple at the 7:00 position. In the right axilla ultrasound showed 1 abnormal lymph node with asymmetric cortical thickening.  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  Patient's subsequent history is as detailed below  INTERVAL HISTORY: [name] [name] was evaluated in the breast clinic 05/28/2016 accompanied by her daughter [name] [name]. Her case was also presented in the multidisciplinary breast cancer conference 05/07/2016. 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 She is wheelchair-bound but runs her motorized wheelchair very competently. She has significant neuropathy in her feet but does not otherwise hurt. She denies significant headache, sudden visual changes, or nausea or vomiting problems exact when she has reflux issues. She is incontinent of urine but not stool. She denies shortness of breath, cough, or pleurisy. She denies chest pain at present. She is on the constipated side. She is on Plavix but denies any bleeding or unusual bruising. A detailed review of systems today was otherwise noncontributory   PAST MEDICAL HISTORY: Past Medical History:  Diagnosis Date  . Angina   . CHF (congestive heart failure) (HCC)   . Complication of anesthesia    lung   . Diabetes mellitus (HCC)   . GERD (gastroesophageal reflux disease)   . H/O hiatal hernia   . Hypercholesteremia   . Hypertension   . Myocardial infarction    CABG 2001, ? stent. Cath 02/2011 with new distal LIMA-LAD 80%. SVG-OM occlusion is old, rest of grafts patent  . Peripheral vascular disease (HCC)   . Stroke (HCC) 2002 and 2003   Resultant right hemiparesis and aphasia. Communicates by writing     PAST SURGICAL HISTORY: Past Surgical History:  Procedure Laterality Date  . ABDOMINAL HYSTERECTOMY    . BREAST BIOPSY    . BREAST CYST EXCISION Left    years ago per daughter  . CARDIAC CATHETERIZATION  02/2011  . CORONARY   ARTERY BYPASS GRAFT    . ESOPHAGOGASTRODUODENOSCOPY  03/12/2011   Procedure: ESOPHAGOGASTRODUODENOSCOPY (EGD);  Surgeon: [name], MD;  Location: [facility] ENDOSCOPY;  Service: Endoscopy;  Laterality: N/A;  . LEFT HEART CATHETERIZATION WITH CORONARY ANGIOGRAM N/A  03/10/2011   Procedure: LEFT HEART CATHETERIZATION WITH CORONARY ANGIOGRAM;  Surgeon: [name], MD;  Location: [facility] CATH LAB;  Service: Cardiovascular;  Laterality: N/A;    FAMILY HISTORY Family History  Problem Relation Age of Onset  . Heart disease Mother   . Stroke Father   . Diabetes Sister   . Diabetes Brother   . Anesthesia problems Neg Hx   . Hypotension Neg Hx   . Malignant hyperthermia Neg Hx   . Pseudochol deficiency Neg Hx   A stepsister had breast cancer diagnosed in her 90s   GYNECOLOGIC HISTORY:  No LMP recorded. Patient is postmenopausal.  menarche age 84, first live birth age 35., The patient is [place] P2. She underwent remote hysterectomy, she thinks without salpingo-oophorectomy. She did not use hormone replacement.   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 She is widowed and lives independently. Her grandson [name] [name], 93 years old, is there at night. The patient has a nurse who comes during the day to help with activities of daily living. The patient tells me she is able to transfer from chair to toilet and back and from bed to chair and. She communicates by writing. She has 6 grandchildren and one great-grandchild. She attends [employer].     ADVANCED DIRECTIVES:  the patient's healthcare power of attorney is her daughter [name] [name], who can be reached at [phone]. The patient does have a living will in place.    HEALTH MAINTENANCE: Social History  Substance Use Topics  . Smoking status: Never Smoker  . Smokeless tobacco: Never Used  . Alcohol use No     Colonoscopy:  PAP:  Bone density:   Allergies  Allergen Reactions  . Penicillins Swelling    Swelling of mouth Has patient had a PCN reaction causing immediate rash, facial/tongue/throat swelling, SOB or lightheadedness with hypotension: YES Has patient had a PCN reaction causing severe rash involving mucus membranes or skin necrosis: NO Has patient had a PCN reaction that  required hospitalization NO Has patient had a PCN reaction occurring within the last 10 years: NO If all of the above answers are "NO", then may proceed with Cephalosporin use.    Current Outpatient Prescriptions  Medication Sig Dispense Refill  . anastrozole (ARIMIDEX) 1 MG tablet Take 1 tablet (1 mg total) by mouth daily. 90 tablet 4  . clopidogrel (PLAVIX) 75 MG tablet Take 75 mg by mouth daily.      Marland Kitchen escitalopram (LEXAPRO) 20 MG tablet Take 20 mg by mouth daily.    . furosemide (LASIX) 40 MG tablet Take 1 tablet (40 mg total) by mouth daily. 30 tablet 3  . gabapentin (NEURONTIN) 100 MG capsule Take 1 capsule (100 mg total) by mouth 3 (three) times daily. 90 capsule 3  . insulin aspart (NOVOLOG) 100 UNIT/ML injection Inject 4 Units into the skin 3 (three) times daily with meals. 10 mL 11  . insulin glargine (LANTUS) 100 UNIT/ML injection Inject 0.12 mLs (12 Units total) into the skin daily. 10 mL 11  . lisinopril (PRINIVIL,ZESTRIL) 5 MG tablet Take 1 tablet (5 mg total) by mouth daily. 30 tablet 6  . metoprolol tartrate (LOPRESSOR) 25 MG tablet Take 0.5 tablets (12.5 mg total) by mouth  2 (two) times daily. 60 tablet 6  . nitroGLYCERIN (NITROSTAT) 0.4 MG SL tablet Place 0.4 mg under the tongue every 5 (five) minutes as needed for chest pain.    . potassium chloride SA (K-DUR,KLOR-CON) 20 MEQ tablet Take 1 tablet (20 mEq total) by mouth daily. (Patient not taking: Reported on 12/01/2015) 30 tablet 1  . rosuvastatin (CRESTOR) 40 MG tablet Take 40 mg by mouth daily.     No current facility-administered medications for this visit.     OBJECTIVE: Older African-American woman examined in a motorized wheelchair Vitals:   05/28/16 1602  BP: (!) 129/54  Pulse: (!) 59  Resp: 17  Temp: 97.7 F (36.5 C)     There is no height or weight on file to calculate BMI.    ECOG FS:2 - Symptomatic, <50% confined to bed   Ocular: Sclerae unicteric, bilateral arcus and illness Ear-nose-throat:  Oropharynx clear, dentition in poor, drooling Lymphatic: No cervical or supraclavicular adenopathy Lungs no rales or rhonchi Heart regular rate and rhythm Abd soft, nontender, positive bowel sounds MSK no focal spinal tenderness, no right upper extremity lymphedema Neuro: Right hemiparesis, nonverbal but well-oriented, appropriate affect Breasts: There is a movable mass in the right breast, about 2 cm superior to the nipple, not associated with any skin changes or nipple retraction. The right axillary lymph node is barely palpable. The left breast and left axilla are unremarkable  LAB RESULTS:  CMP     Component Value Date/Time   NA 139 12/03/2015 0437   K 4.0 12/03/2015 0437   CL 111 12/03/2015 0437   CO2 22 12/03/2015 0437   GLUCOSE 140 (H) 12/03/2015 0437   BUN 15 12/03/2015 0437   CREATININE 0.98 12/03/2015 0437   CALCIUM 8.7 (L) 12/03/2015 0437   PROT 7.7 12/01/2015 1716   ALBUMIN 4.0 12/01/2015 1716   AST 30 12/01/2015 1716   ALT 15 12/01/2015 1716   ALKPHOS 56 12/01/2015 1716   BILITOT 0.8 12/01/2015 1716   GFRNONAA 55 (L) 12/03/2015 0437   GFRAA >60 12/03/2015 0437    No results found for: TOTALPROTELP, ALBUMINELP, A1GS, A2GS, BETS, BETA2SER, GAMS, MSPIKE, SPEI  No results found for: KPAFRELGTCHN, LAMBDASER, KAPLAMBRATIO  Lab Results  Component Value Date   WBC 5.7 12/03/2015   NEUTROABS 4.5 12/01/2015   HGB 10.0 (L) 12/03/2015   HCT 32.6 (L) 12/03/2015   MCV 88.1 12/03/2015   PLT 129 (L) 12/03/2015      Chemistry      Component Value Date/Time   NA 139 12/03/2015 0437   K 4.0 12/03/2015 0437   CL 111 12/03/2015 0437   CO2 22 12/03/2015 0437   BUN 15 12/03/2015 0437   CREATININE 0.98 12/03/2015 0437      Component Value Date/Time   CALCIUM 8.7 (L) 12/03/2015 0437   ALKPHOS 56 12/01/2015 1716   AST 30 12/01/2015 1716   ALT 15 12/01/2015 1716   BILITOT 0.8 12/01/2015 1716       No results found for: LABCA2  No components found for:  LABCAN125  No results for input(s): INR in the last 168 hours.  Urinalysis    Component Value Date/Time   COLORURINE YELLOW 12/01/2015 1839   APPEARANCEUR CLEAR 12/01/2015 1839   LABSPEC 1.020 12/01/2015 1839   PHURINE 5.5 12/01/2015 1839   GLUCOSEU NEGATIVE 12/01/2015 1839   HGBUR TRACE (A) 12/01/2015 1839   BILIRUBINUR NEGATIVE 12/01/2015 1839   KETONESUR 15 (A) 12/01/2015 1839   PROTEINUR NEGATIVE 12/01/2015 1839     UROBILINOGEN 1.0 07/13/2013 2255   NITRITE NEGATIVE 12/01/2015 1839   LEUKOCYTESUR NEGATIVE 12/01/2015 1839     STUDIES: Outside studies discussed  ELIGIBLE FOR AVAILABLE RESEARCH PROTOCOL: no  ASSESSMENT: 76 y.o. [name] [name] woman status post right breast lower outer quadrant biopsy 04/25/2016 for a clinical T1-T2, N2, stage IIA invasive lobular carcinoma, grade 2, E-cadherin negative, estrogen and progesterone receptor positive, HER-2 nonamplified, with an MIB-1 of 5%  (1) definitive surgery (optimally right mastectomy with targeted axillary dissection) pending  (a) patient is felt to be high surgical risk by cardiology  (2) anastrozole started 05/28/2016    PLAN: We spent the better part of today's hour-long appointment discussing the biology of breast cancer in general, and the specifics of the patient's tumor in particular. We first reviewed the fact that cancer is not one disease but more than 100 different diseases and that it is important to keep them separate-- otherwise when friends and relatives discuss their own cancer experiences with [place] confusion can result. Similarly we explained that if breast cancer spreads to the bone or liver, the patient would not have bone cancer or liver cancer, but breast cancer in the bone and breast cancer in the liver: one cancer in three places-- not 3 different cancers which otherwise would have to be treated in 3 different ways.  We discussed the difference between local and systemic therapy. In terms of  loco-regional treatment, lumpectomy plus radiation is equivalent to mastectomy as far as survival is concerned. For this reason, and because the cosmetic results are generally superior, we recommend breast conserving surgery. We also noted that in terms of sequencing of treatments, whether systemic therapy or surgery is done first does not affect the ultimate outcome.  We then discussed the rationale for systemic therapy. There is some risk that this cancer may have already spread to other parts of her body. Patients frequently ask at this point about bone scans, CAT scans and PET scans to find out if they have occult breast cancer somewhere else. The problem is that in early stage disease we are much more likely to find false positives then true cancers and this would expose the patient to unnecessary procedures as well as unnecessary radiation. Scans cannot answer the question the patient really would like to know, which is whether she has microscopic disease elsewhere in her body. For those reasons we do not recommend them.  Of course we would proceed to aggressive evaluation of any symptoms that might suggest metastatic disease, but that is not the case here.  Next we went over the options for systemic therapy which are anti-estrogens, anti-HER-2 immunotherapy, and chemotherapy. [name] [name] does not meet criteria for anti-HER-2 immunotherapy. She is a good candidate for anti-estrogens.  The question of chemotherapy is more complicated. Chemotherapy is most effective in rapidly growing, very aggressive tumors. It is much less effective in not very aggressive slow growing cancers, like [place] 's. For that reason and taking into account the patient's comorbidities and functional status my expectation is that the chemotherapy would be of little benefit to the patient and it is not recommended in this case.  We discussed the fact that lobular breast cancers are very difficult to image by mammography and  ultrasonography. In addition to the 2 cm mass that was measured ultrasonographically there was abnormal tissue extending both anteriorly and posteriorly from this mass on the mammogram. I think if we did a lumpectomy we likely would end up with positive margins. A mastectomy would be  preferable, coupled with a targeted axillary dissection.  However the patient is felt to be high risk for surgery by cardiology. It may be a better strategy to start with anti-estrogens, and shrink the tumor as much as possible as long as possible. In some cases surgery is never necessary. In other cases the cancer starts growing again after. If shrinkage but then the surgery would be easier because the cancer has shrunk.  The patient has a good understanding of this plan. We discussed the possible toxicities, side effects and complications of anastrozole and I went ahead and placed the prescription in for her. She will started today. She will see me again in approximately 6 weeks to make sure she is tolerating it well and 3 months from now we will repeat a breast ultrasound to see if we can measure any change in the tumor  [name] [name] has a good understanding of the overall plan. She agrees with it. She knows the goal of treatment at this point is control. Her daughterll call with any problems that may develop before her next visit here.  [name], MD   05/28/2016 5:08 PM Medical Oncology and Hematology [facility] [address] [place], [place] [address] Tel. [phone]    Fax. [phone]

## 2016-05-29 ENCOUNTER — Telehealth: Payer: Self-pay | Admitting: *Deleted

## 2016-05-29 NOTE — Telephone Encounter (Signed)
Spoke to pt daughter [name]). Gave navigation resources and contact information. Scheduled and confirmed appt with Dr. Jana Hakim on 5/29. Denies further questions or needs at this time.

## 2016-06-07 ENCOUNTER — Emergency Department (HOSPITAL_COMMUNITY): Payer: Medicare HMO

## 2016-06-07 ENCOUNTER — Emergency Department (HOSPITAL_COMMUNITY)
Admission: EM | Admit: 2016-06-07 | Discharge: 2016-06-07 | Disposition: A | Discharge status: Home / Self Care | Payer: Medicare HMO | Attending: Emergency Medicine | Admitting: Emergency Medicine

## 2016-06-07 DIAGNOSIS — Z951 Presence of aortocoronary bypass graft: Secondary | ICD-10-CM | POA: Insufficient documentation

## 2016-06-07 DIAGNOSIS — Z853 Personal history of malignant neoplasm of breast: Secondary | ICD-10-CM | POA: Diagnosis not present

## 2016-06-07 DIAGNOSIS — I110 Hypertensive heart disease with heart failure: Secondary | ICD-10-CM | POA: Diagnosis not present

## 2016-06-07 DIAGNOSIS — I252 Old myocardial infarction: Secondary | ICD-10-CM | POA: Diagnosis not present

## 2016-06-07 DIAGNOSIS — E162 Hypoglycemia, unspecified: Secondary | ICD-10-CM

## 2016-06-07 DIAGNOSIS — E11649 Type 2 diabetes mellitus with hypoglycemia without coma: Secondary | ICD-10-CM | POA: Insufficient documentation

## 2016-06-07 DIAGNOSIS — Z8673 Personal history of transient ischemic attack (TIA), and cerebral infarction without residual deficits: Secondary | ICD-10-CM | POA: Insufficient documentation

## 2016-06-07 DIAGNOSIS — I2510 Atherosclerotic heart disease of native coronary artery without angina pectoris: Secondary | ICD-10-CM | POA: Insufficient documentation

## 2016-06-07 DIAGNOSIS — Z794 Long term (current) use of insulin: Secondary | ICD-10-CM | POA: Diagnosis not present

## 2016-06-07 DIAGNOSIS — I5042 Chronic combined systolic (congestive) and diastolic (congestive) heart failure: Secondary | ICD-10-CM | POA: Insufficient documentation

## 2016-06-07 LAB — COMPREHENSIVE METABOLIC PANEL
ALBUMIN: 3.6 g/dL (ref 3.5–5.0)
ALK PHOS: 54 U/L (ref 38–126)
ALT: 12 U/L — ABNORMAL LOW (ref 14–54)
ANION GAP: 11 (ref 5–15)
AST: 27 U/L (ref 15–41)
BUN: 14 mg/dL (ref 6–20)
CHLORIDE: 104 mmol/L (ref 101–111)
CO2: 25 mmol/L (ref 22–32)
Calcium: 9.4 mg/dL (ref 8.9–10.3)
Creatinine, Ser: 0.77 mg/dL (ref 0.44–1.00)
GFR calc non Af Amer: >60 mL/min (ref >60)
GLUCOSE: 113 mg/dL — AB (ref 65–99)
POTASSIUM: 3.7 mmol/L (ref 3.5–5.1)
Sodium: 140 mmol/L (ref 135–145)
Total Bilirubin: 0.6 mg/dL (ref 0.3–1.2)
Total Protein: 7.3 g/dL (ref 6.5–8.1)

## 2016-06-07 LAB — CBG MONITORING, ED
GLUCOSE-CAPILLARY: 111 mg/dL — AB (ref 65–99)
GLUCOSE-CAPILLARY: 70 mg/dL (ref 65–99)
Glucose-Capillary: 218 mg/dL — ABNORMAL HIGH (ref 65–99)

## 2016-06-07 LAB — CBC
HEMATOCRIT: 36.8 % (ref 36.0–46.0)
Hemoglobin: 11.5 g/dL — ABNORMAL LOW (ref 12.0–15.0)
MCH: 26.8 pg (ref 26.0–34.0)
MCHC: 31.3 g/dL (ref 30.0–36.0)
MCV: 85.8 fL (ref 78.0–100.0)
PLATELETS: 173 10*3/uL (ref 150–400)
RBC: 4.29 MIL/uL (ref 3.87–5.11)
RDW: 16.5 % — AB (ref 11.5–15.5)
WBC: 5.5 10*3/uL (ref 4.0–10.5)

## 2016-06-07 LAB — URINALYSIS, ROUTINE W REFLEX MICROSCOPIC
Bilirubin Urine: NEGATIVE
GLUCOSE, UA: 150 mg/dL — AB
Hgb urine dipstick: NEGATIVE
KETONES UR: 5 mg/dL — AB
LEUKOCYTES UA: NEGATIVE
NITRITE: NEGATIVE
PH: 5 (ref 5.0–8.0)
Protein, ur: NEGATIVE mg/dL
SPECIFIC GRAVITY, URINE: 1.023 (ref 1.005–1.030)

## 2016-06-07 NOTE — ED Notes (Addendum)
Pt eating a sand which bag and gingerale, apple sauce and peanut butter graham crackers.

## 2016-06-07 NOTE — ED Notes (Signed)
Pt given [place] sandwich, orange juice and grahm crackers. Family at bedside to help pt.

## 2016-06-07 NOTE — ED Provider Notes (Signed)
[facility] DEPT Provider Note   CSN: [id number] Arrival date & time: 06/07/16  1710     History   Chief Complaint Chief Complaint  Patient presents with  . Hypoglycemia    HPI [name] [name] is a 76 y.o. female.  Pt was found by family slumped over.  The pt sometimes has trouble eating due to prior stroke.  Her grandson said that she did not eat much today.  She did take her regular insulin.  Pt is nonverbal from prior stroke and is unable to give any hx.  Family has not noticed anything unusual lately.  EMS reports BS of 18.  They gave her D10 and bs is normal.  Pt is at her normal mental status.      Past Medical History:  Diagnosis Date  . Angina   . CHF (congestive heart failure) ([place])   . Complication of anesthesia    lung   . Diabetes mellitus ([place])   . GERD (gastroesophageal reflux disease)   . H/O hiatal hernia   . Hypercholesteremia   . Hypertension   . Myocardial infarction    CABG 2001, ? stent. Cath 02/2011 with new distal LIMA-LAD 80%. SVG-OM occlusion is old, rest of grafts patent  . Peripheral vascular disease ([place])   . Stroke [facility]) 2002 and 2003   Resultant right hemiparesis and aphasia. Communicates by writing     Patient Active Problem List   Diagnosis Date Noted  . Malignant neoplasm of lower-outer quadrant of right breast of female, estrogen receptor positive ([place]) 05/28/2016  . Chronic combined systolic and diastolic CHF (congestive heart failure) ([place]) 12/01/2015  . Acute encephalopathy 12/01/2015  . Hypoglycemia 12/01/2015  . Elevated troponin 12/01/2015  . Diabetes mellitus without complication ([place])   . Hypothermia   . Rhonchi   . Acute respiratory failure ([place]) 01/28/2014  . Respiratory distress 01/27/2014  . Hypoxia 01/27/2014  . CHF exacerbation ([place]) 01/27/2014  . Acute on chronic systolic CHF (congestive heart failure) ([place]) 01/27/2014  . Systolic CHF, acute on chronic ([place]) 01/27/2014  . Increased anion gap metabolic acidosis  57/02/7791  . Right foot ulcer ([place]) 01/27/2014  . Aspiration pneumonia ([place]) 06/23/2012  . Dysphagia 06/21/2012  . Palliative care encounter 06/20/2012  . Healthcare-associated pneumonia 06/16/2012  . TIA (transient ischemic attack) 05/05/2012  . N&V (nausea and vomiting) 05/04/2012  . Weakness generalized 05/04/2012  . 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 NSTEMI (non-ST elevated myocardial infarction) ([place]) 07/27/2011  . Diabetes mellitus ([place])   . Chest pain 03/10/2011  . HTN (hypertension) 03/10/2011  . Cerebral infarction ([place]) 03/10/2011  . CAD (coronary artery disease), native coronary artery 03/10/2011  . 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 S/P PTCA (percutaneous transluminal coronary angioplasty) 03/10/2011    Past Surgical History:  Procedure Laterality Date  . ABDOMINAL HYSTERECTOMY    . BREAST BIOPSY    . BREAST CYST EXCISION Left    years ago per daughter  . CARDIAC CATHETERIZATION  02/2011  . CORONARY ARTERY BYPASS GRAFT    . ESOPHAGOGASTRODUODENOSCOPY  03/12/2011   Procedure: ESOPHAGOGASTRODUODENOSCOPY (EGD);  Surgeon: [name], MD;  Location: [facility] ENDOSCOPY;  Service: Endoscopy;  Laterality: N/A;  . LEFT HEART CATHETERIZATION WITH CORONARY ANGIOGRAM N/A 03/10/2011   Procedure: LEFT HEART CATHETERIZATION WITH CORONARY ANGIOGRAM;  Surgeon: [name], MD;  Location: [facility] CATH LAB;  Service: Cardiovascular;  Laterality: N/A;    OB History    No  data available       Home Medications    Prior to Admission medications   Medication Sig Start Date End Date Taking? Authorizing Provider  anastrozole (ARIMIDEX) 1 MG tablet Take 1 tablet (1 mg total) by mouth daily. 05/28/16  Yes [name], MD  clopidogrel (PLAVIX) 75 MG tablet Take 75 mg by mouth daily.     Yes Historical Provider, MD  escitalopram (LEXAPRO) 20 MG tablet Take 20 mg by mouth daily.   Yes Historical Provider, MD  furosemide  (LASIX) 40 MG tablet Take 1 tablet (40 mg total) by mouth daily. 01/28/14  Yes Ripudeep Krystal Eaton, MD  gabapentin (NEURONTIN) 100 MG capsule Take 1 capsule (100 mg total) by mouth 3 (three) times daily. 01/28/14  Yes Ripudeep Krystal Eaton, MD  insulin aspart (NOVOLOG) 100 UNIT/ML injection Inject 4 Units into the skin 3 (three) times daily with meals. Patient taking differently: Inject 11 Units into the skin 3 (three) times daily with meals.  12/04/15  Yes Silver Huguenin Elgergawy, MD  insulin glargine (LANTUS) 100 UNIT/ML injection Inject 0.12 mLs (12 Units total) into the skin daily. Patient taking differently: Inject 11 Units into the skin daily.  12/04/15  Yes Silver Huguenin Elgergawy, MD  lisinopril (PRINIVIL,ZESTRIL) 5 MG tablet Take 1 tablet (5 mg total) by mouth daily. 12/04/15  Yes Albertine Patricia, MD  nitroGLYCERIN (NITROSTAT) 0.4 MG SL tablet Place 0.4 mg under the tongue every 5 (five) minutes as needed for chest pain.   Yes Historical Provider, MD  potassium chloride SA (K-DUR,KLOR-CON) 20 MEQ tablet Take 1 tablet (20 mEq total) by mouth daily. 07/20/13  Yes [name] Prows, MD  rosuvastatin (CRESTOR) 40 MG tablet Take 40 mg by mouth daily.   Yes Historical Provider, MD  metoprolol tartrate (LOPRESSOR) 25 MG tablet Take 0.5 tablets (12.5 mg total) by mouth 2 (two) times daily. Patient not taking: Reported on 06/07/2016 12/04/15   Albertine Patricia, MD    Family History Family History  Problem Relation Age of Onset  . Heart disease Mother   . Stroke Father   . Diabetes Sister   . Diabetes Brother   . Anesthesia problems Neg Hx   . Hypotension Neg Hx   . Malignant hyperthermia Neg Hx   . Pseudochol deficiency Neg Hx     Social History Social History  Substance Use Topics  . Smoking status: Never Smoker  . Smokeless tobacco: Never Used  . Alcohol use No     Allergies   Penicillins   Review of Systems Review of Systems  Unable to perform ROS: Patient nonverbal     Physical Exam Updated  Vital Signs BP 110/72   Pulse 76   Resp 17   SpO2 100%   Physical Exam  Constitutional: She appears well-developed and well-nourished.  HENT:  Head: Normocephalic and atraumatic.  Right Ear: External ear normal.  Left Ear: External ear normal.  Nose: Nose normal.  Mouth/Throat: Oropharynx is clear and moist.  Eyes: Conjunctivae and EOM are normal. Pupils are equal, round, and reactive to light.  Neck: Normal range of motion. Neck supple.  Cardiovascular: Normal rate, regular rhythm, normal heart sounds and intact distal pulses.   Pulmonary/Chest: Effort normal and breath sounds normal.  Abdominal: Soft. Bowel sounds are normal.  Musculoskeletal: Normal range of motion.  Neurological: She is alert.  Right sided paralysis and nonverbal state from prior CVA  Skin: Skin is warm.  Psychiatric: She has a normal mood and affect.  Nursing note and vitals reviewed.    ED Treatments / Results  Labs (all labs ordered are listed, but only abnormal results are displayed) Labs Reviewed  CBC - Abnormal; Notable for the following:       Result Value   Hemoglobin 11.5 (*)    RDW 16.5 (*)    All other components within normal limits  COMPREHENSIVE METABOLIC PANEL - Abnormal; Notable for the following:    Glucose, Bld 113 (*)    ALT 12 (*)    All other components within normal limits  URINALYSIS, ROUTINE W REFLEX MICROSCOPIC - Abnormal; Notable for the following:    Glucose, UA 150 (*)    Ketones, ur 5 (*)    All other components within normal limits  CBG MONITORING, ED - Abnormal; Notable for the following:    Glucose-Capillary 111 (*)    All other components within normal limits  CBG MONITORING, ED - Abnormal; Notable for the following:    Glucose-Capillary 218 (*)    All other components within normal limits  CBG MONITORING, ED  CBG MONITORING, ED    EKG  EKG Interpretation  Date/Time:  Saturday June 07 2016 17:46:25 EDT Ventricular Rate:  52 PR Interval:    QRS  Duration: 118 QT Interval:  547 QTC Calculation: 509 R Axis:   22 Text Interpretation:  Sinus rhythm Atrial premature complex Left atrial enlargement Incomplete left bundle branch block LVH with secondary repolarization abnormality Prolonged QT interval No significant change since last tracing Confirmed by [facility] MD, Nadiah Corbit ([id number]) on 06/07/2016 6:41:46 PM       Radiology Dg Chest Port 1 View  Result Date: 06/07/2016 CLINICAL DATA:  Hypoglycemia EXAM: PORTABLE CHEST 1 VIEW COMPARISON:  12/01/2015 FINDINGS: Cardiac shadow is mildly enlarged. Postsurgical changes are again seen. The lungs are well aerated bilaterally. No focal infiltrate or sizable effusion is seen. No bony abnormality is noted. IMPRESSION: No active disease. Electronically Signed   By: Inez Catalina M.D.   On: 06/07/2016 18:07    Procedures Procedures (including critical care time)  Medications Ordered in ED Medications - No data to display   Initial Impression / Assessment and Plan / ED Course  I have reviewed the triage vital signs and the nursing notes.  Pertinent labs & imaging results that were available during my care of the patient were reviewed by me and considered in my medical decision making (see chart for details).     Pt's blood sugar has remained stable here.  She has eaten.  She and family are comfortable with d/c.  Pt knows to eat small, frequent meals high in protein.  Return if worse.  Final Clinical Impressions(s) / ED Diagnoses   Final diagnoses:  Hypoglycemia    New Prescriptions New Prescriptions   No medications on file     Isla Pence, MD 06/07/16 2120

## 2016-06-07 NOTE — ED Notes (Addendum)
Family at bedside. 

## 2016-06-07 NOTE — ED Triage Notes (Signed)
Per EMS- pt non verbal from prior stroke. Pt also has bilateral weakness per norm. PT son reports gurgling sound the pt is making is her normal.  PT found unresponsive. Pt takes same dose of insulin daily, gives it to herself. Pt CBG was 18 upon EMS arrival. Given 25 dextrose by EMS. Repeat 180. CBG 111 upon arrival. 20G PIv to [place].

## 2016-06-17 ENCOUNTER — Ambulatory Visit: Payer: Medicare HMO | Admitting: Sports Medicine

## 2016-06-24 ENCOUNTER — Encounter: Payer: Self-pay | Admitting: Sports Medicine

## 2016-06-24 ENCOUNTER — Ambulatory Visit (INDEPENDENT_AMBULATORY_CARE_PROVIDER_SITE_OTHER): Payer: Medicare HMO | Admitting: Sports Medicine

## 2016-06-24 DIAGNOSIS — B351 Tinea unguium: Secondary | ICD-10-CM

## 2016-06-24 DIAGNOSIS — E1142 Type 2 diabetes mellitus with diabetic polyneuropathy: Secondary | ICD-10-CM | POA: Diagnosis not present

## 2016-06-24 DIAGNOSIS — I739 Peripheral vascular disease, unspecified: Secondary | ICD-10-CM

## 2016-06-24 DIAGNOSIS — I70223 Atherosclerosis of native arteries of extremities with rest pain, bilateral legs: Secondary | ICD-10-CM

## 2016-06-24 DIAGNOSIS — M79671 Pain in right foot: Secondary | ICD-10-CM

## 2016-06-24 DIAGNOSIS — M79672 Pain in left foot: Secondary | ICD-10-CM

## 2016-06-24 MED ORDER — LIDOCAINE 5 % EX OINT: 1.0000 "application " | TOPICAL_OINTMENT | Freq: Two times a day (BID) | CUTANEOUS | 1 refills | Status: DC | PRN

## 2016-06-24 NOTE — Progress Notes (Signed)
Subjective: [name] [name] is a 76 y.o. female patient with history of diabetes who returns to office today complaining of long, painful nails; unable to trim. Patient states that the glucose reading this morning was "good" and is assisted by daughter. Patient denies any new changes in medication or new problems. Patient denies any new cramping, numbness, burning or tingling in the legs. Tingling and swelling in legs unchanged from prior with right worse than left with pain with positions when sleeping/elevation unchanged; PCP increased gabapentin and may consider giving her more and has tried aspercreme with a little improvement.   Patient Active Problem List   Diagnosis Date Noted  . Malignant neoplasm of lower-outer quadrant of right breast of female, estrogen receptor positive ([place]) 05/28/2016  . Chronic combined systolic and diastolic CHF (congestive heart failure) ([place]) 12/01/2015  . Acute encephalopathy 12/01/2015  . Hypoglycemia 12/01/2015  . Elevated troponin 12/01/2015  . Diabetes mellitus without complication ([place])   . Hypothermia   . Rhonchi   . Acute respiratory failure ([place]) 01/28/2014  . Respiratory distress 01/27/2014  . Hypoxia 01/27/2014  . CHF exacerbation ([place]) 01/27/2014  . Acute on chronic systolic CHF (congestive heart failure) ([place]) 01/27/2014  . Systolic CHF, acute on chronic ([place]) 01/27/2014  . Increased anion gap metabolic acidosis 62/22/9798  . Right foot ulcer ([place]) 01/27/2014  . Aspiration pneumonia ([place]) 06/23/2012  . Dysphagia 06/21/2012  . Palliative care encounter 06/20/2012  . Healthcare-associated pneumonia 06/16/2012  . TIA (transient ischemic attack) 05/05/2012  . N&V (nausea and vomiting) 05/04/2012  . Weakness generalized 05/04/2012  . 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 NSTEMI (non-ST elevated myocardial infarction) ([place]) 07/27/2011  . Diabetes mellitus ([place])   . Chest pain  03/10/2011  . HTN (hypertension) 03/10/2011  . Cerebral infarction ([place]) 03/10/2011  . CAD (coronary artery disease), native coronary artery 03/10/2011  . 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 S/P PTCA (percutaneous transluminal coronary angioplasty) 03/10/2011   Current Outpatient Prescriptions on File Prior to Visit  Medication Sig Dispense Refill  . anastrozole (ARIMIDEX) 1 MG tablet Take 1 tablet (1 mg total) by mouth daily. 90 tablet 4  . clopidogrel (PLAVIX) 75 MG tablet Take 75 mg by mouth daily.      Marland Kitchen escitalopram (LEXAPRO) 20 MG tablet Take 20 mg by mouth daily.    . furosemide (LASIX) 40 MG tablet Take 1 tablet (40 mg total) by mouth daily. 30 tablet 3  . gabapentin (NEURONTIN) 100 MG capsule Take 1 capsule (100 mg total) by mouth 3 (three) times daily. 90 capsule 3  . insulin aspart (NOVOLOG) 100 UNIT/ML injection Inject 4 Units into the skin 3 (three) times daily with meals. (Patient taking differently: Inject 11 Units into the skin 3 (three) times daily with meals. ) 10 mL 11  . insulin glargine (LANTUS) 100 UNIT/ML injection Inject 0.12 mLs (12 Units total) into the skin daily. (Patient taking differently: Inject 11 Units into the skin daily. ) 10 mL 11  . lisinopril (PRINIVIL,ZESTRIL) 5 MG tablet Take 1 tablet (5 mg total) by mouth daily. 30 tablet 6  . metoprolol tartrate (LOPRESSOR) 25 MG tablet Take 0.5 tablets (12.5 mg total) by mouth 2 (two) times daily. 60 tablet 6  . nitroGLYCERIN (NITROSTAT) 0.4 MG SL tablet Place 0.4 mg under the tongue every 5 (five) minutes as needed for chest pain.    . potassium chloride SA (K-DUR,KLOR-CON) 20 MEQ tablet Take 1 tablet (20  mEq total) by mouth daily. 30 tablet 1  . rosuvastatin (CRESTOR) 40 MG tablet Take 40 mg by mouth daily.     No current facility-administered medications on file prior to visit.    Allergies  Allergen Reactions  . Penicillins Swelling    Swelling of mouth Has patient had a PCN reaction causing immediate rash,  facial/tongue/throat swelling, SOB or lightheadedness with hypotension: YES Has patient had a PCN reaction causing severe rash involving mucus membranes or skin necrosis: NO Has patient had a PCN reaction that required hospitalization NO Has patient had a PCN reaction occurring within the last 10 years: NO If all of the above answers are "NO", then may proceed with Cephalosporin use.    Recent Results (from the past 2160 hour(s))  CBG monitoring, ED     Status: Abnormal   Collection Time: 06/07/16  5:25 PM  Result Value Ref Range   Glucose-Capillary 111 (H) 65 - 99 mg/dL  CBC     Status: Abnormal   Collection Time: 06/07/16  5:27 PM  Result Value Ref Range   WBC 5.5 4.0 - 10.5 K/uL   RBC 4.29 3.87 - 5.11 MIL/uL   Hemoglobin 11.5 (L) 12.0 - 15.0 g/dL   HCT 36.8 36.0 - 46.0 %   MCV 85.8 78.0 - 100.0 fL   MCH 26.8 26.0 - 34.0 pg   MCHC 31.3 30.0 - 36.0 g/dL   RDW 16.5 (H) 11.5 - 15.5 %   Platelets 173 150 - 400 K/uL  Comprehensive metabolic panel     Status: Abnormal   Collection Time: 06/07/16  5:27 PM  Result Value Ref Range   Sodium 140 135 - 145 mmol/L   Potassium 3.7 3.5 - 5.1 mmol/L   Chloride 104 101 - 111 mmol/L   CO2 25 22 - 32 mmol/L   Glucose, Bld 113 (H) 65 - 99 mg/dL   BUN 14 6 - 20 mg/dL   Creatinine, Ser 0.77 0.44 - 1.00 mg/dL   Calcium 9.4 8.9 - 10.3 mg/dL   Total Protein 7.3 6.5 - 8.1 g/dL   Albumin 3.6 3.5 - 5.0 g/dL   AST 27 15 - 41 U/L   ALT 12 (L) 14 - 54 U/L   Alkaline Phosphatase 54 38 - 126 U/L   Total Bilirubin 0.6 0.3 - 1.2 mg/dL   GFR calc non Af Amer >60 >60 mL/min   GFR calc Af Amer >60 >60 mL/min    Comment: (NOTE) The eGFR has been calculated using the CKD EPI equation. This calculation has not been validated in all clinical situations. eGFR's persistently <60 mL/min signify possible Chronic Kidney Disease.    Anion gap 11 5 - 15  CBG monitoring, ED (now and then every hour for 3 hours)     Status: None   Collection Time: 06/07/16  6:16  PM  Result Value Ref Range   Glucose-Capillary 70 65 - 99 mg/dL  CBG monitoring, ED (now and then every hour for 3 hours)     Status: Abnormal   Collection Time: 06/07/16  8:15 PM  Result Value Ref Range   Glucose-Capillary 218 (H) 65 - 99 mg/dL  Urinalysis, Routine w reflex microscopic     Status: Abnormal   Collection Time: 06/07/16  8:25 PM  Result Value Ref Range   Color, Urine YELLOW YELLOW   APPearance CLEAR CLEAR   Specific Gravity, Urine 1.023 1.005 - 1.030   pH 5.0 5.0 - 8.0   Glucose, UA 150 (  A) NEGATIVE mg/dL   Hgb urine dipstick NEGATIVE NEGATIVE   Bilirubin Urine NEGATIVE NEGATIVE   Ketones, ur 5 (A) NEGATIVE mg/dL   Protein, ur NEGATIVE NEGATIVE mg/dL   Nitrite NEGATIVE NEGATIVE   Leukocytes, UA NEGATIVE NEGATIVE    Objective: General: Patient is awake, alert, and oriented x 3 and in no acute distress.Limited speech secondary to stroke with Wheelchair assisted gait  Integument: Skin is warm, dry and supple bilateral. Nails are tender, long, thickened and dystrophic with subungual debris, consistent with onychomycosis, 1-5 bilateral. No signs of infection. No open lesions or preulcerative lesions present bilateral. Minimal callus 5th toes bilateral. Old burn well healed dorsum of right foot and dry callus heel bilateral. Remaining integument unremarkable.  Vasculature:  Dorsalis Pedis pulse 1/4 bilateral. Posterior Tibial pulse  0/4 bilateral. Capillary fill time <5 sec 1-5 bilateral. No hair growth to the level of the digits.Temperature gradient within normal limits. No varicosities present bilateral. +1 edema present bilateral R>L.   Neurology: The patient has absent sensation measured with a 5.07/10g Semmes Weinstein Monofilament at all pedal sites bilateral . Vibratory sensation absent bilateral with tuning fork. No Babinski sign present bilateral. Subjective tingling bilateral.   Musculoskeletal: Asymptomatic hammertoe pedal deformities noted bilateral. Pain with  light touch bilateral right>left foot and ankle. Muscular strength 4/5 on left 3/5 on right. No tenderness with calf compression bilateral.  Assessment and Plan: Problem List Items Addressed This Visit    None    Visit Diagnoses    Dermatophytosis of nail    -  Primary   PVD (peripheral vascular disease) ([place])       Foot pain, bilateral       Relevant Medications   lidocaine (XYLOCAINE) 5 % ointment   Diabetic polyneuropathy associated with type 2 diabetes mellitus (HCC)       Atherosclerosis of native artery of both lower extremities with rest pain ([place])          -Examined patient. -Discussed and educated patient on diabetic foot care, especially with  regards to the vascular, neurological and musculoskeletal systems.  -Stressed the importance of good glycemic control and the detriment of not controlling glucose levels in relation to the foot. -Mechanically debrided all nails 1-5 bilateral using sterile nail nipper and filed with dremel without incident  -Gave daughter diabetic shoe paperwork to get PCP to sign it; Was previously molded  -Follow up vascular studies for rest pain  -Recommend follow up with PCP for lasix adjustment and for possible increase in Gabapentin  -Rx lidocaine for pain  -Answered all patient questions -Patient to return  in 3 months for at risk foot care -Patient advised to call the office if any problems or questions arise in the meantime.  Landis Martins, DPM

## 2016-07-12 ENCOUNTER — Emergency Department (HOSPITAL_COMMUNITY): Payer: Medicare HMO

## 2016-07-12 ENCOUNTER — Inpatient Hospital Stay (HOSPITAL_COMMUNITY)
Admission: EM | Admit: 2016-07-12 | Discharge: 2016-07-17 | DRG: 292 | Disposition: A | Discharge status: Home Health | Payer: Medicare HMO | Attending: Internal Medicine | Admitting: Internal Medicine

## 2016-07-12 ENCOUNTER — Encounter (HOSPITAL_COMMUNITY): Payer: Self-pay

## 2016-07-12 DIAGNOSIS — Z66 Do not resuscitate: Secondary | ICD-10-CM | POA: Diagnosis present

## 2016-07-12 DIAGNOSIS — Z794 Long term (current) use of insulin: Secondary | ICD-10-CM | POA: Diagnosis not present

## 2016-07-12 DIAGNOSIS — I5042 Chronic combined systolic (congestive) and diastolic (congestive) heart failure: Secondary | ICD-10-CM

## 2016-07-12 DIAGNOSIS — I69151 Hemiplegia and hemiparesis following nontraumatic intracerebral hemorrhage affecting right dominant side: Secondary | ICD-10-CM | POA: Diagnosis not present

## 2016-07-12 DIAGNOSIS — E1165 Type 2 diabetes mellitus with hyperglycemia: Secondary | ICD-10-CM | POA: Diagnosis present

## 2016-07-12 DIAGNOSIS — E7800 Pure hypercholesterolemia, unspecified: Secondary | ICD-10-CM | POA: Diagnosis present

## 2016-07-12 DIAGNOSIS — I10 Essential (primary) hypertension: Secondary | ICD-10-CM | POA: Diagnosis not present

## 2016-07-12 DIAGNOSIS — I69359 Hemiplegia and hemiparesis following cerebral infarction affecting unspecified side: Secondary | ICD-10-CM

## 2016-07-12 DIAGNOSIS — L97311 Non-pressure chronic ulcer of right ankle limited to breakdown of skin: Secondary | ICD-10-CM | POA: Diagnosis present

## 2016-07-12 DIAGNOSIS — E785 Hyperlipidemia, unspecified: Secondary | ICD-10-CM | POA: Diagnosis present

## 2016-07-12 DIAGNOSIS — Z79899 Other long term (current) drug therapy: Secondary | ICD-10-CM | POA: Diagnosis not present

## 2016-07-12 DIAGNOSIS — I5033 Acute on chronic diastolic (congestive) heart failure: Secondary | ICD-10-CM | POA: Diagnosis present

## 2016-07-12 DIAGNOSIS — R531 Weakness: Secondary | ICD-10-CM

## 2016-07-12 DIAGNOSIS — I110 Hypertensive heart disease with heart failure: Secondary | ICD-10-CM | POA: Diagnosis present

## 2016-07-12 DIAGNOSIS — I69120 Aphasia following nontraumatic intracerebral hemorrhage: Secondary | ICD-10-CM | POA: Diagnosis not present

## 2016-07-12 DIAGNOSIS — L97511 Non-pressure chronic ulcer of other part of right foot limited to breakdown of skin: Secondary | ICD-10-CM

## 2016-07-12 DIAGNOSIS — I5021 Acute systolic (congestive) heart failure: Secondary | ICD-10-CM

## 2016-07-12 DIAGNOSIS — I2510 Atherosclerotic heart disease of native coronary artery without angina pectoris: Secondary | ICD-10-CM | POA: Diagnosis present

## 2016-07-12 DIAGNOSIS — E11649 Type 2 diabetes mellitus with hypoglycemia without coma: Secondary | ICD-10-CM | POA: Diagnosis not present

## 2016-07-12 DIAGNOSIS — D638 Anemia in other chronic diseases classified elsewhere: Secondary | ICD-10-CM | POA: Diagnosis present

## 2016-07-12 DIAGNOSIS — E119 Type 2 diabetes mellitus without complications: Secondary | ICD-10-CM

## 2016-07-12 DIAGNOSIS — Z833 Family history of diabetes mellitus: Secondary | ICD-10-CM | POA: Diagnosis not present

## 2016-07-12 DIAGNOSIS — I69328 Other speech and language deficits following cerebral infarction: Secondary | ICD-10-CM | POA: Diagnosis not present

## 2016-07-12 DIAGNOSIS — I5030 Unspecified diastolic (congestive) heart failure: Secondary | ICD-10-CM | POA: Diagnosis present

## 2016-07-12 DIAGNOSIS — E440 Moderate protein-calorie malnutrition: Secondary | ICD-10-CM | POA: Diagnosis present

## 2016-07-12 DIAGNOSIS — Z8249 Family history of ischemic heart disease and other diseases of the circulatory system: Secondary | ICD-10-CM

## 2016-07-12 DIAGNOSIS — Z7902 Long term (current) use of antithrombotics/antiplatelets: Secondary | ICD-10-CM

## 2016-07-12 DIAGNOSIS — E0865 Diabetes mellitus due to underlying condition with hyperglycemia: Secondary | ICD-10-CM | POA: Diagnosis not present

## 2016-07-12 DIAGNOSIS — Z823 Family history of stroke: Secondary | ICD-10-CM

## 2016-07-12 DIAGNOSIS — E11622 Type 2 diabetes mellitus with other skin ulcer: Secondary | ICD-10-CM | POA: Diagnosis present

## 2016-07-12 LAB — CBC WITH DIFFERENTIAL/PLATELET
BASOS ABS: 0 10*3/uL (ref 0.0–0.1)
Basophils Relative: 1 %
Eosinophils Absolute: 0.1 10*3/uL (ref 0.0–0.7)
Eosinophils Relative: 3 %
HEMATOCRIT: 33.7 % — AB (ref 36.0–46.0)
Hemoglobin: 10.1 g/dL — ABNORMAL LOW (ref 12.0–15.0)
LYMPHS ABS: 1.1 10*3/uL (ref 0.7–4.0)
LYMPHS PCT: 27 %
MCH: 26.4 pg (ref 26.0–34.0)
MCHC: 30 g/dL (ref 30.0–36.0)
MCV: 88 fL (ref 78.0–100.0)
Monocytes Absolute: 0.4 10*3/uL (ref 0.1–1.0)
Monocytes Relative: 11 %
NEUTROS ABS: 2.3 10*3/uL (ref 1.7–7.7)
Neutrophils Relative %: 58 %
Platelets: 187 10*3/uL (ref 150–400)
RBC: 3.83 MIL/uL — AB (ref 3.87–5.11)
RDW: 16.3 % — ABNORMAL HIGH (ref 11.5–15.5)
WBC: 3.9 10*3/uL — ABNORMAL LOW (ref 4.0–10.5)

## 2016-07-12 LAB — BRAIN NATRIURETIC PEPTIDE: B NATRIURETIC PEPTIDE 5: 810.5 pg/mL — AB (ref 0.0–100.0)

## 2016-07-12 LAB — COMPREHENSIVE METABOLIC PANEL
ALBUMIN: 3.5 g/dL (ref 3.5–5.0)
ALT: 17 U/L (ref 14–54)
ANION GAP: 6 (ref 5–15)
AST: 21 U/L (ref 15–41)
Alkaline Phosphatase: 68 U/L (ref 38–126)
BUN: 9 mg/dL (ref 6–20)
CHLORIDE: 107 mmol/L (ref 101–111)
CO2: 26 mmol/L (ref 22–32)
Calcium: 9.1 mg/dL (ref 8.9–10.3)
Creatinine, Ser: 0.74 mg/dL (ref 0.44–1.00)
GFR calc Af Amer: >60 mL/min (ref >60)
GFR calc non Af Amer: >60 mL/min (ref >60)
GLUCOSE: 263 mg/dL — AB (ref 65–99)
POTASSIUM: 4.1 mmol/L (ref 3.5–5.1)
SODIUM: 139 mmol/L (ref 135–145)
TOTAL PROTEIN: 6.9 g/dL (ref 6.5–8.1)
Total Bilirubin: 0.7 mg/dL (ref 0.3–1.2)

## 2016-07-12 LAB — GLUCOSE, CAPILLARY
Glucose-Capillary: 230 mg/dL — ABNORMAL HIGH (ref 65–99)
Glucose-Capillary: 250 mg/dL — ABNORMAL HIGH (ref 65–99)

## 2016-07-12 MED ORDER — ZOLPIDEM TARTRATE 5 MG PO TABS
5.0000 mg | ORAL_TABLET | Freq: Every evening | ORAL | Status: DC | PRN
  Administered 2016-07-12 – 2016-07-13 (×2): 5 mg via ORAL
  Filled 2016-07-12 (×2): qty 1

## 2016-07-12 MED ORDER — INSULIN ASPART 100 UNIT/ML ~~LOC~~ SOLN
0.0000 [IU] | Freq: Every day | SUBCUTANEOUS | Status: DC
  Administered 2016-07-12: 2 [IU] via SUBCUTANEOUS
  Administered 2016-07-13: 3 [IU] via SUBCUTANEOUS
  Administered 2016-07-15: 4 [IU] via SUBCUTANEOUS

## 2016-07-12 MED ORDER — GABAPENTIN 100 MG PO CAPS
100.0000 mg | ORAL_CAPSULE | Freq: Three times a day (TID) | ORAL | Status: DC
  Administered 2016-07-12 – 2016-07-17 (×14): 100 mg via ORAL
  Filled 2016-07-12 (×18): qty 1

## 2016-07-12 MED ORDER — ASPIRIN EC 81 MG PO TBEC: 81.0000 mg | DELAYED_RELEASE_TABLET | Freq: Every day | ORAL | Status: DC

## 2016-07-12 MED ORDER — INSULIN GLARGINE 100 UNIT/ML ~~LOC~~ SOLN
11.0000 [IU] | Freq: Every day | SUBCUTANEOUS | Status: DC
  Administered 2016-07-12 – 2016-07-14 (×3): 11 [IU] via SUBCUTANEOUS
  Filled 2016-07-12 (×4): qty 0.11

## 2016-07-12 MED ORDER — FUROSEMIDE 10 MG/ML IJ SOLN
40.0000 mg | Freq: Two times a day (BID) | INTRAMUSCULAR | Status: DC
  Administered 2016-07-12 – 2016-07-17 (×10): 40 mg via INTRAVENOUS
  Filled 2016-07-12 (×11): qty 4

## 2016-07-12 MED ORDER — SODIUM CHLORIDE 0.9 % IV SOLN: 250.0000 mL | INTRAVENOUS | Status: DC | PRN

## 2016-07-12 MED ORDER — ROSUVASTATIN CALCIUM 40 MG PO TABS
40.0000 mg | ORAL_TABLET | Freq: Every day | ORAL | Status: DC
  Administered 2016-07-12 – 2016-07-17 (×5): 40 mg via ORAL
  Filled 2016-07-12 (×6): qty 1

## 2016-07-12 MED ORDER — VITAMIN D 1000 UNITS PO TABS
1000.0000 [IU] | ORAL_TABLET | Freq: Every day | ORAL | Status: DC
  Administered 2016-07-12 – 2016-07-17 (×5): 1000 [IU] via ORAL
  Filled 2016-07-12 (×6): qty 1

## 2016-07-12 MED ORDER — SODIUM CHLORIDE 0.9% FLUSH: 3.0000 mL | INTRAVENOUS | Status: DC | PRN

## 2016-07-12 MED ORDER — ACETAMINOPHEN 325 MG PO TABS
650.0000 mg | ORAL_TABLET | ORAL | Status: DC | PRN
  Administered 2016-07-13 – 2016-07-15 (×4): 650 mg via ORAL
  Filled 2016-07-12 (×5): qty 2

## 2016-07-12 MED ORDER — INSULIN ASPART 100 UNIT/ML ~~LOC~~ SOLN: 11.0000 [IU] | Freq: Three times a day (TID) | SUBCUTANEOUS | Status: DC

## 2016-07-12 MED ORDER — LISINOPRIL 10 MG PO TABS: 5.0000 mg | ORAL_TABLET | Freq: Every day | ORAL | Status: DC

## 2016-07-12 MED ORDER — ESCITALOPRAM OXALATE 10 MG PO TABS
20.0000 mg | ORAL_TABLET | Freq: Every day | ORAL | Status: DC
  Administered 2016-07-12 – 2016-07-17 (×5): 20 mg via ORAL
  Filled 2016-07-12 (×6): qty 2

## 2016-07-12 MED ORDER — LISINOPRIL 5 MG PO TABS
5.0000 mg | ORAL_TABLET | Freq: Every day | ORAL | Status: DC
  Administered 2016-07-12 – 2016-07-17 (×5): 5 mg via ORAL
  Filled 2016-07-12 (×6): qty 1

## 2016-07-12 MED ORDER — INSULIN ASPART 100 UNIT/ML ~~LOC~~ SOLN
0.0000 [IU] | Freq: Three times a day (TID) | SUBCUTANEOUS | Status: DC
  Administered 2016-07-12: 5 [IU] via SUBCUTANEOUS
  Administered 2016-07-13: 8 [IU] via SUBCUTANEOUS
  Administered 2016-07-13: 15 [IU] via SUBCUTANEOUS
  Administered 2016-07-13 – 2016-07-14 (×2): 3 [IU] via SUBCUTANEOUS
  Administered 2016-07-14: 15 [IU] via SUBCUTANEOUS
  Administered 2016-07-14: 11 [IU] via SUBCUTANEOUS
  Administered 2016-07-15: 3 [IU] via SUBCUTANEOUS
  Administered 2016-07-15: 15 [IU] via SUBCUTANEOUS
  Administered 2016-07-15: 2 [IU] via SUBCUTANEOUS
  Administered 2016-07-16 – 2016-07-17 (×3): 5 [IU] via SUBCUTANEOUS
  Administered 2016-07-17: 3 [IU] via SUBCUTANEOUS

## 2016-07-12 MED ORDER — FUROSEMIDE 10 MG/ML IJ SOLN
80.0000 mg | Freq: Once | INTRAMUSCULAR | Status: AC
  Administered 2016-07-12: 80 mg via INTRAVENOUS
  Filled 2016-07-12: qty 8

## 2016-07-12 MED ORDER — ENOXAPARIN SODIUM 40 MG/0.4ML ~~LOC~~ SOLN
40.0000 mg | SUBCUTANEOUS | Status: DC
  Administered 2016-07-12 – 2016-07-17 (×6): 40 mg via SUBCUTANEOUS
  Filled 2016-07-12 (×6): qty 0.4

## 2016-07-12 MED ORDER — SODIUM CHLORIDE 0.9% FLUSH
3.0000 mL | Freq: Two times a day (BID) | INTRAVENOUS | Status: DC
  Administered 2016-07-12 – 2016-07-16 (×9): 3 mL via INTRAVENOUS

## 2016-07-12 MED ORDER — POTASSIUM CHLORIDE CRYS ER 20 MEQ PO TBCR: 20.0000 meq | EXTENDED_RELEASE_TABLET | Freq: Two times a day (BID) | ORAL | Status: DC

## 2016-07-12 MED ORDER — POTASSIUM CHLORIDE CRYS ER 20 MEQ PO TBCR
20.0000 meq | EXTENDED_RELEASE_TABLET | Freq: Every day | ORAL | Status: DC
  Administered 2016-07-12 – 2016-07-13 (×2): 20 meq via ORAL
  Filled 2016-07-12 (×2): qty 1

## 2016-07-12 MED ORDER — CLOPIDOGREL BISULFATE 75 MG PO TABS
75.0000 mg | ORAL_TABLET | Freq: Every day | ORAL | Status: DC
  Administered 2016-07-12 – 2016-07-17 (×5): 75 mg via ORAL
  Filled 2016-07-12 (×6): qty 1

## 2016-07-12 MED ORDER — ANASTROZOLE 1 MG PO TABS
1.0000 mg | ORAL_TABLET | Freq: Every day | ORAL | Status: DC
  Administered 2016-07-12 – 2016-07-17 (×5): 1 mg via ORAL
  Filled 2016-07-12 (×6): qty 1

## 2016-07-12 MED ORDER — NITROGLYCERIN 0.4 MG SL SUBL: 0.4000 mg | SUBLINGUAL_TABLET | SUBLINGUAL | Status: DC | PRN

## 2016-07-12 MED ORDER — METOPROLOL TARTRATE 12.5 MG HALF TABLET
12.5000 mg | ORAL_TABLET | Freq: Two times a day (BID) | ORAL | Status: DC
  Administered 2016-07-12 – 2016-07-17 (×10): 12.5 mg via ORAL
  Filled 2016-07-12 (×12): qty 1

## 2016-07-12 MED ORDER — CARVEDILOL 3.125 MG PO TABS: 3.1250 mg | ORAL_TABLET | Freq: Two times a day (BID) | ORAL | Status: DC

## 2016-07-12 MED ORDER — ONDANSETRON HCL 4 MG/2ML IJ SOLN
4.0000 mg | Freq: Four times a day (QID) | INTRAMUSCULAR | Status: DC | PRN
  Administered 2016-07-16: 4 mg via INTRAVENOUS
  Filled 2016-07-12: qty 2

## 2016-07-12 NOTE — ED Triage Notes (Signed)
To room via EMS. Per EMS family reports onset yesterday bilateral leg pain/swelling R>L, and wound on lateral side of right ankle.  Onset 2-3 months congested cough.

## 2016-07-12 NOTE — H&P (Signed)
History and Physical    [name] [name] BSW:[id number] DOB: 1941-01-24 DOA: 07/12/2016  PCP: [name], MD   Patient coming from: Home  I have personally briefly reviewed patient's old medical records in [place]  Chief Complaint: Bilateral lower extremity edema with ulceration on the right lateral ankle  HPI: [name] [name] is a 76 y.o. female  with a history of prior stroke, she is nonverbal related to this, she also has a history of hypertension hyperlipidemia and diabetes as well as CHF. She presents by EMS from home with reports of worsening bilateral lower extremity edema. She states that she's had a wound on the lateral aspect of her right ankle that is been there for several months but started draining over last 2-3 days. She denies any known fevers. No chest pain or shortness of breath. She does have a cough that's been going on for about 2 months.  She's not change her medications recently. She's been taking her medications as prescribed. At some point her beta blocker was discontinued and she is no longer taking metoprolol. She is nonverbal but communicates through writing. Majority of the history is obtained from the ER physician. At the time I saw her patient was complaining of needing to have her brief changed.  (For level 3, the HPI must include 4+ descriptors: Location, Quality, Severity, Duration, Timing, Context, modifying factors, associated signs/symptoms and/or status of 3+ chronic problems.)  (Please avoid self-populating past medical history here) (The initial 2-3 lines should be focused and good to copy and paste in the HPI section of the daily progress note).  ED Course: Patient was evaluated found to be in volume overload with lower extremity edema rails and an elevated BNP. A 80 mg dose of Lasix was administered with excellent results.  Review of Systems: As per HPI otherwise 10 point review of systems negative.   Review of Systems  Constitutional: Negative  for chills and fever.  HENT: Negative for congestion, nosebleeds and sinus pain.   Eyes: Negative for blurred vision and double vision.  Respiratory: Positive for cough. Negative for hemoptysis and sputum production.   Cardiovascular: Positive for leg swelling. Negative for chest pain and palpitations.  Gastrointestinal: Negative for heartburn and vomiting.  Genitourinary: Negative for dysuria, frequency and urgency.  Musculoskeletal: Negative for back pain, myalgias and neck pain.  Skin: Negative for itching and rash.  Neurological: Positive for weakness. Negative for dizziness and headaches.  Endo/Heme/Allergies: Negative for environmental allergies. Does not bruise/bleed easily.  Psychiatric/Behavioral: Negative for depression and suicidal ideas.   Past Medical History:  Diagnosis Date  . Angina   . CHF (congestive heart failure) ([place])   . Complication of anesthesia    lung   . Diabetes mellitus ([place])   . GERD (gastroesophageal reflux disease)   . H/O hiatal hernia   . Hypercholesteremia   . Hypertension   . Myocardial infarction [facility])    CABG 2001, ? stent. Cath 02/2011 with new distal LIMA-LAD 80%. SVG-OM occlusion is old, rest of grafts patent  . Peripheral vascular disease ([place])   . Stroke [facility]) 2002 and 2003   Resultant right hemiparesis and aphasia. Communicates by writing     Past Surgical History:  Procedure Laterality Date  . ABDOMINAL HYSTERECTOMY    . BREAST BIOPSY    . BREAST CYST EXCISION Left    years ago per daughter  . CARDIAC CATHETERIZATION  02/2011  . CORONARY ARTERY BYPASS GRAFT    . ESOPHAGOGASTRODUODENOSCOPY  03/12/2011  Procedure: ESOPHAGOGASTRODUODENOSCOPY (EGD);  Surgeon: [name], MD;  Location[facility] ENDOSCOPY;  Service: Endoscopy;  Laterality: N/A;  . LEFT HEART CATHETERIZATION WITH CORONARY ANGIOGRAM N/A 03/10/2011   Procedure: LEFT HEART CATHETERIZATION WITH CORONARY ANGIOGRAM;  Surgeon: [name], MD;  Location: [facility] CATH LAB;   Service: Cardiovascular;  Laterality: N/A;     reports that she has never smoked. She has never used smokeless tobacco. She reports that she does not drink alcohol or use drugs.  Allergies  Allergen Reactions  . Penicillins Swelling    Swelling of mouth Has patient had a PCN reaction causing immediate rash, facial/tongue/throat swelling, SOB or lightheadedness with hypotension: YES Has patient had a PCN reaction causing severe rash involving mucus membranes or skin necrosis: NO Has patient had a PCN reaction that required hospitalization NO Has patient had a PCN reaction occurring within the last 10 years: NO If all of the above answers are "NO", then may proceed with Cephalosporin use.    Family History  Problem Relation Age of Onset  . Heart disease Mother   . Stroke Father   . Diabetes Sister   . Diabetes Brother   . Anesthesia problems Neg Hx   . Hypotension Neg Hx   . Malignant hyperthermia Neg Hx   . Pseudochol deficiency Neg Hx      Prior to Admission medications   Medication Sig Start Date End Date Taking? Authorizing Provider  anastrozole (ARIMIDEX) 1 MG tablet Take 1 tablet (1 mg total) by mouth daily. 05/28/16  Yes Magrinat, Virgie Dad, MD  cholecalciferol (VITAMIN D) 1000 units tablet Take 1,000 Units by mouth daily.   Yes [provider]  clopidogrel (PLAVIX) 75 MG tablet Take 75 mg by mouth daily.     Yes [provider]  escitalopram (LEXAPRO) 20 MG tablet Take 20 mg by mouth daily.   Yes [provider]  furosemide (LASIX) 40 MG tablet Take 1 tablet (40 mg total) by mouth daily. 01/28/14  Yes Rai, Ripudeep K, MD  gabapentin (NEURONTIN) 100 MG capsule Take 1 capsule (100 mg total) by mouth 3 (three) times daily. 01/28/14  Yes Rai, Ripudeep K, MD  insulin aspart (NOVOLOG) 100 UNIT/ML injection Inject 4 Units into the skin 3 (three) times daily with meals. Patient taking differently: Inject 11 Units into the skin 3 (three) times daily with meals.   12/04/15  Yes Elgergawy, Silver Huguenin, MD  insulin glargine (LANTUS) 100 UNIT/ML injection Inject 0.12 mLs (12 Units total) into the skin daily. Patient taking differently: Inject 11 Units into the skin daily.  12/04/15  Yes Elgergawy, Silver Huguenin, MD  lisinopril (PRINIVIL,ZESTRIL) 5 MG tablet Take 1 tablet (5 mg total) by mouth daily. 12/04/15  Yes Elgergawy, Silver Huguenin, MD  potassium chloride SA (K-DUR,KLOR-CON) 20 MEQ tablet Take 1 tablet (20 mEq total) by mouth daily. 07/20/13  Yes [name] Prows, MD  rosuvastatin (CRESTOR) 40 MG tablet Take 40 mg by mouth daily.   Yes [provider]  lidocaine (XYLOCAINE) 5 % ointment Apply 1 application topically 2 (two) times daily as needed. 06/24/16   Landis Martins, DPM  metoprolol tartrate (LOPRESSOR) 25 MG tablet Take 0.5 tablets (12.5 mg total) by mouth 2 (two) times daily. Patient not taking: Reported on 07/12/2016 12/04/15   Elgergawy, Silver Huguenin, MD  nitroGLYCERIN (NITROSTAT) 0.4 MG SL tablet Place 0.4 mg under the tongue every 5 (five) minutes as needed for chest pain.    [provider]    Physical Exam:  Vitals:   07/12/16 1142 07/12/16 1146 07/12/16 1153 07/12/16 1315  BP:  (!) 142/84  126/64  Pulse:  69  70  Resp:  18  (!) 24  Temp:  98.2 F (36.8 C)    TempSrc:  Oral    SpO2: 98% 99% 100% 100%    Constitutional: NAD, calm, comfortable Vitals:   07/12/16 1142 07/12/16 1146 07/12/16 1153 07/12/16 1315  BP:  (!) 142/84  126/64  Pulse:  69  70  Resp:  18  (!) 24  Temp:  98.2 F (36.8 C)    TempSrc:  Oral    SpO2: 98% 99% 100% 100%   Eyes: PERRL, lids and conjunctivae normal ENMT: Mucous membranes are moist. Posterior pharynx clear of any exudate or lesions.Normal dentition.  Neck: normal, supple, no masses, no thyromegaly Respiratory: clear to auscultation bilaterally, no wheezing, no crackles. Normal respiratory effort. No accessory muscle use.  Cardiovascular: Regular rate and rhythm, no murmurs / rubs / gallops. No  extremity edema. 2+ pedal pulses. No carotid bruits.  Abdomen: no tenderness, no masses palpated. No hepatosplenomegaly. Bowel sounds positive.  Musculoskeletal: no clubbing / cyanosis. No joint deformity upper and lower extremities. Good ROM, no contractures. Normal muscle tone.  Skin: no rashes, lesions. No induration.  Ulceration and discoloring or right ankle lateral 1 cm in diameter Neurologic: CN 2-12 grossly intact. Sensation intact, DTR normal. Strength 5/5 in all 4.  Psychiatric: Normal judgment and insight. Alert and oriented x 3. Normal mood.     Labs on Admission: I have personally reviewed following labs and imaging studies  CBC:  Recent Labs Lab 07/12/16 1206  WBC 3.9*  NEUTROABS 2.3  HGB 10.1*  HCT 33.7*  MCV 88.0  PLT 099   Basic Metabolic Panel:  Recent Labs Lab 07/12/16 1206  NA 139  K 4.1  CL 107  CO2 26  GLUCOSE 263*  BUN 9  CREATININE 0.74  CALCIUM 9.1   GFR: CrCl cannot be calculated (Unknown ideal weight.). Liver Function Tests:  Recent Labs Lab 07/12/16 1206  AST 21  ALT 17  ALKPHOS 68  BILITOT 0.7  PROT 6.9  ALBUMIN 3.5   No results for input(s): LIPASE, AMYLASE in the last 168 hours. No results for input(s): AMMONIA in the last 168 hours. Coagulation Profile: No results for input(s): INR, PROTIME in the last 168 hours. Cardiac Enzymes: No results for input(s): CKTOTAL, CKMB, CKMBINDEX, TROPONINI in the last 168 hours. BNP (last 3 results) No results for input(s): PROBNP in the last 8760 hours. HbA1C: No results for input(s): HGBA1C in the last 72 hours. CBG: No results for input(s): GLUCAP in the last 168 hours. Lipid Profile: No results for input(s): CHOL, HDL, LDLCALC, TRIG, CHOLHDL, LDLDIRECT in the last 72 hours. Thyroid Function Tests: No results for input(s): TSH, T4TOTAL, FREET4, T3FREE, THYROIDAB in the last 72 hours. Anemia Panel: No results for input(s): VITAMINB12, FOLATE, FERRITIN, TIBC, IRON, RETICCTPCT in the  last 72 hours. Urine analysis:    Component Value Date/Time   COLORURINE YELLOW 06/07/2016 2025   APPEARANCEUR CLEAR 06/07/2016 2025   LABSPEC 1.023 06/07/2016 2025   PHURINE 5.0 06/07/2016 2025   GLUCOSEU 150 (A) 06/07/2016 2025   HGBUR NEGATIVE 06/07/2016 2025   BILIRUBINUR NEGATIVE 06/07/2016 2025   KETONESUR 5 (A) 06/07/2016 2025   PROTEINUR NEGATIVE 06/07/2016 2025   UROBILINOGEN 1.0 07/13/2013 2255   NITRITE NEGATIVE 06/07/2016 2025   LEUKOCYTESUR NEGATIVE 06/07/2016 2025    Radiological Exams on Admission: Dg  Chest 2 View  Result Date: 07/12/2016 CLINICAL DATA:  Patient reports she has had a wound that has been getting worse X 1 month, patient denies any chest complaints at this time HX stroke, CHF EXAM: CHEST  2 VIEW COMPARISON:  06/07/2016 FINDINGS: Changes from CABG surgery are stable. Cardiac silhouette is mildly enlarged. No mediastinal or hilar masses. No evidence of adenopathy. There are prominent bronchovascular markings bilaterally similar to the prior study. There is opacity at the right lung base partly silhouetting the hemidiaphragm consistent with small pleural effusion. There is thickening along the fissures. No evidence of pneumonia.  No pneumothorax. Skeletal structures are demineralized. There is a mild wedge-shaped deformity at the thoracolumbar junction stable from a lateral chest radiograph dated 08/03/2015. IMPRESSION: 1. Mild cardiomegaly, prominent bronchovascular markings, thickening of the fissures and small right pleural effusion. Findings are similar to prior studies, suggesting a mild degree of chronic congestive heart failure. No evidence of pneumonia. Electronically Signed   By: Lajean Manes M.D.   On: 07/12/2016 12:33   Dg Ankle Complete Right  Result Date: 07/12/2016 CLINICAL DATA:  Patient reports she has had a wound that has been getting worse X 1 month, patient denies any chest complaints at this time HX stroke, CHF EXAM: RIGHT ANKLE - COMPLETE 3+  VIEW COMPARISON:  12/18/2014 FINDINGS: No fracture. There is a chronic area of bony prominence and sclerosis along the anterolateral aspect of the distal tibia. There is no bone resorption to suggest osteomyelitis. Bones are demineralized. The ankle mortise is normally spaced and aligned. There is diffuse soft tissue swelling.  No soft tissue air. IMPRESSION: 1. No fracture or dislocation. 2. No evidence of osteomyelitis. Electronically Signed   By: Lajean Manes M.D.   On: 07/12/2016 12:34    EKG: Independently reviewed.  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 Diastolic congestive heart failure: Patient will be admitted into the hospital recent echocardiogram obtained in October 2017 revealed an ejection fraction of 51-76% with diastolic dysfunction. No systolic function was mentioned. We'll treat the patient with IV Lasix increasing her to 40 mg IV every 12 hours. She had an extraordinarily brisk response to 80 mg of Lasix in the emergency department. Patient has been off her beta blocker for unknown reasons. We'll restart her metoprolol. Heart rate approximately 70 bpm we'll monitor heart rates closely with restarting beta blocker.  2. ankle ulcer on the right limited to breakdown of the skin: Will consult wound care for further evaluation and management. ` Greatest risk to patient scan currently is the edema she is having. Of note patient refuses to elevate her legs she says it hurts her feet too much when she does.  Work with patient on education.  3. Diabetes mellitus: Blood glucose elevated as would be expected given her sedentary lifestyle and inability to ambulate. Patient will need to have close monitoring of blood glucoses and consider major dietary changes for the benefit of her long-term health.  4. Hypertension continue home medications add back her beta blocker.  5. Coronary artery disease  of native coronary artery: Continue home medications in the form of Plavix beta blocker ACE inhibitor etc.  6. Generalized weakness: Patient uses a scooter to get around. Will have PT and OT evaluate the patient consider if she is a candidate for rehabilitation either inpatient or outpatient.  7. Hemiparesis and speech and line was deficit as late effects of cerebrovascular accident: Patient is nonverbal she has limited use of the right side will monitor closely and allow her to write her answers.  DVT prophylaxis: Lovenox Code Status: DO NOT RESUSCITATE Family Communication: No family present at the time of evaluation. Disposition Plan: Hopefully home in 3-4 days. Consults called: Wound care management. Admission status: Inpatient  Lady Deutscher MD FACP [facility] Pager [phone]  If 7PM-7AM, please contact night-coverage [email or web address] Password [email or web address]  07/12/2016, 3:03 PM

## 2016-07-12 NOTE — ED Provider Notes (Signed)
[facility] DEPT Provider Note   CSN: [id number] Arrival date & time: 07/12/16  1134     History   Chief Complaint Chief Complaint  Patient presents with  . Leg Swelling  . Cough    HPI [name] [name] is a 76 y.o. female.  Patient is a 76 year old female with a history of prior stroke, she is nonverbal related to this, she also has a history of hypertension hyperlipidemia and diabetes as well as CHF. She presents by EMS from home with reports of worsening bilateral lower extremity edema. She states that she's had a wound on the lateral aspect of her right ankle that is been there for several months but started draining over last 2-3 days. She denies any known fevers. No chest pain or shortness of breath. She does have a cough that's been going on for about 2 months. No known fevers. She's not change her medications recently. She's been taking her medications as prescribed. She is nonverbal but communicates through writing.      Past Medical History:  Diagnosis Date  . Angina   . CHF (congestive heart failure) ([place])   . Complication of anesthesia    lung   . Diabetes mellitus ([place])   . GERD (gastroesophageal reflux disease)   . H/O hiatal hernia   . Hypercholesteremia   . Hypertension   . Myocardial infarction [facility])    CABG 2001, ? stent. Cath 02/2011 with new distal LIMA-LAD 80%. SVG-OM occlusion is old, rest of grafts patent  . Peripheral vascular disease ([place])   . Stroke [facility] 2002 and 2003   Resultant right hemiparesis and aphasia. Communicates by writing     Patient Active Problem List   Diagnosis Date Noted  . Malignant neoplasm of lower-outer quadrant of right breast of female, estrogen receptor positive ([place]) 05/28/2016  . Chronic combined systolic and diastolic CHF (congestive heart failure) ([place]) 12/01/2015  . Acute encephalopathy 12/01/2015  . Hypoglycemia 12/01/2015  . Elevated troponin 12/01/2015  . Diabetes mellitus without complication ([place])   .  Hypothermia   . Rhonchi   . Acute respiratory failure ([place]) 01/28/2014  . Respiratory distress 01/27/2014  . Hypoxia 01/27/2014  . CHF exacerbation ([place]) 01/27/2014  . Acute on chronic systolic CHF (congestive heart failure) ([place]) 01/27/2014  . Systolic CHF, acute on chronic ([place]) 01/27/2014  . Increased anion gap metabolic acidosis 28/41/3244  . Right foot ulcer ([place]) 01/27/2014  . Aspiration pneumonia ([employer]) 06/23/2012  . Dysphagia 06/21/2012  . Palliative care encounter 06/20/2012  . Healthcare-associated pneumonia 06/16/2012  . TIA (transient ischemic attack) 05/05/2012  . N&V (nausea and vomiting) 05/04/2012  . Weakness generalized 05/04/2012  . 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 NSTEMI (non-ST elevated myocardial infarction) ([place]) 07/27/2011  . Diabetes mellitus ([place])   . Chest pain 03/10/2011  . HTN (hypertension) 03/10/2011  . Cerebral infarction ([place]) 03/10/2011  . CAD (coronary artery disease), native coronary artery 03/10/2011  . 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 S/P PTCA (percutaneous transluminal coronary angioplasty) 03/10/2011    Past Surgical History:  Procedure Laterality Date  . ABDOMINAL HYSTERECTOMY    . BREAST BIOPSY    . BREAST CYST EXCISION Left    years ago per daughter  . CARDIAC CATHETERIZATION  02/2011  . CORONARY ARTERY BYPASS GRAFT    . ESOPHAGOGASTRODUODENOSCOPY  03/12/2011   Procedure: ESOPHAGOGASTRODUODENOSCOPY (EGD);  Surgeon: [name], MD;  Location: [facility] ENDOSCOPY;  Service: Endoscopy;  Laterality:  N/A;  . LEFT HEART CATHETERIZATION WITH CORONARY ANGIOGRAM N/A 03/10/2011   Procedure: LEFT HEART CATHETERIZATION WITH CORONARY ANGIOGRAM;  Surgeon: [name], MD;  Location: [facility] CATH LAB;  Service: Cardiovascular;  Laterality: N/A;    OB History    No data available       Home Medications    Prior to Admission medications   Medication Sig Start Date End Date Taking?  Authorizing Provider  anastrozole (ARIMIDEX) 1 MG tablet Take 1 tablet (1 mg total) by mouth daily. 05/28/16  Yes Magrinat, Virgie Dad, MD  cholecalciferol (VITAMIN D) 1000 units tablet Take 1,000 Units by mouth daily.   Yes [provider]  clopidogrel (PLAVIX) 75 MG tablet Take 75 mg by mouth daily.     Yes [provider]  escitalopram (LEXAPRO) 20 MG tablet Take 20 mg by mouth daily.   Yes [provider]  furosemide (LASIX) 40 MG tablet Take 1 tablet (40 mg total) by mouth daily. 01/28/14  Yes Rai, Ripudeep K, MD  gabapentin (NEURONTIN) 100 MG capsule Take 1 capsule (100 mg total) by mouth 3 (three) times daily. 01/28/14  Yes Rai, Ripudeep K, MD  insulin aspart (NOVOLOG) 100 UNIT/ML injection Inject 4 Units into the skin 3 (three) times daily with meals. Patient taking differently: Inject 11 Units into the skin 3 (three) times daily with meals.  12/04/15  Yes Elgergawy, Silver Huguenin, MD  insulin glargine (LANTUS) 100 UNIT/ML injection Inject 0.12 mLs (12 Units total) into the skin daily. Patient taking differently: Inject 11 Units into the skin daily.  12/04/15  Yes Elgergawy, Silver Huguenin, MD  lisinopril (PRINIVIL,ZESTRIL) 5 MG tablet Take 1 tablet (5 mg total) by mouth daily. 12/04/15  Yes Elgergawy, Silver Huguenin, MD  potassium chloride SA (K-DUR,KLOR-CON) 20 MEQ tablet Take 1 tablet (20 mEq total) by mouth daily. 07/20/13  Yes [name] Prows, MD  rosuvastatin (CRESTOR) 40 MG tablet Take 40 mg by mouth daily.   Yes [provider]  lidocaine (XYLOCAINE) 5 % ointment Apply 1 application topically 2 (two) times daily as needed. 06/24/16   Landis Martins, DPM  metoprolol tartrate (LOPRESSOR) 25 MG tablet Take 0.5 tablets (12.5 mg total) by mouth 2 (two) times daily. Patient not taking: Reported on 07/12/2016 12/04/15   Elgergawy, Silver Huguenin, MD  nitroGLYCERIN (NITROSTAT) 0.4 MG SL tablet Place 0.4 mg under the tongue every 5 (five) minutes as needed for chest pain.    [provider]    Family History Family History  Problem Relation Age of Onset  . Heart disease Mother   . Stroke Father   . Diabetes Sister   . Diabetes Brother   . Anesthesia problems Neg Hx   . Hypotension Neg Hx   . Malignant hyperthermia Neg Hx   . Pseudochol deficiency Neg Hx     Social History Social History  Substance Use Topics  . Smoking status: Never Smoker  . Smokeless tobacco: Never Used  . Alcohol use No     Allergies   Penicillins   Review of Systems Review of Systems  Constitutional: Negative for chills, diaphoresis, fatigue and fever.  HENT: Negative for congestion, rhinorrhea and sneezing.   Eyes: Negative.   Respiratory: Positive for cough. Negative for chest tightness and shortness of breath.   Cardiovascular: Positive for leg swelling. Negative for chest pain.  Gastrointestinal: Negative for abdominal pain, blood in stool, diarrhea, nausea and vomiting.  Genitourinary: Negative for difficulty urinating, flank pain, frequency and hematuria.  Musculoskeletal: Negative for arthralgias and back pain.  Skin: Positive for wound. Negative for rash.  Neurological: Negative for dizziness, speech difficulty, weakness, numbness and headaches.     Physical Exam Updated Vital Signs BP 126/64   Pulse 70   Temp 98.2 F (36.8 C) (Oral)   Resp (!) 24   SpO2 100%   Physical Exam  Constitutional: She is oriented to person, place, and time. She appears well-developed and well-nourished.  HENT:  Head: Normocephalic and atraumatic.  Eyes: Pupils are equal, round, and reactive to light.  Neck: Normal range of motion. Neck supple.  Cardiovascular: Normal rate, regular rhythm and normal heart sounds.   Pulmonary/Chest: Effort normal. No respiratory distress. She has no wheezes. She has rales. She exhibits no tenderness.  Abdominal: Soft. Bowel sounds are normal. There is no tenderness. There is no rebound and no guarding.  Musculoskeletal: Normal range of  motion. She exhibits edema (3+ pain edema bilaterally).  Lymphadenopathy:    She has no cervical adenopathy.  Neurological: She is alert and oriented to person, place, and time.  Skin: Skin is warm and dry. No rash noted.  Patient has some scar tissue to the dorsal surface of the right foot. There also is a 1 cm circular ulcerated area overlying the lateral malleolus. There is no apparent drainage from the site. No significant erythema or warmth around the area  Psychiatric: She has a normal mood and affect.       ED Treatments / Results  Labs (all labs ordered are listed, but only abnormal results are displayed) Labs Reviewed  COMPREHENSIVE METABOLIC PANEL - Abnormal; Notable for the following:       Result Value   Glucose, Bld 263 (*)    All other components within normal limits  CBC WITH DIFFERENTIAL/PLATELET - Abnormal; Notable for the following:    WBC 3.9 (*)    RBC 3.83 (*)    Hemoglobin 10.1 (*)    HCT 33.7 (*)    RDW 16.3 (*)    All other components within normal limits  BRAIN NATRIURETIC PEPTIDE - Abnormal; Notable for the following:    B Natriuretic Peptide 810.5 (*)    All other components within normal limits    EKG  EKG Interpretation  Date/Time:  Saturday Jul 12 2016 12:37:19 EDT Ventricular Rate:  61 PR Interval:    QRS Duration: 117 QT Interval:  448 QTC Calculation: 452 R Axis:   75 Text Interpretation:  Sinus rhythm Atrial premature complexes Prolonged PR interval Nonspecific intraventricular conduction delay Repol abnrm suggests ischemia, lateral leads similar to EKG from 02/09/2008 T wave inversion as compared to EKG from April of this year Confirmed by [place]  MD, Uzziah Rigg [id number]) on 07/12/2016 12:55:53 PM       Radiology Dg Chest 2 View  Result Date: 07/12/2016 CLINICAL DATA:  Patient reports she has had a wound that has been getting worse X 1 month, patient denies any chest complaints at this time HX stroke, CHF EXAM: CHEST  2 VIEW COMPARISON:   06/07/2016 FINDINGS: Changes from CABG surgery are stable. Cardiac silhouette is mildly enlarged. No mediastinal or hilar masses. No evidence of adenopathy. There are prominent bronchovascular markings bilaterally similar to the prior study. There is opacity at the right lung base partly silhouetting the hemidiaphragm consistent with small pleural effusion. There is thickening along the fissures. No evidence of pneumonia.  No pneumothorax. Skeletal structures are demineralized. There is a mild wedge-shaped deformity at the thoracolumbar junction stable  from a lateral chest radiograph dated 08/03/2015. IMPRESSION: 1. Mild cardiomegaly, prominent bronchovascular markings, thickening of the fissures and small right pleural effusion. Findings are similar to prior studies, suggesting a mild degree of chronic congestive heart failure. No evidence of pneumonia. Electronically Signed   By: Lajean Manes M.D.   On: 07/12/2016 12:33   Dg Ankle Complete Right  Result Date: 07/12/2016 CLINICAL DATA:  Patient reports she has had a wound that has been getting worse X 1 month, patient denies any chest complaints at this time HX stroke, CHF EXAM: RIGHT ANKLE - COMPLETE 3+ VIEW COMPARISON:  12/18/2014 FINDINGS: No fracture. There is a chronic area of bony prominence and sclerosis along the anterolateral aspect of the distal tibia. There is no bone resorption to suggest osteomyelitis. Bones are demineralized. The ankle mortise is normally spaced and aligned. There is diffuse soft tissue swelling.  No soft tissue air. IMPRESSION: 1. No fracture or dislocation. 2. No evidence of osteomyelitis. Electronically Signed   By: Lajean Manes M.D.   On: 07/12/2016 12:34    Procedures Procedures (including critical care time)  Medications Ordered in ED Medications  furosemide (LASIX) injection 80 mg (80 mg Intravenous Given 07/12/16 1323)     Initial Impression / Assessment and Plan / ED Course  I have reviewed the triage vital  signs and the nursing notes.  Pertinent labs & imaging results that were available during my care of the patient were reviewed by me and considered in my medical decision making (see chart for details).     Patient presents with a foot ulcer and increased lower extremity edema. I don't really see any drainage. There may be an early infection starting but I don't see any significant areas of cellulitis. She's afebrile. She denies shortness of breath but she is to Neck and mildly hypoxic with an oxygen saturation of 88% on room air. She was placed on nasal cannula and is maintaining normal oxygen saturations on nasal cannula. She has signs of fluid overload with increased pedal edema and some mild fluid overload on chest x-ray. She was given a dose of Lasix in the ED. She lives at home alone and I feel with the symptoms she's having an associated hypoxia she would benefit from hospitalization for diuresis and treatment of the foot ulcer. I will consult the hospitalist for admission.  I spoke with Dr. Evangeline Gula who will admit the pt.  Final Clinical Impressions(s) / ED Diagnoses   Final diagnoses:  Acute systolic congestive heart failure ([place])  Skin ulcer of right foot, limited to breakdown of skin [facility])    New Prescriptions New Prescriptions   No medications on file     Malvin Johns, MD 07/12/16 1443

## 2016-07-13 ENCOUNTER — Inpatient Hospital Stay (HOSPITAL_COMMUNITY): Payer: Medicare HMO

## 2016-07-13 LAB — BASIC METABOLIC PANEL
Anion gap: 10 (ref 5–15)
BUN: 12 mg/dL (ref 6–20)
CO2: 28 mmol/L (ref 22–32)
CREATININE: 0.99 mg/dL (ref 0.44–1.00)
Calcium: 9.2 mg/dL (ref 8.9–10.3)
Chloride: 101 mmol/L (ref 101–111)
GFR calc Af Amer: >60 mL/min (ref >60)
GFR, EST NON AFRICAN AMERICAN: 54 mL/min — AB (ref >60)
Glucose, Bld: 219 mg/dL — ABNORMAL HIGH (ref 65–99)
Potassium: 3.7 mmol/L (ref 3.5–5.1)
SODIUM: 139 mmol/L (ref 135–145)

## 2016-07-13 LAB — GLUCOSE, CAPILLARY
GLUCOSE-CAPILLARY: 256 mg/dL — AB (ref 65–99)
GLUCOSE-CAPILLARY: 351 mg/dL — AB (ref 65–99)
GLUCOSE-CAPILLARY: 291 mg/dL — AB (ref 65–99)
Glucose-Capillary: 167 mg/dL — ABNORMAL HIGH (ref 65–99)

## 2016-07-13 LAB — BRAIN NATRIURETIC PEPTIDE: B NATRIURETIC PEPTIDE 5: 974.1 pg/mL — AB (ref 0.0–100.0)

## 2016-07-13 NOTE — Progress Notes (Addendum)
PROGRESS NOTE                                                                                                                                                                                                             Patient Demographics:    [name] [name], is a 76 y.o. female, DOB - 1940-12-09, CBJ:[id number]  Admit date - 07/12/2016   Admitting Physician Lady Deutscher, MD  Outpatient Primary MD for the patient is [name], MD  LOS - 1  Outpatient Specialists:  Chief Complaint  Patient presents with  . Leg Swelling  . Cough       Brief Narrative   76 year old female who is nonverbal following a stroke, hypertension, hyperlipidemia, diastolic CHF and diabetes mellitus brought to the ED by EMS with worsening bilateral lower extremity swelling. Also noted draining right lateral malleolus wound for past 2-3 days. Patient is nonverbal so history is limited. She complained of ongoing cough for almost 2 months. No change in her medications and reportedly adherent to them. Patient found to be in acute diastolic CHF. BNP was elevated at 810 and chest x-ray concerning for fluid overload. She received 80 mg IV Lasix in the ED and admitted to telemetry.   Subjective:   Patient communicates with writing. Feels her breathing to be better.   Assessment  & Plan :    Principal Problem:   Acute on chronic Diastolic CHF ([place]) Unclear what triggered her symptoms. Echo from October 2017 shows normal EF with basal inferior wall hypokinesis and grade 1 diastolic dysfunction. Continue IV Lasix 40 mg twice a day. Symptoms improving. Strict I/O and daily weight. Patient off beta blocker for reasons unclear. Metoprolol restarted. Continue lisinopril. Continue statin.  Active Problems:   Ankle ulcer, right, limited to breakdown of skin ([place]) Does not appear infected on exam. Wound care consult.  Diabetes mellitus with  hyperglycemia  Continue Lantus with sliding scale coverage. Check A1c.  Essential hypertension Blood pressure stable. Continue home medications.     CAD (coronary artery disease), native coronary artery Continue Plavix, statin. Added beta blocker.     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 [facility]) PT evaluation   Anemia of chronic disease Hemoglobin stable at baseline     Code Status : DO NOT RESUSCITATE  Family Communication  : None at bedside  Disposition Plan  : Home pending improvement in her leg swellings, possibly in the next 24-48 hours  Barriers For Discharge : Active symptoms  Consults  :   Wound care  Procedures  : None  DVT Prophylaxis  :  Lovenox -  Lab Results  Component Value Date   PLT 187 07/12/2016    Antibiotics  :   Anti-infectives    None        Objective:   Vitals:   07/12/16 2013 07/13/16 0000 07/13/16 0631 07/13/16 0953  BP: (!) 150/62 (!) 93/38 115/67 (!) 114/49  Pulse: 76 (!) 57 62 67  Resp: 20 16 16    Temp: 98.6 F (37 C) 98.5 F (36.9 C) 98.9 F (37.2 C)   TempSrc: Oral Oral Oral   SpO2: 98% 98% 100%   Weight:   71.2 kg (156 lb 14.4 oz)   Height:        Wt Readings from Last 3 Encounters:  07/13/16 71.2 kg (156 lb 14.4 oz)  12/04/15 83.1 kg (183 lb 1.6 oz)  01/28/14 78.8 kg (173 lb 11.2 oz)     Intake/Output Summary (Last 24 hours) at 07/13/16 1129 Last data filed at 07/13/16 0958  Gross per 24 hour  Intake              863 ml  Output                0 ml  Net              863 ml     Physical Exam  Gen.: Elderly female nonverbal, not in distress HEENT: Moist mucosa, supple neck Chest: Diminished bibasilar breath sounds, no added sounds CVS: Normal S1 and S2, no murmurs rub or gallop Chest: Soft, nondistended, nontender [place]: Warm, 2+ pitting edema bilaterally, 0.5 cm ulcer over right lateral malleolus, without bleeding or  discharge CNS: Alert and awake, nonverbal    Data Review:    CBC  Recent Labs Lab 07/12/16 1206  WBC 3.9*  HGB 10.1*  HCT 33.7*  PLT 187  MCV 88.0  MCH 26.4  MCHC 30.0  RDW 16.3*  LYMPHSABS 1.1  MONOABS 0.4  EOSABS 0.1  BASOSABS 0.0    Chemistries   Recent Labs Lab 07/12/16 1206 07/13/16 0340  NA 139 139  K 4.1 3.7  CL 107 101  CO2 26 28  GLUCOSE 263* 219*  BUN 9 12  CREATININE 0.74 0.99  CALCIUM 9.1 9.2  AST 21  --   ALT 17  --   ALKPHOS 68  --   BILITOT 0.7  --    ------------------------------------------------------------------------------------------------------------------ No results for input(s): CHOL, HDL, LDLCALC, TRIG, CHOLHDL, LDLDIRECT in the last 72 hours.  Lab Results  Component Value Date   HGBA1C 7.3 (H) 12/02/2015   ------------------------------------------------------------------------------------------------------------------ No results for input(s): TSH, T4TOTAL, T3FREE, THYROIDAB in the last 72 hours.  Invalid input(s): FREET3 ------------------------------------------------------------------------------------------------------------------ No results for input(s): VITAMINB12, FOLATE, FERRITIN, TIBC, IRON, RETICCTPCT in the last 72 hours.  Coagulation profile No results for input(s): INR, PROTIME in the last 168 hours.  No results for input(s): DDIMER in the last 72 hours.  Cardiac Enzymes No results for input(s): CKMB, TROPONINI, MYOGLOBIN in the last 168 hours.  Invalid input(s): CK ------------------------------------------------------------------------------------------------------------------    Component Value Date/Time   BNP 974.1 (H) 07/13/2016 0340    Inpatient Medications  Scheduled Meds: . anastrozole  1 mg Oral Daily  .  cholecalciferol  1,000 Units Oral Daily  . clopidogrel  75 mg Oral Daily  . enoxaparin (LOVENOX) injection  40 mg Subcutaneous Q24H  . escitalopram  20 mg Oral Daily  . furosemide  40 mg  Intravenous BID  . gabapentin  100 mg Oral TID  . insulin aspart  0-15 Units Subcutaneous TID WC  . insulin aspart  0-5 Units Subcutaneous QHS  . insulin glargine  11 Units Subcutaneous Daily  . lisinopril  5 mg Oral Daily  . metoprolol tartrate  12.5 mg Oral BID  . potassium chloride SA  20 mEq Oral Daily  . rosuvastatin  40 mg Oral Daily  . sodium chloride flush  3 mL Intravenous Q12H   Continuous Infusions: . sodium chloride     PRN Meds:.sodium chloride, acetaminophen, nitroGLYCERIN, ondansetron (ZOFRAN) IV, sodium chloride flush, zolpidem  Micro Results No results found for this or any previous visit (from the past 240 hour(s)).  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 Cardiac silhouette is mildly enlarged. No mediastinal or hilar masses. No evidence of adenopathy. There are prominent bronchovascular markings bilaterally similar to the prior study. There is opacity at the right lung base partly silhouetting the hemidiaphragm consistent with small pleural effusion. There is thickening along the fissures. No evidence of pneumonia.  No pneumothorax. Skeletal structures are demineralized. There is a mild wedge-shaped deformity at the thoracolumbar junction stable from a lateral chest radiograph dated 08/03/2015. IMPRESSION: 1. Mild cardiomegaly, prominent bronchovascular markings, thickening of the fissures and small right pleural effusion. Findings are similar to prior studies, suggesting a mild degree of chronic congestive heart failure. No evidence of pneumonia. Electronically Signed   By: Lajean Manes M.D.   On: 07/12/2016 12:33   Dg Ankle Complete Right  Result Date: 07/12/2016 CLINICAL DATA:  Patient reports she has had a  wound that has been getting worse X 1 month, patient denies any chest complaints at this time HX stroke, CHF EXAM: RIGHT ANKLE - COMPLETE 3+ VIEW COMPARISON:  12/18/2014 FINDINGS: No fracture. There is a chronic area of bony prominence and sclerosis along the anterolateral aspect of the distal tibia. There is no bone resorption to suggest osteomyelitis. Bones are demineralized. The ankle mortise is normally spaced and aligned. There is diffuse soft tissue swelling.  No soft tissue air. IMPRESSION: 1. No fracture or dislocation. 2. No evidence of osteomyelitis. Electronically Signed   By: Lajean Manes M.D.   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 Cardiac silhouette is mildly enlarged. No mediastinal hilar masses. Prominent bronchovascular markings and interstitial thickening has mildly improved. There is hazy opacity at the right lung base consistent with atelectasis possibly with a small effusion, stable. Mild thickening of the minor fissure appears less prominent than the previous day's study. No lung consolidation is seen to suggest pneumonia. There is no pneumothorax. IMPRESSION: 1. Mild improvement in lung aeration from the previous day's exam consistent with improved congestive heart failure/interstitial edema. No new abnormalities. Electronically Signed   By: Lajean Manes M.D.   On: 07/13/2016 07:40  Time Spent in minutes  25   Louellen Molder M.D on 07/13/2016 at 11:29 AM  Between 7am to 7pm - Pager - [phone]  After 7pm go to [email or web address] - password [facility]  [facility] -  Office  [phone]

## 2016-07-13 NOTE — Evaluation (Signed)
Clinical/Bedside Swallow Evaluation Patient Details  Name: [name] [name] MRN: [id number] Date of Birth: 1940/05/30  Today's Date: 07/13/2016 Time: SLP Start Time (ACUTE ONLY): 1341 SLP Stop Time (ACUTE ONLY): 1410 SLP Time Calculation (min) (ACUTE ONLY): 29 min  Past Medical History:  Past Medical History:  Diagnosis Date  . Angina   . CHF (congestive heart failure) ([place])   . Complication of anesthesia    lung   . Diabetes mellitus ([place])   . GERD (gastroesophageal reflux disease)   . H/O hiatal hernia   . Hypercholesteremia   . Hypertension   . Myocardial infarction [facility])    CABG 2001, ? stent. Cath 02/2011 with new distal LIMA-LAD 80%. SVG-OM occlusion is old, rest of grafts patent  . Peripheral vascular disease ([place])   . Stroke [facility]) 2002 and 2003   Resultant right hemiparesis and aphasia. Communicates by writing    Past Surgical History:  Past Surgical History:  Procedure Laterality Date  . ABDOMINAL HYSTERECTOMY    . BREAST BIOPSY    . BREAST CYST EXCISION Left    years ago per daughter  . CARDIAC CATHETERIZATION  02/2011  . CORONARY ARTERY BYPASS GRAFT    . ESOPHAGOGASTRODUODENOSCOPY  03/12/2011   Procedure: ESOPHAGOGASTRODUODENOSCOPY (EGD);  Surgeon: [name], MD;  Location: [facility] ENDOSCOPY;  Service: Endoscopy;  Laterality: N/A;  . LEFT HEART CATHETERIZATION WITH CORONARY ANGIOGRAM N/A 03/10/2011   Procedure: LEFT HEART CATHETERIZATION WITH CORONARY ANGIOGRAM;  Surgeon: [name], MD;  Location: 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 Also noted draining right lateral malleolus wound for past 2-3 days.  Dx acute on chronic diastolic CHF, ankle ulcer, DM.  Swallow evaluation ordered due to  difficulty swallowing pills per MD notes. Pt has had multiple swallowing assessments in the past; most recent MBS 12/03/15 revealed severe oral and mld pharyngeal dysphagia; aspiration with cough noted with thin liquids.  Chin tuck effectively prevented aspiration.    Assessment / Plan / Recommendation Clinical Impression  Pt presents with a significant oral and mild pharyngeal dysphagia that she has been dealing with since 2003.  Ms Large feeds herself independently.  She has severe bilateral weakness of the tongue, with preserved posterior elevation.  This leads to open mouth posture eating, with oral spillage bilaterally and lingual residue.  Pt uses posterior tongue to smash POs against hard palate, then transfers to pharynx.  She drinks thin liquids from a cup, using chin tuck for airway protection based on results of last MBS.  There is significant bolus loss with spillage requiring multiple towels during the course of a meal.  Pt self administers pill by bypassing anterior tongue and deliberately placing pills in pharynx.  When asked about diet modification she declines any changes to her diet.  We communicated during assessment with yes/no responses and pt writing in a notebook - her written text is marked by good grammatical form and semantics, suggesting no aphasia.  Recommend continuing current diet per her preferences - pt may benefit from further SLP to address oral dysphagia and possible adaptive tools/cups to facilitate improved oral control.   SLP Visit Diagnosis: Dysphagia, oropharyngeal phase (R13.12)    Aspiration Risk  Mild aspiration risk    Diet Recommendation   continue regular solids  per pt's desires  Medication Administration: Whole meds with liquid    Other  Recommendations Oral Care Recommendations: Oral care BID   Follow up Recommendations  (tba)      Frequency and Duration min 2x/week  1 week       Prognosis        Swallow Study   General HPI: 76 year old  female who is chronically and anarthric S/P bilateral corona radiata CVAs - writes to communicate , hypertension, hyperlipidemia, diastolic CHF and diabetes mellitus brought to the ED by EMS with worsening bilateral lower extremity swelling. Also noted draining right lateral malleolus wound for past 2-3 days.  Dx acute on chronic diastolic CHF, ankle ulcer, DM.  Swallow evaluation ordered due to difficulty swallowing pills per MD notes. Pt has had multiple swallowing assessments in the past; most recent MBS 12/03/15 revealed severe oral and mld pharyngeal dysphagia; aspiration with cough noted with thin liquids.  Chin tuck effectively prevented aspiration.  Type of Study: Bedside Swallow Evaluation Previous Swallow Assessment: see HPI Diet Prior to this Study: Regular;Thin liquids Temperature Spikes Noted: No Respiratory Status: Room air History of Recent Intubation: No Behavior/Cognition: Alert;Cooperative Oral Care Completed by SLP: No Oral Cavity - Dentition: Adequate natural dentition Vision: Functional for self-feeding Self-Feeding Abilities: Able to feed self Patient Positioning: Upright in bed Baseline Vocal Quality: Hoarse Volitional Cough: Strong Volitional Swallow: Able to elicit    Oral/Motor/Sensory Function Overall Oral Motor/Sensory Function: Severe impairment Facial ROM: Suspected CN VII (facial) dysfunction;Reduced right Lingual ROM: Suspected CN XII (hypoglossal) dysfunction;Reduced right;Reduced left Lingual Strength: Reduced;Suspected CN XII (hypoglossal) dysfunction   Ice Chips Ice chips: Not tested   Thin Liquid Thin Liquid: Impaired Presentation: Cup;Self Fed Oral Phase Impairments: Reduced labial seal;Reduced lingual movement/coordination Oral Phase Functional Implications: Right anterior spillage;Left anterior spillage;Prolonged oral transit    Nectar Thick Nectar Thick Liquid: Not tested   Honey Thick Honey Thick Liquid: Not tested   Puree Puree:  Impaired Presentation: Self Fed;Spoon Oral Phase Impairments: Reduced labial seal;Reduced lingual movement/coordination;Impaired mastication Oral Phase Functional Implications: Right anterior spillage;Left anterior spillage;Prolonged oral transit;Oral residue   Solid   GO   Solid: Impaired Presentation: Self Fed;Spoon Oral Phase Impairments: Reduced labial seal;Reduced lingual movement/coordination;Impaired mastication Oral Phase Functional Implications: Right anterior spillage;Left anterior spillage;Oral residue        Juan Quam Laurice 07/13/2016,2:19 PM

## 2016-07-13 NOTE — Evaluation (Signed)
Physical Therapy Evaluation Patient Details Name: [name] [name] MRN: [id number] DOB: 1940/09/26 Today's Date: 07/13/2016   History of Present Illness  Patient is a 76 yo female admitted 07/12/16 with CHF exacerbation.    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  Will benefit from acute PT to maximize functional mobility prior to discharge.  At end of session, patient reports she uses a quad cane for transfers, and hers is broken.  Will try at next session.  Patient wants to return home.  Has assist in am/pm for transfers/ADL's.  Unsure how she does during day alone.  Will need to f/u with daughter regarding safety when alone.  For now, recommend HHPT, and to increase HH Aide hours if possible.  Plan of care/discharge plan may need to change based on information from daughter.  Will continue to follow.    Follow Up Recommendations Home health PT;Supervision for mobility/OOB (Increased hours for [facility] Aide)    Equipment Recommendations  Other (comment) (Large based quad cane)    Recommendations for Other Services       Precautions / Restrictions Precautions Precautions: Fall Restrictions Weight Bearing Restrictions: No      Mobility  Bed Mobility Overal bed mobility: Needs Assistance Bed Mobility: Supine to Sit;Sit to Supine     Supine to sit: Min assist Sit to supine: Min assist   General bed mobility comments: Assist to move trunk to sitting, and to bring RLE onto bed to return to supine.  Once upright, patient with fair sitting balance.  Transfers Overall transfer level: Needs assistance Equipment used: 1 person hand held assist Transfers: Sit to/from Stand Sit to Stand: Max assist         General transfer comment: Patient required max assist to power up to stance.  Patient reaching for PT with LUE for UE support.  Required mod assist to maintain static  standing balance.  Ambulation/Gait             General Gait Details: N/A  Stairs            Wheelchair Mobility    Modified Rankin (Stroke Patients Only)       Balance Overall balance assessment: Needs assistance Sitting-balance support: Single extremity supported;Feet supported Sitting balance-Leahy Scale: Fair     Standing balance support: Single extremity supported Standing balance-Leahy Scale: Poor                               Pertinent Vitals/Pain Pain Assessment: Faces Faces Pain Scale: Hurts even more Pain Location: Rt ankle at ulcer Pain Descriptors / Indicators: Aching;Sore Pain Intervention(s): Monitored during session;Repositioned    Home Living Family/patient expects to be discharged to:: Private residence Living Arrangements: Other relatives (Grandson) Available Help at Discharge: Family;Personal care attendant;Available PRN/intermittently (Aide in am for 2 hrs; grandson works - helps pm) Type of Home: [place] Access: Level entry     Home Layout: One level Home Equipment: Environmental consultant - 2 wheels;Wheelchair - manual;Hospital bed;Shower seat;Cane - quad;Grab bars - toilet;Electric scooter Additional Comments: Patient is home alone during part of day.    Prior Function Level of Independence: Needs assistance   Gait / Transfers Assistance Needed: Patient using scooter for mobility.  Aide and grandson assist with transfers in am and pm.  ADL's / Homemaking Assistance Needed: Aide assists 2 hours  in am with bathing, dressing, and other ADL's as needed.        Hand Dominance   Dominant Hand: Left    Extremity/Trunk Assessment   Upper Extremity Assessment Upper Extremity Assessment: Defer to OT evaluation    Lower Extremity Assessment Lower Extremity Assessment: RLE deficits/detail RLE Deficits / Details: Decreased strength grossly 2+/5 at hip/knee, 2-/5 DF/PF RLE Coordination: decreased gross motor        Communication   Communication: Expressive difficulties (Uses head nods with yes/no; uses writing to communicate.)  Cognition Arousal/Alertness: Awake/alert Behavior During Therapy: WFL for tasks assessed/performed Overall Cognitive Status: Within Functional Limits for tasks assessed                                        General Comments      Exercises     Assessment/Plan    PT Assessment Patient needs continued PT services  PT Problem List Decreased strength;Decreased balance;Decreased mobility;Cardiopulmonary status limiting activity;Decreased skin integrity;Pain       PT Treatment Interventions DME instruction;Functional mobility training;Therapeutic activities;Balance training;Patient/family education    PT Goals (Current goals can be found in the Care Plan section)  Acute Rehab PT Goals Patient Stated Goal: Return home PT Goal Formulation: With patient Time For Goal Achievement: 07/20/16 Potential to Achieve Goals: Good    Frequency Min 3X/week   Barriers to discharge Decreased caregiver support Yolanda Bonine works and Aide 2 hrs/day. Patient alone during day.    Co-evaluation               AM-PAC PT "6 Clicks" Daily Activity  Outcome Measure Difficulty turning over in bed (including adjusting bedclothes, sheets and blankets)?: None Difficulty moving from lying on back to sitting on the side of the bed? : Total Difficulty sitting down on and standing up from a chair with arms (e.g., wheelchair, bedside commode, etc,.)?: Total Help needed moving to and from a bed to chair (including a wheelchair)?: A Lot Help needed walking in hospital room?: Total Help needed climbing 3-5 steps with a railing? : Total 6 Click Score: 10    End of Session Equipment Utilized During Treatment: Gait belt Activity Tolerance: Patient tolerated treatment well Patient left: in bed;with call bell/phone within reach;with bed alarm set Nurse Communication: Mobility  status (Uncertain of d/c plan) PT Visit Diagnosis: Unsteadiness on feet (R26.81);Hemiplegia and hemiparesis;Pain;Other abnormalities of gait and mobility (R26.89) Hemiplegia - Right/Left: Right Hemiplegia - dominant/non-dominant: Non-dominant Hemiplegia - caused by: Cerebral infarction Pain - Right/Left: Right Pain - part of body: Ankle and joints of foot    Time: 8088-1103 PT Time Calculation (min) (ACUTE ONLY): 25 min   Charges:   PT Evaluation $PT Eval Moderate Complexity: 1 Procedure PT Treatments $Therapeutic Activity: 8-22 mins   PT G Codes:        Carita Pian. Sanjuana Kava, [facility] Acute Rehab Services Pager [phone]   Despina Pole 07/13/2016, 11:04 PM

## 2016-07-13 NOTE — Consult Note (Signed)
[facility] Nurse wound consult note Reason for Consult:full thickness wound over right malleolus, not pressure Wound type:venous insufficiency, possible trauma Pressure Injury POA: No. Wound POA, but is not pressure. Measurement:0.5cm round x 0.2cm Wound bed:red, moist Drainage (amount, consistency, odor) scant serous Periwound:intact dry.  Evidence of previous wound healing distally. Dressing procedure/placement/frequency: I will approach conservatively, implementing a twice daily saline dressing and elevation to support reepithelialization. Please add HHRN to consultation for follow up on wound resolution and to determine if patient compliance with recommendation for elevation is not likely. In this case, compression (wraps or stockings, if she has a care provider) may be required. [place] nursing team will not follow, but will remain available to this patient, the nursing and medical teams.  Please re-consult if needed. Thanks, Maudie Flakes, MSN, RN, [place], Arther Abbott  Pager# [phone]

## 2016-07-13 NOTE — Progress Notes (Signed)
Patient having  difficulty swallowing meds but able to swallow food.  .MD notified and ordered swallowing and speech evaluation.

## 2016-07-14 DIAGNOSIS — E0865 Diabetes mellitus due to underlying condition with hyperglycemia: Secondary | ICD-10-CM

## 2016-07-14 LAB — GLUCOSE, CAPILLARY
GLUCOSE-CAPILLARY: 189 mg/dL — AB (ref 65–99)
GLUCOSE-CAPILLARY: 118 mg/dL — AB (ref 65–99)
Glucose-Capillary: 403 mg/dL — ABNORMAL HIGH (ref 65–99)
Glucose-Capillary: 395 mg/dL — ABNORMAL HIGH (ref 65–99)
Glucose-Capillary: 311 mg/dL — ABNORMAL HIGH (ref 65–99)

## 2016-07-14 LAB — BASIC METABOLIC PANEL
ANION GAP: 9 (ref 5–15)
Anion gap: 8 (ref 5–15)
BUN: 17 mg/dL (ref 6–20)
BUN: 17 mg/dL (ref 6–20)
CALCIUM: 8.8 mg/dL — AB (ref 8.9–10.3)
CO2: 28 mmol/L (ref 22–32)
CO2: 28 mmol/L (ref 22–32)
CREATININE: 0.91 mg/dL (ref 0.44–1.00)
Calcium: 9.4 mg/dL (ref 8.9–10.3)
Chloride: 101 mmol/L (ref 101–111)
Chloride: 99 mmol/L — ABNORMAL LOW (ref 101–111)
Creatinine, Ser: 0.87 mg/dL (ref 0.44–1.00)
GFR calc Af Amer: >60 mL/min (ref >60)
GFR calc non Af Amer: >60 mL/min (ref >60)
GFR calc non Af Amer: >60 mL/min (ref >60)
GLUCOSE: 399 mg/dL — AB (ref 65–99)
Glucose, Bld: 181 mg/dL — ABNORMAL HIGH (ref 65–99)
POTASSIUM: 3.9 mmol/L (ref 3.5–5.1)
Potassium: 3.4 mmol/L — ABNORMAL LOW (ref 3.5–5.1)
Sodium: 137 mmol/L (ref 135–145)
Sodium: 136 mmol/L (ref 135–145)

## 2016-07-14 MED ORDER — POTASSIUM CHLORIDE CRYS ER 20 MEQ PO TBCR
40.0000 meq | EXTENDED_RELEASE_TABLET | Freq: Every day | ORAL | Status: DC
  Administered 2016-07-14 – 2016-07-17 (×3): 40 meq via ORAL
  Filled 2016-07-14 (×4): qty 2

## 2016-07-14 MED ORDER — INSULIN ASPART 100 UNIT/ML ~~LOC~~ SOLN
3.0000 [IU] | Freq: Three times a day (TID) | SUBCUTANEOUS | Status: DC
  Administered 2016-07-14 – 2016-07-15 (×2): 3 [IU] via SUBCUTANEOUS

## 2016-07-14 MED ORDER — ENSURE ENLIVE PO LIQD
237.0000 mL | Freq: Two times a day (BID) | ORAL | Status: DC
  Administered 2016-07-17 (×2): 237 mL via ORAL

## 2016-07-14 MED ORDER — INSULIN GLARGINE 100 UNIT/ML ~~LOC~~ SOLN
18.0000 [IU] | Freq: Every day | SUBCUTANEOUS | Status: DC
  Administered 2016-07-15: 18 [IU] via SUBCUTANEOUS
  Filled 2016-07-14: qty 0.18

## 2016-07-14 MED ORDER — INSULIN GLARGINE 100 UNIT/ML ~~LOC~~ SOLN: 20.0000 [IU] | Freq: Every day | SUBCUTANEOUS | Status: DC

## 2016-07-14 MED ORDER — DOXYCYCLINE HYCLATE 100 MG PO TABS
100.0000 mg | ORAL_TABLET | Freq: Two times a day (BID) | ORAL | Status: DC
  Administered 2016-07-14 – 2016-07-15 (×4): 100 mg via ORAL
  Filled 2016-07-14 (×5): qty 1

## 2016-07-14 NOTE — Progress Notes (Signed)
Paged MD regarding pt recheck CBG [phone], awaiting call back  Paige Monarrez Leory Plowman

## 2016-07-14 NOTE — Progress Notes (Signed)
MD called to check on pt CBG. MD aware of 1444 blood sugar result of 395 and 1602 blood sugar result of 311. MD  stated to check pt blood sugar at 2 AM to see if CBG has decreased after long acting insulin administered  [name] [name]

## 2016-07-14 NOTE — Progress Notes (Signed)
Paged MD regarding pt CBG [phone]. Requesting additional insulin or confirmation via blood draw.   [name] [name]

## 2016-07-14 NOTE — Clinical Social Work Note (Signed)
CSW met with patient. No supports at bedside. CSW introduced role and explained that PT recommendations would be discussed. Patient is nonverbal but nods appropriately and writes on a piece of paper when her answer requires more than a yes or no. Patient refusing SNF. She prefers to return home with HHPT which she states she has already been getting. Patient reports living home with her grandson. RNCM notified.  CSW signing off. Consult again if any other social work needs arise.  Dayton Scrape, [place]

## 2016-07-14 NOTE — Progress Notes (Signed)
  Speech Language Pathology Treatment: Dysphagia  Patient Details Name: [name] [name] MRN: [id number] DOB: 25-Aug-1940 Today's Date: 07/14/2016 Time: 8546-2703 SLP Time Calculation (min) (ACUTE ONLY): 26 min  Assessment / Plan / Recommendation Clinical Impression  Swallowing function at baseline with skilled observation of po intake revealing continued severe bilateral lingual weakness resulting in decreased bolus cohesion and posterior propulsion, oral residuals, use of teeth and then posterior tongue to masticate solids, and moderate to severe anterior labial spillage. Patient compensating independently by modifying solid textures (dipping cracker in thin liquid), limiting bolus size, and taking liquid washes to assist in oral clearance with seemingly adequate airway protection. One episode of wet vocal quality noted clearing with spontaneous hard cough response. When questioned regarding baseline swallowing function, patient did not note any acute changes. Has been tolerating diet from a respiratory standpoint per additional questioning. Will continue to f/u briefly for benefit of further compensatory strategies, possible adaptive tools to facilitate improved oral control and less messy po intake as patient communicating via writing "I feel like an animal when Im eating."   HPI HPI: 76 year old female who is chronically and anarthric S/P bilateral corona radiata CVAs - writes to communicate , hypertension, hyperlipidemia, diastolic CHF and diabetes mellitus brought to the ED by EMS with worsening bilateral lower extremity swelling. Also noted draining right lateral malleolus wound for past 2-3 days.  Dx acute on chronic diastolic CHF, ankle ulcer, DM.  Swallow evaluation ordered due to difficulty swallowing pills per MD notes. Pt has had multiple swallowing assessments in the past; most recent MBS 12/03/15 revealed severe oral and mld pharyngeal dysphagia; aspiration with cough noted with thin liquids.   Chin tuck effectively prevented aspiration.       SLP Plan  Continue with current plan of care       Recommendations  Diet recommendations: Regular;Thin liquid Liquids provided via: Cup Medication Administration: Whole meds with liquid Supervision: Patient able to self feed Compensations: Slow rate;Small sips/bites Postural Changes and/or Swallow Maneuvers: Seated upright 90 degrees                Oral Care Recommendations: Oral care BID Follow up Recommendations: None SLP Visit Diagnosis: Dysphagia, oropharyngeal phase (R13.12) Plan: Continue with current plan of care       [place], [place] [phone]     [name] [name] [name] [name] 07/14/2016, 12:18 PM

## 2016-07-14 NOTE — Progress Notes (Signed)
Inpatient Diabetes Program Recommendations  AACE/ADA: New Consensus Statement on Inpatient Glycemic Control (2015)  Target Ranges:  Prepandial:   less than 140 mg/dL      Peak postprandial:   less than 180 mg/dL (1-2 hours)      Critically ill patients:  140 - 180 mg/dL   Results for CESIAH, WESTLEY (MRN [id number]) as of 07/14/2016 08:05  Ref. Range 07/13/2016 07:34 07/13/2016 11:40 07/13/2016 16:31 07/13/2016 21:19 07/14/2016 07:46  Glucose-Capillary Latest Ref Range: 65 - 99 mg/dL 256 (H) 351 (H) 167 (H) 291 (H) 189 (H)   Review of Glycemic Control  Outpatient Diabetes medications: Lantus 11 units QHS, Novolog 11 units TID with meals Current orders for Inpatient glycemic control: Lantus 11 units daily, Novolog 0-15 units TID with meals, Novolog 0-5 units QHS  Inpatient Diabetes Program Recommendations: Insulin - Basal: Please consider increasing Lantus to 12 units daily. Insulin - Meal Coverage: Please consider ordering Novolog 5 units TID with meals for meal coverage if patient eats at least 50% of meals.  Thanks, Barnie Alderman, RN, MSN, CDE Diabetes Coordinator Inpatient Diabetes Program [phone] (Team Pager from 8am to 5pm)

## 2016-07-14 NOTE — Progress Notes (Signed)
Initial Nutrition Assessment  DOCUMENTATION CODES:   Non-severe (moderate) malnutrition in context of chronic illness  INTERVENTION:   Ensure Enlive po BID, each supplement provides 350 kcal and 20 grams of protein  NUTRITION DIAGNOSIS:   Malnutrition (moderate) related to catabolic illness, other (see comment) (stroke) as evidenced by moderate depletions of muscle mass, 15 percent weight loss in 7 months.  GOAL:   Patient will meet greater than or equal to 90% of their needs  MONITOR:   PO intake, Supplement acceptance, Labs, Weight trends  REASON FOR ASSESSMENT:   Consult Assessment of nutrition requirement/status  ASSESSMENT:   76 year old female who is nonverbal following a stroke, hypertension, hyperlipidemia, diastolic CHF and diabetes mellitus brought to the ED by EMS with worsening bilateral lower extremity swelling. Also noted draining right lateral malleolus wound for past 2-3 days.   Met with pt in room today. Pt reports poor appetite for several months pta. Per chart, pt has lost 27lbs(15%) in 7 months; this is significant. Pt currently eating 65-75% meals. Pt likes Ensure; RD will order. Pt was educated today regarding a heart healthy diet. Pt with hypokalemia; monitor and supplement as needed per MD discretion.   Medications reviewed and include: Vit D, plavix, lovenox, lasix, insulin, KCl  Labs reviewed: K 3.4(L), Ca 8.8(L) Wbc- 3.9(L) cbgs- 263, 219, 181 x 48hrs AIC 7.3(H)- 11/2015  Nutrition-Focused physical exam completed. Findings are no fat depletion, moderate muscle depletion in clavicles and lower legs, and mild edema in BLE.   Diet Order:  Diet Heart Room service appropriate? Yes; Fluid consistency: Thin; Fluid restriction: 1200 mL Fluid  Skin:  Wound (see comment) (Stage II ankle)  Last BM:  5/19  Height:   Ht Readings from Last 1 Encounters:  07/12/16 _0  (1.575 m)    Weight:   Wt Readings from Last 1 Encounters:  07/14/16 156 lb 4.8  oz (70.9 kg)    Ideal Body Weight:  50 kg  BMI:  Body mass index is 28.59 kg/m.  Estimated Nutritional Needs:   Kcal:  1500-1700kcal/day  Protein:  75-85g/day   Fluid:  >1.5L/day   EDUCATION NEEDS:   Education needs addressed  Koleen Distance MS, RD, LDN Pager #[phone]

## 2016-07-14 NOTE — Progress Notes (Signed)
PROGRESS NOTE                                                                                                                                                                                                             Patient Demographics:    [name] [name], is a 76 y.o. female, DOB - 04-05-1940, GYJ:[id number]  Admit date - 07/12/2016   Admitting Physician Lady Deutscher, MD  Outpatient Primary MD for the patient is [name], MD  LOS - 2  Outpatient Specialists:  Chief Complaint  Patient presents with  . Leg Swelling  . Cough       Brief Narrative   76 year old female who is nonverbal following a stroke, hypertension, hyperlipidemia, diastolic CHF and diabetes mellitus brought to the ED by EMS with worsening bilateral lower extremity swelling. Also noted draining right lateral malleolus wound for past 2-3 days. Patient is nonverbal so history is limited. She complained of ongoing cough for almost 2 months. No change in her medications and reportedly adherent to them. Patient found to be in acute diastolic CHF. BNP was elevated at 810 and chest x-ray concerning for fluid overload. She received 80 mg IV Lasix in the ED and admitted to telemetry.   Subjective:   Patient complains of pain in her right ankle wound with purulent discharge overnight.   Assessment  & Plan :    Principal Problem:   Acute on chronic Diastolic CHF ([place]) Echo from 11/2015 with normal EF, basal inferior wall hypokinesis and grade 1 diastolic resumption. Diuresing well on IV Lasix 40 mg twice a day. Symptoms better. Continue strict I/O and daily weight. -Resumed metoprolol (taken off beta blocker for reasons unclear). Continue  lisinopril and statin.    Active Problems:   Ankle ulcer, right, limited to breakdown of skin (HCC) Complains of pain and reports some purulent discharge overnight. Seen by wound care and recommend twice a day  saline dressing and elevation to support a epithelization. Will add empiric doxycycline.  Diabetes mellitus with hyperglycemia  Uncontrolled. CBG of 400s this morning. Will increase Lantus to 18 units at bedtime and add pre-meal aspart 3 units 3 times a day. Check A1C.  Essential hypertension Blood pressure stable. Continue home medications.     CAD (coronary artery disease), native coronary artery Continue Plavix, statin. Added beta blocker.  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 possibly tomorrow  Barriers For Discharge : Active symptoms, CHF, uncontrolled diabetes  Consults  :   Wound care  Procedures  : None  DVT Prophylaxis  :  Lovenox -  Lab Results  Component Value Date   PLT 187 07/12/2016    Antibiotics  :   Anti-infectives    Start     Dose/Rate Route Frequency Ordered Stop   07/14/16 1200  doxycycline (VIBRA-TABS) tablet 100 mg     100 mg Oral Every 12 hours 07/14/16 1156          Objective:   Vitals:   07/14/16 0118 07/14/16 0638 07/14/16 1000 07/14/16 1152  BP: (!) 93/54 (!) 123/56 (!) 108/50 129/63  Pulse: (!) 57 73 61 (!) 54  Resp: 18 18 16 18   Temp: 97.8 F (36.6 C) 97.6 F (36.4 C)  97.7 F (36.5 C)  TempSrc: Oral Oral  Oral  SpO2: 97% 98%  97%  Weight:  70.9 kg (156 lb 4.8 oz)    Height:        Wt Readings from Last 3 Encounters:  07/14/16 70.9 kg (156 lb 4.8 oz)  12/04/15 83.1 kg (183 lb 1.6 oz)  01/28/14 78.8 kg (173 lb 11.2 oz)     Intake/Output Summary (Last 24 hours) at 07/14/16 1156 Last data filed at 07/14/16 0600  Gross per 24 hour  Intake              580 ml  Output                0 ml  Net              580 ml     Physical  Exam Gen.: Elderly female, nonverbal, complains of pain in her right ankle HEENT: Moist mucosa, supple neck Chest: Improved bilateral breath sounds  CVS: Normal S1 and S2, no murmurs GI: Soft, nondistended, nontender [place]: Warm, 1+ pitting bilaterally, 0.5 cm ulcers over right lateral malleolus, no bleeding or discharge, mild tenderness    Data Review:    CBC  Recent Labs Lab 07/12/16 1206  WBC 3.9*  HGB 10.1*  HCT 33.7*  PLT 187  MCV 88.0  MCH 26.4  MCHC 30.0  RDW 16.3*  LYMPHSABS 1.1  MONOABS 0.4  EOSABS 0.1  BASOSABS 0.0    Chemistries   Recent Labs Lab 07/12/16 1206 07/13/16 0340 07/14/16 0348  NA 139 139 137  K 4.1 3.7 3.4*  CL 107 101 101  CO2 26 28 28   GLUCOSE 263* 219* 181*  BUN 9 12 17   CREATININE 0.74 0.99 0.91  CALCIUM 9.1 9.2 8.8*  AST 21  --   --   ALT 17  --   --   ALKPHOS 68  --   --   BILITOT 0.7  --   --    ------------------------------------------------------------------------------------------------------------------ No results for input(s): CHOL, HDL, LDLCALC, TRIG, CHOLHDL, LDLDIRECT in the last 72 hours.  Lab Results  Component Value Date   HGBA1C 7.3 (H) 12/02/2015   ------------------------------------------------------------------------------------------------------------------ No results for input(s): TSH, T4TOTAL, T3FREE, THYROIDAB in the last 72 hours.  Invalid input(s): FREET3 ------------------------------------------------------------------------------------------------------------------ No results for  input(s): VITAMINB12, FOLATE, FERRITIN, TIBC, IRON, RETICCTPCT in the last 72 hours.  Coagulation profile No results for input(s): INR, PROTIME in the last 168 hours.  No results for input(s): DDIMER in the last 72 hours.  Cardiac Enzymes No results for input(s): CKMB, TROPONINI, MYOGLOBIN in the last 168 hours.  Invalid input(s):  CK ------------------------------------------------------------------------------------------------------------------    Component Value Date/Time   BNP 974.1 (H) 07/13/2016 0340    Inpatient Medications  Scheduled Meds: . anastrozole  1 mg Oral Daily  . cholecalciferol  1,000 Units Oral Daily  . clopidogrel  75 mg Oral Daily  . doxycycline  100 mg Oral Q12H  . enoxaparin (LOVENOX) injection  40 mg Subcutaneous Q24H  . escitalopram  20 mg Oral Daily  . furosemide  40 mg Intravenous BID  . gabapentin  100 mg Oral TID  . insulin aspart  0-15 Units Subcutaneous TID WC  . insulin aspart  0-5 Units Subcutaneous QHS  . insulin glargine  11 Units Subcutaneous Daily  . lisinopril  5 mg Oral Daily  . metoprolol tartrate  12.5 mg Oral BID  . potassium chloride SA  40 mEq Oral Daily  . rosuvastatin  40 mg Oral Daily  . sodium chloride flush  3 mL Intravenous Q12H   Continuous Infusions: . sodium chloride     PRN Meds:.sodium chloride, acetaminophen, nitroGLYCERIN, ondansetron (ZOFRAN) IV, sodium chloride flush, zolpidem  Micro Results No results found for this or any previous visit (from the past 240 hour(s)).  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 Cardiac silhouette is mildly enlarged. No mediastinal or hilar masses. No evidence of adenopathy. There are prominent bronchovascular markings bilaterally similar to the prior study. There is opacity at the right lung base partly silhouetting the hemidiaphragm consistent with small pleural effusion. There is thickening along the fissures. No evidence of pneumonia.  No pneumothorax. Skeletal structures are demineralized. There is a mild wedge-shaped deformity at the thoracolumbar  junction stable from a lateral chest radiograph dated 08/03/2015. IMPRESSION: 1. Mild cardiomegaly, prominent bronchovascular markings, thickening of the fissures and small right pleural effusion. Findings are similar to prior studies, suggesting a mild degree of chronic congestive heart failure. No evidence of pneumonia. Electronically Signed   By: Lajean Manes M.D.   On: 07/12/2016 12:33   Dg Ankle Complete Right  Result Date: 07/12/2016 CLINICAL DATA:  Patient reports she has had a wound that has been getting worse X 1 month, patient denies any chest complaints at this time HX stroke, CHF EXAM: RIGHT ANKLE - COMPLETE 3+ VIEW COMPARISON:  12/18/2014 FINDINGS: No fracture. There is a chronic area of bony prominence and sclerosis along the anterolateral aspect of the distal tibia. There is no bone resorption to suggest osteomyelitis. Bones are demineralized. The ankle mortise is normally spaced and aligned. There is diffuse soft tissue swelling.  No soft tissue air. IMPRESSION: 1. No fracture or dislocation. 2. No evidence of osteomyelitis. Electronically Signed   By: Lajean Manes M.D.   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 Cardiac silhouette is mildly  enlarged. No mediastinal hilar masses. Prominent bronchovascular markings and interstitial thickening has mildly improved. There is hazy opacity at the right lung base consistent with atelectasis possibly with a small effusion, stable. Mild thickening of the minor fissure appears less prominent than the previous day's study. No lung consolidation is seen to suggest pneumonia. There is no pneumothorax. IMPRESSION: 1. Mild improvement in lung aeration from the previous day's exam consistent with improved congestive heart failure/interstitial edema. No new abnormalities. Electronically  Signed   By: Lajean Manes M.D.   On: 07/13/2016 07:40    Time Spent in minutes  25   Louellen Molder M.D on 07/14/2016 at 11:56 AM  Between 7am to 7pm - Pager - [phone]  After 7pm go to [email or web address] - password [facility]  [facility] -  Office  [phone]

## 2016-07-14 NOTE — Progress Notes (Signed)
Nutrition Education Note  RD consulted for nutrition education regarding new onset CHF.  RD provided "Low Sodium Nutrition Therapy" handout from the Academy of [employer]. Reviewed patient's dietary recall. Provided examples on ways to decrease sodium intake in diet. Discouraged intake of processed foods and use of salt shaker. Encouraged fresh fruits and vegetables as well as whole grain sources of carbohydrates to maximize fiber intake.   RD discussed why it is important for patient to adhere to diet recommendations, and emphasized the role of fluids, foods to avoid, and importance of weighing self daily. Teach back method used.  Expect fair compliance.  Body mass index is 28.59 kg/m. Pt meets criteria for normal weight based on current BMI.  Current diet order is heart healthy, patient is consuming approximately 75% of meals at this time. Labs and medications reviewed.   RD following this pt  [name] Distance MS, RD, LDN Pager #[phone]

## 2016-07-14 NOTE — Progress Notes (Signed)
Occupational Therapy Evaluation Patient Details Name: [name] [name] MRN: [id number] DOB: [birth year] Today's Date: 07/14/2016    History of Present Illness Patient is a 76 yo female admitted 07/12/16 with CHF exacerbation.    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 At baseline, pt is able to complete a squat pivot transfer into her power chair (placed on her strong side) and then stays in her chair until her PCA comes to help her with ADL tasks. Pt close to baseline level. Recommend follow up with HHOT to further assess home needs and reduce risk of readmission. Pt very appreciative. OT signing off.     Follow Up Recommendations  Home health OT;Supervision - Intermittent    Equipment Recommendations  None recommended by OT    Recommendations for Other Services       Precautions / Restrictions Precautions Precautions: Fall Restrictions Weight Bearing Restrictions: No      Mobility Bed Mobility Overal bed mobility: Modified Independent                Transfers Overall transfer level: Needs assistance Equipment used: 1 person hand held assist Transfers: Squat Pivot Transfers     Squat pivot transfers: Min assist          Balance     Sitting balance-Leahy Scale: Fair     Standing balance support: Bilateral upper extremity supported (around pt with gait belt) Standing balance-Leahy Scale: Poor                             ADL either performed or assessed with clinical judgement   ADL Overall ADL's : At baseline    Incontinenet at baseline. Completes simple grroming tasks and assist with ADL as she is able.                                          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)   Lower Extremity Assessment RLE Deficits / Details: Decreased strength grossly 2+/5 at hip/knee, 2-/5 DF/PF RLE Coordination: decreased gross motor       Communication Communication Communication: Expressive difficulties (Uses head nods with yes/no; uses writing to communicate.)   Cognition Arousal/Alertness: Awake/alert Behavior During Therapy: WFL for tasks assessed/performed Overall Cognitive Status: Within Functional Limits for tasks assessed                                     General Comments       Exercises     Shoulder Instructions      Home Living Family/patient expects to be discharged to:: Private residence Living Arrangements: Other relatives (Grandson) Available Help at Discharge: Family;Personal care attendant;Available PRN/intermittently (Aide in am for 2 hrs; grandson works - helps pm) Type of Home: Apartment Home Access: Level entry     Home Layout: One level  Bathroom Shower/Tub: Teacher, early years/pre: Standard Bathroom Accessibility: Yes   Home Equipment: Environmental consultant - 2 wheels;Wheelchair - manual;Hospital bed;Shower seat;Cane - quad;Grab bars - toilet;Electric scooter   Additional Comments: Patient is home alone during part of day.      Prior Functioning/Environment Level of Independence: Needs assistance  Gait / Transfers Assistance Needed: Patient using scooter for mobility.  Aide and grandson assist with transfers in am and pm. ADL's / Homemaking Assistance Needed: Aide assists 2 hours in am with bathing, dressing, and other ADL's as needed. Communication / Swallowing Assistance Needed: expressive aphasia, communicates with writing or head shakes (yes/no)          OT Problem List: Decreased activity tolerance;Decreased strength;Cardiopulmonary status limiting activity      OT  Treatment/Interventions:      OT Goals(Current goals can be found in the care plan section) Acute Rehab OT Goals Patient Stated Goal: writing her comments  OT Goal Formulation: All assessment and education complete, DC therapy (continue with HHOT)  OT Frequency:     Barriers to D/C:            Co-evaluation              AM-PAC PT "6 Clicks" Daily Activity     Outcome Measure Help from another person eating meals?: A Little Help from another person taking care of personal grooming?: A Little Help from another person toileting, which includes using toliet, bedpan, or urinal?: A Lot Help from another person bathing (including washing, rinsing, drying)?: A Lot Help from another person to put on and taking off regular upper body clothing?: A Lot Help from another person to put on and taking off regular lower body clothing?: A Lot 6 Click Score: 14   End of Session Equipment Utilized During Treatment: Gait belt Nurse Communication: Mobility status  Activity Tolerance: Patient tolerated treatment well Patient left: in bed;with call bell/phone within reach;with bed alarm set  OT Visit Diagnosis: Unsteadiness on feet (R26.81);Muscle weakness (generalized) (M62.81)                Time: 1610-9604 OT Time Calculation (min): 24 min Charges:  OT General Charges $OT Visit: 1 Procedure OT Evaluation $OT Eval Moderate Complexity: 1 Procedure OT Treatments $Self Care/Home Management : 8-22 mins G-Codes:     [facility], OT/L  [phone] 07/14/2016  [name] [name],[name] [name] 07/14/2016, 4:53 PM

## 2016-07-14 NOTE — Progress Notes (Signed)
Physical Therapy Treatment Patient Details Name: [name] [name] MRN: [id number] DOB: Sep 29, 1940 Today's Date: 07/14/2016    History of Present Illness Patient is a 76 yo female admitted 07/12/16 with CHF exacerbation.    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  Pt is apparently previously home alone but cannot presently transfer without max assist.  Will revise recommendations to SNF until further family information can be obtained about having 24/7 help for her dependent movement.  Continue acutely with all strength and transfer skills, and will progress her as possible to minimize her need for inpt care.   Follow Up Recommendations  SNF     Equipment Recommendations  None recommended by PT    Recommendations for Other Services       Precautions / Restrictions Precautions Precautions: Fall Restrictions Weight Bearing Restrictions: No    Mobility  Bed Mobility Overal bed mobility: Needs Assistance Bed Mobility: Supine to Sit     Supine to sit: Min assist     General bed mobility comments: scooting to EOB and then positioning for transfer  Transfers Overall transfer level: Needs assistance Equipment used: 1 person hand held assist Transfers: Sit to/from [employer] Transfers Sit to Stand: Max assist Stand pivot transfers: Max assist       General transfer comment: max for up to stand then mod to pivot to chair  Ambulation/Gait             General Gait Details: unable   Stairs            Wheelchair Mobility    Modified Rankin (Stroke Patients Only)       Balance Overall balance assessment: Needs assistance Sitting-balance support: Feet supported Sitting balance-Leahy Scale: Fair     Standing balance support: Bilateral upper extremity supported (around pt with  gait belt) Standing balance-Leahy Scale: Poor                              Cognition Arousal/Alertness: Awake/alert Behavior During Therapy: WFL for tasks assessed/performed Overall Cognitive Status: Within Functional Limits for tasks assessed                                        Exercises General Exercises - Lower Extremity Ankle Circles/Pumps: AAROM;Both;5 reps Long Arc Quad: AAROM;AROM;Both;10 reps Heel Slides: AAROM;AROM;Both;10 reps Hip ABduction/ADduction: AROM;AAROM;Both;10 reps    General Comments        Pertinent Vitals/Pain Pain Assessment: Faces Faces Pain Scale: Hurts even more Pain Location: R foot ulcer Pain Descriptors / Indicators: Aching;Sore Pain Intervention(s): Limited activity within patient's tolerance;Monitored during session;Premedicated before session;Repositioned    Home Living                      Prior Function            PT Goals (current goals can now be found in the care plan section) Acute Rehab PT Goals Patient Stated Goal: writing her comments  Progress towards PT goals: Progressing toward goals    Frequency    Min 3X/week      PT Plan Discharge plan needs to be  updated    Co-evaluation              AM-PAC PT "6 Clicks" Daily Activity  Outcome Measure  Difficulty turning over in bed (including adjusting bedclothes, sheets and blankets)?: Total Difficulty moving from lying on back to sitting on the side of the bed? : Total Difficulty sitting down on and standing up from a chair with arms (e.g., wheelchair, bedside commode, etc,.)?: Total Help needed moving to and from a bed to chair (including a wheelchair)?: A Lot Help needed walking in hospital room?: Total Help needed climbing 3-5 steps with a railing? : Total 6 Click Score: 7    End of Session Equipment Utilized During Treatment: Gait belt Activity Tolerance: Patient tolerated treatment well Patient left: in chair;with  call bell/phone within reach;with chair alarm set Nurse Communication: Mobility status PT Visit Diagnosis: Unsteadiness on feet (R26.81);Hemiplegia and hemiparesis;Pain;Other abnormalities of gait and mobility (R26.89) Hemiplegia - Right/Left: Right Hemiplegia - dominant/non-dominant: Non-dominant Hemiplegia - caused by: Cerebral infarction Pain - Right/Left: Right Pain - part of body: Ankle and joints of foot     Time: 7673-4193 PT Time Calculation (min) (ACUTE ONLY): 26 min  Charges:  $Therapeutic Exercise: 8-22 mins $Therapeutic Activity: 8-22 mins                    G Codes:  Functional Assessment Tool Used: AM-PAC 6 Clicks Basic Mobility     Ramond Dial 07/14/2016, 12:04 PM   Mee Hives, PT MS Acute Rehab Dept. Number: [place] [facility] [place]

## 2016-07-14 NOTE — Progress Notes (Signed)
MD returned page stated he will place orders and to recheck pt accucheck in 2 hours and give 15 units of sliding scale  [name] [name] Leory Plowman

## 2016-07-14 NOTE — Progress Notes (Signed)
1440 Noted from chart that pt needs max asst to transfer and that nutritionist has been in to discuss low sodium diet. Since pt is more appropriate for PT services, we will sign off. Graylon Good RN BSN 07/14/2016 2:39 PM

## 2016-07-15 ENCOUNTER — Other Ambulatory Visit: Payer: Self-pay | Admitting: *Deleted

## 2016-07-15 DIAGNOSIS — E1165 Type 2 diabetes mellitus with hyperglycemia: Secondary | ICD-10-CM

## 2016-07-15 DIAGNOSIS — E440 Moderate protein-calorie malnutrition: Secondary | ICD-10-CM | POA: Insufficient documentation

## 2016-07-15 LAB — BASIC METABOLIC PANEL
Anion gap: 10 (ref 5–15)
BUN: 20 mg/dL (ref 6–20)
CALCIUM: 9.2 mg/dL (ref 8.9–10.3)
CO2: 27 mmol/L (ref 22–32)
CREATININE: 0.97 mg/dL (ref 0.44–1.00)
Chloride: 101 mmol/L (ref 101–111)
GFR, EST NON AFRICAN AMERICAN: 56 mL/min — AB (ref >60)
Glucose, Bld: 163 mg/dL — ABNORMAL HIGH (ref 65–99)
Potassium: 3.8 mmol/L (ref 3.5–5.1)
SODIUM: 138 mmol/L (ref 135–145)

## 2016-07-15 LAB — HEMOGLOBIN A1C
HEMOGLOBIN A1C: 7.5 % — AB (ref 4.8–5.6)
MEAN PLASMA GLUCOSE: 169 mg/dL

## 2016-07-15 LAB — GLUCOSE, CAPILLARY
GLUCOSE-CAPILLARY: 160 mg/dL — AB (ref 65–99)
GLUCOSE-CAPILLARY: 374 mg/dL — AB (ref 65–99)
Glucose-Capillary: 126 mg/dL — ABNORMAL HIGH (ref 65–99)
Glucose-Capillary: 335 mg/dL — ABNORMAL HIGH (ref 65–99)

## 2016-07-15 MED ORDER — INSULIN GLARGINE 100 UNIT/ML ~~LOC~~ SOLN
12.0000 [IU] | Freq: Every day | SUBCUTANEOUS | Status: DC
  Administered 2016-07-16: 12 [IU] via SUBCUTANEOUS
  Filled 2016-07-15: qty 0.12

## 2016-07-15 MED ORDER — INSULIN ASPART 100 UNIT/ML ~~LOC~~ SOLN
10.0000 [IU] | Freq: Three times a day (TID) | SUBCUTANEOUS | Status: DC
  Administered 2016-07-15 – 2016-07-17 (×5): 10 [IU] via SUBCUTANEOUS

## 2016-07-15 MED ORDER — TRAMADOL-ACETAMINOPHEN 37.5-325 MG PO TABS
1.0000 | ORAL_TABLET | Freq: Four times a day (QID) | ORAL | Status: DC | PRN
  Administered 2016-07-15 – 2016-07-17 (×3): 1 via ORAL
  Filled 2016-07-15 (×4): qty 1

## 2016-07-15 NOTE — Care Management Important Message (Signed)
Important Message  Patient Details  Name: [name] [name] MRN: [id number] Date of Birth: 1940-05-30   Medicare Important Message Given:  Yes    Alaia Lordi Montine Circle 07/15/2016, 11:36 AM

## 2016-07-15 NOTE — Progress Notes (Addendum)
PROGRESS NOTE                                                                                                                                                                                                             Patient Demographics:    [name] [name], is a 76 y.o. female, DOB - 07-21-1940, MAY:[id number]  Admit date - 07/12/2016   Admitting Physician Lady Deutscher, MD  Outpatient Primary MD for the patient is [name], MD  LOS - 3  Outpatient Specialists:  Chief Complaint  Patient presents with  . Leg Swelling  . Cough       Brief Narrative   76 year old female who is nonverbal following a stroke, hypertension, hyperlipidemia, diastolic CHF and diabetes mellitus brought to the ED by EMS with worsening bilateral lower extremity swelling. Also noted draining right lateral malleolus wound for past 2-3 days. Patient is nonverbal so history is limited. She complained of ongoing cough for almost 2 months. No change in her medications and reportedly adherent to them. Patient found to be in acute diastolic CHF. BNP was elevated at 810 and chest x-ray concerning for fluid overload. She received 80 mg IV Lasix in the ED and admitted to telemetry.   Subjective:   Still complaining of pain in her right ankle area. Elevated CBG yesterday, improve this morning.  Assessment  & Plan :    Principal Problem:   Acute on chronic Diastolic CHF ([place]) Echo from 11/2015 with normal EF, basal inferior wall hypokinesis and grade 1 diastolic resumption. Diuresing well on IV Lasix 40 mg twice a day. Symptoms better. Continue strict I/O and daily weight.(Urine output not well monitored). -Resumed metoprolol (taken off beta blocker for reasons unclear). Continue  lisinopril and statin. -Leg edema seems to be getting better. (Has lost about 5 pounds since admission).   Active Problems:   Ankle ulcer, right, limited to breakdown of  skin (HCC) Complains of pain and some purulent discharge. Seen by wound care and recommend twice a day saline dressing and elevation to support a epithelization. -X-ray of the right ankle on admission negative for fracture or osteomyelitis. -Added empiric doxycycline. -Pain not controlled with Tylenol. Added Ultracet.  Diabetes mellitus with hyperglycemia  Uncontrolled with CBG in 400s. A1c of 7.5. Now increase Lantus to 12 units at bedtime and added pre-meal aspart  10 units 3 times a day.  Essential hypertension Blood pressure stable. Continue home medications.     CAD (coronary artery disease), native coronary artery Continue Plavix, statin. Added beta blocker.     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 Social work consulted.   Anemia of chronic disease Hemoglobin stable at baseline     Code Status :Full code  Family Communication  : None at bedside  Disposition Plan  : Discharge to SNF tomorrow if CBG remains stable, right ankle pain improved and diuresing further.  Barriers For Discharge :  Acute CHF, right ankle pain and hyperglycemia  Consults  :   Wound care  Procedures  : None  DVT Prophylaxis  :  Lovenox -  Lab Results  Component Value Date   PLT 187 07/12/2016    Antibiotics  :   Anti-infectives    Start     Dose/Rate Route Frequency Ordered Stop   07/14/16 1200  doxycycline (VIBRA-TABS) tablet 100 mg     100 mg Oral Every 12 hours 07/14/16 1156          Objective:   Vitals:   07/14/16 2020 07/15/16 0521 07/15/16 0800 07/15/16 0915  BP: (!) 103/45 (!) 117/55 (!) 95/55 (!) 105/55  Pulse: 73 62 70 69  Resp: 18 18 16    Temp: 98.7 F (37.1 C) 98.7 F (37.1 C)    TempSrc: Oral Oral    SpO2: 99% 99%    Weight:  70.4 kg (155 lb 1.6 oz)    Height:        Wt Readings from Last 3 Encounters:  07/15/16 70.4 kg (155 lb 1.6 oz)  12/04/15  83.1 kg (183 lb 1.6 oz)  01/28/14 78.8 kg (173 lb 11.2 oz)     Intake/Output Summary (Last 24 hours) at 07/15/16 1009 Last data filed at 07/15/16 0500  Gross per 24 hour  Intake              480 ml  Output                0 ml  Net              480 ml     Physical Exam Elderly female nonverbal, still complaining of pain in her right ankle HEENT: Moist mucosa, supple neck Chest: Fine bibasilar crackles   CVS: Normal S1 and S2, no murmurs GI: Soft, nondistended, nontender Musculoskeletal: Warm, trace pitting edema bilaterally, 0.5 cm ulcer over right lateral malleolus no bleeding or discharge, tender    Data Review:    CBC  Recent Labs Lab 07/12/16 1206  WBC 3.9*  HGB 10.1*  HCT 33.7*  PLT 187  MCV 88.0  MCH 26.4  MCHC 30.0  RDW 16.3*  LYMPHSABS 1.1  MONOABS 0.4  EOSABS 0.1  BASOSABS 0.0    Chemistries   Recent Labs Lab 07/12/16 1206 07/13/16 0340 07/14/16 0348 07/14/16 1235 07/15/16 0525  NA 139 139 137 136 138  K 4.1 3.7 3.4* 3.9 3.8  CL 107 101 101 99* 101  CO2 26 28 28 28 27   GLUCOSE 263* 219* 181* 399* 163*  BUN 9 12 17 17 20   CREATININE 0.74 0.99 0.91 0.87 0.97  CALCIUM 9.1 9.2 8.8* 9.4 9.2  AST 21  --   --   --   --   ALT 17  --   --   --   --  ALKPHOS 68  --   --   --   --   BILITOT 0.7  --   --   --   --    ------------------------------------------------------------------------------------------------------------------ No results for input(s): CHOL, HDL, LDLCALC, TRIG, CHOLHDL, LDLDIRECT in the last 72 hours.  Lab Results  Component Value Date   HGBA1C 7.5 (H) 07/14/2016   ------------------------------------------------------------------------------------------------------------------ No results for input(s): TSH, T4TOTAL, T3FREE, THYROIDAB in the last 72 hours.  Invalid input(s): FREET3 ------------------------------------------------------------------------------------------------------------------ No results for input(s):  VITAMINB12, FOLATE, FERRITIN, TIBC, IRON, RETICCTPCT in the last 72 hours.  Coagulation profile No results for input(s): INR, PROTIME in the last 168 hours.  No results for input(s): DDIMER in the last 72 hours.  Cardiac Enzymes No results for input(s): CKMB, TROPONINI, MYOGLOBIN in the last 168 hours.  Invalid input(s): CK ------------------------------------------------------------------------------------------------------------------    Component Value Date/Time   BNP 974.1 (H) 07/13/2016 0340    Inpatient Medications  Scheduled Meds: . anastrozole  1 mg Oral Daily  . cholecalciferol  1,000 Units Oral Daily  . clopidogrel  75 mg Oral Daily  . doxycycline  100 mg Oral Q12H  . enoxaparin (LOVENOX) injection  40 mg Subcutaneous Q24H  . escitalopram  20 mg Oral Daily  . feeding supplement (ENSURE ENLIVE)  237 mL Oral BID BM  . furosemide  40 mg Intravenous BID  . gabapentin  100 mg Oral TID  . insulin aspart  0-15 Units Subcutaneous TID WC  . insulin aspart  0-5 Units Subcutaneous QHS  . insulin aspart  10 Units Subcutaneous TID WC  . [START ON 07/16/2016] insulin glargine  12 Units Subcutaneous Daily  . lisinopril  5 mg Oral Daily  . metoprolol tartrate  12.5 mg Oral BID  . potassium chloride SA  40 mEq Oral Daily  . rosuvastatin  40 mg Oral Daily  . sodium chloride flush  3 mL Intravenous Q12H   Continuous Infusions: . sodium chloride     PRN Meds:.sodium chloride, acetaminophen, nitroGLYCERIN, ondansetron (ZOFRAN) IV, sodium chloride flush, traMADol-acetaminophen, zolpidem  Micro Results No results found for this or any previous visit (from the past 240 hour(s)).  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  Cardiac silhouette is mildly enlarged. No mediastinal or hilar masses. No evidence of adenopathy. There are prominent bronchovascular markings bilaterally similar to the prior study. There is opacity at the right lung base partly silhouetting the hemidiaphragm consistent with small pleural effusion. There is thickening along the fissures. No evidence of pneumonia.  No pneumothorax. Skeletal structures are demineralized. There is a mild wedge-shaped deformity at the thoracolumbar junction stable from a lateral chest radiograph dated 08/03/2015. IMPRESSION: 1. Mild cardiomegaly, prominent bronchovascular markings, thickening of the fissures and small right pleural effusion. Findings are similar to prior studies, suggesting a mild degree of chronic congestive heart failure. No evidence of pneumonia. Electronically Signed   By: Lajean Manes M.D.   On: 07/12/2016 12:33   Dg Ankle Complete Right  Result Date: 07/12/2016 CLINICAL DATA:  Patient reports she has had a wound that has been getting worse X 1 month, patient denies any chest complaints at this time HX stroke, CHF EXAM: RIGHT ANKLE - COMPLETE 3+ VIEW COMPARISON:  12/18/2014 FINDINGS: No  fracture. There is a chronic area of bony prominence and sclerosis along the anterolateral aspect of the distal tibia. There is no bone resorption to suggest osteomyelitis. Bones are demineralized. The ankle mortise is normally spaced and aligned. There is diffuse soft tissue swelling.  No soft tissue air. IMPRESSION: 1. No fracture or dislocation. 2. No evidence of osteomyelitis. Electronically Signed   By: Lajean Manes M.D.   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 Cardiac silhouette is mildly enlarged. No mediastinal hilar masses. Prominent bronchovascular markings and interstitial thickening has  mildly improved. There is hazy opacity at the right lung base consistent with atelectasis possibly with a small effusion, stable. Mild thickening of the minor fissure appears less prominent than the previous day's study. No lung consolidation is seen to suggest pneumonia. There is no pneumothorax. IMPRESSION: 1. Mild improvement in lung aeration from the previous day's exam consistent with improved congestive heart failure/interstitial edema. No new abnormalities. Electronically Signed   By: Lajean Manes M.D.   On: 07/13/2016 07:40    Time Spent in minutes  25   Louellen Molder M.D on 07/15/2016 at 10:09 AM  Between 7am to 7pm - Pager - [phone]  After 7pm go to [email or web address] - password [facility]  [facility] -  Office  [phone]

## 2016-07-15 NOTE — Progress Notes (Signed)
Inpatient Diabetes Program Recommendations  AACE/ADA: New Consensus Statement on Inpatient Glycemic Control (2015)  Target Ranges:  Prepandial:   less than 140 mg/dL      Peak postprandial:   less than 180 mg/dL (1-2 hours)      Critically ill patients:  140 - 180 mg/dL   Results for HAILYN, ZARR (MRN [id number]) as of 07/15/2016 08:32  Ref. Range 07/14/2016 07:46 07/14/2016 11:49 07/14/2016 14:44 07/14/2016 16:02 07/14/2016 22:39 07/15/2016 08:01  Glucose-Capillary Latest Ref Range: 65 - 99 mg/dL 189 (H) 403 (H) 395 (H) 311 (H) 118 (H) 160 (H)   Review of Glycemic Control  Outpatient Diabetes medications: Lantus 11 units QHS, Novolog 11 units TID with meals Current orders for Inpatient glycemic control: Lantus 18 units daily, Novolog 0-15 units TID with meals, Novolog 0-5 units QHS, Novolog 3 units TID with meals  Inpatient Diabetes Program Recommendations:  Insulin - Basal: Patient received Lantus 11 units on 07/14/16 and fasting glucose 160 mg/dl this morning. Noted Lantus increased from 11 to 18 units daily today. Since fasting is trending fairly well, anticipate patient needs more Novolog meal coverage with meals since post prandial glucose is consistently elevated. Recommend decreasing Lantus to 12 units daily. Insulin - Meal Coverage: Please consider increasing meal coverage to Novolog 10 units TID with meals.  Thanks, Barnie Alderman, RN, MSN, CDE Diabetes Coordinator Inpatient Diabetes Program [phone] (Team Pager from 8am to 5pm)

## 2016-07-15 NOTE — Progress Notes (Signed)
Pain assessment completed. Ask pt if she wanted PRN tramadol, pt wrote in her note book "I will wait". Will continue to monitor  [name] [name]

## 2016-07-15 NOTE — Progress Notes (Signed)
Pt family at bedside. Daughter Gatha Mayer. Daughter states she wants pt to be a full code. Daughter does not have copy of power of atterny, living will or other documents. MD paged  Armenta Erskin

## 2016-07-15 NOTE — Progress Notes (Signed)
Stopped MD in hallway to inform of pt low blood pressure and have not given IV lasix at this time. Also informed MD that pt has been crying in pain, pt has received tylenol and no relief. MD stated he would look over the chart   Jerzie Bieri Leory Plowman

## 2016-07-15 NOTE — Care Management Note (Signed)
Case Management Note  Patient Details  Name: [name] MRN: [id number] Date of Birth: [birth year]  Subjective/Objective:                 Spoke with patient at the bedside (she communicated through writing on paper). Patient states she lives with her grandson. Would like CM to set up [facility] through her daughter when needed. She states she has a HHA 2 hours a day. CM will continue to follow    Action/Plan:   Expected Discharge Date:                  Expected Discharge Plan:  [place]  In-House Referral:     Discharge planning Services  CM Consult  Post Acute Care Choice:    Choice offered to:  Patient  DME Arranged:    DME Agency:     HH Arranged:    [place] Agency:     Status of Service:  In process, will continue to follow  If discussed at Long Length of Stay Meetings, dates discussed:    Additional Comments:  Carles Collet, RN 07/15/2016, 2:05 PM

## 2016-07-15 NOTE — Progress Notes (Signed)
Pt educated on hypoglycemia signs and symptoms. Pt understands. Will continue to monitor   [name] [name]

## 2016-07-16 DIAGNOSIS — I10 Essential (primary) hypertension: Secondary | ICD-10-CM

## 2016-07-16 DIAGNOSIS — E11649 Type 2 diabetes mellitus with hypoglycemia without coma: Secondary | ICD-10-CM

## 2016-07-16 DIAGNOSIS — I5021 Acute systolic (congestive) heart failure: Secondary | ICD-10-CM

## 2016-07-16 DIAGNOSIS — R531 Weakness: Secondary | ICD-10-CM

## 2016-07-16 DIAGNOSIS — I5033 Acute on chronic diastolic (congestive) heart failure: Secondary | ICD-10-CM

## 2016-07-16 DIAGNOSIS — I5042 Chronic combined systolic (congestive) and diastolic (congestive) heart failure: Secondary | ICD-10-CM

## 2016-07-16 DIAGNOSIS — E440 Moderate protein-calorie malnutrition: Secondary | ICD-10-CM

## 2016-07-16 DIAGNOSIS — Z794 Long term (current) use of insulin: Secondary | ICD-10-CM

## 2016-07-16 DIAGNOSIS — L97311 Non-pressure chronic ulcer of right ankle limited to breakdown of skin: Secondary | ICD-10-CM

## 2016-07-16 DIAGNOSIS — L97511 Non-pressure chronic ulcer of other part of right foot limited to breakdown of skin: Secondary | ICD-10-CM

## 2016-07-16 LAB — BASIC METABOLIC PANEL
Anion gap: 8 (ref 5–15)
BUN: 24 mg/dL — ABNORMAL HIGH (ref 6–20)
CALCIUM: 9 mg/dL (ref 8.9–10.3)
CO2: 29 mmol/L (ref 22–32)
Chloride: 101 mmol/L (ref 101–111)
Creatinine, Ser: 0.98 mg/dL (ref 0.44–1.00)
GFR, EST NON AFRICAN AMERICAN: 55 mL/min — AB (ref >60)
Glucose, Bld: 179 mg/dL — ABNORMAL HIGH (ref 65–99)
Potassium: 3.7 mmol/L (ref 3.5–5.1)
SODIUM: 138 mmol/L (ref 135–145)

## 2016-07-16 LAB — GLUCOSE, CAPILLARY
GLUCOSE-CAPILLARY: 213 mg/dL — AB (ref 65–99)
GLUCOSE-CAPILLARY: 61 mg/dL — AB (ref 65–99)
GLUCOSE-CAPILLARY: 80 mg/dL (ref 65–99)
GLUCOSE-CAPILLARY: 161 mg/dL — AB (ref 65–99)
Glucose-Capillary: 234 mg/dL — ABNORMAL HIGH (ref 65–99)

## 2016-07-16 MED ORDER — POLYETHYLENE GLYCOL 3350 17 G PO PACK
17.0000 g | PACK | Freq: Every day | ORAL | Status: DC
  Administered 2016-07-17: 17 g via ORAL
  Filled 2016-07-16: qty 1

## 2016-07-16 MED ORDER — CALCIUM CARBONATE ANTACID 500 MG PO CHEW
2.0000 | CHEWABLE_TABLET | Freq: Three times a day (TID) | ORAL | Status: DC
  Administered 2016-07-16 – 2016-07-17 (×3): 400 mg via ORAL
  Filled 2016-07-16 (×4): qty 2

## 2016-07-16 MED ORDER — INSULIN GLARGINE 100 UNIT/ML ~~LOC~~ SOLN
14.0000 [IU] | Freq: Every day | SUBCUTANEOUS | Status: DC
  Administered 2016-07-17: 14 [IU] via SUBCUTANEOUS
  Filled 2016-07-16: qty 0.14

## 2016-07-16 NOTE — Progress Notes (Signed)
SLP Cancellation Note  Patient Details Name: [name] [name] MRN: [id number] DOB: 1940/10/30   Cancelled treatment:       Reason Eval/Treat Not Completed: Other (comment) (pt declined POs due to nausea, will follow up for skilled ST at next available appointment)   Deandrea Vanpelt, Selinda Orion 07/16/2016, 9:12 AM

## 2016-07-16 NOTE — Progress Notes (Signed)
PROGRESS NOTE                                                                                                                                                                                                             Patient Demographics:    [name] [name], is a 76 y.o. female, DOB - Oct 18, 1940, ZCH:[id number]  Admit date - 07/12/2016   Admitting Physician Lady Deutscher, MD  Outpatient Primary MD for the patient is [name], MD  LOS - 4  Outpatient Specialists:  Chief Complaint  Patient presents with  . Leg Swelling  . Cough       Brief Narrative   76 year old female who is nonverbal following a stroke, hypertension, hyperlipidemia, diastolic CHF and diabetes mellitus brought to the ED by EMS with worsening bilateral lower extremity swelling. Also noted draining right lateral malleolus wound for past 2-3 days. Patient is nonverbal so history is limited. She complained of ongoing cough for almost 2 months. No change in her medications and reportedly adherent to them. Patient found to be in acute diastolic CHF. BNP was elevated at 810 and chest x-ray concerning for fluid overload. She received 80 mg IV Lasix in the ED and admitted to telemetry.   Subjective:   Nonverbal, when she asked about pain she points to her right lower extremity.   Assessment  & Plan :      Acute on chronic Diastolic CHF ([place]) Echo from 11/2015 with normal EF, basal inferior wall hypokinesis and grade 1 diastolic resumption. Diuresing well on IV Lasix 40 mg twice a day. Symptoms better. Continue strict I/O and daily weight.(Urine output not well monitored). -Resumed metoprolol (taken off beta blocker for reasons unclear). Continue  lisinopril and statin. -Leg edema seems to be getting better. (Has lost about 5 pounds since admission). -Continue diuresis.    Ankle ulcer, right, limited to breakdown of skin ([place]) Complains of pain and  some purulent discharge. Seen by wound care and recommend twice a day saline dressing and elevation to support a epithelization. -X-ray of the right ankle on admission negative for fracture or osteomyelitis. -Added empiric doxycycline. -Keep legs elevated, compression stocking.  Diabetes mellitus with hyperglycemia  Uncontrolled with CBG in 400s. A1c of 7.5.  NovoLog 10 units with meals, increase Lantus to 14 units. Discontinue nighttime short-acting insulin.  Essential hypertension  Blood pressure stable. Continue home medications.     CAD (coronary artery disease), native coronary artery Continue Plavix, statin. Added beta blocker.    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 Social work consulted. She is on regular diet, consult to evaluate.  Anemia of chronic disease Hemoglobin stable at baseline     Code Status :Full code  Family Communication  : None at bedside  Disposition Plan  : PT recommended SNF. Family wants to take her home, likely home in a.m.  Barriers For Discharge :  Still has significant lower extremity edema.  Consults  :   Wound care  Procedures  : None  DVT Prophylaxis  :  Lovenox -  Lab Results  Component Value Date   PLT 187 07/12/2016    Antibiotics  :   Anti-infectives    Start     Dose/Rate Route Frequency Ordered Stop   07/14/16 1200  doxycycline (VIBRA-TABS) tablet 100 mg     100 mg Oral Every 12 hours 07/14/16 1156          Objective:   Vitals:   07/15/16 0915 07/15/16 1120 07/15/16 1934 07/16/16 0418  BP: (!) 105/55 (!) 103/53 (!) 121/53 (!) 110/55  Pulse: 69 71 72 65  Resp:  18 18 18   Temp:  98.1 F (36.7 C) 98.2 F (36.8 C) 97.5 F (36.4 C)  TempSrc:  Oral Oral Oral  SpO2:  94% 100% 99%  Weight:    70.5 kg (155 lb 6.4 oz)  Height:        Wt Readings from Last 3 Encounters:  07/16/16 70.5 kg (155 lb 6.4 oz)    12/04/15 83.1 kg (183 lb 1.6 oz)  01/28/14 78.8 kg (173 lb 11.2 oz)     Intake/Output Summary (Last 24 hours) at 07/16/16 1132 Last data filed at 07/16/16 0600  Gross per 24 hour  Intake              400 ml  Output              750 ml  Net             -350 ml     Physical Exam Elderly female nonverbal, still complaining of pain in her right ankle HEENT: Moist mucosa, supple neck Chest: Fine bibasilar crackles   CVS: Normal S1 and S2, no murmurs GI: Soft, nondistended, nontender Musculoskeletal: Warm, trace pitting edema bilaterally, 0.5 cm ulcer over right lateral malleolus no bleeding or discharge, tender    Data Review:    CBC  Recent Labs Lab 07/12/16 1206  WBC 3.9*  HGB 10.1*  HCT 33.7*  PLT 187  MCV 88.0  MCH 26.4  MCHC 30.0  RDW 16.3*  LYMPHSABS 1.1  MONOABS 0.4  EOSABS 0.1  BASOSABS 0.0    Chemistries   Recent Labs Lab 07/12/16 1206 07/13/16 0340 07/14/16 0348 07/14/16 1235 07/15/16 0525 07/16/16 0405  NA 139 139 137 136 138 138  K 4.1 3.7 3.4* 3.9 3.8 3.7  CL 107 101 101 99* 101 101  CO2 26 28 28 28 27 29   GLUCOSE 053* 976* 181* 399* 163* 179*  BUN 9 12 17 17 20  24*  CREATININE 0.74 0.99 0.91 0.87 0.97 0.98  CALCIUM 9.1 9.2 8.8* 9.4 9.2 9.0  AST 21  --   --   --   --   --  ALT 17  --   --   --   --   --   ALKPHOS 68  --   --   --   --   --   BILITOT 0.7  --   --   --   --   --    ------------------------------------------------------------------------------------------------------------------ No results for input(s): CHOL, HDL, LDLCALC, TRIG, CHOLHDL, LDLDIRECT in the last 72 hours.  Lab Results  Component Value Date   HGBA1C 7.5 (H) 07/14/2016   ------------------------------------------------------------------------------------------------------------------ No results for input(s): TSH, T4TOTAL, T3FREE, THYROIDAB in the last 72 hours.  Invalid input(s):  FREET3 ------------------------------------------------------------------------------------------------------------------ No results for input(s): VITAMINB12, FOLATE, FERRITIN, TIBC, IRON, RETICCTPCT in the last 72 hours.  Coagulation profile No results for input(s): INR, PROTIME in the last 168 hours.  No results for input(s): DDIMER in the last 72 hours.  Cardiac Enzymes No results for input(s): CKMB, TROPONINI, MYOGLOBIN in the last 168 hours.  Invalid input(s): CK ------------------------------------------------------------------------------------------------------------------    Component Value Date/Time   BNP 974.1 (H) 07/13/2016 0340    Inpatient Medications  Scheduled Meds: . anastrozole  1 mg Oral Daily  . cholecalciferol  1,000 Units Oral Daily  . clopidogrel  75 mg Oral Daily  . doxycycline  100 mg Oral Q12H  . enoxaparin (LOVENOX) injection  40 mg Subcutaneous Q24H  . escitalopram  20 mg Oral Daily  . feeding supplement (ENSURE ENLIVE)  237 mL Oral BID BM  . furosemide  40 mg Intravenous BID  . gabapentin  100 mg Oral TID  . insulin aspart  0-15 Units Subcutaneous TID WC  . insulin aspart  0-5 Units Subcutaneous QHS  . insulin aspart  10 Units Subcutaneous TID WC  . insulin glargine  12 Units Subcutaneous Daily  . lisinopril  5 mg Oral Daily  . metoprolol tartrate  12.5 mg Oral BID  . potassium chloride SA  40 mEq Oral Daily  . rosuvastatin  40 mg Oral Daily  . sodium chloride flush  3 mL Intravenous Q12H   Continuous Infusions: . sodium chloride     PRN Meds:.sodium chloride, acetaminophen, nitroGLYCERIN, ondansetron (ZOFRAN) IV, sodium chloride flush, traMADol-acetaminophen, zolpidem  Micro Results No results found for this or any previous visit (from the past 240 hour(s)).  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 Cardiac silhouette is mildly enlarged. No mediastinal or hilar masses. No evidence of adenopathy. There are prominent bronchovascular markings bilaterally similar to the prior study. There is opacity at the right lung base partly silhouetting the hemidiaphragm consistent with small pleural effusion. There is thickening along the fissures. No evidence of pneumonia.  No pneumothorax. Skeletal structures are demineralized. There is a mild wedge-shaped deformity at the thoracolumbar junction stable from a lateral chest radiograph dated 08/03/2015. IMPRESSION: 1. Mild cardiomegaly, prominent bronchovascular markings, thickening of the fissures and small right pleural effusion. Findings are similar to prior studies, suggesting a mild degree of chronic congestive heart failure. No evidence of pneumonia. Electronically Signed   By: Lajean Manes M.D.   On: 07/12/2016 12:33   Dg Ankle Complete Right  Result Date: 07/12/2016 CLINICAL DATA:  Patient reports she has had a wound that has been getting worse X 1 month, patient denies  any chest complaints at this time HX stroke, CHF EXAM: RIGHT ANKLE - COMPLETE 3+ VIEW COMPARISON:  12/18/2014 FINDINGS: No fracture. There is a chronic area of bony prominence and sclerosis along the anterolateral aspect of the distal tibia. There is no bone resorption to suggest osteomyelitis. Bones are demineralized. The ankle mortise is normally spaced and aligned. There is diffuse soft tissue swelling.  No soft tissue air. IMPRESSION: 1. No fracture or dislocation. 2. No evidence of osteomyelitis. Electronically Signed   By: Lajean Manes M.D.   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  Cardiac silhouette is mildly enlarged. No mediastinal hilar masses. Prominent bronchovascular markings and interstitial thickening has mildly improved. There is hazy opacity at the right lung base consistent with atelectasis possibly with a small effusion, stable. Mild thickening of the minor fissure appears less prominent than the previous day's study. No lung consolidation is seen to suggest pneumonia. There is no pneumothorax. IMPRESSION: 1. Mild improvement in lung aeration from the previous day's exam consistent with improved congestive heart failure/interstitial edema. No new abnormalities. Electronically Signed   By: Lajean Manes M.D.   On: 07/13/2016 07:40    Time Spent in minutes  25   Keyden Pavlov A M.D on 07/16/2016 at 11:32 AM  Between 7am to 7pm - Pager - [phone]  After 7pm go to [email or web address] - password [facility]  [facility] -  Office  [phone]

## 2016-07-16 NOTE — Clinical Social Work Note (Signed)
MD called CSW, stating that he had spoke with patient's daughter, [name], regarding SNF. She is interested in patient going to a facility and would talk to her mother about it. CSW gave contact card to nurse secretary to give to patient's daughter. Received voicemail from [name]. CSW spoke with patient before calling [name] back. Patient wrote down that she still wants to return home with HHPT. She gave CSW permission to call CSW back. CSW discussed the difference in therapy frequency with SNF vs. HHPT. [name] will be by the hospital after work and will discuss SNF with patient. First preference is [facility]. [name] will leave CSW a voicemail tonight with decision for CSW to receive in the morning.  Dayton Scrape, [place]

## 2016-07-16 NOTE — Progress Notes (Signed)
Inpatient Diabetes Program Recommendations  AACE/ADA: New Consensus Statement on Inpatient Glycemic Control (2015)  Target Ranges:  Prepandial:   less than 140 mg/dL      Peak postprandial:   less than 180 mg/dL (1-2 hours)      Critically ill patients:  140 - 180 mg/dL  Results for [name] [name], [name] [name] (MRN [id number]) as of 07/16/2016 09:03  Ref. Range 07/15/2016 08:01 07/15/2016 11:14 07/15/2016 15:45 07/15/2016 22:06 07/16/2016 07:26  Glucose-Capillary Latest Ref Range: 65 - 99 mg/dL 160 (H) 374 (H) 126 (H) 335 (H) 213 (H)    Review of Glycemic Control  Current orders for Inpatient glycemic control: Lantus 12 units daily, Novolog 0-15 units TID with meals, Novolog 0-5 units QHS, Novolog 10 units TID with meals for meal coverage  Inpatient Diabetes Program Recommendations: Insulin - Basal: Please consider increasing Lantus to 13 units daily. Insulin - Meal Coverage: No meal coverage given with breakfast or supper on 07/15/16. As a result glucose up to 374 mg/dl at 11:14 am and 335 mg/dl at bedtime. NURSING: Please administer Novolog meal coverage when parameters are met.  Thanks, Barnie Alderman, RN, MSN, CDE Diabetes Coordinator Inpatient Diabetes Program [phone] (Team Pager from 8am to 5pm)

## 2016-07-17 DIAGNOSIS — I69359 Hemiplegia and hemiparesis following cerebral infarction affecting unspecified side: Secondary | ICD-10-CM

## 2016-07-17 DIAGNOSIS — I69328 Other speech and language deficits following cerebral infarction: Secondary | ICD-10-CM

## 2016-07-17 LAB — BASIC METABOLIC PANEL
Anion gap: 6 (ref 5–15)
BUN: 26 mg/dL — ABNORMAL HIGH (ref 6–20)
CO2: 29 mmol/L (ref 22–32)
CREATININE: 1.11 mg/dL — AB (ref 0.44–1.00)
Calcium: 9 mg/dL (ref 8.9–10.3)
Chloride: 100 mmol/L — ABNORMAL LOW (ref 101–111)
GFR calc non Af Amer: 47 mL/min — ABNORMAL LOW (ref >60)
GFR, EST AFRICAN AMERICAN: 55 mL/min — AB (ref >60)
Glucose, Bld: 182 mg/dL — ABNORMAL HIGH (ref 65–99)
POTASSIUM: 4.1 mmol/L (ref 3.5–5.1)
Sodium: 135 mmol/L (ref 135–145)

## 2016-07-17 LAB — GLUCOSE, CAPILLARY
GLUCOSE-CAPILLARY: 191 mg/dL — AB (ref 65–99)
GLUCOSE-CAPILLARY: 52 mg/dL — AB (ref 65–99)
Glucose-Capillary: 229 mg/dL — ABNORMAL HIGH (ref 65–99)
Glucose-Capillary: 87 mg/dL (ref 65–99)

## 2016-07-17 MED ORDER — INSULIN GLARGINE 100 UNIT/ML ~~LOC~~ SOLN: 20.0000 [IU] | Freq: Every day | SUBCUTANEOUS | 11 refills | Status: DC

## 2016-07-17 MED ORDER — INSULIN ASPART 100 UNIT/ML ~~LOC~~ SOLN
10.0000 [IU] | Freq: Three times a day (TID) | SUBCUTANEOUS | Status: DC
Start: 2016-07-17 — End: 2017-01-06

## 2016-07-17 MED ORDER — FUROSEMIDE 40 MG PO TABS: 40.0000 mg | ORAL_TABLET | Freq: Two times a day (BID) | ORAL | 0 refills | Status: DC

## 2016-07-17 NOTE — Discharge Summary (Signed)
Physician Discharge Summary  [name] [name] HAL:[id number] DOB: [birth year] DOA: 07/12/2016  PCP: [name], MD  Admit date: 07/12/2016 Discharge date: 07/17/2016  Admitted From: Home Disposition: Home  Recommendations for Outpatient Follow-up:  1. Follow up with PCP in 1-2 weeks 2. Please obtain BMP/CBC in one week 3. Lasix dose increased to 40 mg twice a day, follow renal function closely. Compression stockings 4. Lantus increased to 20 units on discharge and NovoLog to 10 units with meals.  Home Health: PT/OT Equipment/Devices:NA  Discharge Condition: Stable CODE STATUS: Full Code Diet recommendation: Diet Heart Room service appropriate? Yes; Fluid consistency: Thin; Fluid restriction: 1200 mL Fluid Diet - low sodium heart healthy  Brief/Interim Summary: 76 year old female who is nonverbal following a stroke, hypertension, hyperlipidemia, diastolic CHF and diabetes mellitus brought to the ED by EMS with worsening bilateral lower extremity swelling. Also noted draining right lateral malleolus wound for past 2-3 days. Patient is nonverbal so history is limited. She complained of ongoing cough for almost 2 months. No change in her medications and reportedly adherent to them. Patient found to be in acute diastolic CHF. BNP was elevated at 810 and chest x-ray concerning for fluid overload. She received 80 mg IV Lasix in the ED and admitted to telemetry.  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   Diuresed with IV Lasix 40 mg twice a day, her weight and intake/output monitored. -Resumed metoprolol (taken off beta blocker for reasons unclear). Continue  lisinopril and statin. -Leg edema improved. Recommended compression stockings and leg elevation. -Discharged on Lasix 40 mg twice a day, continued home dose of lisinopril and metoprolol.  Ankle ulcer, right, limited to breakdown of skin ([place]) Complains of pain and some purulent discharge. Seen by wound care and recommend twice a day saline dressing and elevation to support a epithelization. -X-ray of the right ankle on admission negative for fracture or osteomyelitis. -Treated with empiric doxycycline while she was in the hospital, no fever or chills no antibiotic on discharge. -Recommended to elevate the legs and use compression stockings.  Diabetes mellitus with hyperglycemia  Uncontrolled with CBG in 400s. A1c of 7.5.  Lantus increased to 20 units on discharge and NovoLog to 10 units with meals. Follow-up as outpatient.  Essential hypertension Blood pressure stable. Continue home medications.  CAD (coronary artery disease), native coronary artery Continue Plavix, statin and beta blocker.    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 Discharge home with home health service. She is on regular diet.  Anemia of chronic disease Hemoglobin stable at baseline   Discharge Instructions  Discharge Instructions    Amb Referral to HF Clinic    Complete by:  As directed    Diet - low sodium heart healthy    Complete by:  As directed    Increase  activity slowly    Complete by:  As directed      Allergies as of 07/17/2016      Reactions   Penicillins Swelling   Swelling of mouth Has patient had a PCN reaction  causing immediate rash, facial/tongue/throat swelling, SOB or lightheadedness with hypotension: YES Has patient had a PCN reaction causing severe rash involving mucus membranes or skin necrosis: NO Has patient had a PCN reaction that required hospitalization NO Has patient had a PCN reaction occurring within the last 10 years: NO If all of the above answers are "NO", then may proceed with Cephalosporin use.      Medication List    TAKE these medications   anastrozole 1 MG tablet Commonly known as:  ARIMIDEX Take 1 tablet (1 mg total) by mouth daily.   cholecalciferol 1000 units tablet Commonly known as:  VITAMIN D Take 1,000 Units by mouth daily.   clopidogrel 75 MG tablet Commonly known as:  PLAVIX Take 75 mg by mouth daily.   escitalopram 20 MG tablet Commonly known as:  LEXAPRO Take 20 mg by mouth daily.   furosemide 40 MG tablet Commonly known as:  LASIX Take 1 tablet (40 mg total) by mouth 2 (two) times daily. What changed:  when to take this   gabapentin 100 MG capsule Commonly known as:  NEURONTIN Take 1 capsule (100 mg total) by mouth 3 (three) times daily.   insulin aspart 100 UNIT/ML injection Commonly known as:  novoLOG Inject 10 Units into the skin 3 (three) times daily with meals. What changed:  how much to take   insulin glargine 100 UNIT/ML injection Commonly known as:  LANTUS Inject 0.2 mLs (20 Units total) into the skin daily. What changed:  how much to take   lidocaine 5 % ointment Commonly known as:  XYLOCAINE Apply 1 application topically 2 (two) times daily as needed.   lisinopril 5 MG tablet Commonly known as:  PRINIVIL,ZESTRIL Take 1 tablet (5 mg total) by mouth daily.   metoprolol tartrate 25 MG tablet Commonly known as:  LOPRESSOR Take 0.5 tablets (12.5 mg total) by mouth 2 (two) times daily.   nitroGLYCERIN 0.4 MG SL tablet Commonly known as:  NITROSTAT Place 0.4 mg under the tongue every 5 (five) minutes as needed for chest pain.    potassium chloride SA 20 MEQ tablet Commonly known as:  K-DUR,KLOR-CON Take 1 tablet (20 mEq total) by mouth daily.   rosuvastatin 40 MG tablet Commonly known as:  CRESTOR Take 40 mg by mouth daily.       Allergies  Allergen Reactions  . Penicillins Swelling    Swelling of mouth Has patient had a PCN reaction causing immediate rash, facial/tongue/throat swelling, SOB or lightheadedness with hypotension: YES Has patient had a PCN reaction causing severe rash involving mucus membranes or skin necrosis: NO Has patient had a PCN reaction that required hospitalization NO Has patient had a PCN reaction occurring within the last 10 years: NO If all of the above answers are "NO", then may proceed with Cephalosporin use.    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 Cardiac silhouette is mildly enlarged. No  mediastinal or hilar masses. No evidence of adenopathy. There are prominent bronchovascular markings bilaterally similar to the prior study. There is opacity at the right lung base partly silhouetting the hemidiaphragm consistent with small pleural effusion. There is thickening along the fissures. No evidence of pneumonia.  No pneumothorax. Skeletal structures are demineralized. There is a mild wedge-shaped deformity at the thoracolumbar junction stable from a lateral chest radiograph dated 08/03/2015. IMPRESSION: 1. Mild cardiomegaly, prominent bronchovascular markings, thickening of the fissures and small right pleural effusion. Findings are similar to prior studies, suggesting a mild degree of chronic congestive heart failure. No evidence of  pneumonia. Electronically Signed   By: Lajean Manes M.D.   On: 07/12/2016 12:33   Dg Ankle Complete Right  Result Date: 07/12/2016 CLINICAL DATA:  Patient reports she has had a wound that has been getting worse X 1 month, patient denies any chest complaints at this time HX stroke, CHF EXAM: RIGHT ANKLE - COMPLETE 3+ VIEW COMPARISON:  12/18/2014 FINDINGS: No fracture. There is a chronic area of bony prominence and sclerosis along the anterolateral aspect of the distal tibia. There is no bone resorption to suggest osteomyelitis. Bones are demineralized. The ankle mortise is normally spaced and aligned. There is diffuse soft tissue swelling.  No soft tissue air. IMPRESSION: 1. No fracture or dislocation. 2. No evidence of osteomyelitis. Electronically Signed   By: Lajean Manes M.D.   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 Cardiac silhouette is mildly enlarged. No mediastinal hilar masses. Prominent bronchovascular markings and interstitial thickening has mildly improved. There is hazy opacity at the right lung base consistent with atelectasis possibly with a small effusion, stable. Mild thickening of the minor fissure appears less prominent than the previous day's study. No lung consolidation is seen to suggest pneumonia. There is no pneumothorax. IMPRESSION: 1. Mild improvement in lung aeration from the previous day's exam consistent with improved congestive heart failure/interstitial edema. No new abnormalities. Electronically Signed   By: Lajean Manes M.D.   On: 07/13/2016 07:40    Subjective:  Discharge Exam: Vitals:   07/16/16 0418 07/16/16 1659 07/16/16 2150 07/17/16 0620  BP: (!) 110/55 (!) 96/59 115/79 (!) 103/53  Pulse: 65  78 78  Resp: 18 20 18 16   Temp: 97.5 F (36.4 C)  98.1 F (36.7 C) 99.2 F (37.3 C)   TempSrc: Oral  Oral Oral  SpO2: 99% 100% 97% 98%  Weight: 70.5 kg (155 lb 6.4 oz)   70.4 kg (155 lb 4.8 oz)  Height:       General: Pt is alert, awake, not in acute distress Cardiovascular: RRR, S1/S2 +, no rubs, no gallops Respiratory: CTA bilaterally, no wheezing, no rhonchi Abdominal: Soft, NT, ND, bowel sounds + Extremities: no edema, no cyanosis   The results of significant diagnostics from this hospitalization (including imaging, microbiology, ancillary and laboratory) are listed below for reference.    Microbiology: No results found for this or any previous visit (from the past 240 hour(s)).   Labs: BNP (last 3 results)  Recent Labs  12/01/15 2113 07/12/16 1206 07/13/16 0340  BNP 359.5* 810.5* 197.5*   Basic Metabolic Panel:  Recent Labs Lab 07/14/16 0348 07/14/16 1235 07/15/16 0525 07/16/16 0405 07/17/16 0358  NA 137 136 138 138 135  K 3.4* 3.9 3.8 3.7 4.1  CL 101  99* 101 101 100*  CO2 28 28 27 29 29   GLUCOSE 181* 399* 163* 179* 182*  BUN 17 17 20  24* 26*  CREATININE 0.91 0.87 0.97 0.98 1.11*  CALCIUM 8.8* 9.4 9.2 9.0 9.0   Liver Function Tests:  Recent Labs Lab 07/12/16 1206  AST 21  ALT 17  ALKPHOS 68  BILITOT 0.7  PROT 6.9  ALBUMIN 3.5   No results for input(s): LIPASE, AMYLASE in the last 168 hours. No results for input(s): AMMONIA in the last 168 hours. CBC:  Recent Labs Lab 07/12/16 1206  WBC 3.9*  NEUTROABS 2.3  HGB 10.1*  HCT 33.7*  MCV 88.0  PLT 187   Cardiac Enzymes: No results for input(s): CKTOTAL, CKMB, CKMBINDEX, TROPONINI in the last 168 hours. BNP: Invalid input(s): POCBNP CBG:  Recent Labs Lab 07/16/16 1125 07/16/16 1656 07/16/16 1803 07/16/16 2135 07/17/16 0738  GLUCAP 234* 61* 80 161* 191*   D-Dimer No results for input(s): DDIMER in the last 72 hours. Hgb A1c No results for input(s): HGBA1C in the last 72 hours. Lipid Profile No results for input(s): CHOL, HDL, LDLCALC, TRIG, CHOLHDL, LDLDIRECT in  the last 72 hours. Thyroid function studies No results for input(s): TSH, T4TOTAL, T3FREE, THYROIDAB in the last 72 hours.  Invalid input(s): FREET3 Anemia work up No results for input(s): VITAMINB12, FOLATE, FERRITIN, TIBC, IRON, RETICCTPCT in the last 72 hours. Urinalysis    Component Value Date/Time   COLORURINE YELLOW 06/07/2016 2025   APPEARANCEUR CLEAR 06/07/2016 2025   LABSPEC 1.023 06/07/2016 2025   PHURINE 5.0 06/07/2016 2025   GLUCOSEU 150 (A) 06/07/2016 2025   HGBUR NEGATIVE 06/07/2016 2025   BILIRUBINUR NEGATIVE 06/07/2016 2025   KETONESUR 5 (A) 06/07/2016 2025   PROTEINUR NEGATIVE 06/07/2016 2025   UROBILINOGEN 1.0 07/13/2013 2255   NITRITE NEGATIVE 06/07/2016 2025   LEUKOCYTESUR NEGATIVE 06/07/2016 2025   Sepsis Labs Invalid input(s): PROCALCITONIN,  WBC,  LACTICIDVEN Microbiology No results found for this or any previous visit (from the past 240 hour(s)).   Time coordinating discharge: Over 30 minutes  SIGNED:   Birdie Hopes, MD  [facility] 07/17/2016, 10:22 AM Pager   If 7PM-7AM, please contact night-coverage [email or web address] Password [email or web address]

## 2016-07-17 NOTE — Care Management Note (Signed)
Case Management Note Original Note Created by Carles Collet 07/15/16  Patient Details  Name: [name] [name] MRN: [id number] Date of Birth: 05/25/1940  Subjective/Objective:                 Spoke with patient at the bedside (she communicated through writing on paper). Patient states she lives with her grandson. Would like CM to set up [facility] through her daughter when needed. She states she has a HHA 2 hours a day. CM will continue to follow    Action/Plan:   Expected Discharge Date:  07/17/16               Expected Discharge Plan:  [place]  In-House Referral:     Discharge planning Services  CM Consult  Post Acute Care Choice:    Choice offered to:  Patient  DME Arranged:    DME Agency:     HH Arranged:  RN, PT, OT, Refused SNF, Nurse's Aide [place] Agency:  [place]  Status of Service:  Completed, signed off  If discussed at [place] of Stay Meetings, dates discussed:    Additional Comments: Pt continues to refuse SNF even though HH will not be able to provide 24 hour supervision - daughter is aware and will take pt home via private vehicle today  - attending aware and has deemed pt safe to discharge home.  Pt lives with 2 grandsons - one works and the other one is mostly in the home 24 hours, daughter will provide support when she can (daughter works).  [place] agency choice given - daughter [name] chose [facility] - agency contacted and referral accepted.  Pt also has nurse tech that already comes into the home provided by DSS for approximately 2 hours a day. Per daughter - pt already has all needed equipment in the home including hospital bed, lift and wheelchair Maryclare Labrador, RN 07/17/2016, 10:38 AM

## 2016-07-17 NOTE — Progress Notes (Signed)
PT Cancellation Note  Patient Details Name: [name] [name] MRN: [id number] DOB: Jul 10, 1940   Cancelled Treatment:    Reason Eval/Treat Not Completed: Patient declined, no reason specified. Pt is still eating her breakfast. Checked in x2 in the past hour. Will check back later this afternoon as time allows.    Scheryl Marten PT, DPT  [phone]  07/17/2016, 9:09 AM

## 2016-07-17 NOTE — Progress Notes (Signed)
Patient discharged to home with all her belongings.  Daughter here to pick up.  Escorted to the front entrance by staff via wheelchair.  No concerns voiced  at the time of discharge. Marcille Blanco, RN

## 2016-07-17 NOTE — Clinical Social Work Note (Signed)
Patient's daughter left CSW a voicemail last night after speaking with the patient about SNF. She stated that the patient continues to want to go home with HHPT. Will notify MD and RNCM in progression meeting this morning.  CSW signing off.  Dayton Scrape, [place]

## 2016-07-22 ENCOUNTER — Ambulatory Visit (HOSPITAL_BASED_OUTPATIENT_CLINIC_OR_DEPARTMENT_OTHER): Payer: Medicare HMO | Admitting: Oncology

## 2016-07-22 VITALS — BP 130/59 | HR 63 | Temp 97.9°F | Resp 17 | Ht 62.0 in | Wt 158.0 lb

## 2016-07-22 DIAGNOSIS — Z79811 Long term (current) use of aromatase inhibitors: Secondary | ICD-10-CM | POA: Diagnosis not present

## 2016-07-22 DIAGNOSIS — Z170 Estrogen receptor positive status [ER+]: Secondary | ICD-10-CM | POA: Diagnosis not present

## 2016-07-22 DIAGNOSIS — E119 Type 2 diabetes mellitus without complications: Secondary | ICD-10-CM

## 2016-07-22 DIAGNOSIS — I5023 Acute on chronic systolic (congestive) heart failure: Secondary | ICD-10-CM

## 2016-07-22 DIAGNOSIS — C50511 Malignant neoplasm of lower-outer quadrant of right female breast: Secondary | ICD-10-CM | POA: Diagnosis not present

## 2016-07-22 NOTE — Progress Notes (Addendum)
[name]  [phone] [phone]     ID: [name] [name] DOB: [birth year]  MR#: [id number]  LNL#:[id number]  Patient Care Team: [name] Dy, MD as PCP - General (Internal Medicine) [name] Cruel, MD OTHER MD:  CHIEF COMPLAINT: Estrogen receptor positive breast cancer  CURRENT TREATMENT: Neoadjuvant anastrozole   BREAST CANCER HISTORY: From the original intake note:  The patient tells me she herself noted a mass in her right breast and brought it to her primary care physician's attention. However what she had at the [facility] 04/04/2016 was bilateral screening mammography with tomography The breast density was category C. In the right breast there was an area of possible distortion and the patient was brought back for right diagnostic mammography with tomography and right breast ultrasonography 04/18/2016. In the right breast lower outer quadrant there was a 1.9 cm spiculated mass. There were faint linear microcalcifications extending anteriorly and posteriorly from the mass. On palpation there was a firm fixed mass in the right breast at the 7:00 position, which on ultrasound measured 2.0 cm, 5 cm from the nipple at the 7:00 position. In the right axilla ultrasound showed 1 abnormal lymph node with asymmetric cortical thickening.  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  Patient's subsequent history is as detailed below  INTERVAL HISTORY: [name] [name] returns today for follow-up of her estrogen receptor positive breast cancer who that she is accompanied by HER-2 daughters [name] [name] and [name] [name].. She  continues on anastrozole, with good tolerance. She tells me she does not have problems with hot flashes or worsening arthralgias or myalgias. They obtain the drug at a good price.  REVIEW OF SYSTEMS: According to the patient and her family the patient has had some pain in her right ankle, with a "sore" there which is closely being followed by home health nursing. She describes the pain is constant and throbbing. She has ankle swelling. Sometimes she has chest pains. Chest is urinary incontinence. Chest arthritis here and there. 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 Sometimes she feels depressed. A detailed review of systems was otherwise stable  PAST MEDICAL HISTORY: Past Medical History:  Diagnosis Date  . Angina   . CHF (congestive heart failure) ([place])   . Complication of anesthesia    lung   . Diabetes mellitus ([place])   . GERD (gastroesophageal reflux disease)   . H/O hiatal hernia   . Hypercholesteremia   . Hypertension   . Myocardial infarction [facility])    CABG 2001, ? stent. Cath 02/2011 with new distal LIMA-LAD 80%. SVG-OM occlusion is old, rest of grafts patent  . Peripheral vascular disease ([place])   . Stroke [facility]) 2002 and 2003   Resultant right hemiparesis and aphasia. Communicates by writing     PAST SURGICAL HISTORY: Past Surgical History:  Procedure Laterality Date  . ABDOMINAL HYSTERECTOMY    . BREAST BIOPSY    . BREAST CYST EXCISION Left    years ago per daughter  . CARDIAC CATHETERIZATION  02/2011  . CORONARY ARTERY BYPASS GRAFT    . ESOPHAGOGASTRODUODENOSCOPY  03/12/2011   Procedure: ESOPHAGOGASTRODUODENOSCOPY (EGD);  Surgeon: [name] Beams, MD;  Location: [facility]  ENDOSCOPY;  Service: Endoscopy;  Laterality: N/A;  . LEFT HEART CATHETERIZATION WITH CORONARY ANGIOGRAM N/A 03/10/2011   Procedure: LEFT HEART CATHETERIZATION WITH CORONARY ANGIOGRAM;  Surgeon: [name] Page, MD;  Location[facility] CATH LAB;  Service: Cardiovascular;  Laterality: N/A;    FAMILY  HISTORY Family History  Problem Relation Age of Onset  . Heart disease Mother   . Stroke Father   . Diabetes Sister   . Diabetes Brother   . Anesthesia problems Neg Hx   . Hypotension Neg Hx   . Malignant hyperthermia Neg Hx   . Pseudochol deficiency Neg Hx   A stepsister had breast cancer diagnosed in her 90s   GYNECOLOGIC HISTORY:  No LMP recorded. Patient is postmenopausal.  menarche age 35, first live birth age 29., The patient is [name] [name] P2. She underwent remote hysterectomy, she thinks without salpingo-oophorectomy. She did not use hormone replacement.   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 She is widowed and lives independently. Her grandson [name] [name], 77 years old, is there at night. The patient has a nurse who comes during the day to help with activities of daily living. The patient tells me she is able to transfer from chair to toilet and back and from bed to chair and. She communicates by writing. She has 6 grandchildren and one great-grandchild. She attends [employer].     ADVANCED DIRECTIVES:  the patient's healthcare power of attorney is her daughter [name] [name], who can be reached at [phone]. The patient does have a living will in place.    HEALTH MAINTENANCE: Social History  Substance Use Topics  . Smoking status: Never Smoker  . Smokeless tobacco: Never Used  . Alcohol use No     Colonoscopy:  PAP:  Bone density:   Allergies  Allergen Reactions  . Penicillins Swelling    Swelling of mouth Has patient had a PCN reaction causing immediate rash, facial/tongue/throat swelling, SOB or lightheadedness with hypotension: YES Has patient had a PCN reaction causing severe rash involving mucus membranes or skin necrosis: NO Has patient had a PCN reaction that required hospitalization NO Has patient had a PCN reaction occurring within the last 10 years: NO If all of the above answers are "NO", then may proceed with Cephalosporin use.    Current  Outpatient Prescriptions  Medication Sig Dispense Refill  . anastrozole (ARIMIDEX) 1 MG tablet Take 1 tablet (1 mg total) by mouth daily. 90 tablet 4  . cholecalciferol (VITAMIN D) 1000 units tablet Take 1,000 Units by mouth daily.    . clopidogrel (PLAVIX) 75 MG tablet Take 75 mg by mouth daily.      Marland Kitchen escitalopram (LEXAPRO) 20 MG tablet Take 20 mg by mouth daily.    . furosemide (LASIX) 40 MG tablet Take 1 tablet (40 mg total) by mouth 2 (two) times daily. 60 tablet 0  . gabapentin (NEURONTIN) 100 MG capsule Take 1 capsule (100 mg total) by mouth 3 (three) times daily. 90 capsule 3  . insulin aspart (NOVOLOG) 100 UNIT/ML injection Inject 10 Units into the skin 3 (three) times daily with meals.    . insulin glargine (LANTUS) 100 UNIT/ML injection Inject 0.2 mLs (20 Units total) into the skin daily. 10 mL 11  . lidocaine (XYLOCAINE) 5 % ointment Apply 1 application topically 2 (two) times daily as needed. 150 g 1  . lisinopril (PRINIVIL,ZESTRIL) 5 MG tablet Take 1 tablet (5 mg total) by mouth daily. 30 tablet 6  .  metoprolol tartrate (LOPRESSOR) 25 MG tablet Take 0.5 tablets (12.5 mg total) by mouth 2 (two) times daily. (Patient not taking: Reported on 07/12/2016) 60 tablet 6  . nitroGLYCERIN (NITROSTAT) 0.4 MG SL tablet Place 0.4 mg under the tongue every 5 (five) minutes as needed for chest pain.    . potassium chloride SA (K-DUR,KLOR-CON) 20 MEQ tablet Take 1 tablet (20 mEq total) by mouth daily. 30 tablet 1  . rosuvastatin (CRESTOR) 40 MG tablet Take 40 mg by mouth daily.     No current facility-administered medications for this visit.     OBJECTIVE: Older African-American woman examined in a motorized wheelchair  Vitals:   07/22/16 1504  BP: (!) 130/59  Pulse: 63  Resp: 17  Temp: 97.9 F (36.6 C)     Body mass index is 28.9 kg/m.    ECOG FS:2 - Symptomatic, <50% confined to bed   Sclerae unicteric, pupils round and equal Oropharynx clear and moist No cervical or supraclavicular  adenopathy Lungs no rales or rhonchi Heart regular rate and rhythm Abd soft, nontender, positive bowel sounds MSK no focal spinal tenderness, no upper extremity lymphedema Neuro: Right body paresis as previously noted, communicates by writing, which is legible and grammatical, mood appropriate Breasts: The mass in the right breast above the nipple is a little less well-defined than before. There is no erythema or ulceration. I do not palpate any axillary adenopathy bilaterally  LAB RESULTS:  CMP     Component Value Date/Time   NA 135 07/17/2016 0358   K 4.1 07/17/2016 0358   CL 100 (L) 07/17/2016 0358   CO2 29 07/17/2016 0358   GLUCOSE 182 (H) 07/17/2016 0358   BUN 26 (H) 07/17/2016 0358   CREATININE 1.11 (H) 07/17/2016 0358   CALCIUM 9.0 07/17/2016 0358   PROT 6.9 07/12/2016 1206   ALBUMIN 3.5 07/12/2016 1206   AST 21 07/12/2016 1206   ALT 17 07/12/2016 1206   ALKPHOS 68 07/12/2016 1206   BILITOT 0.7 07/12/2016 1206   GFRNONAA 47 (L) 07/17/2016 0358   GFRAA 55 (L) 07/17/2016 0358    No results found for: TOTALPROTELP, ALBUMINELP, A1GS, A2GS, BETS, BETA2SER, GAMS, MSPIKE, SPEI  No results found for: KPAFRELGTCHN, LAMBDASER, KAPLAMBRATIO  Lab Results  Component Value Date   WBC 3.9 (L) 07/12/2016   NEUTROABS 2.3 07/12/2016   HGB 10.1 (L) 07/12/2016   HCT 33.7 (L) 07/12/2016   MCV 88.0 07/12/2016   PLT 187 07/12/2016      Chemistry      Component Value Date/Time   NA 135 07/17/2016 0358   K 4.1 07/17/2016 0358   CL 100 (L) 07/17/2016 0358   CO2 29 07/17/2016 0358   BUN 26 (H) 07/17/2016 0358   CREATININE 1.11 (H) 07/17/2016 0358      Component Value Date/Time   CALCIUM 9.0 07/17/2016 0358   ALKPHOS 68 07/12/2016 1206   AST 21 07/12/2016 1206   ALT 17 07/12/2016 1206   BILITOT 0.7 07/12/2016 1206       No results found for: LABCA2  No components found for: UXLKGM010  No results for input(s): INR in the last 168 hours.  Urinalysis    Component  Value Date/Time   COLORURINE YELLOW 06/07/2016 2025   APPEARANCEUR CLEAR 06/07/2016 2025   LABSPEC 1.023 06/07/2016 2025   PHURINE 5.0 06/07/2016 2025   GLUCOSEU 150 (A) 06/07/2016 2025   HGBUR NEGATIVE 06/07/2016 2025   BILIRUBINUR NEGATIVE 06/07/2016 2025   KETONESUR 5 (A) 06/07/2016 2025  PROTEINUR NEGATIVE 06/07/2016 2025   UROBILINOGEN 1.0 07/13/2013 2255   NITRITE NEGATIVE 06/07/2016 2025   LEUKOCYTESUR NEGATIVE 06/07/2016 2025     STUDIES: Dg Chest 2 View  Result Date: 07/12/2016 CLINICAL DATA:  Patient reports she has had a wound that has been getting worse X 1 month, patient denies any chest complaints at this time HX stroke, CHF EXAM: CHEST  2 VIEW COMPARISON:  06/07/2016 FINDINGS: Changes from CABG surgery are stable. Cardiac silhouette is mildly enlarged. No mediastinal or hilar masses. No evidence of adenopathy. There are prominent bronchovascular markings bilaterally similar to the prior study. There is opacity at the right lung base partly silhouetting the hemidiaphragm consistent with small pleural effusion. There is thickening along the fissures. No evidence of pneumonia.  No pneumothorax. Skeletal structures are demineralized. There is a mild wedge-shaped deformity at the thoracolumbar junction stable from a lateral chest radiograph dated 08/03/2015. IMPRESSION: 1. Mild cardiomegaly, prominent bronchovascular markings, thickening of the fissures and small right pleural effusion. Findings are similar to prior studies, suggesting a mild degree of chronic congestive heart failure. No evidence of pneumonia. Electronically Signed   By: Lajean Manes M.D.   On: 07/12/2016 12:33   Dg Ankle Complete Right  Result Date: 07/12/2016 CLINICAL DATA:  Patient reports she has had a wound that has been getting worse X 1 month, patient denies any chest complaints at this time HX stroke, CHF EXAM: RIGHT ANKLE - COMPLETE 3+ VIEW COMPARISON:  12/18/2014 FINDINGS: No fracture. There is a chronic  area of bony prominence and sclerosis along the anterolateral aspect of the distal tibia. There is no bone resorption to suggest osteomyelitis. Bones are demineralized. The ankle mortise is normally spaced and aligned. There is diffuse soft tissue swelling.  No soft tissue air. IMPRESSION: 1. No fracture or dislocation. 2. No evidence of osteomyelitis. Electronically Signed   By: Lajean Manes M.D.   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 Cardiac silhouette is mildly enlarged. No mediastinal hilar masses. Prominent bronchovascular markings and interstitial thickening has mildly improved. There is hazy opacity at the right lung base consistent with atelectasis possibly with a small effusion, stable. Mild thickening of the minor fissure appears less prominent than the previous day's study. No lung consolidation is seen to suggest pneumonia. There is no pneumothorax. IMPRESSION: 1. Mild improvement in lung aeration from the previous day's exam consistent with improved congestive heart failure/interstitial edema. No new abnormalities. Electronically Signed   By: Lajean Manes M.D.   On: 07/13/2016 07:40     ELIGIBLE FOR AVAILABLE RESEARCH PROTOCOL: no  ASSESSMENT: 76 y.o. [place] woman status post right breast lower outer quadrant biopsy 04/25/2016 for a clinical T1-T2, N2, stage IIA invasive lobular carcinoma, grade 2, E-cadherin negative, estrogen and progesterone receptor positive, HER-2 nonamplified, with an MIB-1 of 5%  (1) definitive surgery (optimally right mastectomy with targeted axillary dissection) pending  (a) patient is felt to be high surgical risk by cardiology  (2) anastrozole started 05/28/2016    PLAN: Rondia is tolerating anastrozole well and there is no compliance issue. It is hard for me to tell simply by  palpation whether we are having a response or tumor control. Certainly there has not been a dramatic response to date, and to measure that more accurately I think ultrasonography will be needed.  Accordingly  the plan is to continue anastrozole for now, and I have set the patient up for left breast and left axillary ultrasound early August. She will have been on anti-estrogens 4 months by then and we should be able to assess response.  At that time we can decide if we should add fulvestrant or simply continue anastrozole to maximal response.   At this point there is no definitive set plan for surgery but if surgery were to be considered she would need to have full staging studies prior to it given the fact that she has high risk  The patient and her family have a good understanding of the overall plan. [name] Cruel, MD   07/22/2016 3:13 PM Medical Oncology and Hematology [facility] [address] [place], [place] [address] Tel. [phone]    Fax. [phone]

## 2016-07-23 ENCOUNTER — Other Ambulatory Visit: Payer: Self-pay | Admitting: *Deleted

## 2016-08-18 ENCOUNTER — Ambulatory Visit (INDEPENDENT_AMBULATORY_CARE_PROVIDER_SITE_OTHER): Payer: Medicare HMO | Admitting: Orthotics

## 2016-08-18 DIAGNOSIS — L84 Corns and callosities: Secondary | ICD-10-CM

## 2016-08-18 DIAGNOSIS — E1142 Type 2 diabetes mellitus with diabetic polyneuropathy: Secondary | ICD-10-CM | POA: Diagnosis not present

## 2016-08-18 DIAGNOSIS — I739 Peripheral vascular disease, unspecified: Secondary | ICD-10-CM | POA: Diagnosis not present

## 2016-08-25 NOTE — Progress Notes (Signed)

## 2016-09-23 ENCOUNTER — Encounter: Payer: Self-pay | Admitting: Sports Medicine

## 2016-09-23 ENCOUNTER — Ambulatory Visit (INDEPENDENT_AMBULATORY_CARE_PROVIDER_SITE_OTHER): Payer: Medicare HMO | Admitting: Sports Medicine

## 2016-09-23 DIAGNOSIS — M79671 Pain in right foot: Secondary | ICD-10-CM

## 2016-09-23 DIAGNOSIS — E1142 Type 2 diabetes mellitus with diabetic polyneuropathy: Secondary | ICD-10-CM | POA: Diagnosis not present

## 2016-09-23 DIAGNOSIS — M79672 Pain in left foot: Secondary | ICD-10-CM

## 2016-09-23 DIAGNOSIS — I739 Peripheral vascular disease, unspecified: Secondary | ICD-10-CM

## 2016-09-23 DIAGNOSIS — M79676 Pain in unspecified toe(s): Secondary | ICD-10-CM | POA: Diagnosis not present

## 2016-09-23 DIAGNOSIS — B351 Tinea unguium: Secondary | ICD-10-CM | POA: Diagnosis not present

## 2016-09-23 NOTE — Progress Notes (Signed)
Subjective: [name] [name] is a 76 y.o. female patient with history of diabetes who returns to office today complaining of long, painful nails; unable to trim. Patient states that the glucose reading this morning was "good" and is assisted by daughter who reports this since patient is limited in speech due to stroke. Daughter denies any new changes in medication or new problems.   Patient Active Problem List   Diagnosis Date Noted  . Malnutrition of moderate degree 07/15/2016  . Diastolic CHF ([place]) 11/30/1217  . Ankle ulcer, right, limited to breakdown of skin ([place]) 07/12/2016  . Malignant neoplasm of lower-outer quadrant of right breast of female, estrogen receptor positive ([place]) 05/28/2016  . Chronic combined systolic and diastolic CHF (congestive heart failure) ([place]) 12/01/2015  . Acute encephalopathy 12/01/2015  . Hypoglycemia 12/01/2015  . Elevated troponin 12/01/2015  . Diabetes mellitus without complication ([place])   . Hypothermia   . Rhonchi   . Acute respiratory failure ([place]) 01/28/2014  . Respiratory distress 01/27/2014  . Hypoxia 01/27/2014  . CHF exacerbation ([place]) 01/27/2014  . Acute on chronic systolic CHF (congestive heart failure) ([place]) 01/27/2014  . Systolic CHF, acute on chronic ([place]) 01/27/2014  . Increased anion gap metabolic acidosis 75/88/3254  . Right foot ulcer ([place]) 01/27/2014  . Aspiration pneumonia ([place]) 06/23/2012  . Dysphagia 06/21/2012  . Palliative care encounter 06/20/2012  . Healthcare-associated pneumonia 06/16/2012  . TIA (transient ischemic attack) 05/05/2012  . N&V (nausea and vomiting) 05/04/2012  . Weakness generalized 05/04/2012  . 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 NSTEMI (non-ST elevated myocardial infarction) ([place]) 07/27/2011  . Diabetes mellitus ([place])   . Chest pain 03/10/2011  . HTN (hypertension) 03/10/2011  . Cerebral infarction ([place]) 03/10/2011  . CAD (coronary  artery disease), native coronary artery 03/10/2011  . 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 S/P PTCA (percutaneous transluminal coronary angioplasty) 03/10/2011   Current Outpatient Prescriptions on File Prior to Visit  Medication Sig Dispense Refill  . anastrozole (ARIMIDEX) 1 MG tablet Take 1 tablet (1 mg total) by mouth daily. 90 tablet 4  . cholecalciferol (VITAMIN D) 1000 units tablet Take 1,000 Units by mouth daily.    . clopidogrel (PLAVIX) 75 MG tablet Take 75 mg by mouth daily.      Marland Kitchen escitalopram (LEXAPRO) 20 MG tablet Take 20 mg by mouth daily.    . furosemide (LASIX) 40 MG tablet Take 1 tablet (40 mg total) by mouth 2 (two) times daily. 60 tablet 0  . gabapentin (NEURONTIN) 100 MG capsule Take 1 capsule (100 mg total) by mouth 3 (three) times daily. 90 capsule 3  . insulin aspart (NOVOLOG) 100 UNIT/ML injection Inject 10 Units into the skin 3 (three) times daily with meals.    . insulin glargine (LANTUS) 100 UNIT/ML injection Inject 0.2 mLs (20 Units total) into the skin daily. 10 mL 11  . lidocaine (XYLOCAINE) 5 % ointment Apply 1 application topically 2 (two) times daily as needed. 150 g 1  . lisinopril (PRINIVIL,ZESTRIL) 5 MG tablet Take 1 tablet (5 mg total) by mouth daily. 30 tablet 6  . metoprolol tartrate (LOPRESSOR) 25 MG tablet Take 0.5 tablets (12.5 mg total) by mouth 2 (two) times daily. (Patient not taking: Reported on 07/12/2016) 60 tablet 6  . nitroGLYCERIN (NITROSTAT) 0.4 MG SL tablet Place 0.4 mg under the tongue every 5 (five) minutes as needed for chest pain.    . potassium chloride SA (K-DUR,KLOR-CON) 20 MEQ  tablet Take 1 tablet (20 mEq total) by mouth daily. 30 tablet 1  . rosuvastatin (CRESTOR) 40 MG tablet Take 40 mg by mouth daily.     No current facility-administered medications on file prior to visit.    Allergies  Allergen Reactions  . Penicillins Swelling    Swelling of mouth Has patient had a PCN reaction causing immediate rash, facial/tongue/throat swelling, SOB  or lightheadedness with hypotension: YES Has patient had a PCN reaction causing severe rash involving mucus membranes or skin necrosis: NO Has patient had a PCN reaction that required hospitalization NO Has patient had a PCN reaction occurring within the last 10 years: NO If all of the above answers are "NO", then may proceed with Cephalosporin use.    Recent Results (from the past 2160 hour(s))  Comprehensive metabolic panel     Status: Abnormal   Collection Time: 07/12/16 12:06 PM  Result Value Ref Range   Sodium 139 135 - 145 mmol/L   Potassium 4.1 3.5 - 5.1 mmol/L   Chloride 107 101 - 111 mmol/L   CO2 26 22 - 32 mmol/L   Glucose, Bld 263 (H) 65 - 99 mg/dL   BUN 9 6 - 20 mg/dL   Creatinine, Ser 0.74 0.44 - 1.00 mg/dL   Calcium 9.1 8.9 - 10.3 mg/dL   Total Protein 6.9 6.5 - 8.1 g/dL   Albumin 3.5 3.5 - 5.0 g/dL   AST 21 15 - 41 U/L   ALT 17 14 - 54 U/L   Alkaline Phosphatase 68 38 - 126 U/L   Total Bilirubin 0.7 0.3 - 1.2 mg/dL   GFR calc non Af Amer >60 >60 mL/min   GFR calc Af Amer >60 >60 mL/min    Comment: (NOTE) The eGFR has been calculated using the CKD EPI equation. This calculation has not been validated in all clinical situations. eGFR's persistently <60 mL/min signify possible Chronic Kidney Disease.    Anion gap 6 5 - 15  CBC with Differential     Status: Abnormal   Collection Time: 07/12/16 12:06 PM  Result Value Ref Range   WBC 3.9 (L) 4.0 - 10.5 K/uL   RBC 3.83 (L) 3.87 - 5.11 MIL/uL   Hemoglobin 10.1 (L) 12.0 - 15.0 g/dL   HCT 33.7 (L) 36.0 - 46.0 %   MCV 88.0 78.0 - 100.0 fL   MCH 26.4 26.0 - 34.0 pg   MCHC 30.0 30.0 - 36.0 g/dL   RDW 16.3 (H) 11.5 - 15.5 %   Platelets 187 150 - 400 K/uL   Neutrophils Relative % 58 %   Neutro Abs 2.3 1.7 - 7.7 K/uL   Lymphocytes Relative 27 %   Lymphs Abs 1.1 0.7 - 4.0 K/uL   Monocytes Relative 11 %   Monocytes Absolute 0.4 0.1 - 1.0 K/uL   Eosinophils Relative 3 %   Eosinophils Absolute 0.1 0.0 - 0.7 K/uL    Basophils Relative 1 %   Basophils Absolute 0.0 0.0 - 0.1 K/uL  Brain natriuretic peptide     Status: Abnormal   Collection Time: 07/12/16 12:06 PM  Result Value Ref Range   B Natriuretic Peptide 810.5 (H) 0.0 - 100.0 pg/mL  Glucose, capillary     Status: Abnormal   Collection Time: 07/12/16  4:32 PM  Result Value Ref Range   Glucose-Capillary 230 (H) 65 - 99 mg/dL   Comment 1 Notify RN   Glucose, capillary     Status: Abnormal   Collection Time: 07/12/16  9:21 PM  Result Value Ref Range   Glucose-Capillary 250 (H) 65 - 99 mg/dL  Basic metabolic panel     Status: Abnormal   Collection Time: 07/13/16  3:40 AM  Result Value Ref Range   Sodium 139 135 - 145 mmol/L   Potassium 3.7 3.5 - 5.1 mmol/L   Chloride 101 101 - 111 mmol/L   CO2 28 22 - 32 mmol/L   Glucose, Bld 219 (H) 65 - 99 mg/dL   BUN 12 6 - 20 mg/dL   Creatinine, Ser 0.99 0.44 - 1.00 mg/dL   Calcium 9.2 8.9 - 10.3 mg/dL   GFR calc non Af Amer 54 (L) >60 mL/min   GFR calc Af Amer >60 >60 mL/min    Comment: (NOTE) The eGFR has been calculated using the CKD EPI equation. This calculation has not been validated in all clinical situations. eGFR's persistently <60 mL/min signify possible Chronic Kidney Disease.    Anion gap 10 5 - 15  Brain natriuretic peptide     Status: Abnormal   Collection Time: 07/13/16  3:40 AM  Result Value Ref Range   B Natriuretic Peptide 974.1 (H) 0.0 - 100.0 pg/mL  Glucose, capillary     Status: Abnormal   Collection Time: 07/13/16  7:34 AM  Result Value Ref Range   Glucose-Capillary 256 (H) 65 - 99 mg/dL   Comment 1 Notify RN   Glucose, capillary     Status: Abnormal   Collection Time: 07/13/16 11:40 AM  Result Value Ref Range   Glucose-Capillary 351 (H) 65 - 99 mg/dL   Comment 1 Notify RN   Glucose, capillary     Status: Abnormal   Collection Time: 07/13/16  4:31 PM  Result Value Ref Range   Glucose-Capillary 167 (H) 65 - 99 mg/dL   Comment 1 Notify RN   Glucose, capillary      Status: Abnormal   Collection Time: 07/13/16  9:19 PM  Result Value Ref Range   Glucose-Capillary 291 (H) 65 - 99 mg/dL  Basic metabolic panel     Status: Abnormal   Collection Time: 07/14/16  3:48 AM  Result Value Ref Range   Sodium 137 135 - 145 mmol/L   Potassium 3.4 (L) 3.5 - 5.1 mmol/L   Chloride 101 101 - 111 mmol/L   CO2 28 22 - 32 mmol/L   Glucose, Bld 181 (H) 65 - 99 mg/dL   BUN 17 6 - 20 mg/dL   Creatinine, Ser 0.91 0.44 - 1.00 mg/dL   Calcium 8.8 (L) 8.9 - 10.3 mg/dL   GFR calc non Af Amer >60 >60 mL/min   GFR calc Af Amer >60 >60 mL/min    Comment: (NOTE) The eGFR has been calculated using the CKD EPI equation. This calculation has not been validated in all clinical situations. eGFR's persistently <60 mL/min signify possible Chronic Kidney Disease.    Anion gap 8 5 - 15  Hemoglobin A1c     Status: Abnormal   Collection Time: 07/14/16  3:48 AM  Result Value Ref Range   Hgb A1c MFr Bld 7.5 (H) 4.8 - 5.6 %    Comment: (NOTE)         Pre-diabetes: 5.7 - 6.4         Diabetes: >6.4         Glycemic control for adults with diabetes: <7.0    Mean Plasma Glucose 169 mg/dL    Comment: (NOTE) Performed At: [facility] [place],  [place] [id number] Lindon Romp MD IW:[id number]   Glucose, capillary     Status: Abnormal   Collection Time: 07/14/16  7:46 AM  Result Value Ref Range   Glucose-Capillary 189 (H) 65 - 99 mg/dL  Glucose, capillary     Status: Abnormal   Collection Time: 07/14/16 11:49 AM  Result Value Ref Range   Glucose-Capillary 403 (H) 65 - 99 mg/dL  Basic metabolic panel     Status: Abnormal   Collection Time: 07/14/16 12:35 PM  Result Value Ref Range   Sodium 136 135 - 145 mmol/L   Potassium 3.9 3.5 - 5.1 mmol/L   Chloride 99 (L) 101 - 111 mmol/L   CO2 28 22 - 32 mmol/L   Glucose, Bld 399 (H) 65 - 99 mg/dL   BUN 17 6 - 20 mg/dL   Creatinine, Ser 0.87 0.44 - 1.00 mg/dL   Calcium 9.4 8.9 - 10.3 mg/dL   GFR calc non Af  Amer >60 >60 mL/min   GFR calc Af Amer >60 >60 mL/min    Comment: (NOTE) The eGFR has been calculated using the CKD EPI equation. This calculation has not been validated in all clinical situations. eGFR's persistently <60 mL/min signify possible Chronic Kidney Disease.    Anion gap 9 5 - 15  Glucose, capillary     Status: Abnormal   Collection Time: 07/14/16  2:44 PM  Result Value Ref Range   Glucose-Capillary 395 (H) 65 - 99 mg/dL  Glucose, capillary     Status: Abnormal   Collection Time: 07/14/16  4:02 PM  Result Value Ref Range   Glucose-Capillary 311 (H) 65 - 99 mg/dL   Comment 1 Notify RN   Glucose, capillary     Status: Abnormal   Collection Time: 07/14/16 10:39 PM  Result Value Ref Range   Glucose-Capillary 118 (H) 65 - 99 mg/dL   Comment 1 Notify RN    Comment 2 Document in Chart   Basic metabolic panel     Status: Abnormal   Collection Time: 07/15/16  5:25 AM  Result Value Ref Range   Sodium 138 135 - 145 mmol/L   Potassium 3.8 3.5 - 5.1 mmol/L   Chloride 101 101 - 111 mmol/L   CO2 27 22 - 32 mmol/L   Glucose, Bld 163 (H) 65 - 99 mg/dL   BUN 20 6 - 20 mg/dL   Creatinine, Ser 0.97 0.44 - 1.00 mg/dL   Calcium 9.2 8.9 - 10.3 mg/dL   GFR calc non Af Amer 56 (L) >60 mL/min   GFR calc Af Amer >60 >60 mL/min    Comment: (NOTE) The eGFR has been calculated using the CKD EPI equation. This calculation has not been validated in all clinical situations. eGFR's persistently <60 mL/min signify possible Chronic Kidney Disease.    Anion gap 10 5 - 15  Glucose, capillary     Status: Abnormal   Collection Time: 07/15/16  8:01 AM  Result Value Ref Range   Glucose-Capillary 160 (H) 65 - 99 mg/dL  Glucose, capillary     Status: Abnormal   Collection Time: 07/15/16 11:14 AM  Result Value Ref Range   Glucose-Capillary 374 (H) 65 - 99 mg/dL  Glucose, capillary     Status: Abnormal   Collection Time: 07/15/16  3:45 PM  Result Value Ref Range   Glucose-Capillary 126 (H) 65 -  99 mg/dL  Glucose, capillary     Status: Abnormal   Collection Time: 07/15/16 10:06 PM  Result  Value Ref Range   Glucose-Capillary 335 (H) 65 - 99 mg/dL   Comment 1 Notify RN    Comment 2 Document in Chart   Basic metabolic panel     Status: Abnormal   Collection Time: 07/16/16  4:05 AM  Result Value Ref Range   Sodium 138 135 - 145 mmol/L   Potassium 3.7 3.5 - 5.1 mmol/L   Chloride 101 101 - 111 mmol/L   CO2 29 22 - 32 mmol/L   Glucose, Bld 179 (H) 65 - 99 mg/dL   BUN 24 (H) 6 - 20 mg/dL   Creatinine, Ser 0.98 0.44 - 1.00 mg/dL   Calcium 9.0 8.9 - 10.3 mg/dL   GFR calc non Af Amer 55 (L) >60 mL/min   GFR calc Af Amer >60 >60 mL/min    Comment: (NOTE) The eGFR has been calculated using the CKD EPI equation. This calculation has not been validated in all clinical situations. eGFR's persistently <60 mL/min signify possible Chronic Kidney Disease.    Anion gap 8 5 - 15  Glucose, capillary     Status: Abnormal   Collection Time: 07/16/16  7:26 AM  Result Value Ref Range   Glucose-Capillary 213 (H) 65 - 99 mg/dL  Glucose, capillary     Status: Abnormal   Collection Time: 07/16/16 11:25 AM  Result Value Ref Range   Glucose-Capillary 234 (H) 65 - 99 mg/dL  Glucose, capillary     Status: Abnormal   Collection Time: 07/16/16  4:56 PM  Result Value Ref Range   Glucose-Capillary 61 (L) 65 - 99 mg/dL  Glucose, capillary     Status: None   Collection Time: 07/16/16  6:03 PM  Result Value Ref Range   Glucose-Capillary 80 65 - 99 mg/dL  Glucose, capillary     Status: Abnormal   Collection Time: 07/16/16  9:35 PM  Result Value Ref Range   Glucose-Capillary 161 (H) 65 - 99 mg/dL  Basic metabolic panel     Status: Abnormal   Collection Time: 07/17/16  3:58 AM  Result Value Ref Range   Sodium 135 135 - 145 mmol/L   Potassium 4.1 3.5 - 5.1 mmol/L   Chloride 100 (L) 101 - 111 mmol/L   CO2 29 22 - 32 mmol/L   Glucose, Bld 182 (H) 65 - 99 mg/dL   BUN 26 (H) 6 - 20 mg/dL   Creatinine,  Ser 1.11 (H) 0.44 - 1.00 mg/dL   Calcium 9.0 8.9 - 10.3 mg/dL   GFR calc non Af Amer 47 (L) >60 mL/min   GFR calc Af Amer 55 (L) >60 mL/min    Comment: (NOTE) The eGFR has been calculated using the CKD EPI equation. This calculation has not been validated in all clinical situations. eGFR's persistently <60 mL/min signify possible Chronic Kidney Disease.    Anion gap 6 5 - 15  Glucose, capillary     Status: Abnormal   Collection Time: 07/17/16  7:38 AM  Result Value Ref Range   Glucose-Capillary 191 (H) 65 - 99 mg/dL  Glucose, capillary     Status: Abnormal   Collection Time: 07/17/16 11:18 AM  Result Value Ref Range   Glucose-Capillary 229 (H) 65 - 99 mg/dL  Glucose, capillary     Status: Abnormal   Collection Time: 07/17/16  4:42 PM  Result Value Ref Range   Glucose-Capillary 52 (L) 65 - 99 mg/dL   Comment 1 Notify RN   Glucose, capillary     Status: None  Collection Time: 07/17/16  5:07 PM  Result Value Ref Range   Glucose-Capillary 87 65 - 99 mg/dL    Objective: General: Patient is awake, alert, and oriented x 3 and in no acute distress.Limited speech secondary to stroke with Wheelchair assisted gait  Integument: Skin is warm, dry and supple bilateral. Nails are tender, long, thickened and dystrophic with subungual debris, consistent with onychomycosis, 1-5 bilateral. No signs of infection. Venous abrasion at right lateral ankle with no signs of infection. Minimal callus 5th toes bilateral. Old burn well healed dorsum of right foot and dry callus heel bilateral. Remaining integument unremarkable.  Vasculature:  Dorsalis Pedis pulse 1/4 bilateral. Posterior Tibial pulse  0/4 bilateral. Capillary fill time <5 sec 1-5 bilateral. No hair growth to the level of the digits.Temperature gradient within normal limits. No varicosities present bilateral. +1 edema present bilateral R>L.   Neurology: The patient has absent sensation measured with a 5.07/10g Semmes Weinstein Monofilament  at all pedal sites bilateral . Vibratory sensation absent bilateral with tuning fork. No Babinski sign present bilateral. Subjective tingling bilateral.   Musculoskeletal: Asymptomatic hammertoe pedal deformities noted bilateral. Pain with light touch bilateral right>left foot and ankle. Muscular strength 4/5 on left 3/5 on right. No tenderness with calf compression bilateral.  Assessment and Plan: Problem List Items Addressed This Visit    None    Visit Diagnoses    Dermatophytosis of nail    -  Primary   PVD (peripheral vascular disease) ([place])       Diabetic polyneuropathy associated with type 2 diabetes mellitus (HCC)       Foot pain, bilateral          -Examined patient. -Discussed and educated patient on diabetic foot care, especially with  regards to the vascular, neurological and musculoskeletal systems.  -Stressed the importance of good glycemic control and the detriment of not controlling glucose levels in relation to the foot. -Mechanically debrided all nails 1-5 bilateral using sterile nail nipper and filed with dremel without incident  -Applied right lateral ankle antibiotic cream and bandaid; advised daughter to do the same until heals -Continue with diabetic shoes  -Follow up vascular studies for rest pain; patient's daughter states that she will get PCP to repeat these tests -Recommend follow up with PCP for lasix adjustment and gabapentin which was increased with improvement -Continue with lidocaine as needed for pain  -Answered all patient questions -Patient to return  in 3 months for at risk foot care -Patient advised to call the office if any problems or questions arise in the meantime.  Landis Martins, DPM

## 2016-10-07 ENCOUNTER — Ambulatory Visit (HOSPITAL_BASED_OUTPATIENT_CLINIC_OR_DEPARTMENT_OTHER): Payer: Medicare HMO | Admitting: Oncology

## 2016-10-07 VITALS — BP 144/66 | HR 66 | Temp 97.7°F | Resp 18

## 2016-10-07 DIAGNOSIS — Z170 Estrogen receptor positive status [ER+]: Secondary | ICD-10-CM

## 2016-10-07 DIAGNOSIS — Z79811 Long term (current) use of aromatase inhibitors: Secondary | ICD-10-CM

## 2016-10-07 DIAGNOSIS — I5023 Acute on chronic systolic (congestive) heart failure: Secondary | ICD-10-CM

## 2016-10-07 DIAGNOSIS — C50511 Malignant neoplasm of lower-outer quadrant of right female breast: Secondary | ICD-10-CM | POA: Diagnosis not present

## 2016-10-07 DIAGNOSIS — E119 Type 2 diabetes mellitus without complications: Secondary | ICD-10-CM

## 2016-10-07 NOTE — Progress Notes (Signed)
[place]  [phone] [phone]) [phone]     ID: [name] DOB: [birth year]  MR#: [id number]  JYN#:[id number]  Patient Care Team: [name], MD as PCP - General (Internal Medicine) [name], MD OTHER MD:  CHIEF COMPLAINT: Estrogen receptor positive breast cancer  CURRENT TREATMENT: Neoadjuvant anastrozole   BREAST CANCER HISTORY: From the original intake note:  The patient tells me she herself noted a mass in her right breast and brought it to her primary care physician's attention. However what she had at the [place] 04/04/2016 was bilateral screening mammography with tomography The breast density was category C. In the right breast there was an area of possible distortion and the patient was brought back for right diagnostic mammography with tomography and right breast ultrasonography 04/18/2016. In the right breast lower outer quadrant there was a 1.9 cm spiculated mass. There were faint linear microcalcifications extending anteriorly and posteriorly from the mass. On palpation there was a firm fixed mass in the right breast at the 7:00 position, which on ultrasound measured 2.0 cm, 5 cm from the nipple at the 7:00 position. In the right axilla ultrasound showed 1 abnormal lymph node with asymmetric cortical thickening.  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  Patient's subsequent history is as detailed below  INTERVAL HISTORY: Ms Cuervo returns today for follow-up of her estrogen receptor positive breast cancer, which is being treated with anastrozole in an attempt to avoid surgery in  this high risk patients. She is accompanied by one of her daughters.  The patient and the daughter tell me that Ms. Yoshino is tolerating anastrozole well. Hot flashes are not a problem. The patient does have some vaginal dryness issues but these are not new. They are obtaining the drug at a good price.  The patient wrote for me that "I've been checking it it seem to be going away"--she is encouraged that by her own exam at least the breast cancer seems to be getting softer and smaller.  REVIEW OF SYSTEMS: The patient's right body paresis is unchanged. Overall review of systems is stable  PAST MEDICAL HISTORY: Past Medical History:  Diagnosis Date  . Angina   . CHF (congestive heart failure) ([place])   . Complication of anesthesia    lung   . Diabetes mellitus ([place])   . GERD (gastroesophageal reflux disease)   . H/O hiatal hernia   . Hypercholesteremia   . Hypertension   . Myocardial infarction [facility])    CABG 2001, ? stent. Cath 02/2011 with new distal LIMA-LAD 80%. SVG-OM occlusion is old, rest of grafts patent  . Peripheral vascular disease ([place])   . Stroke [facility]) 2002 and 2003   Resultant right hemiparesis and aphasia. Communicates by writing     PAST SURGICAL HISTORY: Past Surgical History:  Procedure Laterality Date  . ABDOMINAL HYSTERECTOMY    . BREAST BIOPSY    . BREAST CYST EXCISION Left    years ago per daughter  . CARDIAC CATHETERIZATION  02/2011  . CORONARY ARTERY BYPASS GRAFT    . ESOPHAGOGASTRODUODENOSCOPY  03/12/2011   Procedure: ESOPHAGOGASTRODUODENOSCOPY (EGD);  Surgeon: [name], MD;  Location: [facility];  Service: Endoscopy;  Laterality: N/A;  . LEFT HEART CATHETERIZATION WITH CORONARY ANGIOGRAM N/A 03/10/2011   Procedure: LEFT HEART CATHETERIZATION WITH CORONARY ANGIOGRAM;  Surgeon: [name], MD;  Location: [facility] CATH LAB;  Service: Cardiovascular;  Laterality: N/A;    FAMILY HISTORY Family History  Problem Relation Age of Onset  . Heart disease  Mother   . Stroke Father   . Diabetes Sister   . Diabetes Brother   . Anesthesia problems Neg Hx   . Hypotension Neg Hx   . Malignant hyperthermia Neg Hx   . Pseudochol deficiency Neg Hx   A stepsister had breast cancer diagnosed in her 90s   GYNECOLOGIC HISTORY:  No LMP recorded. Patient is postmenopausal.  menarche age 9, first live birth age 25., The patient is [place] P2. She underwent remote hysterectomy, she thinks without salpingo-oophorectomy. She did not use hormone replacement.   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 She is widowed and lives independently. Her grandson [name], 39 years old, is there at night. The patient has a nurse who comes during the day to help with activities of daily living. The patient tells me she is able to transfer from chair to toilet and back and from bed to chair and. She communicates by writing. She has 6 grandchildren and one great-grandchild. She attends [employer].     ADVANCED DIRECTIVES:  the patient's healthcare power of attorney is her daughter [name], who can be reached at [phone]. The patient does have a living will in place.    HEALTH MAINTENANCE: Social History  Substance Use Topics  . Smoking status: Never Smoker  . Smokeless tobacco: Never Used  . Alcohol use No     Colonoscopy:  PAP:  Bone density:   Allergies  Allergen Reactions  . Penicillins Swelling    Swelling of mouth Has patient had a PCN reaction causing immediate rash, facial/tongue/throat swelling, SOB or lightheadedness with hypotension: YES Has patient had a PCN reaction causing severe rash involving mucus membranes or skin necrosis: NO Has patient had a PCN reaction that required hospitalization NO Has patient had a PCN reaction occurring within the last 10 years: NO If all of the above answers are "NO", then may proceed with Cephalosporin use.    Current Outpatient Prescriptions  Medication Sig Dispense Refill  . anastrozole  (ARIMIDEX) 1 MG tablet Take 1 tablet (1 mg total) by mouth daily. 90 tablet 4  . cholecalciferol (VITAMIN D) 1000 units tablet Take 1,000 Units by mouth daily.    . clopidogrel (PLAVIX) 75 MG tablet Take 75 mg by mouth daily.      Marland Kitchen escitalopram (LEXAPRO) 20 MG tablet Take 20 mg by mouth daily.    . furosemide (LASIX) 40 MG tablet Take 1 tablet (40 mg total) by mouth 2 (two) times daily. 60 tablet 0  . gabapentin (NEURONTIN) 100 MG capsule Take 1 capsule (100 mg total) by mouth 3 (three) times daily. 90 capsule 3  . insulin aspart (NOVOLOG) 100 UNIT/ML injection Inject 10 Units into the skin 3 (three) times daily with meals.    . insulin glargine (LANTUS) 100 UNIT/ML injection Inject 0.2 mLs (20 Units total) into the skin daily. 10 mL 11  . lidocaine (XYLOCAINE) 5 % ointment Apply 1 application topically 2 (two) times daily as needed. 150 g 1  . lisinopril (PRINIVIL,ZESTRIL) 5 MG tablet Take 1 tablet (5 mg total) by mouth daily. 30 tablet 6  . metoprolol tartrate (  LOPRESSOR) 25 MG tablet Take 0.5 tablets (12.5 mg total) by mouth 2 (two) times daily. (Patient not taking: Reported on 07/12/2016) 60 tablet 6  . nitroGLYCERIN (NITROSTAT) 0.4 MG SL tablet Place 0.4 mg under the tongue every 5 (five) minutes as needed for chest pain.    . potassium chloride SA (K-DUR,KLOR-CON) 20 MEQ tablet Take 1 tablet (20 mEq total) by mouth daily. 30 tablet 1  . rosuvastatin (CRESTOR) 40 MG tablet Take 40 mg by mouth daily.     No current facility-administered medications for this visit.     OBJECTIVE: Older African-American woman Examined in a wheelchair Vitals:   10/07/16 1550  BP: (!) 144/66  Pulse: 66  Resp: 18  Temp: 97.7 F (36.5 C)  SpO2: 100%     There is no height or weight on file to calculate BMI.    ECOG FS:2 - Symptomatic, <50% confined to bed  Sclerae unicteric, EOMs intact Oropharynx clear and moist; drooling secondary to earlier stroke No cervical or supraclavicular adenopathy Lungs no  rales or rhonchi Heart regular rate and rhythm Abd soft, nontender, positive bowel sounds Neuro: Right body paresis as before; communicates by writing; very responsive and appropriate Breasts: It is difficult to palpate a well-defined mass in the right breast. There are no skin or nipple changes of concern. Left breast is benign. Both axillae are benign.  LAB RESULTS:  CMP     Component Value Date/Time   NA 135 07/17/2016 0358   K 4.1 07/17/2016 0358   CL 100 (L) 07/17/2016 0358   CO2 29 07/17/2016 0358   GLUCOSE 182 (H) 07/17/2016 0358   BUN 26 (H) 07/17/2016 0358   CREATININE 1.11 (H) 07/17/2016 0358   CALCIUM 9.0 07/17/2016 0358   PROT 6.9 07/12/2016 1206   ALBUMIN 3.5 07/12/2016 1206   AST 21 07/12/2016 1206   ALT 17 07/12/2016 1206   ALKPHOS 68 07/12/2016 1206   BILITOT 0.7 07/12/2016 1206   GFRNONAA 47 (L) 07/17/2016 0358   GFRAA 55 (L) 07/17/2016 0358    No results found for: TOTALPROTELP, ALBUMINELP, A1GS, A2GS, BETS, BETA2SER, GAMS, MSPIKE, SPEI  No results found for: KPAFRELGTCHN, LAMBDASER, KAPLAMBRATIO  Lab Results  Component Value Date   WBC 3.9 (L) 07/12/2016   NEUTROABS 2.3 07/12/2016   HGB 10.1 (L) 07/12/2016   HCT 33.7 (L) 07/12/2016   MCV 88.0 07/12/2016   PLT 187 07/12/2016      Chemistry      Component Value Date/Time   NA 135 07/17/2016 0358   K 4.1 07/17/2016 0358   CL 100 (L) 07/17/2016 0358   CO2 29 07/17/2016 0358   BUN 26 (H) 07/17/2016 0358   CREATININE 1.11 (H) 07/17/2016 0358      Component Value Date/Time   CALCIUM 9.0 07/17/2016 0358   ALKPHOS 68 07/12/2016 1206   AST 21 07/12/2016 1206   ALT 17 07/12/2016 1206   BILITOT 0.7 07/12/2016 1206       No results found for: LABCA2  No components found for: [id number]  No results for input(s): INR in the last 168 hours.  Urinalysis    Component Value Date/Time   COLORURINE YELLOW 06/07/2016 2025   APPEARANCEUR CLEAR 06/07/2016 2025   LABSPEC 1.023 06/07/2016 2025    PHURINE 5.0 06/07/2016 2025   GLUCOSEU 150 (A) 06/07/2016 2025   HGBUR NEGATIVE 06/07/2016 2025   BILIRUBINUR NEGATIVE 06/07/2016 2025   KETONESUR 5 (A) 06/07/2016 2025   PROTEINUR NEGATIVE 06/07/2016 2025  UROBILINOGEN 1.0 07/13/2013 2255   NITRITE NEGATIVE 06/07/2016 2025   LEUKOCYTESUR NEGATIVE 06/07/2016 2025     STUDIES: She had been scheduled for right breast ultrasonography but for some reason this was never performed  ELIGIBLE FOR AVAILABLE RESEARCH PROTOCOL: no  ASSESSMENT: 75 y.o. [place] woman status post right breast lower outer quadrant biopsy 04/25/2016 for a clinical T1-T2, N2, stage IIA invasive lobular carcinoma, grade 2, E-cadherin negative, estrogen and progesterone receptor positive, HER-2 nonamplified, with an MIB-1 of 5%  (1) definitive surgery (optimally right mastectomy with targeted axillary dissection) pending  (a) patient is felt to be high surgical risk by cardiology  (2) anastrozole started 05/28/2016    PLAN: Soo is now 4 months into neoadjuvant treatment with anastrozole. By my exam the mass in the right breast is softer and less well-defined.  It will be helpful to have ultrasound confirmation of this. I have gone ahead and reentered the order for next week. I asked the daughter to please contact us if they have not heard from the [facility] by late next week. I also asked her to prompt [place] to call them with results, since in the press of things we sometimes end up not calling people with results in a timely manner.  She will see me again in 3 months. Assuming things continue to go well I will repeat an ultrasound 6 months from now  They know to call for any other issues that may develop before the next visit here. [name], MD   10/07/2016 4:03 PM Medical Oncology and Hematology [facility] [address] [place], [place] [address] Tel. [phone]    Fax. [phone]

## 2016-10-09 ENCOUNTER — Ambulatory Visit: Payer: Medicare HMO | Admitting: Oncology

## 2016-11-26 ENCOUNTER — Ambulatory Visit
Admission: RE | Admit: 2016-11-26 | Discharge: 2016-11-26 | Disposition: A | Discharge status: Home / Self Care | Payer: Medicare HMO | Source: Ambulatory Visit | Attending: Oncology | Admitting: Oncology

## 2016-11-30 ENCOUNTER — Encounter: Payer: Self-pay | Admitting: Oncology

## 2016-12-09 ENCOUNTER — Ambulatory Visit: Payer: Medicare HMO | Admitting: Sports Medicine

## 2016-12-29 ENCOUNTER — Emergency Department (HOSPITAL_COMMUNITY): Payer: Medicare HMO

## 2016-12-29 ENCOUNTER — Inpatient Hospital Stay (HOSPITAL_COMMUNITY): Payer: Medicare HMO

## 2016-12-29 ENCOUNTER — Inpatient Hospital Stay (HOSPITAL_COMMUNITY)
Admission: EM | Admit: 2016-12-29 | Discharge: 2017-01-06 | DRG: 280 | Disposition: A | Discharge status: Skilled Nursing Facility | Payer: Medicare HMO | Attending: Internal Medicine | Admitting: Internal Medicine

## 2016-12-29 DIAGNOSIS — J9602 Acute respiratory failure with hypercapnia: Secondary | ICD-10-CM | POA: Diagnosis present

## 2016-12-29 DIAGNOSIS — I214 Non-ST elevation (NSTEMI) myocardial infarction: Principal | ICD-10-CM | POA: Diagnosis present

## 2016-12-29 DIAGNOSIS — L89302 Pressure ulcer of unspecified buttock, stage 2: Secondary | ICD-10-CM | POA: Diagnosis not present

## 2016-12-29 DIAGNOSIS — E1159 Type 2 diabetes mellitus with other circulatory complications: Secondary | ICD-10-CM | POA: Diagnosis not present

## 2016-12-29 DIAGNOSIS — Z7902 Long term (current) use of antithrombotics/antiplatelets: Secondary | ICD-10-CM

## 2016-12-29 DIAGNOSIS — R579 Shock, unspecified: Secondary | ICD-10-CM | POA: Diagnosis present

## 2016-12-29 DIAGNOSIS — Z90710 Acquired absence of both cervix and uterus: Secondary | ICD-10-CM

## 2016-12-29 DIAGNOSIS — E1151 Type 2 diabetes mellitus with diabetic peripheral angiopathy without gangrene: Secondary | ICD-10-CM | POA: Diagnosis present

## 2016-12-29 DIAGNOSIS — L8990 Pressure ulcer of unspecified site, unspecified stage: Secondary | ICD-10-CM

## 2016-12-29 DIAGNOSIS — E8809 Other disorders of plasma-protein metabolism, not elsewhere classified: Secondary | ICD-10-CM | POA: Diagnosis present

## 2016-12-29 DIAGNOSIS — I6381 Other cerebral infarction due to occlusion or stenosis of small artery: Secondary | ICD-10-CM | POA: Diagnosis not present

## 2016-12-29 DIAGNOSIS — D696 Thrombocytopenia, unspecified: Secondary | ICD-10-CM | POA: Diagnosis not present

## 2016-12-29 DIAGNOSIS — E876 Hypokalemia: Secondary | ICD-10-CM | POA: Diagnosis present

## 2016-12-29 DIAGNOSIS — G255 Other chorea: Secondary | ICD-10-CM | POA: Diagnosis not present

## 2016-12-29 DIAGNOSIS — I25810 Atherosclerosis of coronary artery bypass graft(s) without angina pectoris: Secondary | ICD-10-CM | POA: Diagnosis present

## 2016-12-29 DIAGNOSIS — E785 Hyperlipidemia, unspecified: Secondary | ICD-10-CM | POA: Diagnosis present

## 2016-12-29 DIAGNOSIS — E872 Acidosis: Secondary | ICD-10-CM | POA: Diagnosis not present

## 2016-12-29 DIAGNOSIS — I259 Chronic ischemic heart disease, unspecified: Secondary | ICD-10-CM

## 2016-12-29 DIAGNOSIS — Z951 Presence of aortocoronary bypass graft: Secondary | ICD-10-CM

## 2016-12-29 DIAGNOSIS — R402122 Coma scale, eyes open, to pain, at arrival to emergency department: Secondary | ICD-10-CM | POA: Diagnosis present

## 2016-12-29 DIAGNOSIS — Z794 Long term (current) use of insulin: Secondary | ICD-10-CM

## 2016-12-29 DIAGNOSIS — I130 Hypertensive heart and chronic kidney disease with heart failure and stage 1 through stage 4 chronic kidney disease, or unspecified chronic kidney disease: Secondary | ICD-10-CM | POA: Diagnosis present

## 2016-12-29 DIAGNOSIS — R402342 Coma scale, best motor response, flexion withdrawal, at arrival to emergency department: Secondary | ICD-10-CM | POA: Diagnosis present

## 2016-12-29 DIAGNOSIS — I469 Cardiac arrest, cause unspecified: Secondary | ICD-10-CM

## 2016-12-29 DIAGNOSIS — M79672 Pain in left foot: Secondary | ICD-10-CM | POA: Diagnosis not present

## 2016-12-29 DIAGNOSIS — Z66 Do not resuscitate: Secondary | ICD-10-CM | POA: Diagnosis present

## 2016-12-29 DIAGNOSIS — N179 Acute kidney failure, unspecified: Secondary | ICD-10-CM | POA: Diagnosis present

## 2016-12-29 DIAGNOSIS — Z955 Presence of coronary angioplasty implant and graft: Secondary | ICD-10-CM

## 2016-12-29 DIAGNOSIS — I462 Cardiac arrest due to underlying cardiac condition: Secondary | ICD-10-CM | POA: Diagnosis present

## 2016-12-29 DIAGNOSIS — Z9911 Dependence on respirator [ventilator] status: Secondary | ICD-10-CM

## 2016-12-29 DIAGNOSIS — Z7189 Other specified counseling: Secondary | ICD-10-CM | POA: Diagnosis not present

## 2016-12-29 DIAGNOSIS — I10 Essential (primary) hypertension: Secondary | ICD-10-CM | POA: Diagnosis not present

## 2016-12-29 DIAGNOSIS — N183 Chronic kidney disease, stage 3 (moderate): Secondary | ICD-10-CM | POA: Diagnosis present

## 2016-12-29 DIAGNOSIS — C50919 Malignant neoplasm of unspecified site of unspecified female breast: Secondary | ICD-10-CM | POA: Diagnosis present

## 2016-12-29 DIAGNOSIS — J9601 Acute respiratory failure with hypoxia: Secondary | ICD-10-CM | POA: Diagnosis present

## 2016-12-29 DIAGNOSIS — I69351 Hemiplegia and hemiparesis following cerebral infarction affecting right dominant side: Secondary | ICD-10-CM

## 2016-12-29 DIAGNOSIS — I5023 Acute on chronic systolic (congestive) heart failure: Secondary | ICD-10-CM | POA: Diagnosis present

## 2016-12-29 DIAGNOSIS — R52 Pain, unspecified: Secondary | ICD-10-CM

## 2016-12-29 DIAGNOSIS — Z880 Allergy status to penicillin: Secondary | ICD-10-CM

## 2016-12-29 DIAGNOSIS — E1122 Type 2 diabetes mellitus with diabetic chronic kidney disease: Secondary | ICD-10-CM | POA: Diagnosis present

## 2016-12-29 DIAGNOSIS — R29810 Facial weakness: Secondary | ICD-10-CM | POA: Diagnosis not present

## 2016-12-29 DIAGNOSIS — I252 Old myocardial infarction: Secondary | ICD-10-CM

## 2016-12-29 DIAGNOSIS — Z515 Encounter for palliative care: Secondary | ICD-10-CM | POA: Diagnosis not present

## 2016-12-29 DIAGNOSIS — E1165 Type 2 diabetes mellitus with hyperglycemia: Secondary | ICD-10-CM | POA: Diagnosis present

## 2016-12-29 DIAGNOSIS — Z833 Family history of diabetes mellitus: Secondary | ICD-10-CM

## 2016-12-29 DIAGNOSIS — R402212 Coma scale, best verbal response, none, at arrival to emergency department: Secondary | ICD-10-CM | POA: Diagnosis present

## 2016-12-29 DIAGNOSIS — I5021 Acute systolic (congestive) heart failure: Secondary | ICD-10-CM | POA: Diagnosis not present

## 2016-12-29 DIAGNOSIS — I959 Hypotension, unspecified: Secondary | ICD-10-CM | POA: Diagnosis present

## 2016-12-29 DIAGNOSIS — Z79899 Other long term (current) drug therapy: Secondary | ICD-10-CM

## 2016-12-29 DIAGNOSIS — Z823 Family history of stroke: Secondary | ICD-10-CM

## 2016-12-29 DIAGNOSIS — I4901 Ventricular fibrillation: Secondary | ICD-10-CM | POA: Diagnosis present

## 2016-12-29 DIAGNOSIS — J9690 Respiratory failure, unspecified, unspecified whether with hypoxia or hypercapnia: Secondary | ICD-10-CM

## 2016-12-29 DIAGNOSIS — I255 Ischemic cardiomyopathy: Secondary | ICD-10-CM | POA: Diagnosis present

## 2016-12-29 DIAGNOSIS — G9349 Other encephalopathy: Secondary | ICD-10-CM | POA: Diagnosis present

## 2016-12-29 DIAGNOSIS — I69320 Aphasia following cerebral infarction: Secondary | ICD-10-CM

## 2016-12-29 DIAGNOSIS — E7800 Pure hypercholesterolemia, unspecified: Secondary | ICD-10-CM | POA: Diagnosis present

## 2016-12-29 DIAGNOSIS — K219 Gastro-esophageal reflux disease without esophagitis: Secondary | ICD-10-CM | POA: Diagnosis present

## 2016-12-29 DIAGNOSIS — R740 Nonspecific elevation of levels of transaminase and lactic acid dehydrogenase [LDH]: Secondary | ICD-10-CM

## 2016-12-29 DIAGNOSIS — Z8249 Family history of ischemic heart disease and other diseases of the circulatory system: Secondary | ICD-10-CM

## 2016-12-29 DIAGNOSIS — I639 Cerebral infarction, unspecified: Secondary | ICD-10-CM | POA: Diagnosis not present

## 2016-12-29 DIAGNOSIS — R4701 Aphasia: Secondary | ICD-10-CM | POA: Diagnosis not present

## 2016-12-29 DIAGNOSIS — L89152 Pressure ulcer of sacral region, stage 2: Secondary | ICD-10-CM | POA: Diagnosis not present

## 2016-12-29 DIAGNOSIS — Z993 Dependence on wheelchair: Secondary | ICD-10-CM

## 2016-12-29 DIAGNOSIS — D638 Anemia in other chronic diseases classified elsewhere: Secondary | ICD-10-CM | POA: Diagnosis not present

## 2016-12-29 DIAGNOSIS — E11622 Type 2 diabetes mellitus with other skin ulcer: Secondary | ICD-10-CM | POA: Diagnosis present

## 2016-12-29 DIAGNOSIS — R7401 Elevation of levels of liver transaminase levels: Secondary | ICD-10-CM

## 2016-12-29 HISTORY — DX: Cardiac arrest, cause unspecified: I46.9

## 2016-12-29 LAB — POCT I-STAT 3, ART BLOOD GAS (G3+)
Acid-base deficit: 3 mmol/L — ABNORMAL HIGH (ref 0.0–2.0)
BICARBONATE: 22.6 mmol/L (ref 20.0–28.0)
O2 SAT: 85 %
PCO2 ART: 42.4 mmHg (ref 32.0–48.0)
PH ART: 7.334 — AB (ref 7.350–7.450)
PO2 ART: 53 mmHg — AB (ref 83.0–108.0)
Patient temperature: 98.6
TCO2: 24 mmol/L (ref 22–32)

## 2016-12-29 LAB — TRIGLYCERIDES: Triglycerides: 138 mg/dL (ref <150)

## 2016-12-29 LAB — TROPONIN I: TROPONIN I: 0.05 ng/mL — AB (ref <0.03)

## 2016-12-29 LAB — GLUCOSE, CAPILLARY
Glucose-Capillary: 94 mg/dL (ref 65–99)
Glucose-Capillary: 168 mg/dL — ABNORMAL HIGH (ref 65–99)

## 2016-12-29 LAB — CBG MONITORING, ED: Glucose-Capillary: 166 mg/dL — ABNORMAL HIGH (ref 65–99)

## 2016-12-29 LAB — COMPREHENSIVE METABOLIC PANEL
ALK PHOS: 91 U/L (ref 38–126)
ALT: 158 U/L — AB (ref 14–54)
AST: 265 U/L — ABNORMAL HIGH (ref 15–41)
Albumin: 3.3 g/dL — ABNORMAL LOW (ref 3.5–5.0)
Anion gap: 11 (ref 5–15)
BUN: 20 mg/dL (ref 6–20)
CALCIUM: 8.4 mg/dL — AB (ref 8.9–10.3)
CO2: 23 mmol/L (ref 22–32)
CREATININE: 1.41 mg/dL — AB (ref 0.44–1.00)
Chloride: 105 mmol/L (ref 101–111)
GFR calc non Af Amer: 35 mL/min — ABNORMAL LOW (ref >60)
GFR, EST AFRICAN AMERICAN: 41 mL/min — AB (ref >60)
GLUCOSE: 241 mg/dL — AB (ref 65–99)
Potassium: 3.2 mmol/L — ABNORMAL LOW (ref 3.5–5.1)
SODIUM: 139 mmol/L (ref 135–145)
Total Bilirubin: 0.7 mg/dL (ref 0.3–1.2)
Total Protein: 6.7 g/dL (ref 6.5–8.1)

## 2016-12-29 LAB — MAGNESIUM: MAGNESIUM: 1.7 mg/dL (ref 1.7–2.4)

## 2016-12-29 LAB — I-STAT TROPONIN, ED: TROPONIN I, POC: 0.03 ng/mL (ref 0.00–0.08)

## 2016-12-29 LAB — I-STAT CG4 LACTIC ACID, ED: Lactic Acid, Venous: 4.07 mmol/L (ref 0.5–1.9)

## 2016-12-29 LAB — I-STAT CHEM 8, ED
BUN: 17 mg/dL (ref 6–20)
CHLORIDE: 101 mmol/L (ref 101–111)
Calcium, Ion: 1.18 mmol/L (ref 1.15–1.40)
Creatinine, Ser: 1.2 mg/dL — ABNORMAL HIGH (ref 0.44–1.00)
Glucose, Bld: 179 mg/dL — ABNORMAL HIGH (ref 65–99)
HEMATOCRIT: 47 % — AB (ref 36.0–46.0)
Hemoglobin: 16 g/dL — ABNORMAL HIGH (ref 12.0–15.0)
POTASSIUM: 3.2 mmol/L — AB (ref 3.5–5.1)
SODIUM: 141 mmol/L (ref 135–145)
TCO2: 26 mmol/L (ref 22–32)

## 2016-12-29 LAB — I-STAT ARTERIAL BLOOD GAS, ED
BICARBONATE: 27.4 mmol/L (ref 20.0–28.0)
O2 Saturation: 100 %
PH ART: 7.295 — AB (ref 7.350–7.450)
TCO2: 29 mmol/L (ref 22–32)
pCO2 arterial: 56.3 mmHg — ABNORMAL HIGH (ref 32.0–48.0)
pO2, Arterial: 234 mmHg — ABNORMAL HIGH (ref 83.0–108.0)

## 2016-12-29 LAB — HEMOGLOBIN A1C
HEMOGLOBIN A1C: 9.4 % — AB (ref 4.8–5.6)
Mean Plasma Glucose: 223.08 mg/dL

## 2016-12-29 LAB — PHOSPHORUS: Phosphorus: 4.5 mg/dL (ref 2.5–4.6)

## 2016-12-29 LAB — LACTIC ACID, PLASMA: Lactic Acid, Venous: 3.6 mmol/L (ref 0.5–1.9)

## 2016-12-29 MED ORDER — NOREPINEPHRINE BITARTRATE 1 MG/ML IV SOLN
0.0000 ug/min | INTRAVENOUS | Status: DC
  Administered 2016-12-29: 8 ug/min via INTRAVENOUS
  Administered 2016-12-30: 10 ug/min via INTRAVENOUS
  Filled 2016-12-29 (×2): qty 4

## 2016-12-29 MED ORDER — ATORVASTATIN CALCIUM 40 MG PO TABS
40.0000 mg | ORAL_TABLET | Freq: Every day | ORAL | Status: DC
  Filled 2016-12-29: qty 1

## 2016-12-29 MED ORDER — CLOPIDOGREL BISULFATE 75 MG PO TABS
75.0000 mg | ORAL_TABLET | Freq: Every day | ORAL | Status: DC
  Administered 2016-12-30: 75 mg
  Filled 2016-12-29 (×2): qty 1

## 2016-12-29 MED ORDER — PROPOFOL 1000 MG/100ML IV EMUL
0.0000 ug/kg/min | INTRAVENOUS | Status: DC
  Administered 2016-12-29: 35 ug/kg/min via INTRAVENOUS
  Administered 2016-12-30: 10 ug/kg/min via INTRAVENOUS
  Administered 2016-12-30: 20 ug/kg/min via INTRAVENOUS
  Administered 2016-12-30: 5 ug/kg/min via INTRAVENOUS
  Administered 2016-12-31: 20 ug/kg/min via INTRAVENOUS
  Filled 2016-12-29 (×5): qty 100

## 2016-12-29 MED ORDER — PROPOFOL BOLUS VIA INFUSION
0.0000 mg/kg | INTRAVENOUS | Status: DC
  Filled 2016-12-29: qty 3770

## 2016-12-29 MED ORDER — METOPROLOL TARTRATE 5 MG/5ML IV SOLN
2.5000 mg | Freq: Four times a day (QID) | INTRAVENOUS | Status: DC
  Administered 2016-12-31 – 2017-01-03 (×11): 2.5 mg via INTRAVENOUS
  Filled 2016-12-29 (×19): qty 5

## 2016-12-29 MED ORDER — HEPARIN (PORCINE) IN NACL 100-0.45 UNIT/ML-% IJ SOLN
950.0000 [IU]/h | INTRAMUSCULAR | Status: DC
  Administered 2016-12-29: 1050 [IU]/h via INTRAVENOUS
  Administered 2016-12-30: 950 [IU]/h via INTRAVENOUS
  Filled 2016-12-29 (×4): qty 250

## 2016-12-29 MED ORDER — ATORVASTATIN CALCIUM 40 MG PO TABS
40.0000 mg | ORAL_TABLET | Freq: Every day | ORAL | Status: DC
  Administered 2016-12-30: 40 mg
  Filled 2016-12-29 (×2): qty 1

## 2016-12-29 MED ORDER — PROPOFOL 1000 MG/100ML IV EMUL
INTRAVENOUS | Status: AC
  Filled 2016-12-29: qty 100

## 2016-12-29 MED ORDER — HEPARIN BOLUS VIA INFUSION
4000.0000 [IU] | Freq: Once | INTRAVENOUS | Status: AC
  Administered 2016-12-29: 4000 [IU] via INTRAVENOUS
  Filled 2016-12-29: qty 4000

## 2016-12-29 MED ORDER — CHLORHEXIDINE GLUCONATE 0.12% ORAL RINSE (MEDLINE KIT)
15.0000 mL | Freq: Two times a day (BID) | OROMUCOSAL | Status: DC
  Administered 2016-12-29 – 2016-12-31 (×4): 15 mL via OROMUCOSAL

## 2016-12-29 MED ORDER — ORAL CARE MOUTH RINSE
15.0000 mL | Freq: Four times a day (QID) | OROMUCOSAL | Status: DC
  Administered 2016-12-30 – 2016-12-31 (×8): 15 mL via OROMUCOSAL

## 2016-12-29 MED ORDER — INSULIN ASPART 100 UNIT/ML ~~LOC~~ SOLN: 0.0000 [IU] | Freq: Three times a day (TID) | SUBCUTANEOUS | Status: DC

## 2016-12-29 MED ORDER — HEPARIN (PORCINE) IN NACL 100-0.45 UNIT/ML-% IJ SOLN: 16.0000 [IU]/kg/h | INTRAMUSCULAR | Status: DC

## 2016-12-29 MED ORDER — ASPIRIN 325 MG PO TABS
325.0000 mg | ORAL_TABLET | Freq: Every day | ORAL | Status: DC
  Administered 2016-12-30: 325 mg
  Filled 2016-12-29 (×2): qty 1

## 2016-12-29 MED ORDER — CLOPIDOGREL BISULFATE 75 MG PO TABS: 75.0000 mg | ORAL_TABLET | Freq: Every day | ORAL | Status: DC

## 2016-12-29 MED ORDER — NITROGLYCERIN IN D5W 200-5 MCG/ML-% IV SOLN: 0.0000 ug/min | INTRAVENOUS | Status: DC

## 2016-12-29 MED ORDER — SODIUM CHLORIDE 0.9 % IV SOLN
250.0000 mL | INTRAVENOUS | Status: DC | PRN
  Administered 2016-12-30: 250 mL via INTRAVENOUS

## 2016-12-29 MED ORDER — PANTOPRAZOLE SODIUM 40 MG IV SOLR
40.0000 mg | INTRAVENOUS | Status: DC
  Administered 2016-12-30: 40 mg via INTRAVENOUS
  Filled 2016-12-29: qty 40

## 2016-12-29 MED ORDER — ASPIRIN 325 MG PO TABS: 325.0000 mg | ORAL_TABLET | Freq: Every day | ORAL | Status: DC

## 2016-12-29 NOTE — Progress Notes (Signed)
Chaplain responded to a code blue. Chaplain practiced the ministry of presence. Chaplain prayed with family and listened to the family as they prepared for death. Chaplain escorted daughter back to talk with Dr.   12/29/16 1800  Clinical Encounter Type  Visited With Family  Visit Type Critical Care  Referral From Care management  Spiritual Encounters  Spiritual Needs Emotional  Stress Factors  Family Stress Factors Health changes

## 2016-12-29 NOTE — ED Notes (Signed)
I Stat Lactic Acid, Chem 8 results shown to dr. Billy Fischer.

## 2016-12-29 NOTE — Progress Notes (Signed)
Pt. Entered v. Tach rhythm at 1733 then PEA 1735. Code blue initiated. ROSC returned at 1737. See code documentation. Pt. Stable. Family at bedside, updated by Pearline Cables MD. Will continue to closely monitor.

## 2016-12-29 NOTE — Progress Notes (Signed)
ANTICOAGULATION CONSULT NOTE - Initial Consult  Pharmacy Consult for Heparin Indication: chest pain/ACS  Allergies  Allergen Reactions  . Penicillins Swelling    Swelling of mouth Has patient had a PCN reaction causing immediate rash, facial/tongue/throat swelling, SOB or lightheadedness with hypotension: YES Has patient had a PCN reaction causing severe rash involving mucus membranes or skin necrosis: NO Has patient had a PCN reaction that required hospitalization NO Has patient had a PCN reaction occurring within the last 10 years: NO If all of the above answers are "NO", then may proceed with Cephalosporin use.    Patient Measurements: Height: 5\' 2"  (157.5 cm) Weight: 166 lb 5 oz (75.4 kg) IBW/kg (Calculated) : 50.1  Vital Signs: Temp: 97.1 F (36.2 C) (11/05 1150) Temp Source: Temporal (11/05 1150) BP: 230/128 (11/05 1150) Pulse Rate: 153 (11/05 1150)  Labs: Recent Labs    12/29/16 1200  HGB 16.0*  HCT 47.0*  CREATININE 1.20*    Estimated Creatinine Clearance: 38.5 mL/min (A) (by C-G formula based on SCr of 1.2 mg/dL (H)).   Medical History: Past Medical History:  Diagnosis Date  . Angina   . CHF (congestive heart failure) ([place])   . Complication of anesthesia    lung   . Diabetes mellitus ([place])   . GERD (gastroesophageal reflux disease)   . H/O hiatal hernia   . Hypercholesteremia   . Hypertension   . Myocardial infarction [facility])    CABG 2001, ? stent. Cath 02/2011 with new distal LIMA-LAD 80%. SVG-OM occlusion is old, rest of grafts patent  . Peripheral vascular disease ([place])   . Stroke [facility]) 2002 and 2003   Resultant right hemiparesis and aphasia. Communicates by writing     Medications:   (Not in a hospital admission)  Assessment: 78 YOF with post VF arrest. Pharmacy consulted to start IV heparin for ACS. H/H 16/47.   Goal of Therapy:  Heparin level 0.3-0.7 units/ml Monitor platelets by anticoagulation protocol: Yes   Plan:  -Heparin 4000  units IV bolus, then start heparin infusion at 1050 units/hr -F/u 8 hr HL -Monitor daily HL, CBC and s/s of bleeding  Albertina Parr, PharmD., BCPS Clinical Pharmacist Pager [phone]

## 2016-12-29 NOTE — Consult Note (Signed)
Reason for Consult: C. Arrest  Referring Physician: Gareth Morgan MD   [name] [name] is an 76 y.o. female.  HPI: Patient with known coronary artery disease and CABG in the remote past, history of multiple non-ST elevation myocardial infarction, last seen by me in May 2015 when she presented with acute systolic and diastolic heart failure and non-ST elevation myocardial infarction, she underwent diuresis and medical therapy was recommended in view of her underlying medical condition.  This morning she was going to the bank with her grandson, while sitting in the transportation, she suddenly passed out and EMS was called.  She was found to be in V. fib and was defibrillated x1 with [facility].  She remained unresponsive, was brought to the emergency room, EKG revealing profound ST depression globally and tachycardia and markedly elevated blood pressure, I was called for possibility of considering her to be going to the cardiac catheterization lab on emergent basis.  Patient unresponsive and unable to give history.  Past Medical History:  Diagnosis Date  . Angina   . CHF (congestive heart failure) ([place])   . Complication of anesthesia    lung   . Diabetes mellitus ([place])   . GERD (gastroesophageal reflux disease)   . H/O hiatal hernia   . Hypercholesteremia   . Hypertension   . Myocardial infarction [facility])    CABG 2001, ? stent. Cath 02/2011 with new distal LIMA-LAD 80%. SVG-OM occlusion is old, rest of grafts patent  . Peripheral vascular disease ([place])   . Stroke [facility]) 2002 and 2003   Resultant right hemiparesis and aphasia. Communicates by writing     Past Surgical History:  Procedure Laterality Date  . ABDOMINAL HYSTERECTOMY    . BREAST BIOPSY    . BREAST CYST EXCISION Left    years ago per daughter  . CARDIAC CATHETERIZATION  02/2011  . CORONARY ARTERY BYPASS GRAFT      Family History  Problem Relation Age of Onset  . Heart disease Mother   . Stroke Father   . Diabetes Sister   .  Diabetes Brother   . Anesthesia problems Neg Hx   . Hypotension Neg Hx   . Malignant hyperthermia Neg Hx   . Pseudochol deficiency Neg Hx     Social History:  reports that  has never smoked. she has never used smokeless tobacco. She reports that she does not drink alcohol or use drugs.  Allergies:  Allergies  Allergen Reactions  . Penicillins Swelling    Swelling of mouth Has patient had a PCN reaction causing immediate rash, facial/tongue/throat swelling, SOB or lightheadedness with hypotension: YES Has patient had a PCN reaction causing severe rash involving mucus membranes or skin necrosis: NO Has patient had a PCN reaction that required hospitalization NO Has patient had a PCN reaction occurring within the last 10 years: NO If all of the above answers are "NO", then may proceed with Cephalosporin use.    Medications: Medication list not updated as patient is in the emergency room and an acute basis and unresponsive.  Results for orders placed or performed during the hospital encounter of 12/29/16 (from the past 48 hour(s))  CBG monitoring, ED     Status: Abnormal   Collection Time: 12/29/16 11:54 AM  Result Value Ref Range   Glucose-Capillary 166 (H) 65 - 99 mg/dL   Comment 1 Notify RN    Comment 2 Document in Chart   I-stat troponin, ED     Status: None   Collection Time:  12/29/16 11:58 AM  Result Value Ref Range   Troponin i, poc 0.03 0.00 - 0.08 ng/mL   Comment 3            Comment: Due to the release kinetics of cTnI, a negative result within the first hours of the onset of symptoms does not rule out myocardial infarction with certainty. If myocardial infarction is still suspected, repeat the test at appropriate intervals.   I-stat chem 8, ed     Status: Abnormal   Collection Time: 12/29/16 12:00 PM  Result Value Ref Range   Sodium 141 135 - 145 mmol/L   Potassium 3.2 (L) 3.5 - 5.1 mmol/L   Chloride 101 101 - 111 mmol/L   BUN 17 6 - 20 mg/dL   Creatinine, Ser  1.20 (H) 0.44 - 1.00 mg/dL   Glucose, Bld 179 (H) 65 - 99 mg/dL   Calcium, Ion 1.18 1.15 - 1.40 mmol/L   TCO2 26 22 - 32 mmol/L   Hemoglobin 16.0 (H) 12.0 - 15.0 g/dL   HCT 47.0 (H) 36.0 - 46.0 %  I-Stat CG4 Lactic Acid, ED     Status: Abnormal   Collection Time: 12/29/16 12:00 PM  Result Value Ref Range   Lactic Acid, Venous 4.07 (HH) 0.5 - 1.9 mmol/L   Comment NOTIFIED PHYSICIAN     ROS: Unable to be obtained.  Height 5\' 2"  (1.575 m), weight 75.4 kg (166 lb 5 oz), SpO2 100 %.   Blood pressure (!) 230/128, pulse (!) 153, temperature (!) 97.1 F (36.2 C), temperature source Temporal, resp. rate (!) 27, height 5\' 2"  (1.575 m), weight 75.4 kg (166 lb 5 oz), SpO2 100 %.  Physical Exam  Constitutional:  Chronically ill looking, who is unresponsive.  HENT:  Head: Atraumatic.  Mouth/Throat: No oropharyngeal exudate.  Cardiovascular:  Tachycardia present, S1 and S2 normal, S4 gallop heard.  Respiratory: Effort normal and breath sounds normal.  GI: Soft. Bowel sounds are normal.  Musculoskeletal:  Chronically ischemic looking, no ulceration, thick skin, 2+ bilateral leg edema right worse than the left.  Neurological:  Unresponsive.   EKG 12/29/2016: Sinus tachycardia, left atrial abnormality, performed 3 mm global ST depression with 1 PVC and one ventricular couplet.  Compared to prior EKG in May 2018, profound ST depression globally is new, previously LVH with repolarization abnormality with T wave inversion in lateral leads.  Assessment/Plan: 1.  V. fib arrest 2.  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  History of stroke with dense right hemiplegia 4.  Hypertension 5.  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  Patient is unresponsive but will proceed with intubation for airway protection.  Would not recommend cardiac catheterization in view of multiple medical comorbidities although the event appears to be related to cardiac etiology and I personally reviewed the EKG on the field which reveals V. fib and conversion to sinus rhythm with shock.  I will continue to immerse with conversation with the family once they arrive.  I requested pulmonary critical care to assist me in management of medical issues and ventilation.  [name] Prows, MD, [facility] 12/29/2016, 12:30 PM    [name] Prows, MD 12/29/2016, 12:45 PM [facility] [place] Pager: [phone] Office: [phone] If no answer: Cell:  [phone]

## 2016-12-29 NOTE — Progress Notes (Signed)
Patient in ED Trauma A-Patient was intubated and sedated so she wouldn't have issues with breathing tube.  [name] [name]-grandchild was waiting in waiting area to get dr. report or chance to be with grandmother. Took patient back to be with grandmother.  Spent some time and had prayer with patient and [name] [name].  Patients eyes shed tears we noticed when prayer was over.  [name] [name] continued to speak and touch grandmothers hair and hold her hand to comfort her. I was paged to another call. [name] [name] is welcome to request chaplain come again if needed. Just page the chaplain on call.(Ask staff)  [name] [name], [place]   12/29/16 1500  Clinical Encounter Type  Visited With Patient;Patient and family together  Visit Type Initial;Spiritual support;ED  Referral From Chaplain  Consult/Referral To Chaplain  Spiritual Encounters  Spiritual Needs Prayer;Emotional  Stress Factors  Patient Stress Factors None identified

## 2016-12-29 NOTE — Progress Notes (Signed)
Notified MD where the family is located so that they can get an updated report.  Will remain available as needed.  Emotional support provided for the grandson.    12/29/16 1258  Clinical Encounter Type  Visited With Family;Patient;Health care provider  Visit Type Follow-up;Spiritual support

## 2016-12-29 NOTE — Progress Notes (Signed)
Cardiac arrest  Early after placement of a central line the patient again arrested.  She was overtly and torsades.  She was docked and converted to sinus rhythm with a relatively low blood pressure.  He came overtly tachycardic with the administration of a small dose of epinephrine and dopamine infusion therefore she has been none switched to Levophed.  Chest x-ray shows a well-placed line without pneumothorax and progressive congestive heart failure. EKG is again showing ST segment depression.  Her electrolytes  including a serum magnesium are pending, I have not empirically given her magnesium because of her marginal blood pressure.  Discussed my concern that recurrent arrhythmias are secondary to ischemia despite maximal medical therapy.  Daughter who holds power of attorney expresses her understanding however she wishes to continue aggressive supportive measures until other family members arrive.  Additional 32 minutes was spent in the care of this patient today

## 2016-12-29 NOTE — H&P (Signed)
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  She also has a recent diagnosis of breast cancer.  He was riding a bus with her grandson today when she suddenly became unresponsive.  When EMS arrived she was in V. fib and she was cardioverted with 1 shock.  EKG on arrival in the department of emergency medicine showed septal ST elevation and diffuse ST depression.  She is well-known to Dr. Einar Gip of the cardiology service who feels that medical care only is the appropriate option for this patient in light of her multiple medical comorbidities.  Unfortunately she received rocuronium just before my examination and I can make no comment on her neurological status nor can I obtain any history from the patient.  PAST MEDICAL HISTORY :  She  has a past medical history of Angina, CHF (congestive heart failure) ([place]), Complication of anesthesia, Diabetes mellitus ([place]), GERD (gastroesophageal reflux disease), H/O hiatal hernia, Hypercholesteremia, Hypertension, Myocardial infarction [facility]), Peripheral vascular disease ([place]), and Stroke ([place]) (2002 and 2003).  PAST SURGICAL HISTORY: She  has a past surgical history that includes Coronary artery bypass graft; Abdominal hysterectomy; Breast biopsy; Cardiac catheterization (02/2011); Breast cyst excision (Left); ESOPHAGOGASTRODUODENOSCOPY (EGD) (N/A, 03/12/2011); and LEFT HEART CATHETERIZATION WITH CORONARY ANGIOGRAM (N/A, 03/10/2011).  Allergies  Allergen Reactions  . Penicillins Swelling    Swelling of mouth Has patient had a PCN reaction causing immediate rash, facial/tongue/throat swelling, SOB or lightheadedness with hypotension: YES Has patient  had a PCN reaction causing severe rash involving mucus membranes or skin necrosis: NO Has patient had a PCN reaction that required hospitalization NO Has patient had a PCN reaction occurring within the last 10 years: NO If all of the above answers are "NO", then may proceed with Cephalosporin use.    No current facility-administered medications on file prior to encounter.    Current Outpatient Medications on File Prior to Encounter  Medication Sig  . anastrozole (ARIMIDEX) 1 MG tablet Take 1 tablet (1 mg total) by mouth daily.  . cholecalciferol (VITAMIN D) 1000 units tablet Take 1,000 Units by mouth daily.  . clopidogrel (PLAVIX) 75 MG tablet Take 75 mg by mouth daily.    Marland Kitchen escitalopram (LEXAPRO) 20 MG tablet Take 20 mg by mouth daily.  . furosemide (LASIX) 40 MG tablet Take 1 tablet (40 mg total) by mouth 2 (two) times daily.  Marland Kitchen gabapentin (NEURONTIN) 100 MG capsule Take 1 capsule (100 mg total) by mouth 3 (three) times daily.  . insulin aspart (NOVOLOG) 100 UNIT/ML injection Inject 10 Units into the skin 3 (three) times daily with meals.  . insulin glargine (LANTUS) 100 UNIT/ML injection Inject 0.2 mLs (20 Units total) into the skin daily.  Marland Kitchen lidocaine (XYLOCAINE) 5 % ointment Apply 1 application topically 2 (two) times daily as needed.  Marland Kitchen lisinopril (PRINIVIL,ZESTRIL) 5 MG tablet Take 1 tablet (5 mg total) by mouth daily.  . metoprolol tartrate (LOPRESSOR) 25 MG tablet Take 0.5 tablets (12.5 mg total) by mouth 2 (two) times daily. (Patient not taking: Reported on 07/12/2016)  . nitroGLYCERIN (NITROSTAT) 0.4 MG SL tablet Place 0.4 mg under the tongue every 5 (five) minutes as needed for  chest pain.  . potassium chloride SA (K-DUR,KLOR-CON) 20 MEQ tablet Take 1 tablet (20 mEq total) by mouth daily.  . rosuvastatin (CRESTOR) 40 MG tablet Take 40 mg by mouth daily.    FAMILY HISTORY:  Her indicated that her mother is deceased. She indicated that her father is deceased. She indicated that  her sister is alive. She indicated that her brother is alive. She indicated that the status of her neg hx is unknown.   SOCIAL HISTORY: She  reports that  has never smoked. she has never used smokeless tobacco. She reports that she does not drink alcohol or use drugs.  REVIEW OF SYSTEMS:   Not obtainable  SUBJECTIVE:  Not obtainable  VITAL SIGNS: BP (!) 230/128 (BP Location: Right Arm)   Pulse (!) 153   Temp (!) 97.1 F (36.2 C) (Temporal)   Resp (!) 27 Comment: patient was being b agged  Ht 5\' 2"  (1.575 m)   Wt 166 lb 5 oz (75.4 kg)   SpO2 100%   BMI 30.42 kg/m   HEMODYNAMICS:    VENTILATOR SETTINGS: Vent Mode: PRVC FiO2 (%):  [50 %-100 %] 50 % Set Rate:  [18 bmp-21 bmp] 21 bmp Vt Set:  [400 mL] 400 mL PEEP:  [5 cmH20] 5 cmH20 Plateau Pressure:  [17 cmH20] 17 cmH20  INTAKE / OUTPUT: No intake/output data recorded.  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 Cardiovascular: S1 and S2 are somewhat rapid and distant without murmur rub or gallop.  She has 2+ dependent edema the feet are warm.  JVD is present Lungs: There is symmetric air movement no wheezes and a normal I:E ratio. Abdomen: Flat without any overt organomegaly or masses LABS:  BMET Recent Labs  Lab 12/29/16 1200  NA 141  K 3.2*  CL 101  BUN 17  CREATININE 1.20*  GLUCOSE 179*    Electrolytes No results for input(s): CALCIUM, MG, PHOS in the last 168 hours.  CBC Recent Labs  Lab 12/29/16 1200  HGB 16.0*  HCT 47.0*    Coag's No results for input(s): APTT, INR in the last 168 hours.  Sepsis Markers Recent Labs  Lab 12/29/16 1200  LATICACIDVEN 4.07*    ABG Recent Labs  Lab 12/29/16 1300  PHART 7.295*  PCO2ART 56.3*  PO2ART 234.0*    Liver Enzymes No results for input(s): AST, ALT, ALKPHOS, BILITOT, ALBUMIN in the last 168 hours.  Cardiac Enzymes No  results for input(s): TROPONINI, PROBNP in the last 168 hours.  Glucose Recent Labs  Lab 12/29/16 1154  GLUCAP 166*    Imaging Ct Head Wo Contrast  Result Date: 12/29/2016 CLINICAL DATA:  76 year old female became unresponsive on the bus. V fib cardiac arrest and subsequent resuscitation. Remains unresponsive. EXAM: CT HEAD WITHOUT CONTRAST TECHNIQUE: Contiguous axial images were obtained from the base of the skull through the vertex without intravenous contrast. COMPARISON:  Head CTs 05/04/2012 and earlier. FINDINGS: Brain: Chronic hypodensity in the bilateral corona radiata, basal ganglia and deep white matter capsules compatible with chronic small vessel ischemia. Heterogeneity in the thalami appears mildly progressed since prior studies. Similar cerebral volume overall. Additional patchy bilateral cerebral white matter hypodensity. Small chronic lacunar infarct in the inferior right cerebellum is stable. No midline shift, ventriculomegaly, mass effect, evidence of mass lesion, intracranial hemorrhage or evidence of cortically based acute infarction. Vascular: Extensive Calcified atherosclerosis at the skull base. No suspicious intracranial vascular hyperdensity.  Frequent dural calcifications. Skull: Stable.  No acute osseous abnormality identified. Sinuses/Orbits: The patient appears intubated on the scout view. Opacified posterior nasal cavity and visible nasopharynx. Mild mucosal thickening in the posterior ethmoids and maxillary sinuses. Superimposed small right maxillary mucous retention cyst which appears stable since 2013. Tympanic cavities and mastoids remain clear. Other: No acute orbit or scalp soft tissue findings. IMPRESSION: 1.  No acute intracranial abnormality identified. 2. Advanced chronic small vessel disease with mild progression suspected in the deep Jahree Dermody matter since 2014. 3. Intubation related fluid in the pharynx. Electronically Signed   By: Genevie Ann M.D.   On: 12/29/2016 12:56    Dg Chest Portable 1 View  Result Date: 12/29/2016 CLINICAL DATA:  Intubated patient EXAM: PORTABLE CHEST 1 VIEW COMPARISON:  Portable chest x-ray of Jul 13, 2016 FINDINGS: The lungs are borderline hypoinflated. The endotracheal tube tip lies approximately 3.8 cm above the carina there is no alveolar infiltrate or pleural effusion. The heart is top-normal in size. The pulmonary vascularity is normal. External pacemaker defibrillator pads are present. The patient has undergone previous CABG. The esophagogastric tube tip in proximal port lie in the distal esophagus. There is mild gaseous distention of the stomach. The bony thorax exhibits no acute abnormality. IMPRESSION: Advancement of the nasogastric tube by approximately 20 cm is needed to assure the proximal port and the tip lie below the GE junction. The stomach is mildly distended with gas. The endotracheal tube tip is in reasonable position. Mild cardiomegaly.  No pulmonary vascular congestion or pneumonia. Electronically Signed   By: David  Martinique M.D.   On: 12/29/2016 12:36     STUDIES:  My eye the endotracheal tube is well placed.  There is mild CHF present.  ET scan of the head to my eye does not show any acute event.  There is no blood   DISCUSSION: This is a 76 year old diabetic with a history of prior CABG and persistent hemic chest pain suffered an out of hospital V. fib arrest earlier today.  I am to resuscitation is not known.  She received 1 shock in the field and converted to sinus rhythm.  Initial EKG showed ST elevation in V1 and diffuse ST segment depression.  She was seen by Dr. Einar Gip of interventional cardiology who knows the patient well and tells me that in light of her multiple medical comorbidities he feels it would be inappropriate to attempt an acute intervention.   ASSESSMENT / PLAN:  PULMONARY A: Obviously remains intubated and mechanically ventilated for airway protection.  She has a moderate respiratory acidosis on  her initial blood gas and we have made ventilator adjustments to correct for that.     CARDIOVASCULAR A: That is post VF arrest.  Going to treat this is if this was provoked by coronary artery disease, the extreme hypertension possible dissection with dissection of a coronary might also be a consideration however she is not a candidate for PCI than she is certainly not a candidate for surgical intervention for dissection.  To treat her with a combination of beta-blocker, aspirin Plavix, heparin, nitroglycerin infusion to keep her blood pressure in the 110-120 range.  She is still pharmacologically paralyzed and I cannot make a comment on her neurological status at this point.  If she is interactive when the rocuronium is metabolized, I will not cool the patient,  otherwise I will be targeting a temperature of 36 degrees.  GASTROINTESTINAL A: Prophylaxis with Protonix has been ordered  ENDOCRINE A:  Sliding scale insulin has been ordered for her type II diabetes   I am awaiting the arrival of the phlegm family to discuss the appropriate level of care Greater than 35 minutes has already been spent in the care of this patient today  Lars Masson, [place] Pager: [phone]  12/29/2016, 1:16 PM

## 2016-12-29 NOTE — ED Notes (Addendum)
Pt's CBG result was 166. Informed Dr. Billy Fischer & Claiborne Billings - RN.

## 2016-12-29 NOTE — Progress Notes (Signed)
   12/29/16 1200  Clinical Encounter Type  Visited With Family;Health care provider  Visit Type ED  Referral From Nurse  Consult/Referral To Chaplain  Spiritual Encounters  Spiritual Needs Emotional  Stress Factors  Family Stress Factors Major life changes   Page for Code Stemi.  Medical team working with patient.  Supported the grandson and made sure the medical team connected with grandson and called the aunt and mother.  Supported the grandson until other [place] arrived.  Will follow as needed. Chaplain Katherene Ponto

## 2016-12-29 NOTE — Code Documentation (Signed)
CODE BLUE NOTE  Patient Name: [name] [name]   MRN: [id number]   Date of Birth/ Sex: [birth year] , female      Admission Date: 12/29/2016  Attending Provider: Sampson Goon, MD  Primary Diagnosis: <principal problem not specified>    Indication: Pt was in her usual state of health until this PM, when she was noted to be VTach HR 200 BP 125/71. Pt was already intubated. Code blue was subsequently called. At the time of arrival on scene, ACLS protocol was underway.    Technical Description:  - CPR performance duration:  4 minute  - Was defibrillation or cardioversion used? No   - Was external pacer placed? No  - Was patient intubated pre/post CPR? No    Medications Administered: Y = Yes; Blank = No Amiodarone    Atropine    Calcium    Epinephrine  Y  Lidocaine    Magnesium    Norepinephrine    Phenylephrine    Sodium bicarbonate    Vasopressin    Dopomine: Y  Post CPR evaluation:  - Final Status - Was patient successfully resuscitated ? Yes - What is current rhythm? Sinus, nl rate  - What is current hemodynamic status? 120/60   Miscellaneous Information:  - Labs sent, including: ABG, CMP, CXR, Mg  - Primary team notified?  Yes  - Family Notified? Yes  - Additional notes/ transfer status: Right Fem line,         Bonnita Hollow, MD  12/29/2016, 5:36 PM

## 2016-12-29 NOTE — ED Provider Notes (Signed)
[facility] Provider Note   CSN: [id number] Arrival date & time: 12/29/16  1146     History   Chief Complaint Chief Complaint  Patient presents with  . Cardiac Arrest    HPI [name] [name] is a 76 y.o. female.  HPI  76 year old female with a history of diabetes, hypertension, hyperlipidemia, coronary artery disease, CHF, peripheral vascular disease, CVA with right hemiparesis, presents following cardiac arrest.  EMS reports they were called out to the [facility] bus for unresponsiveness.  When they arrived, she initially had carotid pulse, however when the transferred her off of the past and hooked her up to the monitor they found her in V. fib arrest.  They defibrillated her with immediate return of spontaneous circulation, report minimal down time of 5 minutes or less.  She was breathing spontaneously, tachycardic with a heart rate in the 150s, and with blood pressures of 762 systolic.  They grandson lives with her in the past, did not reports her having any chest pain prior to this episode.  History is limited by patient's altered mental status.  Past Medical History:  Diagnosis Date  . Angina   . CHF (congestive heart failure) ([place])   . Complication of anesthesia    lung   . Diabetes mellitus ([place])   . GERD (gastroesophageal reflux disease)   . H/O hiatal hernia   . Hypercholesteremia   . Hypertension   . Myocardial infarction [facility])    CABG 2001, ? stent. Cath 02/2011 with new distal LIMA-LAD 80%. SVG-OM occlusion is old, rest of grafts patent  . Peripheral vascular disease ([place])   . Stroke [facility]) 2002 and 2003   Resultant right hemiparesis and aphasia. Communicates by writing     Patient Active Problem List   Diagnosis Date Noted  . Cardiac arrest ([place]) 12/29/2016  . Malnutrition of moderate degree 07/15/2016  . Diastolic CHF ([place]) 83/15/1761  . Ankle ulcer, right, limited to breakdown of skin ([place]) 07/12/2016  . Malignant neoplasm of  lower-outer quadrant of right breast of female, estrogen receptor positive ([place]) 05/28/2016  . Chronic combined systolic and diastolic CHF (congestive heart failure) ([place]) 12/01/2015  . Acute encephalopathy 12/01/2015  . Hypoglycemia 12/01/2015  . Elevated troponin 12/01/2015  . Diabetes mellitus without complication ([place])   . Hypothermia   . Rhonchi   . Acute respiratory failure ([place]) 01/28/2014  . Respiratory distress 01/27/2014  . Hypoxia 01/27/2014  . CHF exacerbation ([place]) 01/27/2014  . Acute on chronic systolic CHF (congestive heart failure) ([place]) 01/27/2014  . Systolic CHF, acute on chronic ([place]) 01/27/2014  . Increased anion gap metabolic acidosis 60/73/7106  . Right foot ulcer ([place]) 01/27/2014  . Aspiration pneumonia ([place]) 06/23/2012  . Dysphagia 06/21/2012  . Palliative care encounter 06/20/2012  . Healthcare-associated pneumonia 06/16/2012  . TIA (transient ischemic attack) 05/05/2012  . N&V (nausea and vomiting) 05/04/2012  . Weakness generalized 05/04/2012  . 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 NSTEMI (non-ST elevated myocardial infarction) ([place]) 07/27/2011  . Diabetes mellitus ([place])   . Chest pain 03/10/2011  . HTN (hypertension) 03/10/2011  . Cerebral infarction ([place]) 03/10/2011  . CAD (coronary artery disease), native coronary artery 03/10/2011  . 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 S/P PTCA (percutaneous transluminal coronary angioplasty) 03/10/2011    Past Surgical History:  Procedure Laterality Date  . ABDOMINAL HYSTERECTOMY    . BREAST BIOPSY    . BREAST CYST EXCISION  Left    years ago per daughter  . CARDIAC CATHETERIZATION  02/2011  . CORONARY ARTERY BYPASS GRAFT      OB History    No data available       Home Medications    Prior to Admission medications   Medication Sig Start Date End Date Taking? Authorizing Provider  anastrozole (ARIMIDEX) 1 MG tablet Take 1 tablet (1 mg total)  by mouth daily. 05/28/16  Yes Magrinat, Virgie Dad, MD  cholecalciferol (VITAMIN D) 1000 units tablet Take 1,000 Units by mouth daily.   Yes [provider]  clopidogrel (PLAVIX) 75 MG tablet Take 75 mg by mouth daily.     Yes [provider]  escitalopram (LEXAPRO) 20 MG tablet Take 20 mg by mouth daily.   Yes [provider]  furosemide (LASIX) 40 MG tablet Take 1 tablet (40 mg total) by mouth 2 (two) times daily. 07/17/16  Yes Verlee Monte, MD  gabapentin (NEURONTIN) 100 MG capsule Take 1 capsule (100 mg total) by mouth 3 (three) times daily. 01/28/14  Yes Rai, Ripudeep K, MD  insulin aspart (NOVOLOG) 100 UNIT/ML injection Inject 10 Units into the skin 3 (three) times daily with meals. 07/17/16  Yes Verlee Monte, MD  insulin glargine (LANTUS) 100 UNIT/ML injection Inject 0.2 mLs (20 Units total) into the skin daily. Patient taking differently: Inject 20 Units at bedtime into the skin.  07/17/16  Yes Verlee Monte, MD  lidocaine (XYLOCAINE) 5 % ointment Apply 1 application topically 2 (two) times daily as needed. 06/24/16  Yes Stover, Titorya, DPM  lisinopril (PRINIVIL,ZESTRIL) 5 MG tablet Take 1 tablet (5 mg total) by mouth daily. 12/04/15  Yes Elgergawy, Silver Huguenin, MD  nitroGLYCERIN (NITROSTAT) 0.4 MG SL tablet Place 0.4 mg under the tongue every 5 (five) minutes as needed for chest pain.   Yes [provider]  potassium chloride SA (K-DUR,KLOR-CON) 20 MEQ tablet Take 1 tablet (20 mEq total) by mouth daily. 07/20/13  Yes [name] Prows, MD  rosuvastatin (CRESTOR) 40 MG tablet Take 40 mg by mouth daily.   Yes [provider]  metoprolol tartrate (LOPRESSOR) 25 MG tablet Take 0.5 tablets (12.5 mg total) by mouth 2 (two) times daily. Patient not taking: Reported on 12/29/2016 12/04/15   Elgergawy, Silver Huguenin, MD    Family History Family History  Problem Relation Age of Onset  . Heart disease Mother   . Stroke Father   . Diabetes Sister   . Diabetes Brother   .  Anesthesia problems Neg Hx   . Hypotension Neg Hx   . Malignant hyperthermia Neg Hx   . Pseudochol deficiency Neg Hx     Social History Social History   Tobacco Use  . Smoking status: Never Smoker  . Smokeless tobacco: Never Used  Substance Use Topics  . Alcohol use: No  . Drug use: No     Allergies   Penicillins   Review of Systems Review of Systems  Unable to perform ROS: Mental status change     Physical Exam Updated Vital Signs BP (!) 76/49   Pulse (!) 33   Temp 98.6 F (37 C) (Oral)   Resp (!) 31   Ht _0  (1.575 m)   Wt 75.4 kg (166 lb 5 oz)   SpO2 92%   BMI 30.42 kg/m   Physical Exam  Constitutional: She appears well-developed and well-nourished. She appears lethargic. She has a sickly appearance. She appears ill.  HENT:  Head: Normocephalic and atraumatic.  Mouth/Throat: No oropharyngeal exudate.  Eyes: Conjunctivae and EOM are normal. Pupils are equal, round, and reactive to light.  Neck: Normal range of motion.  Cardiovascular: Regular rhythm, normal heart sounds and intact distal pulses. Tachycardia present. Exam reveals no gallop and no friction rub.  No murmur heard. Pulmonary/Chest: No respiratory distress. She has no wheezes. She has rales.  Deep sonorous respirations  Abdominal: Soft. She exhibits no distension. There is no tenderness. There is no guarding.  Musculoskeletal: She exhibits no edema or tenderness.  Neurological: She appears lethargic. GCS eye subscore is 2. GCS verbal subscore is 1. GCS motor subscore is 4.  Moves right upper and lower extremity to pain Lifts head with sternal rub No left lower extremity movement, left upper extremity withdrawing to pain/IV stick  Skin: Skin is warm and dry. No rash noted. She is not diaphoretic. No erythema.  Nursing note and vitals reviewed.    ED Treatments / Results  Labs (all labs ordered are listed, but only abnormal results are displayed) Labs Reviewed  TROPONIN I - Abnormal;  Notable for the following components:      Result Value   Troponin I 0.05 (*)    All other components within normal limits  HEMOGLOBIN A1C - Abnormal; Notable for the following components:   Hgb A1c MFr Bld 9.4 (*)    All other components within normal limits  COMPREHENSIVE METABOLIC PANEL - Abnormal; Notable for the following components:   Potassium 3.2 (*)    Glucose, Bld 241 (*)    Creatinine, Ser 1.41 (*)    Calcium 8.4 (*)    Albumin 3.3 (*)    AST 265 (*)    ALT 158 (*)    GFR calc non Af Amer 35 (*)    GFR calc Af Amer 41 (*)    All other components within normal limits  GLUCOSE, CAPILLARY - Abnormal; Notable for the following components:   Glucose-Capillary 168 (*)    All other components within normal limits  CBG MONITORING, ED - Abnormal; Notable for the following components:   Glucose-Capillary 166 (*)    All other components within normal limits  I-STAT CHEM 8, ED - Abnormal; Notable for the following components:   Potassium 3.2 (*)    Creatinine, Ser 1.20 (*)    Glucose, Bld 179 (*)    Hemoglobin 16.0 (*)    HCT 47.0 (*)    All other components within normal limits  I-STAT CG4 LACTIC ACID, ED - Abnormal; Notable for the following components:   Lactic Acid, Venous 4.07 (*)    All other components within normal limits  I-STAT ARTERIAL BLOOD GAS, ED - Abnormal; Notable for the following components:   pH, Arterial 7.295 (*)    pCO2 arterial 56.3 (*)    pO2, Arterial 234.0 (*)    All other components within normal limits  POCT I-STAT 3, ART BLOOD GAS (G3+) - Abnormal; Notable for the following components:   pH, Arterial 7.334 (*)    pO2, Arterial 53.0 (*)    Acid-base deficit 3.0 (*)    All other components within normal limits  MRSA PCR SCREENING  TROPONIN I  GLUCOSE, CAPILLARY  MAGNESIUM  TRIGLYCERIDES  PHOSPHORUS  TROPONIN I  HEPARIN LEVEL (UNFRACTIONATED)  BLOOD GAS, ARTERIAL  CBC  LIPID PANEL  BLOOD GAS, ARTERIAL  LACTIC ACID, PLASMA  I-STAT  TROPONIN, ED    EKG  EKG Interpretation  Date/Time:  Monday December 29 2016 11:48:41 EST Ventricular Rate:  143 PR Interval:    QRS Duration: 106 QT Interval:  338 QTC Calculation: 522 R Axis:   56 Text Interpretation:  Sinus tachycardia Probable left atrial enlargement Repolarization abnormality, ST depressions anterior, lateral, inferior leads Since prior ECG, ST depressions new in some leads, more prominent in others, rate has increased Confirmed by Gareth Morgan [phone]) on 12/29/2016 7:58:56 PM       Radiology Ct Head Wo Contrast  Result Date: 12/29/2016 CLINICAL DATA:  76 year old female became unresponsive on the bus. V fib cardiac arrest and subsequent resuscitation. Remains unresponsive. EXAM: CT HEAD WITHOUT CONTRAST TECHNIQUE: Contiguous axial images were obtained from the base of the skull through the vertex without intravenous contrast. COMPARISON:  Head CTs 05/04/2012 and earlier. FINDINGS: Brain: Chronic hypodensity in the bilateral corona radiata, basal ganglia and deep white matter capsules compatible with chronic small vessel ischemia. Heterogeneity in the thalami appears mildly progressed since prior studies. Similar cerebral volume overall. Additional patchy bilateral cerebral white matter hypodensity. Small chronic lacunar infarct in the inferior right cerebellum is stable. No midline shift, ventriculomegaly, mass effect, evidence of mass lesion, intracranial hemorrhage or evidence of cortically based acute infarction. Vascular: Extensive Calcified atherosclerosis at the skull base. No suspicious intracranial vascular hyperdensity. Frequent dural calcifications. Skull: Stable.  No acute osseous abnormality identified. Sinuses/Orbits: The patient appears intubated on the scout view. Opacified posterior nasal cavity and visible nasopharynx. Mild mucosal thickening in the posterior ethmoids and maxillary sinuses. Superimposed small right maxillary mucous retention cyst which  appears stable since 2013. Tympanic cavities and mastoids remain clear. Other: No acute orbit or scalp soft tissue findings. IMPRESSION: 1.  No acute intracranial abnormality identified. 2. Advanced chronic small vessel disease with mild progression suspected in the deep gray matter since 2014. 3. Intubation related fluid in the pharynx. Electronically Signed   By: Genevie Ann M.D.   On: 12/29/2016 12:56   Dg Chest Port 1 View  Result Date: 12/29/2016 CLINICAL DATA:  Cardiac arrest, status post CPR EXAM: PORTABLE CHEST 1 VIEW COMPARISON:  Chest radiograph 12/29/2016 FINDINGS: Endotracheal tube tip is approximately 2.8 cm above the inferior carina. Nasogastric tube has been advanced on the side port is now just below the diaphragm. There is mild cardiomegaly and mild interstitial pulmonary edema. No large pleural effusion. IMPRESSION: 1. Endotracheal tube tip approximately 2.8 cm above the inferior margin of the carina. 2. Nasogastric tube side port just below the diaphragm, at approximately the level of the GE junction. Advancing by 5-7 cm when sure that the side port is within the stomach. 3. Mild cardiomegaly and interstitial pulmonary edema. Electronically Signed   By: Ulyses Jarred M.D.   On: 12/29/2016 18:18   Dg Chest Portable 1 View  Result Date: 12/29/2016 CLINICAL DATA:  Intubated patient EXAM: PORTABLE CHEST 1 VIEW COMPARISON:  Portable chest x-ray of Jul 13, 2016 FINDINGS: The lungs are borderline hypoinflated. The endotracheal tube tip lies approximately 3.8 cm above the carina there is no alveolar infiltrate or pleural effusion. The heart is top-normal in size. The pulmonary vascularity is normal. External pacemaker defibrillator pads are present. The patient has undergone previous CABG. The esophagogastric tube tip in proximal port lie in the distal esophagus. There is mild gaseous distention of the stomach. The bony thorax exhibits no acute abnormality. IMPRESSION: Advancement of the nasogastric  tube by approximately 20 cm is needed to assure the proximal port and the tip lie below the GE junction. The stomach is mildly distended with gas. The endotracheal  tube tip is in reasonable position. Mild cardiomegaly.  No pulmonary vascular congestion or pneumonia. Electronically Signed   By: David  Martinique M.D.   On: 12/29/2016 12:36    Procedures Procedure Name: Intubation Date/Time: 12/29/2016 7:45 PM Performed by: Gareth Morgan, MD Pre-anesthesia Checklist: Patient identified, Emergency Drugs available, Timeout performed, Patient being monitored and Suction available Oxygen Delivery Method: Ambu bag Preoxygenation: Pre-oxygenation with 100% oxygen Induction Type: IV induction and Rapid sequence Ventilation: Mask ventilation without difficulty Laryngoscope Size: Glidescope Grade View: Grade I Tube size: 7.5 mm Number of attempts: 1 Placement Confirmation: ETT inserted through vocal cords under direct vision Secured at: 23 cm Tube secured with: ETT holder      (including critical care time)  Medications Ordered in ED Medications  propofol (DIPRIVAN) 1000 MG/100ML infusion (  Not Given 12/29/16 1541)  metoprolol tartrate (LOPRESSOR) injection 2.5 mg (not administered)  nitroGLYCERIN 50 mg in dextrose 5 % 250 mL (0.2 mg/mL) infusion (not administered)  0.9 %  sodium chloride infusion (not administered)  pantoprazole (PROTONIX) injection 40 mg (not administered)  insulin aspart (novoLOG) injection 0-9 Units (0 Units Subcutaneous Not Given 12/29/16 1605)  heparin ADULT infusion 100 units/mL (25000 units/260m sodium chloride 0.45%) (1,050 Units/hr Intravenous New Bag/Given 12/29/16 1953)  clopidogrel (PLAVIX) tablet 75 mg (not administered)  aspirin tablet 325 mg (not administered)  atorvastatin (LIPITOR) tablet 40 mg (not administered)  propofol (DIPRIVAN) 1000 MG/100ML infusion (0 mcg/kg/min  75.4 kg Intravenous Stopped 12/29/16 1715)  chlorhexidine gluconate (MEDLINE KIT)  (PERIDEX) 0.12 % solution 15 mL (not administered)  MEDLINE mouth rinse (not administered)  heparin bolus via infusion 4,000 Units (4,000 Units Intravenous Bolus from Bag 12/29/16 1953)   CRITICAL CARE Performed by: ETennis Must  Total critical care time: 30 minutes  Critical care time was exclusive of separately billable procedures and treating other patients.  Critical care was necessary to treat or prevent imminent or life-threatening deterioration.  Critical care was time spent personally by me on the following activities: development of treatment plan with patient and/or surrogate as well as nursing, discussions with consultants, evaluation of patient's response to treatment, examination of patient, obtaining history from patient or surrogate, ordering and performing treatments and interventions, ordering and review of laboratory studies, ordering and review of radiographic studies, pulse oximetry and re-evaluation of patient's condition.   Initial Impression / Assessment and Plan / ED Course  I have reviewed the triage vital signs and the nursing notes.  Pertinent labs & imaging results that were available during my care of the patient were reviewed by me and considered in my medical decision making (see chart for details).     76year old female with a history of diabetes, hypertension, hyperlipidemia, coronary artery disease, CHF, peripheral vascular disease, CVA with right hemiparesis, breast cancer, presents following cardiac arrest.  EMS reports they were called out to the [facility] bus for unresponsiveness.  When they arrived, she initially had carotid pulse, however when the transferred her off of the past and hooked her up to the monitor they found her in V. fib arrest.  They defibrillated her with immediate return of spontaneous circulation, report minimal down time of 5 minutes or less.   On arrival, patient tachycardic, breathing spontaneously, with blood pressure of 140/85,  is withdrawing to pain with a GCS between 6 and 7.  Due to her responsiveness, she is not a candidate for cooling. EKG in ED is not clearly a STEMI but shows ischemic changes and given VFib  arrest, called a code STEMI. Her cardiologist, Dr. Einar Gip, came to bedside for evaluation.  Patient with DNR status at times, confirmed with grandson at this time patient will be full code and will proceed with intubation for airway protection and expected course of care.   Dr .Einar Gip knows her well, evaluated her and spoke with family.  Given her multiple comorbidities, does not feel she is an appropriate candidate for catheterization and recommends medical management and ICU admission.   Plan to continue care and full code until other family arrives.   Given aspirin, amiodarone 126m. Head CT completed without acute abnormalities and heparin given. Admitted to critical care.   Final Clinical Impressions(s) / ED Diagnoses   Final diagnoses:    Cardiac arrest ([facility]  Cardiac arrest with ventricular fibrillation ([facility]  Cardiac ischemia    ED Discharge Orders    None       SGareth Morgan MD 12/29/16 2000

## 2016-12-29 NOTE — ED Notes (Signed)
Patient presents to ed via [facility] grandson states  They were on the [facility]  bus and was going to the bank. Patient is normally wheelchair bound , grandson states patient was fine before going on [facility] bus, he states she was sitting in her wheelchair she became stiff and then went unresponsive. EMS arrived very shortly after patient went unresp , upon their arrival patient was in v- fib, she was shocked x 1 and returned to brady rate 40 with strong carotids.  Upon arrival to ed patient was being bagged heart rate was in ST rate 153. Dr. Billy Fischer at bedside upon arrival. Resp at bedside.  1206pm Dr, Einar Gip at bedside  1210pm intubated by Dr. Billy Fischer #7.5 23 @the  lip Bilateral breath sounds , positive color change, prob. Chest xray done,  1222p OG inserted #16 placement confirmed by ausculation  And xray.  Propofol drip strated at 33mcg/4.5 ml. 1247p Returned from CT Scan Dr. Pearline Cables at bedside.

## 2016-12-29 NOTE — ED Notes (Signed)
[place] Dr Pearline Cables aware of b/p 81/43 p-77, ordered 500cc ns bolus.

## 2016-12-29 NOTE — ED Notes (Signed)
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

## 2016-12-29 NOTE — Progress Notes (Signed)
eLink Physician-Brief Progress Note Patient Name: [name] [name] DOB: June 11, 1940 MRN: [id number]   Date of Service  12/29/2016  HPI/Events of Note  Hypotension - Presently on Norepinephrine IV infusion for hemodynamic support. No order for Norepinephrine IV infusion. Patient has a CVL.   eICU Interventions  Will order Norepinephrine IV infusion. Titrate to MAP >= 65.     Intervention Category Major Interventions: Hypotension - evaluation and management  Yossi Hinchman [place] 12/29/2016, 8:15 PM

## 2016-12-29 NOTE — Progress Notes (Signed)
Heparin held due to central line placement. AM RN was told to hold until further notice.Questions on whether Heparin should be started now. Dr. Oletta Darter and Dr. Pearline Cables notified. Dr. Pearline Cables instructed to start Heparin as ordered. Dr. Pearline Cables at bedside with family discussing current code status. Patient will remain full code. Will continue to closely monitor.

## 2016-12-29 NOTE — Procedures (Signed)
Central line placement  Central line was placed for the administration of pressors in a patient with very unstable hemodynamics.  The procedure was explained to family and permission obtained.  A timeout was performed in the area over the left subclavian was extensively prepped with chlorhexidine.  The area was widely draped in sterile garb was donned.  The vein was easily cannulated and a wire gently placed.  The tract was dilated and a 7 French triple-lumen catheter was placed to 16 cm.  There was good flow from all ports.  The catheter was sutured in place.  Several minutes after line placement the patient suffered from a torsades arrest.  Chest x-ray shows good line placement the line is not in the atrium there is no pneumothorax and there is progressive congestive heart failure

## 2016-12-30 ENCOUNTER — Inpatient Hospital Stay (HOSPITAL_COMMUNITY): Payer: Medicare HMO

## 2016-12-30 ENCOUNTER — Ambulatory Visit: Payer: Medicare HMO | Admitting: Sports Medicine

## 2016-12-30 LAB — GLUCOSE, CAPILLARY
GLUCOSE-CAPILLARY: 254 mg/dL — AB (ref 65–99)
GLUCOSE-CAPILLARY: 224 mg/dL — AB (ref 65–99)
GLUCOSE-CAPILLARY: 195 mg/dL — AB (ref 65–99)
GLUCOSE-CAPILLARY: 217 mg/dL — AB (ref 65–99)
GLUCOSE-CAPILLARY: 165 mg/dL — AB (ref 65–99)
Glucose-Capillary: 166 mg/dL — ABNORMAL HIGH (ref 65–99)

## 2016-12-30 LAB — BLOOD GAS, ARTERIAL
Acid-base deficit: 0 mmol/L (ref 0.0–2.0)
BICARBONATE: 24.3 mmol/L (ref 20.0–28.0)
Drawn by: 34560
FIO2: 100
LHR: 21 {breaths}/min
O2 SAT: 99.7 %
PATIENT TEMPERATURE: 98.6
PCO2 ART: 41.2 mmHg (ref 32.0–48.0)
PEEP: 5 cmH2O
PH ART: 7.389 (ref 7.350–7.450)
VT: 400 mL
pO2, Arterial: 394 mmHg — ABNORMAL HIGH (ref 83.0–108.0)

## 2016-12-30 LAB — CBC
HCT: 39.3 % (ref 36.0–46.0)
HEMOGLOBIN: 12.3 g/dL (ref 12.0–15.0)
MCH: 26.6 pg (ref 26.0–34.0)
MCHC: 31.3 g/dL (ref 30.0–36.0)
MCV: 85.1 fL (ref 78.0–100.0)
Platelets: 174 10*3/uL (ref 150–400)
RBC: 4.62 MIL/uL (ref 3.87–5.11)
RDW: 15.4 % (ref 11.5–15.5)
WBC: 12.5 10*3/uL — ABNORMAL HIGH (ref 4.0–10.5)

## 2016-12-30 LAB — TROPONIN I: Troponin I: 1.11 ng/mL (ref <0.03)

## 2016-12-30 LAB — ECHOCARDIOGRAM COMPLETE
AO mean calculated velocity dopler: 190 cm/s
AOPV: 0.43 m/s
AOVTI: 59.9 cm
AV Area VTI index: 0.61 cm2/m2
AV Area mean vel: 1.14 cm2
AV Mean grad: 17 mmHg
AV Peak grad: 37 mmHg
AV pk vel: 303 cm/s
AV vel: 1.1
AVA: 1.1 cm2
AVAREAMEANVIN: 0.63 cm2/m2
AVAREAVTI: 1.21 cm2
AVCELMEANRAT: 0.4
CHL CUP AV PEAK INDEX: 0.67
CHL CUP AV VALUE AREA INDEX: 0.61
CHL CUP DOP CALC LVOT VTI: 23.3 cm
CHL CUP MV DEC (S): 327
E decel time: 327 msec
FS: 17 % — AB (ref 28–44)
Height: 62 in
IV/PV OW: 0.67
LA vol: 64 mL
LADIAMINDEX: 2.2 cm/m2
LASIZE: 40 mm
LAVOLA4C: 58.6 mL
LAVOLIN: 35.2 mL/m2
LDCA: 2.84 cm2
LEFT ATRIUM END SYS DIAM: 40 mm
LV PW d: 12.9 mm — AB (ref 0.6–1.1)
LVOT SV: 66 mL
LVOT peak grad rest: 7 mmHg
LVOT peak vel: 129 cm/s
LVOTD: 19 mm
LVOTVTI: 0.39 cm
MV pk E vel: 51.9 m/s
MVPKAVEL: 103 m/s
TAPSE: 14.3 mm
WEIGHTICAEL: 2585.55 [oz_av]

## 2016-12-30 LAB — LACTIC ACID, PLASMA
Lactic Acid, Venous: 1 mmol/L (ref 0.5–1.9)
Lactic Acid, Venous: 1 mmol/L (ref 0.5–1.9)

## 2016-12-30 LAB — BASIC METABOLIC PANEL
ANION GAP: 6 (ref 5–15)
Anion gap: 10 (ref 5–15)
BUN: 20 mg/dL (ref 6–20)
BUN: 17 mg/dL (ref 6–20)
CHLORIDE: 105 mmol/L (ref 101–111)
CO2: 23 mmol/L (ref 22–32)
CO2: 26 mmol/L (ref 22–32)
CREATININE: 1.2 mg/dL — AB (ref 0.44–1.00)
Calcium: 8.6 mg/dL — ABNORMAL LOW (ref 8.9–10.3)
Calcium: 8.7 mg/dL — ABNORMAL LOW (ref 8.9–10.3)
Chloride: 105 mmol/L (ref 101–111)
Creatinine, Ser: 1.07 mg/dL — ABNORMAL HIGH (ref 0.44–1.00)
GFR calc non Af Amer: 43 mL/min — ABNORMAL LOW (ref >60)
GFR calc non Af Amer: 49 mL/min — ABNORMAL LOW (ref >60)
GFR, EST AFRICAN AMERICAN: 50 mL/min — AB (ref >60)
GFR, EST AFRICAN AMERICAN: 57 mL/min — AB (ref >60)
GLUCOSE: 246 mg/dL — AB (ref 65–99)
Glucose, Bld: 192 mg/dL — ABNORMAL HIGH (ref 65–99)
Potassium: 3.3 mmol/L — ABNORMAL LOW (ref 3.5–5.1)
Potassium: 4.1 mmol/L (ref 3.5–5.1)
Sodium: 138 mmol/L (ref 135–145)
Sodium: 137 mmol/L (ref 135–145)

## 2016-12-30 LAB — LIPID PANEL
CHOL/HDL RATIO: 3.1 ratio
CHOLESTEROL: 149 mg/dL (ref 0–200)
HDL: 48 mg/dL (ref >40)
LDL Cholesterol: 90 mg/dL (ref 0–99)
Triglycerides: 54 mg/dL (ref <150)
VLDL: 11 mg/dL (ref 0–40)

## 2016-12-30 LAB — HEPARIN LEVEL (UNFRACTIONATED)
HEPARIN UNFRACTIONATED: 0.86 [IU]/mL — AB (ref 0.30–0.70)
Heparin Unfractionated: 0.9 IU/mL — ABNORMAL HIGH (ref 0.30–0.70)
Heparin Unfractionated: 0.65 IU/mL (ref 0.30–0.70)
Heparin Unfractionated: 0.57 IU/mL (ref 0.30–0.70)

## 2016-12-30 LAB — MRSA PCR SCREENING: MRSA BY PCR: NEGATIVE

## 2016-12-30 LAB — MAGNESIUM: Magnesium: 1.7 mg/dL (ref 1.7–2.4)

## 2016-12-30 MED ORDER — MAGNESIUM SULFATE 2 GM/50ML IV SOLN
2.0000 g | Freq: Once | INTRAVENOUS | Status: AC
  Administered 2016-12-30: 2 g via INTRAVENOUS

## 2016-12-30 MED ORDER — POTASSIUM CHLORIDE 10 MEQ/100ML IV SOLN
10.0000 meq | INTRAVENOUS | Status: AC
  Administered 2016-12-30 (×4): 10 meq via INTRAVENOUS
  Filled 2016-12-30 (×4): qty 100

## 2016-12-30 MED ORDER — INSULIN ASPART 100 UNIT/ML ~~LOC~~ SOLN
0.0000 [IU] | SUBCUTANEOUS | Status: DC
  Administered 2016-12-30 (×2): 3 [IU] via SUBCUTANEOUS
  Administered 2016-12-30: 2 [IU] via SUBCUTANEOUS
  Administered 2016-12-30: 5 [IU] via SUBCUTANEOUS
  Administered 2016-12-30 – 2016-12-31 (×3): 2 [IU] via SUBCUTANEOUS
  Administered 2016-12-31: 1 [IU] via SUBCUTANEOUS
  Administered 2016-12-31: 2 [IU] via SUBCUTANEOUS
  Administered 2016-12-31: 3 [IU] via SUBCUTANEOUS
  Administered 2016-12-31 – 2017-01-02 (×10): 2 [IU] via SUBCUTANEOUS
  Administered 2017-01-02: 3 [IU] via SUBCUTANEOUS
  Administered 2017-01-02: 2 [IU] via SUBCUTANEOUS
  Administered 2017-01-02: 1 [IU] via SUBCUTANEOUS
  Administered 2017-01-03: 3 [IU] via SUBCUTANEOUS
  Administered 2017-01-03: 2 [IU] via SUBCUTANEOUS
  Administered 2017-01-03: 9 [IU] via SUBCUTANEOUS
  Administered 2017-01-03: 3 [IU] via SUBCUTANEOUS
  Administered 2017-01-03 (×2): 2 [IU] via SUBCUTANEOUS
  Administered 2017-01-04: 1 [IU] via SUBCUTANEOUS
  Administered 2017-01-04 (×3): 3 [IU] via SUBCUTANEOUS
  Administered 2017-01-05 (×4): 2 [IU] via SUBCUTANEOUS
  Administered 2017-01-05: 9 [IU] via SUBCUTANEOUS
  Administered 2017-01-05 – 2017-01-06 (×3): 2 [IU] via SUBCUTANEOUS
  Administered 2017-01-06: 3 [IU] via SUBCUTANEOUS
  Administered 2017-01-06: 2 [IU] via SUBCUTANEOUS

## 2016-12-30 MED ORDER — FENTANYL CITRATE (PF) 100 MCG/2ML IJ SOLN
25.0000 ug | INTRAMUSCULAR | Status: DC | PRN
  Administered 2016-12-30 – 2016-12-31 (×4): 50 ug via INTRAVENOUS
  Filled 2016-12-30 (×4): qty 2

## 2016-12-30 MED ORDER — MIDAZOLAM HCL 2 MG/2ML IJ SOLN
1.0000 mg | INTRAMUSCULAR | Status: DC | PRN
  Administered 2016-12-31: 2 mg via INTRAVENOUS
  Filled 2016-12-30: qty 2

## 2016-12-30 MED FILL — Medication: Qty: 1 | Status: AC

## 2016-12-30 NOTE — Progress Notes (Signed)
ANTICOAGULATION CONSULT NOTE - Follow Up Consult  Pharmacy Consult for heparin Indication: s/p VF arrest  Labs: Recent Labs    12/29/16 1200 12/29/16 1245 12/29/16 1811  12/30/16 0058 12/30/16 0425 12/30/16 1417 12/30/16 1418 12/30/16 2222  HGB 16.0*  --   --   --   --  12.3  --   --   --   HCT 47.0*  --   --   --   --  39.3  --   --   --   PLT  --   --   --   --   --  174  --   --   --   HEPARINUNFRC  --   --   --    < >  --  0.86* 0.65  --  0.57  CREATININE 1.20*  --  1.41*  --   --  1.20*  --  1.07*  --   TROPONINI  --  0.05* <0.03  --  1.11*  --   --   --   --    < > = values in this interval not displayed.   Assessment:  76yo female admitted VFib arrest (STEMI). Patient was not a candidate for PCI, so conducting medication management. Confirmatory heparin level is within goal range at 0.57 on 950 units/hr. No infusion issues per nursing. Nursing is noticing slight bleeding around A-line and central line- pharmacy will continue to watch closely. CBC was stable this morning.   Goal of Therapy: Heparin level 0.3-0.7 units/ml Monitor platelets by anticoagulation protocol: Yes  Plan: - Continue heparin at current infusion rate of 950 units/hr - Monitor daily HL, CBC, and s/s of bleeding  Doylene Canard, PharmD Clinical Pharmacist  Pager: [phone] Phone: [phone] 12/30/2016, 10:52 PM

## 2016-12-30 NOTE — Progress Notes (Signed)
Initial Nutrition Assessment  DOCUMENTATION CODES:   Not applicable  INTERVENTION:   If unable to extubate within 24 hours, recommend start TF via OGT:   Vital High Protein at 50 ml/h (1200 ml per day)   Provides 1200 kcal (1438 kcal total with Propofol), 105 gm protein, 1003 ml free water daily  NUTRITION DIAGNOSIS:   Inadequate oral intake related to inability to eat as evidenced by NPO status.  GOAL:   Patient will meet greater than or equal to 90% of their needs  MONITOR:   Vent status, Labs, Skin, I & O's  REASON FOR ASSESSMENT:   Ventilator    ASSESSMENT:   76 yo female with PMH of stroke, HTN, HLD, GERD, MI, PVD, DM, CHF who was admitted on 11/5 with V fib cardiac arrest, unknown time to resuscitation; also had torsades arrest in the early AM of 11/6.  Discussed patient in ICU rounds and with RN today. Patient remains a full code. Hopeful for family to come in today for a meeting to discuss plan of care. OGT in place, no plans to start feeding today.  Patient is currently intubated on ventilator support MV: 8.4 L/min Temp (24hrs), Avg:98.3 F (36.8 C), Min:95.7 F (35.4 C), Max:99.5 F (37.5 C)  Propofol: 9 ml/hr providing 238 kcal from lipid. Labs reviewed. Potassium 3.3 (L) CBG's: [phone] Medications reviewed and include Propofol and KCl.   NUTRITION - FOCUSED PHYSICAL EXAM:    Most Recent Value  Orbital Region  No depletion  Upper Arm Region  No depletion  Thoracic and Lumbar Region  Unable to assess  Buccal Region  No depletion  Temple Region  Mild depletion  Clavicle Bone Region  Mild depletion  Clavicle and Acromion Bone Region  No depletion  Scapular Bone Region  Unable to assess  Dorsal Hand  No depletion  Patellar Region  No depletion  Anterior Thigh Region  No depletion  Posterior Calf Region  No depletion  Edema (RD Assessment)  Moderate  Hair  Reviewed  Eyes  Unable to assess  Mouth  Unable to assess  Skin  Reviewed  Nails   Reviewed       Diet Order:  Diet NPO time specified  EDUCATION NEEDS:   No education needs have been identified at this time  Skin:  Skin Assessment: Skin Integrity Issues: Skin Integrity Issues:: (venous stasis ulcer to R leg; R groin incision)  Last BM:  unknown  Height:   Ht Readings from Last 1 Encounters:  12/29/16 5\' 2"  (1.575 m)    Weight:   Wt Readings from Last 1 Encounters:  12/30/16 161 lb 9.6 oz (73.3 kg)    Ideal Body Weight:  50 kg  BMI:  Body mass index is 29.56 kg/m.  Estimated Nutritional Needs:   Kcal:  1450  Protein:  100-110 gm  Fluid:  1.5 L    Molli Barrows, RD, LDN, [place] Pager [phone] After Hours Pager [phone]

## 2016-12-30 NOTE — Progress Notes (Signed)
Inpatient Diabetes Program Recommendations  AACE/ADA: New Consensus Statement on Inpatient Glycemic Control (2015)  Target Ranges:  Prepandial:   less than 140 mg/dL      Peak postprandial:   less than 180 mg/dL (1-2 hours)      Critically ill patients:  140 - 180 mg/dL    Results for DOMINI, VANDEHEI (MRN [id number]) as of 12/30/2016 08:44  Ref. Range 12/29/2016 11:54 12/29/2016 15:10 12/29/2016 19:46 12/30/2016 01:01 12/30/2016 04:35 12/30/2016 08:25  Glucose-Capillary Latest Ref Range: 65 - 99 mg/dL 166 (H) 94 168 (H) 254 (H) 224 (H) 195 (H)   Admit with: VT/ VF arrest due to NSTEMI  History: DM, CKD, CHF  Home DM Meds: Lantus 20 units QHS       Novolog 10 units TID  Current Insulin Orders: Novolog Sensitive Correction Scale/ SSI (0-9 units) Q4 hours       MD- Note patient takes Lantus at home.  Please consider starting Lantus 10 units daily (50% total home dose to start)      --Will follow patient during hospitalization--  Wyn Quaker RN, MSN, CDE Diabetes Coordinator Inpatient Glycemic Control Team Team Pager: [phone] (8a-5p)

## 2016-12-30 NOTE — Progress Notes (Signed)
ANTICOAGULATION CONSULT NOTE - Follow Up Consult  Pharmacy Consult for heparin Indication: s/p VF arrest  Labs: Recent Labs    12/29/16 1200 12/29/16 1245 12/29/16 1811 12/30/16 0057 12/30/16 0058 12/30/16 0425 12/30/16 1417 12/30/16 1418  HGB 16.0*  --   --   --   --  12.3  --   --   HCT 47.0*  --   --   --   --  39.3  --   --   PLT  --   --   --   --   --  174  --   --   HEPARINUNFRC  --   --   --  0.90*  --  0.86* 0.65  --   CREATININE 1.20*  --  1.41*  --   --  1.20*  --  1.07*  TROPONINI  --  0.05* <0.03  --  1.11*  --   --   --    Assessment:  76yo female admitted VFib arrest (STEMI). Patient was not a candidate for PCI, so conducting medication management. Heparin is currently being used for this indication. Patient has a heparin level at goal. No signs or symptoms of bleeding.   Goal of Therapy: Heparin level 0.3-0.7 units/ml Monitor platelets by anticoagulation protocol: Yes  Plan: - Continue heparin at current infusion rate of 950 units/hr - F/U 8hr HL - Monitor daily HL, CBC, and s/s of bleeding  Biagio Borg PharmD Candidate  I discussed / reviewed the pharmacy note by Ms. Anderson and I agree with the student's findings and plans as documented.   Sloan Leiter, PharmD, BCPS, BCCCP Clinical Pharmacist Clinical phone 12/30/2016 until 3:30PM - #[phone] After hours, please call [phone] 12/30/2016, 3:33 PM

## 2016-12-30 NOTE — Progress Notes (Signed)
  Echocardiogram 2D Echocardiogram has been performed.  Joeli Fenner G Layah Skousen 12/30/2016, 10:06 AM

## 2016-12-30 NOTE — Progress Notes (Addendum)
ANTICOAGULATION CONSULT NOTE - Follow Up Consult  Pharmacy Consult for heparin Indication: s/p VF arrest  Labs: Recent Labs    12/29/16 1200 12/29/16 1245 12/29/16 1811 12/30/16 0057 12/30/16 0058 12/30/16 0425  HGB 16.0*  --   --   --   --  12.3  HCT 47.0*  --   --   --   --  39.3  PLT  --   --   --   --   --  174  HEPARINUNFRC  --   --   --  0.90*  --  0.86*  CREATININE 1.20*  --  1.41*  --   --   --   TROPONINI  --  0.05* <0.03  --  1.11*  --     Assessment/Plan:  76yo female above goal on heparin though bolus and drip were held for CVC placement so lab was drawn in close proximity to bolus, which is likely still affecting level. Will continue gtt at current rate and check additional level.   Wynona Neat, PharmD, BCPS  12/30/2016,2:15 AM   ADDENDUM: Repeat heparin level remains high at 0.86.  Will decrease heparin gtt by ~1 unit/kg/hr to 950 units/hr and check level in 8hr. VB    5:28 AM

## 2016-12-30 NOTE — Progress Notes (Signed)
ANTICOAGULATION CONSULT NOTE - Follow Up Consult  Pharmacy Consult for heparin Indication: s/p VF arrest  Labs: Recent Labs    12/29/16 1200 12/29/16 1245 12/29/16 1811  12/30/16 0058 12/30/16 0425 12/30/16 1417 12/30/16 1418 12/30/16 2222  HGB 16.0*  --   --   --   --  12.3  --   --   --   HCT 47.0*  --   --   --   --  39.3  --   --   --   PLT  --   --   --   --   --  174  --   --   --   HEPARINUNFRC  --   --   --    < >  --  0.86* 0.65  --  0.57  CREATININE 1.20*  --  1.41*  --   --  1.20*  --  1.07*  --   TROPONINI  --  0.05* <0.03  --  1.11*  --   --   --   --    < > = values in this interval not displayed.    Assessment/Plan:  76yo female remains therapeutic on heparin. Will continue gtt at current rate and monitor daily level.   Wynona Neat, PharmD, BCPS  12/30/2016,10:52 PM

## 2016-12-30 NOTE — Progress Notes (Signed)
eLink Physician-Brief Progress Note Patient Name: [name] DOB: May 28, 1940 MRN: [id number]   Date of Service  12/30/2016  HPI/Events of Note  Blood glucose = 254. Patient on AC sensitive Novolog SSI. NPO.   eICU Interventions  Will order: 1. Change to Q 4 hour sensitive Novolog SSI.     Intervention Category Major Interventions: Hyperglycemia - active titration of insulin therapy  Sommer,Steven Eugene 12/30/2016, 1:22 AM

## 2016-12-30 NOTE — Progress Notes (Signed)
PCCM Attending Re-Rounding Note:  Patient on Levophed at 2 and Propofol at 20. Afternoon labs reviewed. LA normal. Magnesium 1.7 & K 4.1. Renal function stable with Creatinine 1.07 & BUN 17. Giving Magnesium Sulfate verbal order for 2gm IV. Weaning Propofol & ordering Versed IV prn for sedation & patient comfort along with Fentanyl.   Sonia Baller Ashok Cordia, M.D. [facility] Pager:  [phone] After 7pm or if no response, call [phone] 4:00 PM 12/30/16

## 2016-12-30 NOTE — Progress Notes (Signed)
Subjective:  Patient follows commands and able to follow me across the room. Able to blink on command. On low dose Diprivan and 4 mcg Norepinephrine.   Objective:  Vital Signs in the last 24 hours: Temp:  [95.7 F (35.4 C)-99.1 F (37.3 C)] 99 F (37.2 C) (11/06 0400) Pulse Rate:  [33-153] 74 (11/06 0700) Resp:  [6-36] 11 (11/06 0700) BP: (57-230)/(41-128) 113/57 (11/06 0700) SpO2:  [92 %-100 %] 99 % (11/06 0700) Arterial Line BP: (88-214)/(41-97) 123/45 (11/06 0700) FiO2 (%):  [40 %-100 %] 60 % (11/06 0425) Weight:  [73.3 kg (161 lb 9.6 oz)-75.4 kg (166 lb 5 oz)] 73.3 kg (161 lb 9.6 oz) (11/06 0453)  Intake/Output from previous day: 11/05 0701 - 11/06 0700 In: 465.3 [I.V.:455.3] Out: 615 [Urine:615] Intake/Output from this shift: No intake/output data recorded.  Physical Exam:  Constitutional:  Chronically ill looking, who is unresponsive.  HENT:  Head: Atraumatic.  Mouth/Throat: ET tube in place Cardiovascular:  S1 and S2 normal, S4 gallop heard.  Respiratory: Effort normal and breath sounds normal.  GI: Soft. Bowel sounds are normal.  Musculoskeletal: Chronically ischemic looking, no ulceration, thick skin, 2+ bilateral leg edema right worse than the left.  Vascular: Absent pedal pulse bilateral. Healed leg /ankle ulcer noted.   Lab Results: Recent Labs    12/29/16 1200 12/30/16 0425  WBC  --  12.5*  HGB 16.0* 12.3  PLT  --  174   Recent Labs    12/29/16 1811 12/30/16 0425  NA 139 138  K 3.2* 3.3*  CL 105 105  CO2 23 23  GLUCOSE 241* 246*  BUN 20 20  CREATININE 1.41* 1.20*   Recent Labs    12/29/16 1811 12/30/16 0058  TROPONINI <0.03 1.11*   Hepatic Function Panel Recent Labs    12/29/16 1811  PROT 6.7  ALBUMIN 3.3*  AST 265*  ALT 158*  ALKPHOS 91  BILITOT 0.7   Recent Labs    12/30/16 0425  CHOL 149    Imaging: Ct Head Wo Contrast  Result Date: 12/29/2016 CLINICAL DATA:  76 year old female became unresponsive on the bus. V fib  cardiac arrest and subsequent resuscitation. Remains unresponsive. EXAM: CT HEAD WITHOUT CONTRAST TECHNIQUE: Contiguous axial images were obtained from the base of the skull through the vertex without intravenous contrast. COMPARISON:  Head CTs 05/04/2012 and earlier. FINDINGS: Brain: Chronic hypodensity in the bilateral corona radiata, basal ganglia and deep white matter capsules compatible with chronic small vessel ischemia. Heterogeneity in the thalami appears mildly progressed since prior studies. Similar cerebral volume overall. Additional patchy bilateral cerebral white matter hypodensity. Small chronic lacunar infarct in the inferior right cerebellum is stable. No midline shift, ventriculomegaly, mass effect, evidence of mass lesion, intracranial hemorrhage or evidence of cortically based acute infarction. Vascular: Extensive Calcified atherosclerosis at the skull base. No suspicious intracranial vascular hyperdensity. Frequent dural calcifications. Skull: Stable.  No acute osseous abnormality identified. Sinuses/Orbits: The patient appears intubated on the scout view. Opacified posterior nasal cavity and visible nasopharynx. Mild mucosal thickening in the posterior ethmoids and maxillary sinuses. Superimposed small right maxillary mucous retention cyst which appears stable since 2013. Tympanic cavities and mastoids remain clear. Other: No acute orbit or scalp soft tissue findings. IMPRESSION: 1.  No acute intracranial abnormality identified. 2. Advanced chronic small vessel disease with mild progression suspected in the deep gray matter since 2014. 3. Intubation related fluid in the pharynx. Electronically Signed   By: Genevie Ann M.D.   On: 12/29/2016 12:56  Dg Chest Port 1 View  Result Date: 12/29/2016 CLINICAL DATA:  Cardiac arrest, status post CPR EXAM: PORTABLE CHEST 1 VIEW COMPARISON:  Chest radiograph 12/29/2016 FINDINGS: Endotracheal tube tip is approximately 2.8 cm above the inferior carina.  Nasogastric tube has been advanced on the side port is now just below the diaphragm. There is mild cardiomegaly and mild interstitial pulmonary edema. No large pleural effusion. IMPRESSION: 1. Endotracheal tube tip approximately 2.8 cm above the inferior margin of the carina. 2. Nasogastric tube side port just below the diaphragm, at approximately the level of the GE junction. Advancing by 5-7 cm when sure that the side port is within the stomach. 3. Mild cardiomegaly and interstitial pulmonary edema. Electronically Signed   By: Ulyses Jarred M.D.   On: 12/29/2016 18:18   Dg Chest Portable 1 View  Result Date: 12/29/2016 CLINICAL DATA:  Intubated patient EXAM: PORTABLE CHEST 1 VIEW COMPARISON:  Portable chest x-ray of Jul 13, 2016 FINDINGS: The lungs are borderline hypoinflated. The endotracheal tube tip lies approximately 3.8 cm above the carina there is no alveolar infiltrate or pleural effusion. The heart is top-normal in size. The pulmonary vascularity is normal. External pacemaker defibrillator pads are present. The patient has undergone previous CABG. The esophagogastric tube tip in proximal port lie in the distal esophagus. There is mild gaseous distention of the stomach. The bony thorax exhibits no acute abnormality. IMPRESSION: Advancement of the nasogastric tube by approximately 20 cm is needed to assure the proximal port and the tip lie below the GE junction. The stomach is mildly distended with gas. The endotracheal tube tip is in reasonable position. Mild cardiomegaly.  No pulmonary vascular congestion or pneumonia. Electronically Signed   By: David  Martinique M.D.   On: 12/29/2016 12:36    Cardiac Studies:  Echo pending  EKG 12/29/2016: Sinus tachycardia, left atrial abnormality, performed 3 mm global ST depression with 1 PVC and one ventricular couplet.  Compared to prior EKG in May 2018, profound ST depression globally is new, previously LVH with repolarization abnormality with T wave  inversion in lateral leads.  Scheduled Meds: . aspirin  325 mg Per Tube Daily  . atorvastatin  40 mg Per Tube q1800  . chlorhexidine gluconate (MEDLINE KIT)  15 mL Mouth Rinse BID  . clopidogrel  75 mg Per Tube Daily  . insulin aspart  0-9 Units Subcutaneous Q4H  . mouth rinse  15 mL Mouth Rinse QID  . metoprolol tartrate  2.5 mg Intravenous Q6H  . pantoprazole (PROTONIX) IV  40 mg Intravenous Q24H   Continuous Infusions: . sodium chloride    . heparin 950 Units/hr (12/30/16 0600)  . nitroGLYCERIN    . norepinephrine (LEVOPHED) Adult infusion 4 mcg/min (12/30/16 0700)  . potassium chloride    . propofol (DIPRIVAN) infusion 10 mcg/kg/min (12/30/16 0700)   PRN Meds:.sodium chloride   Assessment/Plan:  1. VT/ VF arrest due to NSTEMI 2.  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  History of stroke with dense right hemiplegia 4.  Hypertension 5.  Diabetes mellitus type 2 controlled with stage III chronic kidney disease, without hypoglycemia  Rec: Will check echo. Medical therapy for CAD. On low dose pressor today. Following commands ( think she follows me and able to blink on command and tries to  Nod head. Continue aggressive medical therapy with goals of extubation. Family very understanding.    LOS: 1 day    Ulice Dash  Einar Gip, MD 12/30/2016, 8:34 AM [facility] Pager: [phone] Office: [phone] If no answer: Cell:  [phone]  12/30/2016, 8:22 AM

## 2016-12-30 NOTE — Progress Notes (Signed)
PULMONARY / CRITICAL CARE MEDICINE   Name: [name] [name] MRN: [id number] DOB: 21-Jan-1941    ADMISSION DATE:  12/29/2016    CHIEF COMPLAINT:  Outpatient VF arrest  HISTORY OF PRESENT ILLNESS:   This is a 76 year old type II diabetic with a known past history of coronary artery disease and a history of a CVA which is rendered her a phasic and wheelchair-bound.  She also has a recent diagnosis of breast cancer.  He was riding a bus with her grandson today when she suddenly became unresponsive.  When EMS arrived she was in V. fib and she was cardioverted with 1 shock.  EKG on arrival in the department of emergency medicine showed septal ST elevation and diffuse ST depression.  She is well-known to Dr. Einar Gip of the cardiology service who feels that medical care only is the appropriate option for this patient in light of her multiple medical comorbidities.  Unfortunately she received rocuronium just before my examination and I can make no comment on her neurological status nor can I obtain any history from the patient.   REVIEW OF SYSTEMS:   Not obtainable  SUBJECTIVE:  Noted to have a torsades arrest during the night.  She required shock x1 and magnesium.  Currently on Levophed drip.  Sedated with deprivation.  VITAL SIGNS: BP (!) 113/57   Pulse 74   Temp 99 F (37.2 C)   Resp 11   Ht 5\' 2"  (1.575 m)   Wt 161 lb 9.6 oz (73.3 kg)   SpO2 99%   BMI 29.56 kg/m   HEMODYNAMICS:    VENTILATOR SETTINGS: Vent Mode: PRVC FiO2 (%):  [40 %-100 %] 60 % Set Rate:  [18 bmp-21 bmp] 21 bmp Vt Set:  [400 mL] 400 mL PEEP:  [5 cmH20] 5 cmH20 Plateau Pressure:  [15 cmH20-18 cmH20] 15 cmH20  INTAKE / OUTPUT: I/O last 3 completed shifts: In: 465.3 [I.V.:455.3; Other:10] Out: 615 [Urine:615]  PHYSICAL EXAMINATION: General: Elderly Afro-American female sedated with the proband. HEENT: MM pink/moist.  No deep JVD or lymphadenopathy appreciated. PSY: Not available   Neuro: moves left side  spontaneously.  Does not follow commands.  Is currently on propofol drip.  Notes she does track at times. CV: Heart sounds are regular regular rate and rhythm.  Currently on Levophed drip PULM: Decreased breath sounds bilaterally. YS:AYTK, non-tender, bsx4 active  Extremities: Lower extremities are cool pulses are barely palpable.  Obvious lower extremity ischemia in the past Skin: Right foot shows old scarring.  LABS:  BMET Recent Labs  Lab 12/29/16 1200 12/29/16 1811 12/30/16 0425  NA 141 139 138  K 3.2* 3.2* 3.3*  CL 101 105 105  CO2  --  23 23  BUN 17 20 20   CREATININE 1.20* 1.41* 1.20*  GLUCOSE 179* 241* 246*    Electrolytes Recent Labs  Lab 12/29/16 1811 12/30/16 0425  CALCIUM 8.4* 8.6*  MG 1.7  --   PHOS 4.5  --     CBC Recent Labs  Lab 12/29/16 1200 12/30/16 0425  WBC  --  12.5*  HGB 16.0* 12.3  HCT 47.0* 39.3  PLT  --  174    Coag's No results for input(s): APTT, INR in the last 168 hours.  Sepsis Markers Recent Labs  Lab 12/29/16 1200 12/29/16 1811  LATICACIDVEN 4.07* 3.6*    ABG Recent Labs  Lab 12/29/16 1300 12/29/16 1751 12/30/16 0330  PHART 7.295* 7.334* 7.389  PCO2ART 56.3* 42.4 41.2  PO2ART 234.0* 53.0* 394*  Liver Enzymes Recent Labs  Lab 12/29/16 1811  AST 265*  ALT 158*  ALKPHOS 91  BILITOT 0.7  ALBUMIN 3.3*    Cardiac Enzymes Recent Labs  Lab 12/29/16 1245 12/29/16 1811 12/30/16 0058  TROPONINI 0.05* <0.03 1.11*    Glucose Recent Labs  Lab 12/29/16 1154 12/29/16 1510 12/29/16 1946 12/30/16 0101 12/30/16 0435  GLUCAP 166* 94 168* 254* 224*    Imaging Ct Head Wo Contrast  Result Date: 12/29/2016 CLINICAL DATA:  76 year old female became unresponsive on the bus. V fib cardiac arrest and subsequent resuscitation. Remains unresponsive. EXAM: CT HEAD WITHOUT CONTRAST TECHNIQUE: Contiguous axial images were obtained from the base of the skull through the vertex without intravenous contrast. COMPARISON:   Head CTs 05/04/2012 and earlier. FINDINGS: Brain: Chronic hypodensity in the bilateral corona radiata, basal ganglia and deep white matter capsules compatible with chronic small vessel ischemia. Heterogeneity in the thalami appears mildly progressed since prior studies. Similar cerebral volume overall. Additional patchy bilateral cerebral white matter hypodensity. Small chronic lacunar infarct in the inferior right cerebellum is stable. No midline shift, ventriculomegaly, mass effect, evidence of mass lesion, intracranial hemorrhage or evidence of cortically based acute infarction. Vascular: Extensive Calcified atherosclerosis at the skull base. No suspicious intracranial vascular hyperdensity. Frequent dural calcifications. Skull: Stable.  No acute osseous abnormality identified. Sinuses/Orbits: The patient appears intubated on the scout view. Opacified posterior nasal cavity and visible nasopharynx. Mild mucosal thickening in the posterior ethmoids and maxillary sinuses. Superimposed small right maxillary mucous retention cyst which appears stable since 2013. Tympanic cavities and mastoids remain clear. Other: No acute orbit or scalp soft tissue findings. IMPRESSION: 1.  No acute intracranial abnormality identified. 2. Advanced chronic small vessel disease with mild progression suspected in the deep gray matter since 2014. 3. Intubation related fluid in the pharynx. Electronically Signed   By: Genevie Ann M.D.   On: 12/29/2016 12:56   Dg Chest Port 1 View  Result Date: 12/29/2016 CLINICAL DATA:  Cardiac arrest, status post CPR EXAM: PORTABLE CHEST 1 VIEW COMPARISON:  Chest radiograph 12/29/2016 FINDINGS: Endotracheal tube tip is approximately 2.8 cm above the inferior carina. Nasogastric tube has been advanced on the side port is now just below the diaphragm. There is mild cardiomegaly and mild interstitial pulmonary edema. No large pleural effusion. IMPRESSION: 1. Endotracheal tube tip approximately 2.8 cm above  the inferior margin of the carina. 2. Nasogastric tube side port just below the diaphragm, at approximately the level of the GE junction. Advancing by 5-7 cm when sure that the side port is within the stomach. 3. Mild cardiomegaly and interstitial pulmonary edema. Electronically Signed   By: Ulyses Jarred M.D.   On: 12/29/2016 18:18   Dg Chest Portable 1 View  Result Date: 12/29/2016 CLINICAL DATA:  Intubated patient EXAM: PORTABLE CHEST 1 VIEW COMPARISON:  Portable chest x-ray of Jul 13, 2016 FINDINGS: The lungs are borderline hypoinflated. The endotracheal tube tip lies approximately 3.8 cm above the carina there is no alveolar infiltrate or pleural effusion. The heart is top-normal in size. The pulmonary vascularity is normal. External pacemaker defibrillator pads are present. The patient has undergone previous CABG. The esophagogastric tube tip in proximal port lie in the distal esophagus. There is mild gaseous distention of the stomach. The bony thorax exhibits no acute abnormality. IMPRESSION: Advancement of the nasogastric tube by approximately 20 cm is needed to assure the proximal port and the tip lie below the GE junction. The stomach is mildly distended with gas.  The endotracheal tube tip is in reasonable position. Mild cardiomegaly.  No pulmonary vascular congestion or pneumonia. Electronically Signed   By: David  Martinique M.D.   On: 12/29/2016 12:36     STUDIES:     DISCUSSION: This is a 76 year old diabetic with a history of prior CABG and persistent hemic chest pain suffered an out of hospital V. fib arrest earlier today.  I am to resuscitation is not known.  She received 1 shock in the field and converted to sinus rhythm.  Initial EKG showed ST elevation in V1 and diffuse ST segment depression.  She was seen by Dr. Einar Gip of interventional cardiology who knows the patient well and tells me that in light of her multiple medical comorbidities he feels it would be inappropriate to attempt an  acute intervention.  She had a torsades arrest 12/30/2016.  The family has expressed a desire that she remain intubated for a minimum of 7 days. The family does not want long-term intubation or tracheostomy.  ASSESSMENT / PLAN:  PULMONARY A:    Vent dependent respiratory failure secondary to cardiac arrest x2 P: Ventilation bundle as ordered Not weanable at this time secondary to altered mental status. Her neurological and cardiac issues are main focus at this time.  CARDIOVASCULAR A:  Post V. fib arrest on 12/29/2016 Torsades with subsequent arrest on 12/30/2016 Significant cardiac history of congestive heart failure coronary bypass grafting. Evaluation by cardiology with medical management. No hypothermia protocol at this time.  P: Heparin drip per pharmacy Replete magnesium phosphorus and potassium Cardiac monitoring Cardiology is following. 2D echo has been ordered by cardiology  Neuro: A; Altered mental status Dense right hemiplegia from previous stroke 3 years ago Altered mental status after cardiac arrest.  P: CT of the head on 12/29/2016 was negative Continue to monitor neurological status. Wean sedation as tolerated  GASTROINTESTINAL A:  GI protection P:  Prophylaxis with Protonix has been ordered OGT to [facility] TF in near future  ENDOCRINE A.  DM poorly controlled with HgbA1C >9 P: SSI with tighter control of glucose   App CCT   45 min  Richardson Landry Minor ACNP [facility] Pager [phone] till 1 pm If no answer page [phone] 12/30/2016, 7:59 AM

## 2016-12-31 ENCOUNTER — Inpatient Hospital Stay (HOSPITAL_COMMUNITY): Payer: Medicare HMO

## 2016-12-31 LAB — GLUCOSE, CAPILLARY
GLUCOSE-CAPILLARY: 154 mg/dL — AB (ref 65–99)
GLUCOSE-CAPILLARY: 155 mg/dL — AB (ref 65–99)
Glucose-Capillary: 201 mg/dL — ABNORMAL HIGH (ref 65–99)
Glucose-Capillary: 160 mg/dL — ABNORMAL HIGH (ref 65–99)

## 2016-12-31 LAB — BLOOD GAS, ARTERIAL
ACID-BASE EXCESS: 0.8 mmol/L (ref 0.0–2.0)
BICARBONATE: 25.6 mmol/L (ref 20.0–28.0)
DRAWN BY: 345601
FIO2: 50
LHR: 21 {breaths}/min
MECHVT: 400 mL
O2 SAT: 99.3 %
PEEP/CPAP: 5 cmH2O
PH ART: 7.361 (ref 7.350–7.450)
Patient temperature: 98.6
pCO2 arterial: 46.3 mmHg (ref 32.0–48.0)
pO2, Arterial: 198 mmHg — ABNORMAL HIGH (ref 83.0–108.0)

## 2016-12-31 LAB — COMPREHENSIVE METABOLIC PANEL
ALK PHOS: 57 U/L (ref 38–126)
ALT: 78 U/L — AB (ref 14–54)
ANION GAP: 6 (ref 5–15)
AST: 46 U/L — ABNORMAL HIGH (ref 15–41)
Albumin: 2.9 g/dL — ABNORMAL LOW (ref 3.5–5.0)
BILIRUBIN TOTAL: 0.9 mg/dL (ref 0.3–1.2)
BUN: 12 mg/dL (ref 6–20)
CALCIUM: 8.8 mg/dL — AB (ref 8.9–10.3)
CO2: 24 mmol/L (ref 22–32)
CREATININE: 0.92 mg/dL (ref 0.44–1.00)
Chloride: 106 mmol/L (ref 101–111)
GFR, EST NON AFRICAN AMERICAN: 59 mL/min — AB (ref >60)
Glucose, Bld: 190 mg/dL — ABNORMAL HIGH (ref 65–99)
Potassium: 3.6 mmol/L (ref 3.5–5.1)
SODIUM: 136 mmol/L (ref 135–145)
TOTAL PROTEIN: 5.9 g/dL — AB (ref 6.5–8.1)

## 2016-12-31 LAB — CBC
HEMATOCRIT: 34.7 % — AB (ref 36.0–46.0)
HEMOGLOBIN: 11 g/dL — AB (ref 12.0–15.0)
MCH: 27 pg (ref 26.0–34.0)
MCHC: 31.7 g/dL (ref 30.0–36.0)
MCV: 85 fL (ref 78.0–100.0)
Platelets: 123 10*3/uL — ABNORMAL LOW (ref 150–400)
RBC: 4.08 MIL/uL (ref 3.87–5.11)
RDW: 15.1 % (ref 11.5–15.5)
WBC: 9.7 10*3/uL (ref 4.0–10.5)

## 2016-12-31 LAB — MAGNESIUM: MAGNESIUM: 1.9 mg/dL (ref 1.7–2.4)

## 2016-12-31 LAB — PHOSPHORUS: PHOSPHORUS: 2.5 mg/dL (ref 2.5–4.6)

## 2016-12-31 LAB — LACTIC ACID, PLASMA: Lactic Acid, Venous: 0.9 mmol/L (ref 0.5–1.9)

## 2016-12-31 MED ORDER — ORAL CARE MOUTH RINSE
15.0000 mL | Freq: Two times a day (BID) | OROMUCOSAL | Status: DC
  Administered 2016-12-31 – 2017-01-06 (×11): 15 mL via OROMUCOSAL

## 2016-12-31 MED ORDER — SODIUM CHLORIDE 0.9% FLUSH
10.0000 mL | Freq: Two times a day (BID) | INTRAVENOUS | Status: DC
  Administered 2016-12-31 – 2017-01-06 (×12): 10 mL

## 2016-12-31 MED ORDER — CLOPIDOGREL BISULFATE 75 MG PO TABS
75.0000 mg | ORAL_TABLET | Freq: Every day | ORAL | Status: DC
  Filled 2016-12-31: qty 1

## 2016-12-31 MED ORDER — ATORVASTATIN CALCIUM 40 MG PO TABS
40.0000 mg | ORAL_TABLET | Freq: Every day | ORAL | Status: DC
  Administered 2017-01-03 – 2017-01-04 (×2): 40 mg via ORAL
  Filled 2016-12-31 (×6): qty 1

## 2016-12-31 MED ORDER — ASPIRIN 325 MG PO TABS: 325.0000 mg | ORAL_TABLET | Freq: Every day | ORAL | Status: DC

## 2016-12-31 MED ORDER — CHLORHEXIDINE GLUCONATE CLOTH 2 % EX PADS
6.0000 | MEDICATED_PAD | Freq: Every day | CUTANEOUS | Status: DC
  Administered 2016-12-31 – 2017-01-01 (×2): 6 via TOPICAL

## 2016-12-31 MED ORDER — SODIUM CHLORIDE 0.9% FLUSH: 10.0000 mL | INTRAVENOUS | Status: DC | PRN

## 2016-12-31 MED ORDER — HEPARIN SODIUM (PORCINE) 5000 UNIT/ML IJ SOLN
5000.0000 [IU] | Freq: Three times a day (TID) | INTRAMUSCULAR | Status: DC
  Administered 2016-12-31 – 2017-01-06 (×18): 5000 [IU] via SUBCUTANEOUS
  Filled 2016-12-31 (×23): qty 1

## 2016-12-31 MED ORDER — DEXTROSE-NACL 5-0.45 % IV SOLN
INTRAVENOUS | Status: DC
  Administered 2016-12-31 – 2017-01-01 (×2): via INTRAVENOUS

## 2016-12-31 NOTE — Progress Notes (Signed)
Subjective:  Patient follows commands. Off pressors and plan on extubating today. Left groin  Bleeding from arterial line placement and heparin turned off last night.   Objective:  Vital Signs in the last 24 hours: Temp:  [98.5 F (36.9 C)-100.1 F (37.8 C)] 98.6 F (37 C) (11/07 1552) Pulse Rate:  [72-88] 72 (11/07 1600) Resp:  [0-22] 18 (11/07 1600) BP: (66-148)/(42-109) 121/61 (11/07 1600) SpO2:  [97 %-100 %] 97 % (11/07 1600) Arterial Line BP: (85-161)/(36-63) 144/61 (11/07 1000) FiO2 (%):  [50 %] 50 % (11/07 0800) Weight:  [72.3 kg (159 lb 6.3 oz)] 72.3 kg (159 lb 6.3 oz) (11/07 0456)  Intake/Output from previous day: 11/06 0701 - 11/07 0700 In: 1148.1 [I.V.:698.1; IV Piggyback:450] Out: 750 [Urine:750] Intake/Output from this shift: Total I/O In: 103.3 [I.V.:103.3] Out: 165 [Urine:165]  Physical Exam:  Constitutional:  Chronically ill looking, who is unresponsive.  HENT:  Head: Atraumatic.  Mouth/Throat: ET tube in place Cardiovascular:  S1 and S2 normal, S4 gallop heard.  Respiratory: Effort normal and breath sounds normal.  GI: Soft. Bowel sounds are normal.  Musculoskeletal: Chronically ischemic looking, no ulceration, thick skin, 2+ bilateral leg edema right worse than the left.  Vascular: Absent pedal pulse bilateral. Healed leg /ankle ulcer noted.   Lab Results: Recent Labs    12/30/16 0425 12/31/16 0231  WBC 12.5* 9.7  HGB 12.3 11.0*  PLT 174 123*   Recent Labs    12/30/16 1418 12/31/16 0231  NA 137 136  K 4.1 3.6  CL 105 106  CO2 26 24  GLUCOSE 192* 190*  BUN 17 12  CREATININE 1.07* 0.92   Recent Labs    12/29/16 1811 12/30/16 0058  TROPONINI <0.03 1.11*   Hepatic Function Panel Recent Labs    12/31/16 0231  PROT 5.9*  ALBUMIN 2.9*  AST 46*  ALT 78*  ALKPHOS 57  BILITOT 0.9   Recent Labs    12/30/16 0425  CHOL 149    Imaging: Dg Chest Port 1 View  Result Date: 12/31/2016 CLINICAL DATA:  Respiratory failure EXAM:  PORTABLE CHEST 1 VIEW COMPARISON:  Chest radiograph 12/29/2016 FINDINGS: The endotracheal tube tip is 2.5 cm above the inferior margin of the carina. The central venous catheter tip remains in the lower SVC. CABG markers and median sternotomy wires are unchanged. No focal airspace consolidation or pulmonary edema. Pulmonary edema has decreased from the prior study. No pneumothorax or sizable pleural effusion. Orogastric tube tip and side port are below the diaphragm. IMPRESSION: 1. Endotracheal tube tip 2.5 cm above the inferior carina. This could be retracted by approximately 2-3 cm for optimal positioning. 2. Improved aeration of the lungs. Electronically Signed   By: Ulyses Jarred M.D.   On: 12/31/2016 03:58   Dg Chest Port 1 View  Result Date: 12/29/2016 CLINICAL DATA:  Cardiac arrest, status post CPR EXAM: PORTABLE CHEST 1 VIEW COMPARISON:  Chest radiograph 12/29/2016 FINDINGS: Endotracheal tube tip is approximately 2.8 cm above the inferior carina. Nasogastric tube has been advanced on the side port is now just below the diaphragm. There is mild cardiomegaly and mild interstitial pulmonary edema. No large pleural effusion. IMPRESSION: 1. Endotracheal tube tip approximately 2.8 cm above the inferior margin of the carina. 2. Nasogastric tube side port just below the diaphragm, at approximately the level of the GE junction. Advancing by 5-7 cm when sure that the side port is within the stomach. 3. Mild cardiomegaly and interstitial pulmonary edema. Electronically Signed   By:  Ulyses Jarred M.D.   On: 12/29/2016 18:18   US Abdomen Limited Ruq  Result Date: 12/30/2016 CLINICAL DATA:  Unspecified disorder of liver function, transaminitis. EXAM: ULTRASOUND ABDOMEN LIMITED RIGHT UPPER QUADRANT COMPARISON:  08/15/2008 CT FINDINGS: Gallbladder: No gallstones or wall thickening visualized. Biliary sludge is noted along the dependent wall. No sonographic Murphy sign noted by sonographer. Common bile duct:  Diameter: 8.1 mm in caliber, within normal limits for age. No choledocholithiasis. Liver: No focal lesion identified. Mild intrahepatic ductal dilatation. Within normal limits in parenchymal echogenicity. Portal vein is patent on color Doppler imaging with normal direction of blood flow towards the liver. Other: Mild increase in echogenicity of the adjacent right kidney, nonspecific but may reflect medical renal disease. IMPRESSION: 1. Layering biliary sludge without secondary signs of acute cholecystitis. 2. Mild ectasia of the intra and extrahepatic biliary system, likely within normal limits for age. 3. Slight increase in right renal cortical echogenicity that may reflect medical renal disease. No obstructive uropathy. Electronically Signed   By: Ashley Royalty M.D.   On: 12/30/2016 16:58    Cardiac Studies:  Echo 12/29/2016: Left ventricle:  The cavity size was normal. There was mild concentric hypertrophy. Systolic function was severely reduced. The estimated ejection fraction was in the range of 25% to 30%. Regional wall motion abnormalities:   Akinesis of the anterolateral myocardium.  Severe hypokinesis of the basal-midanterior myocardium. The study is not technically sufficient to allow evaluation of LV diastolic function.  EKG 12/31/2016: Normal sinus rhythm with rate of 72 bpm, LVH with repolarization abnormality, cannot exclude inferior and anterolateral ischemia versus subendocardial infarct.  Compared to 12/29/2016, Global 3 mm ST segment depression not present.  Scheduled Meds: . [START ON 01/01/2017] aspirin  325 mg Oral Daily  . atorvastatin  40 mg Oral q1800  . chlorhexidine gluconate (MEDLINE KIT)  15 mL Mouth Rinse BID  . Chlorhexidine Gluconate Cloth  6 each Topical Daily  . [START ON 01/01/2017] clopidogrel  75 mg Oral Daily  . heparin subcutaneous  5,000 Units Subcutaneous Q8H  . insulin aspart  0-9 Units Subcutaneous Q4H  . mouth rinse  15 mL Mouth Rinse QID  . mouth rinse  15  mL Mouth Rinse BID  . metoprolol tartrate  2.5 mg Intravenous Q6H  . sodium chloride flush  10-40 mL Intracatheter Q12H   Continuous Infusions: . dextrose 5 % and 0.45% NaCl 50 mL/hr at 12/31/16 1507   PRN Meds:.sodium chloride flush   Assessment/Plan:  1. VT/ VF arrest due to NSTEMI 2.  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  History of stroke with dense right hemiplegia 4.  Hypertension 5.  Diabetes mellitus type 2 controlled with stage III chronic kidney disease, without hypoglycemia  Rec:  Medical therapy for CAD, continue ASA and plavix for now and will consider Brilinta once extubated.  Continue Lopressor IV until PO allowed.  Continue aggressive medical therapy with goals of extubation. Family very understanding. Will consider optimization of ischemic cardiomyopathy as we go along.   LOS: 2 days    [name] Prows, MD 12/31/2016, 6:03 PM [facility] Pager: [phone] Office: [phone] If no answer: Cell:  [phone]  12/31/2016, 09:00

## 2016-12-31 NOTE — Progress Notes (Signed)
PULMONARY / CRITICAL CARE MEDICINE   Name: [name] [name] MRN: [id number] DOB: 12-Mar-1940    ADMISSION DATE:  12/29/2016    CHIEF COMPLAINT:  Outpatient VF arrest  HISTORY OF PRESENT ILLNESS:   This is a 76 year old type II diabetic with a known past history of coronary artery disease and a history of a CVA which is rendered her a phasic and wheelchair-bound.  She also has a recent diagnosis of breast cancer.  He was riding a bus with her grandson today when she suddenly became unresponsive.  When EMS arrived she was in V. fib and she was cardioverted with 1 shock.  EKG on arrival in the department of emergency medicine showed septal ST elevation and diffuse ST depression.  She is well-known to Dr. Einar Gip of the cardiology service who feels that medical care only is the appropriate option for this patient in light of her multiple medical comorbidities.  Unfortunately she received rocuronium just before my examination and I can make no comment on her neurological status nor can I obtain any history from the patient.  SUBJECTIVE:  Looks excellent today, weaning, following commands, ready for extubation  VITAL SIGNS: BP (!) 109/58   Pulse 73   Temp 98.5 F (36.9 C) (Oral)   Resp 17   Ht 5\' 2"  (1.575 m)   Wt 159 lb 6.3 oz (72.3 kg)   SpO2 100%   BMI 29.15 kg/m   HEMODYNAMICS:    VENTILATOR SETTINGS: Vent Mode: PRVC FiO2 (%):  [50 %] 50 % Set Rate:  [21 bmp] 21 bmp Vt Set:  [400 mL] 400 mL PEEP:  [5 cmH20] 5 cmH20 Plateau Pressure:  [14 cmH20-15 cmH20] 15 cmH20  INTAKE / OUTPUT: I/O last 3 completed shifts: In: 1594.4 [I.V.:1134.4; Other:10; IV Piggyback:450] Out: 1365 [RFFMB:8466]  PHYSICAL EXAMINATION: General: 76 year old female currently on pressure support ventilation without acute distress.  HEENT: Normocephalic atraumatic, orally intubated, no jugular venous distention, mucous membranes are moist.  Pulmonary: Clear to auscultation, excellent frequency tidal volume ratio on  spontaneous breathing trial effort Cardiac: Regular rate and rhythm without murmur rub or gallop Abdomen: Soft nontender, positive bowel sounds.  extremities/musculoskeletal: Warm, dry, no significant edema, brisk cap refill, strong pulses. Neuro: Awake, follows commands, right-sided hemiplegia LABS:  BMET Recent Labs  Lab 12/30/16 0425 12/30/16 1418 12/31/16 0231  NA 138 137 136  K 3.3* 4.1 3.6  CL 105 105 106  CO2 23 26 24   BUN 20 17 12   CREATININE 1.20* 1.07* 0.92  GLUCOSE 246* 192* 190*    Electrolytes Recent Labs  Lab 12/29/16 1811 12/30/16 0425 12/30/16 1418 12/30/16 1447 12/31/16 0231  CALCIUM 8.4* 8.6* 8.7*  --  8.8*  MG 1.7  --   --  1.7 1.9  PHOS 4.5  --   --   --  2.5    CBC Recent Labs  Lab 12/29/16 1200 12/30/16 0425 12/31/16 0231  WBC  --  12.5* 9.7  HGB 16.0* 12.3 11.0*  HCT 47.0* 39.3 34.7*  PLT  --  174 123*    Coag's No results for input(s): APTT, INR in the last 168 hours.  Sepsis Markers Recent Labs  Lab 12/30/16 1447 12/30/16 1955 12/31/16 0234  LATICACIDVEN 1.0 1.0 0.9    ABG Recent Labs  Lab 12/29/16 1751 12/30/16 0330 12/31/16 0350  PHART 7.334* 7.389 7.361  PCO2ART 42.4 41.2 46.3  PO2ART 53.0* 394* 198*    Liver Enzymes Recent Labs  Lab 12/29/16 1811 12/31/16 0231  AST  265* 46*  ALT 158* 78*  ALKPHOS 91 57  BILITOT 0.7 0.9  ALBUMIN 3.3* 2.9*    Cardiac Enzymes Recent Labs  Lab 12/29/16 1245 12/29/16 1811 12/30/16 0058  TROPONINI 0.05* <0.03 1.11*    Glucose Recent Labs  Lab 12/30/16 1157 12/30/16 1556 12/30/16 1936 12/31/16 0007 12/31/16 0340 12/31/16 0801  GLUCAP 217* 166* 165* 154* 155* 201*    Imaging Dg Chest Port 1 View  Result Date: 12/31/2016 CLINICAL DATA:  Respiratory failure EXAM: PORTABLE CHEST 1 VIEW COMPARISON:  Chest radiograph 12/29/2016 FINDINGS: The endotracheal tube tip is 2.5 cm above the inferior margin of the carina. The central venous catheter tip remains in the  lower SVC. CABG markers and median sternotomy wires are unchanged. No focal airspace consolidation or pulmonary edema. Pulmonary edema has decreased from the prior study. No pneumothorax or sizable pleural effusion. Orogastric tube tip and side port are below the diaphragm. IMPRESSION: 1. Endotracheal tube tip 2.5 cm above the inferior carina. This could be retracted by approximately 2-3 cm for optimal positioning. 2. Improved aeration of the lungs. Electronically Signed   By: Ulyses Jarred M.D.   On: 12/31/2016 03:58   US Abdomen Limited Ruq  Result Date: 12/30/2016 CLINICAL DATA:  Unspecified disorder of liver function, transaminitis. EXAM: ULTRASOUND ABDOMEN LIMITED RIGHT UPPER QUADRANT COMPARISON:  08/15/2008 CT FINDINGS: Gallbladder: No gallstones or wall thickening visualized. Biliary sludge is noted along the dependent wall. No sonographic Murphy sign noted by sonographer. Common bile duct: Diameter: 8.1 mm in caliber, within normal limits for age. No choledocholithiasis. Liver: No focal lesion identified. Mild intrahepatic ductal dilatation. Within normal limits in parenchymal echogenicity. Portal vein is patent on color Doppler imaging with normal direction of blood flow towards the liver. Other: Mild increase in echogenicity of the adjacent right kidney, nonspecific but may reflect medical renal disease. IMPRESSION: 1. Layering biliary sludge without secondary signs of acute cholecystitis. 2. Mild ectasia of the intra and extrahepatic biliary system, likely within normal limits for age. 3. Slight increase in right renal cortical echogenicity that may reflect medical renal disease. No obstructive uropathy. Electronically Signed   By: Ashley Royalty M.D.   On: 12/30/2016 16:58     STUDIES:     DISCUSSION: This is a 76 year old diabetic with a history of prior CABG and persistent hemic chest pain suffered an out of hospital V. fib arrest earlier today.  I am to resuscitation is not known.  She  received 1 shock in the field and converted to sinus rhythm.  Initial EKG showed ST elevation in V1 and diffuse ST segment depression.  She was seen by Dr. Einar Gip of interventional cardiology who knows the patient well and tells me that in light of her multiple medical comorbidities he feels it would be inappropriate to attempt an acute intervention.  She had a torsades arrest 12/30/2016.  She has made remarkable neurological recovery.  Past spontaneous breathing trial.  We will extubate her, continue supportive medical therapies.  ASSESSMENT / PLAN:   Vent dependent respiratory failure secondary to cardiac arrest x2 Chest x-ray personally reviewed: Tracheal tube is in satisfactory position.  There is no infiltrates or edema. Fully awake today, excellent frequency tidal volume ratio, following commands.  Ready for extubation. Plan Extubate to nasal cannula Continue supplemental oxygen as needed Aspiration precautions Pulse oximetry   Post V. fib arrest on 12/29/2016 Torsades with subsequent arrest on 12/30/2016 Significant cardiac history of congestive heart failure coronary bypass grafting. Plan Continue telemetry monitoring  Continue medical therapy with aspirin and Plavix as well as scheduled beta-blockade Ensure normal electrolytes   Acute encephalopathy, concern for ischemic encephalopathy following cardiac arrest Dense right hemiplegia from previous stroke 3 years ago She seems to have had remarkable neurological recovery.  Following commands Plan Continue supportive care Dc heparin, not going to cath lab  Anemia of chronic disease without evidence of bleeding Plan Trend CBC Continue subcu heparin  Shock liver following cardiac arrest LFTs have normalized Plan Repeat LFTs in a.m.  Aspiration risk Plan Assess for SLP need  DM poorly controlled with HgbA1C >9 Plan Sliding scale insulin  My critical care time 32 minutes  Erick Colace ACNP-BC [facility] [place] Pager # [phone] OR # [phone] if no answer

## 2016-12-31 NOTE — Progress Notes (Signed)
Inpatient Diabetes Program Recommendations  AACE/ADA: New Consensus Statement on Inpatient Glycemic Control (2015)  Target Ranges:  Prepandial:   less than 140 mg/dL      Peak postprandial:   less than 180 mg/dL (1-2 hours)      Critically ill patients:  140 - 180 mg/dL   Lab Results  Component Value Date   GLUCAP 201 (H) 12/31/2016   HGBA1C 9.4 (H) 12/29/2016    Review of Glycemic ControlResults for CRYSTALLEE, WERDEN (MRN [id number]) as of 12/31/2016 11:38  Ref. Range 12/30/2016 15:56 12/30/2016 19:36 12/31/2016 00:07 12/31/2016 03:40 12/31/2016 08:01  Glucose-Capillary Latest Ref Range: 65 - 99 mg/dL 166 (H) 165 (H) 154 (H) 155 (H) 201 (H)   Admit with: VT/ VF arrest due to NSTEMI History: DM, CKD, CHF Home DM Meds: Lantus 20 units QHS                             Novolog 10 units TID Current Insulin Orders: Novolog Sensitive Correction Scale/ SSI (0-9 units) Q4 hours  MD- Note patient takes Lantus at home. Blood sugars increased. Please consider starting Lantus 10 units daily (50% total home dose to start).   Thanks, Adah Perl, RN, BC-ADM Inpatient Diabetes Coordinator Pager [phone] (8a-5p)

## 2016-12-31 NOTE — Progress Notes (Signed)
eLink Physician-Brief Progress Note Patient Name: [name] [name] DOB: [birth year] MRN: [id number]   Date of Service  12/31/2016  HPI/Events of Note  Rn notes bloody drainage around art line site continues despite pressure dressing. Also notes blood drainage from NG tube.   eICU Interventions  Hold heparin for now. Will check Hb with AM labs.         Laverle Hobby 12/31/2016, 3:37 AM

## 2016-12-31 NOTE — Progress Notes (Signed)
Increased bleeding from artline, blood clots in urine and in NG tube. Md made aware. Orders to hold heparin infusion. Pharmacy notified.

## 2016-12-31 NOTE — Progress Notes (Signed)
Bleeding around a-line worse then upon initial assessment, new dressing saturated. Pharmacy and MD made aware due to the patient being on a heparin infusion. Orders to continue heparin infusion and continue to monitor per MD.

## 2017-01-01 ENCOUNTER — Inpatient Hospital Stay (HOSPITAL_COMMUNITY): Payer: Medicare HMO

## 2017-01-01 DIAGNOSIS — G255 Other chorea: Secondary | ICD-10-CM

## 2017-01-01 LAB — GLUCOSE, CAPILLARY
GLUCOSE-CAPILLARY: 157 mg/dL — AB (ref 65–99)
GLUCOSE-CAPILLARY: 127 mg/dL — AB (ref 65–99)
Glucose-Capillary: 159 mg/dL — ABNORMAL HIGH (ref 65–99)
Glucose-Capillary: 162 mg/dL — ABNORMAL HIGH (ref 65–99)
Glucose-Capillary: 167 mg/dL — ABNORMAL HIGH (ref 65–99)
Glucose-Capillary: 168 mg/dL — ABNORMAL HIGH (ref 65–99)
Glucose-Capillary: 188 mg/dL — ABNORMAL HIGH (ref 65–99)
Glucose-Capillary: 195 mg/dL — ABNORMAL HIGH (ref 65–99)

## 2017-01-01 LAB — CBC
HCT: 31.4 % — ABNORMAL LOW (ref 36.0–46.0)
Hemoglobin: 9.4 g/dL — ABNORMAL LOW (ref 12.0–15.0)
MCH: 26.1 pg (ref 26.0–34.0)
MCHC: 29.9 g/dL — ABNORMAL LOW (ref 30.0–36.0)
MCV: 87.2 fL (ref 78.0–100.0)
Platelets: 102 10*3/uL — ABNORMAL LOW (ref 150–400)
RBC: 3.6 MIL/uL — ABNORMAL LOW (ref 3.87–5.11)
RDW: 15.4 % (ref 11.5–15.5)
WBC: 6.3 10*3/uL (ref 4.0–10.5)

## 2017-01-01 LAB — BASIC METABOLIC PANEL
ANION GAP: 5 (ref 5–15)
Anion gap: 7 (ref 5–15)
BUN: 9 mg/dL (ref 6–20)
BUN: 9 mg/dL (ref 6–20)
CALCIUM: 9 mg/dL (ref 8.9–10.3)
CHLORIDE: 108 mmol/L (ref 101–111)
CO2: 26 mmol/L (ref 22–32)
CO2: 26 mmol/L (ref 22–32)
CREATININE: 0.84 mg/dL (ref 0.44–1.00)
Calcium: 8.9 mg/dL (ref 8.9–10.3)
Chloride: 105 mmol/L (ref 101–111)
Creatinine, Ser: 0.91 mg/dL (ref 0.44–1.00)
GFR calc Af Amer: >60 mL/min (ref >60)
GFR calc Af Amer: >60 mL/min (ref >60)
GFR calc non Af Amer: >60 mL/min (ref >60)
GFR calc non Af Amer: >60 mL/min (ref >60)
GLUCOSE: 167 mg/dL — AB (ref 65–99)
Glucose, Bld: 201 mg/dL — ABNORMAL HIGH (ref 65–99)
Potassium: 3.4 mmol/L — ABNORMAL LOW (ref 3.5–5.1)
Potassium: 4.1 mmol/L (ref 3.5–5.1)
Sodium: 138 mmol/L (ref 135–145)
Sodium: 139 mmol/L (ref 135–145)

## 2017-01-01 LAB — MAGNESIUM: Magnesium: 1.5 mg/dL — ABNORMAL LOW (ref 1.7–2.4)

## 2017-01-01 MED ORDER — ASPIRIN 300 MG RE SUPP
300.0000 mg | Freq: Every day | RECTAL | Status: DC
  Administered 2017-01-01 – 2017-01-03 (×3): 300 mg via RECTAL
  Filled 2017-01-01 (×4): qty 1

## 2017-01-01 MED ORDER — MAGNESIUM SULFATE 50 % IJ SOLN
6.0000 g | Freq: Once | INTRAVENOUS | Status: AC
  Administered 2017-01-01: 6 g via INTRAVENOUS
  Filled 2017-01-01: qty 10

## 2017-01-01 MED ORDER — ACETAMINOPHEN 650 MG RE SUPP
650.0000 mg | Freq: Once | RECTAL | Status: AC
  Administered 2017-01-01: 650 mg via RECTAL
  Filled 2017-01-01: qty 1

## 2017-01-01 MED ORDER — CLOPIDOGREL BISULFATE 75 MG PO TABS
75.0000 mg | ORAL_TABLET | Freq: Every day | ORAL | Status: DC
  Filled 2017-01-01 (×3): qty 1

## 2017-01-01 MED ORDER — WHITE PETROLATUM EX OINT
TOPICAL_OINTMENT | CUTANEOUS | Status: AC
  Administered 2017-01-01: 21:00:00
  Filled 2017-01-01: qty 28.35

## 2017-01-01 MED ORDER — POTASSIUM CHLORIDE 10 MEQ/50ML IV SOLN
10.0000 meq | INTRAVENOUS | Status: AC
  Administered 2017-01-01 (×6): 10 meq via INTRAVENOUS
  Filled 2017-01-01 (×6): qty 50

## 2017-01-01 NOTE — Evaluation (Signed)
Clinical/Bedside Swallow Evaluation Patient Details  Name: [name] [name] MRN: [id number] Date of Birth: [birth year]  Today's Date: 01/01/2017 Time: SLP Start Time (ACUTE ONLY): 0933 SLP Stop Time (ACUTE ONLY): 1006 SLP Time Calculation (min) (ACUTE ONLY): 33 min  Past Medical History:  Past Medical History:  Diagnosis Date  . Angina   . CHF (congestive heart failure) ([place])   . Complication of anesthesia    lung   . Diabetes mellitus ([place])   . GERD (gastroesophageal reflux disease)   . H/O hiatal hernia   . Hypercholesteremia   . Hypertension   . Myocardial infarction [facility])    CABG 2001, ? stent. Cath 02/2011 with new distal LIMA-LAD 80%. SVG-OM occlusion is old, rest of grafts patent  . Peripheral vascular disease ([place])   . Stroke [facility]) 2002 and 2003   Resultant right hemiparesis and aphasia. Communicates by writing    Past Surgical History:  Past Surgical History:  Procedure Laterality Date  . ABDOMINAL HYSTERECTOMY    . BREAST BIOPSY    . BREAST CYST EXCISION Left    years ago per daughter  . CARDIAC CATHETERIZATION  02/2011  . 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 Patient also has recent diagnosis of breast cancer. Patient become unresponsive the date of admission while traveling with her grandson. Upon EMS arrival patient was in ventricular fibrillation. She underwent cardioversion with one shock; pt had cardiac arrest x2. Given the patient's multiple medical comorbidities the decision was made to continue with medical care. Pt intubated 11/5-11/7. Family reports coughing with eating at home; previous SLP notes show severe oral dysphagia at baseline with mild pharyngeal phase deficits.     Assessment / Plan / Recommendation Clinical Impression  Pt demonstrates acutely worsened swallow function from baseline chronic oral  dyspahgia. Unable to to assess vocal quality as pt is unable to initiate phonate (?baseline) but she reports, by spelling with a communication board, that her throat hurts. Under observation with thin liquids pt has severe anterior spillage, no functional lingual manipulation other than protrusion and delayed swallow response followed by delayed wet coughing in 2/3 trials with concern for possible airway obstruction with puree at one point and difficutly initaiting cough. Bolus removed with suction. Discussed pts baseline function with her daughter over the phone who reported the pt was eating and drinking consistent with prior function up until her cardiac arrest. She was able to feed herself and enjoy thin liquids and regular solids with compensatory strategies for oral dyphagia. Daughter confirmed that ataxic/chorea-like movement is new with this hospitalization. Suggest neurology consult. Pt to remain NPO given concern for decreased airway protection following intubation and possible new neurolgic deficits impacting swallow. Await more time post extubation to f/u with MBS for objective swallow assessment.  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 Patient  also has recent diagnosis of breast cancer. Patient become unresponsive the date of admission while traveling with her grandson. Upon EMS arrival patient was in ventricular fibrillation. She underwent cardioversion with one shock; pt had cardiac arrest x2. Given the patient's multiple  medical comorbidities the decision was made to continue with medical care. Pt intubated 11/5-11/7. Family reports coughing with eating at home; previous SLP notes show severe oral dysphagia at baseline with mild pharyngeal phase deficits.   Type of Study: Bedside Swallow Evaluation Previous Swallow Assessment: MBS see history Diet Prior to this Study: NPO Temperature Spikes Noted: No History of Recent Intubation: No Behavior/Cognition: Alert;Cooperative;Requires cueing Oral Care Completed by SLP: No Oral Cavity - Dentition: Edentulous Vision: Impaired for self-feeding Self-Feeding Abilities: Total assist Patient Positioning: Upright in bed Baseline Vocal Quality: Not observed Volitional Cough: Cognitively unable to elicit Volitional Swallow: Unable to elicit    Oral/Motor/Sensory Function Overall Oral Motor/Sensory Function: Severe impairment Facial ROM: Reduced right;Suspected CN VII (facial) dysfunction Facial Symmetry: Abnormal symmetry right;Suspected CN VII (facial) dysfunction Facial Strength: Reduced right;Suspected CN VII (facial) dysfunction Lingual ROM: Suspected CN XII (hypoglossal) dysfunction;Reduced right;Reduced left Lingual Symmetry: Abnormal symmetry right Lingual Strength: Within Functional Limits Velum: Other (comment)(Did not produce voice) Mandible: Within Functional Limits   Ice Chips Ice chips: Not tested   Thin Liquid Thin Liquid: Impaired Presentation: Cup Oral Phase Impairments: Reduced lingual movement/coordination;Reduced labial seal Oral Phase Functional Implications: Right anterior spillage;Left anterior spillage Pharyngeal  Phase Impairments: Wet Vocal Quality;Suspected delayed Swallow;Cough - Delayed    Nectar Thick Nectar Thick Liquid: Not tested   Honey Thick Honey Thick Liquid: Not tested   Puree Puree: Impaired Pharyngeal Phase Impairments: Cough - Delayed;Unable to trigger swallow   Solid   GO   Solid: Not tested        Gwyn Mehring, Katherene Ponto 01/01/2017,11:31 AM

## 2017-01-01 NOTE — Consult Note (Signed)
[place] Nurse wound consult note Reason for Consult:RLE ulcer, partial thickness Wound type:venous insufficiency Pressure Injury POA: NA, but present on adm Measurement:2cm x 0.8cm x 0.1cm Wound bed:90% pink, 10% yellow fibrin Drainage (amount, consistency, odor) scant yellow exudate, no odor Periwound: intact Dressing procedure/placement/frequency: I have provided nurses with orders for Cleanse wound on right posterior calf with NS, pat dry, apply NS moistened 2x2, dry gauze, wrap with kerlix, adhere kerlix to kerlix, no tape on skin, perform BID. We will not follow, but will remain available to this patient, to nursing, and the medical and/or surgical teams.  Please re-consult if we need to assist further.   Fara Olden, RN-C, WTA-C, [place] Wound Treatment Associate Ostomy Care Associate

## 2017-01-01 NOTE — Consult Note (Signed)
NEURO HOSPITALIST CONSULT NOTE   Requestig physician: Dr. Ashok Cordia   Reason for Consult: Abnormal left arm and leg movement post cardiac arrest   History obtained from:  Patient   Chart  HPI:                                                                                                                                         During of history was obtained from the chart as patient is mute.   [name] [name] is an 76 y.o. 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 Patient also has recent diagnosis of breast cancer. Patient become unresponsive the date of admission while traveling with her grandson. Upon EMS arrival patient was in ventricular fibrillation. She underwent cardioversion with one shock. Patient was found to have septal ST elevation on EKG with diffuse ST depression. Given the patient's multiple medical comorbidities the decision was made to continue with medical care."  He was recently extubated and upon extubation and watching the patient pulmonary critical care noted that she was moving her left arm and leg a lot however it was not rhythmic and she was able to follow commands.  They did not think it was seizure however they were concerned that possibly there might be of another stroke in addition family really wanted neurology to evaluate her.  Upon entering the room patient was not moving and resting well.  Upon awakening her she was writhing in the bed with her left arm and leg.  It was not rhythmic and it did not look like the hemiballism.  She was able to hold her left arm up in the air and at that time the abnormal movements did stop.  In addition upon having her place her left arm on her chest and holding my hand on that she was able to stop the movement.  When asking her if this was due to pain she gave me a thumbs up.  I drew a picture of a person and she pointed with a  pen at her left leg.  Apparently she has not been getting her Neurontin or pain medication as she is n.p.o.  MRI would have been considered however at this time she will not stay still for MRI.  Past Medical History:  Diagnosis Date  . Angina   . CHF (congestive heart failure) ([place])   . Complication of anesthesia    lung   . Diabetes mellitus ([place])   . GERD (gastroesophageal reflux disease)   . H/O hiatal hernia   . Hypercholesteremia   . Hypertension   . Myocardial infarction [facility])    CABG 2001, ? stent. Cath 02/2011 with new distal LIMA-LAD 80%. SVG-OM occlusion is old, rest  of grafts patent  . Peripheral vascular disease ([place])   . Stroke [facility]) 2002 and 2003   Resultant right hemiparesis and aphasia. Communicates by writing     Past Surgical History:  Procedure Laterality Date  . ABDOMINAL HYSTERECTOMY    . BREAST BIOPSY    . BREAST CYST EXCISION Left    years ago per daughter  . CARDIAC CATHETERIZATION  02/2011  . CORONARY ARTERY BYPASS GRAFT      Family History  Problem Relation Age of Onset  . Heart disease Mother   . Stroke Father   . Diabetes Sister   . Diabetes Brother   . Anesthesia problems Neg Hx   . Hypotension Neg Hx   . Malignant hyperthermia Neg Hx   . Pseudochol deficiency Neg Hx       Social History:  reports that  has never smoked. she has never used smokeless tobacco. She reports that she does not drink alcohol or use drugs.  Allergies  Allergen Reactions  . Penicillins Swelling    Swelling of mouth Has patient had a PCN reaction causing immediate rash, facial/tongue/throat swelling, SOB or lightheadedness with hypotension: YES Has patient had a PCN reaction causing severe rash involving mucus membranes or skin necrosis: NO Has patient had a PCN reaction that required hospitalization NO Has patient had a PCN reaction occurring within the last 10 years: NO If all of the above answers are "NO", then may proceed with Cephalosporin use.     MEDICATIONS:                                                                                                                     Prior to Admission:  Medications Prior to Admission  Medication Sig Dispense Refill Last Dose  . anastrozole (ARIMIDEX) 1 MG tablet Take 1 tablet (1 mg total) by mouth daily. 90 tablet 4 12/29/2016 at Unknown time  . cholecalciferol (VITAMIN D) 1000 units tablet Take 1,000 Units by mouth daily.   12/29/2016 at Unknown time  . clopidogrel (PLAVIX) 75 MG tablet Take 75 mg by mouth daily.     12/29/2016 at 0700  . escitalopram (LEXAPRO) 20 MG tablet Take 20 mg by mouth daily.   12/29/2016 at Unknown time  . furosemide (LASIX) 40 MG tablet Take 1 tablet (40 mg total) by mouth 2 (two) times daily. 60 tablet 0 12/29/2016 at Unknown time  . gabapentin (NEURONTIN) 100 MG capsule Take 1 capsule (100 mg total) by mouth 3 (three) times daily. 90 capsule 3 12/29/2016 at Unknown time  . insulin aspart (NOVOLOG) 100 UNIT/ML injection Inject 10 Units into the skin 3 (three) times daily with meals.   12/29/2016 at Unknown time  . insulin glargine (LANTUS) 100 UNIT/ML injection Inject 0.2 mLs (20 Units total) into the skin daily. (Patient taking differently: Inject 20 Units at bedtime into the skin. ) 10 mL 11 12/28/2016 at Unknown time  . lidocaine (XYLOCAINE) 5 % ointment Apply 1 application topically 2 (two) times daily  as needed. 150 g 1 unk at unk  . lisinopril (PRINIVIL,ZESTRIL) 5 MG tablet Take 1 tablet (5 mg total) by mouth daily. 30 tablet 6 12/29/2016 at Unknown time  . nitroGLYCERIN (NITROSTAT) 0.4 MG SL tablet Place 0.4 mg under the tongue every 5 (five) minutes as needed for chest pain.   unk at [employer]  . potassium chloride SA (K-DUR,KLOR-CON) 20 MEQ tablet Take 1 tablet (20 mEq total) by mouth daily. 30 tablet 1 12/29/2016 at Unknown time  . rosuvastatin (CRESTOR) 40 MG tablet Take 40 mg by mouth daily.   12/29/2016 at Unknown time  . metoprolol tartrate (LOPRESSOR) 25 MG tablet Take  0.5 tablets (12.5 mg total) by mouth 2 (two) times daily. (Patient not taking: Reported on 12/29/2016) 60 tablet 6 Not Taking at 0700   Scheduled: . aspirin  325 mg Oral Daily  . atorvastatin  40 mg Oral q1800  . Chlorhexidine Gluconate Cloth  6 each Topical Daily  . clopidogrel  75 mg Oral Daily  . heparin subcutaneous  5,000 Units Subcutaneous Q8H  . insulin aspart  0-9 Units Subcutaneous Q4H  . mouth rinse  15 mL Mouth Rinse BID  . metoprolol tartrate  2.5 mg Intravenous Q6H  . sodium chloride flush  10-40 mL Intracatheter Q12H   Continuous: . dextrose 5 % and 0.45% NaCl 25 mL/hr at 01/01/17 1109  . magnesium sulfate 1 - 4 g bolus IVPB    . potassium chloride Stopped (01/01/17 1048)     ROS:                                                                                                                                       History obtained from unobtainable from patient due to Patient is mute     Blood pressure 122/68, pulse 73, temperature 98.7 F (37.1 C), temperature source Oral, resp. rate (!) 21, height 5\' 2"  (1.575 m), weight 72.3 kg (159 lb 6.3 oz), SpO2 96 %.   Neurologic Examination:                                                                                                      HEENT-  Normocephalic, no lesions, without obvious abnormality.  Normal external eye and conjunctiva.  Normal TM's bilaterally.  Normal auditory canals and external ears. Normal external nose, mucus membranes and septum.  Normal pharynx. Cardiovascular- S1, S2 normal, pulses palpable throughout   Lungs- chest clear, no wheezing, rales, normal symmetric air entry Abdomen-  normal findings: bowel sounds normal Extremities- no edema Lymph-no adenopathy palpable Musculoskeletal-no joint tenderness, deformity or swelling Skin-warm and dry intact in the upper extremities with lower extremities showing hardening of skin and hemosiderin staining  Neurological Examination Mental Status: Alert,  writhing in the bed on the left side and looks uncomfortable.  Nonvocal.  Able to follow simple commands with her left arm and leg  cranial Nerves: II: Blinks to threat bilaterally III,IV, VI: ptosis noted in the right eye, extra-ocular motions intact bilaterally pupils equal, round, reactive to light and accommodation V,VII: Right facial droop, she has me a thumbs down with sensation on the right portion of her face VIII: hearing normal bilaterally IX,X: uvula rises symmetrically XI: bilateral shoulder shrug XII: midline tongue extension Motor: Flaccid right hemiparesis with full strength on the left arm and leg, involuntary non rhythmic choreoform movements in left arm Sensory: Sensation decreased on the right side Deep Tendon Reflexes: Deep tendon reflexes are depressed throughout Plantars: Right: Upgoing   left: downgoing Cerebellar: Finger to nose is normal on the left unable to appreciate on the right Gait: Not tested      Lab Results: Basic Metabolic Panel: Recent Labs  Lab 12/29/16 1811 12/30/16 0425 12/30/16 1418 12/30/16 1447 12/31/16 0231 01/01/17 0459 01/01/17 0929  NA 139 138 137  --  136 138  --   K 3.2* 3.3* 4.1  --  3.6 3.4*  --   CL 105 105 105  --  106 105  --   CO2 23 23 26   --  24 26  --   GLUCOSE 241* 246* 192*  --  190* 201*  --   BUN 20 20 17   --  12 9  --   CREATININE 1.41* 1.20* 1.07*  --  0.92 0.91  --   CALCIUM 8.4* 8.6* 8.7*  --  8.8* 8.9  --   MG 1.7  --   --  1.7 1.9  --  1.5*  PHOS 4.5  --   --   --  2.5  --   --     Liver Function Tests: Recent Labs  Lab 12/29/16 1811 12/31/16 0231  AST 265* 46*  ALT 158* 78*  ALKPHOS 91 57  BILITOT 0.7 0.9  PROT 6.7 5.9*  ALBUMIN 3.3* 2.9*   No results for input(s): LIPASE, AMYLASE in the last 168 hours. No results for input(s): AMMONIA in the last 168 hours.  CBC: Recent Labs  Lab 12/29/16 1200 12/30/16 0425 12/31/16 0231 01/01/17 0459  WBC  --  12.5* 9.7 6.3  HGB 16.0* 12.3 11.0*  9.4*  HCT 47.0* 39.3 34.7* 31.4*  MCV  --  85.1 85.0 87.2  PLT  --  174 123* 102*    Cardiac Enzymes: Recent Labs  Lab 12/29/16 1245 12/29/16 1811 12/30/16 0058  TROPONINI 0.05* <0.03 1.11*    Lipid Panel: Recent Labs  Lab 12/29/16 1813 12/30/16 0425  CHOL  --  149  TRIG 138 54  HDL  --  48  CHOLHDL  --  3.1  VLDL  --  11  LDLCALC  --  90    CBG: Recent Labs  Lab 12/31/16 0801 12/31/16 1932 01/01/17 0032 01/01/17 0431 01/01/17 0811  GLUCAP 201* 160* 168* 195* 188*    Microbiology: Results for orders placed or performed during the hospital encounter of 12/29/16  MRSA PCR Screening     Status: None   Collection Time: 12/29/16  3:24 PM  Result Value Ref  Range Status   MRSA by PCR NEGATIVE NEGATIVE Final    Comment:        The GeneXpert MRSA Assay (FDA approved for NASAL specimens only), is one component of a comprehensive MRSA colonization surveillance program. It is not intended to diagnose MRSA infection nor to guide or monitor treatment for MRSA infections. Performed at [facility], [place] [address], [place], [place] [address]     Coagulation Studies: No results for input(s): LABPROT, INR in the last 72 hours.  Imaging: Dg Chest Port 1 View  Result Date: 12/31/2016 CLINICAL DATA:  Respiratory failure EXAM: PORTABLE CHEST 1 VIEW COMPARISON:  Chest radiograph 12/29/2016 FINDINGS: The endotracheal tube tip is 2.5 cm above the inferior margin of the carina. The central venous catheter tip remains in the lower SVC. CABG markers and median sternotomy wires are unchanged. No focal airspace consolidation or pulmonary edema. Pulmonary edema has decreased from the prior study. No pneumothorax or sizable pleural effusion. Orogastric tube tip and side port are below the diaphragm. IMPRESSION: 1. Endotracheal tube tip 2.5 cm above the inferior carina. This could be retracted by approximately 2-3 cm for optimal positioning. 2. Improved aeration of  the lungs. Electronically Signed   By: Ulyses Jarred M.D.   On: 12/31/2016 03:58   Dg Foot Complete Left  Result Date: 01/01/2017 CLINICAL DATA:  76 year old female with left heel pain. EXAM: LEFT FOOT - COMPLETE 3+ VIEW COMPARISON:  02/11/2012 FINDINGS: There is no evidence of fracture or dislocation. Note is made of diffuse osteopenia. There is no evidence of arthropathy or other focal bone abnormality. Soft tissues are unremarkable. IMPRESSION: No acute osseous abnormality. Electronically Signed   By: Kristopher Oppenheim M.D.   On: 01/01/2017 10:47   US Abdomen Limited Ruq  Result Date: 12/30/2016 CLINICAL DATA:  Unspecified disorder of liver function, transaminitis. EXAM: ULTRASOUND ABDOMEN LIMITED RIGHT UPPER QUADRANT COMPARISON:  08/15/2008 CT FINDINGS: Gallbladder: No gallstones or wall thickening visualized. Biliary sludge is noted along the dependent wall. No sonographic Murphy sign noted by sonographer. Common bile duct: Diameter: 8.1 mm in caliber, within normal limits for age. No choledocholithiasis. Liver: No focal lesion identified. Mild intrahepatic ductal dilatation. Within normal limits in parenchymal echogenicity. Portal vein is patent on color Doppler imaging with normal direction of blood flow towards the liver. Other: Mild increase in echogenicity of the adjacent right kidney, nonspecific but may reflect medical renal disease. IMPRESSION: 1. Layering biliary sludge without secondary signs of acute cholecystitis. 2. Mild ectasia of the intra and extrahepatic biliary system, likely within normal limits for age. 3. Slight increase in right renal cortical echogenicity that may reflect medical renal disease. No obstructive uropathy. Electronically Signed   By: Ashley Royalty M.D.   On: 12/30/2016 16:58       Assessment and plan per attending neurologist  Etta Quill PA-C [facility] [phone]  01/01/2017, 11:17 AM   Assessment/Plan:  76 year old female who unfortunately  suffered a cardiac event of V. fib and was cardioverted with 1 shock.  Patient has history of CVA with residual right-sided hemiparesis along with a aphasia and mute.  She is able to follow commands to the best of her ability and move her left side.  Noted left-sided frequent arm and leg movements which concerned the family and critical care that this might be secondary to a new stroke given her recent event.    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

## 2017-01-01 NOTE — Progress Notes (Signed)
Progress Note:   Patient will continue to be NPO per speech pending further assessment. However, patient would not likely tolerate Coretrack or NG tube for meds/feeds. Family has relayed to nursing team that they want her to be able to eat by mouth. Discussed with both neurology and Dr. Einar Gip. Dr. Einar Gip is aware that she cannot get Plavix without oral access at present, we will change ASA to rectal. Discussed case with palliative care, who we will consult to discuss goals of care, specifically around feeding (changing GOC vs OG vs PEG).   Ralene Ok, MD

## 2017-01-01 NOTE — Progress Notes (Signed)
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  She also has a recent diagnosis of breast cancer. She was riding a bus with her grandson today when she suddenly became unresponsive.  When EMS arrived she was in V. fib and she was cardioverted with 1 shock.  EKG on arrival in the department of emergency medicine showed septal ST elevation and diffuse ST depression.  She is well-known to Dr. Einar Gip of the cardiology service who feels that medical care only is the appropriate option for this patient in light of her multiple medical comorbidities.    SUBJECTIVE:  Patient tracks with eyes, unable to communicate (family reports this is new recent baseline per chart review). Shakes head no to pain.   VITAL SIGNS: BP 122/68   Pulse 73   Temp 99.1 F (37.3 C) (Oral)   Resp (!) 21   Ht 5\' 2"  (1.575 m)   Wt 159 lb 6.3 oz (72.3 kg)   SpO2 96%   BMI 29.15 kg/m   HEMODYNAMICS:    VENTILATOR SETTINGS: Vent Mode: PRVC FiO2 (%):  [50 %] 50 % Set Rate:  [21 bmp] 21 bmp Vt Set:  [400 mL] 400 mL PEEP:  [5 cmH20] 5 cmH20 Plateau Pressure:  [15 cmH20] 15 cmH20  INTAKE / OUTPUT: I/O last 3 completed shifts: In: 1084.1 [I.V.:1084.1] Out: 375 [Urine:375]  PHYSICAL EXAMINATION: General: Elderly female currently on 2L [place], in NAD HEENT: Normocephalic atraumatic, no jugular venous distention, mucous membranes are moist.   Pulmonary: coarse breath sounds throughout, good air movement, easy WOB Cardiac: Regular rate and rhythm without murmur rub or gallop Abdomen: Soft nontender, positive bowel sounds. extremities/musculoskeletal: Warm, dry, no significant edema, brisk cap refill, strong  pulses. Neuro: Awake, follows commands, right-sided hemiplegia  LABS:  BMET Recent Labs  Lab 12/30/16 1418 12/31/16 0231 01/01/17 0459  NA 137 136 138  K 4.1 3.6 3.4*  CL 105 106 105  CO2 26 24 26   BUN 17 12 9   CREATININE 1.07* 0.92 0.91  GLUCOSE 192* 190* 201*    Electrolytes Recent Labs  Lab 12/29/16 1811  12/30/16 1418 12/30/16 1447 12/31/16 0231 01/01/17 0459  CALCIUM 8.4*   < > 8.7*  --  8.8* 8.9  MG 1.7  --   --  1.7 1.9  --   PHOS 4.5  --   --   --  2.5  --    < > = values in this interval not displayed.    CBC Recent Labs  Lab 12/30/16 0425 12/31/16 0231 01/01/17 0459  WBC 12.5* 9.7 6.3  HGB 12.3 11.0* 9.4*  HCT 39.3 34.7* 31.4*  PLT 174 123* 102*   Coag's No results for input(s): APTT, INR in the last 168 hours.  Sepsis Markers Recent Labs  Lab 12/30/16 1447 12/30/16 1955 12/31/16 0234  LATICACIDVEN 1.0 1.0 0.9    ABG Recent Labs  Lab 12/29/16 1751 12/30/16 0330 12/31/16 0350  PHART 7.334* 7.389 7.361  PCO2ART 42.4 41.2 46.3  PO2ART 53.0* 394* 198*    Liver Enzymes Recent Labs  Lab 12/29/16 1811 12/31/16 0231  AST 265* 46*  ALT 158* 78*  ALKPHOS 91 57  BILITOT 0.7 0.9  ALBUMIN 3.3* 2.9*    Cardiac Enzymes Recent Labs  Lab 12/29/16 1245 12/29/16 1811 12/30/16 0058  TROPONINI 0.05* <0.03 1.11*    Glucose Recent Labs  Lab 12/31/16 0007 12/31/16 0340 12/31/16 0801 12/31/16 1932 01/01/17 0032 01/01/17 0431  GLUCAP 154* 155* 201* 160* 168* 195*    Imaging No results found.  STUDIES:  CT head 11/5: no acute intracranial abnormality. Advanced chronic small vessel disease with mild progression suspected in the deep gray matter since 2014. RUQ [place]: Layering biliary sludge without secondary signs of acute cholecystitis. Mild ectasia of the intra and extrahepatic biliary system, likely within normal limits for age. Slight increase in right renal cortical echogenicity that may reflect medical renal disease. No  obstructive uropathy.  DISCUSSION: This is a 76 year old diabetic with a history of prior CABG and persistent hemic chest pain suffered an out of hospital V. fib arrest earlier today. She received 1 shock in the field and converted to sinus rhythm.  Initial EKG showed ST elevation in V1 and diffuse ST segment depression.  She had a torsades arrest 12/30/2016.  She has made remarkable neurological recovery. Extubated 11/7.   ASSESSMENT / PLAN:  Acute respiratory failure secondary to cardiac arrest x2 Stable s/p extubation.  Plan Continue nasal cannula for sats >92% Aspiration precautions Pulse oximetry  Post V. fib arrest on 12/29/2016 Torsades with subsequent arrest on 12/30/2016 Significant cardiac history of congestive heart failure coronary bypass grafting. Heparin d/c'd 11/7 as not planning cath.  Plan Continue telemetry monitoring Continue medical therapy with aspirin and Plavix as well as scheduled beta-blockade Ensure normal electrolytes  Acute encephalopathy, concern for ischemic encephalopathy following cardiac arrest Dense right hemiplegia from previous stroke 3 years ago She seems to have had remarkable neurological recovery.  Following commands Plan Continue supportive care  Anemia of chronic disease, s/p some increased bleeding from art line 11/7. Hgb 11.0 > 9.4. Heparin stopped.  Plan Trend CBC  Aspiration risk - NPO at present, family reports coughing with eating at home. Plan SLP eval ordered  DM poorly controlled with HgbA1C >9 Plan Sliding scale insulin  Deconditioning - recent CVA, reported R sided paralysis 2/2 to such. Would benefit from PT and OOB. Patient nonverbal per family, may benefit from speech cards or tablet to communicate. PT/OT eval and treat  Dispo: transfer to tele 11/8.  Family: no family at bedside during my exam.   Ralene Ok, MD PGY 2, Family Medicine

## 2017-01-01 NOTE — Evaluation (Signed)
Physical Therapy Evaluation Patient Details Name: [name] [name] MRN: [id number] DOB: Aug 27, 1940 Today's Date: 01/01/2017   History of Present Illness  Pt is a 76 y.o. female presenting after becoming unresponsive and was found to be in cardiac arrest. Pt was intubated 11/5-11/7. 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  Clinical Impression  Pt presented supine in bed with HOB elevated, awake and willing to participate in therapy session. [name] present throughout and providing all history information. Prior to admission, pt used a power w/c for mobility and required assistance for transfers. Pt currently requires max A x2 for bed mobility and transfers at this time. Pt would continue to benefit from skilled physical therapy services at this time while admitted and after d/c to address the below listed limitations in order to improve overall safety and independence with functional mobility.  All VSS throughout with pt on RA. At end of session, SPO2 in low to mid 90's. Pt's RN was notified.     Follow Up Recommendations SNF    Equipment Recommendations  None recommended by PT    Recommendations for Other Services       Precautions / Restrictions Precautions Precautions: Fall Restrictions Weight Bearing Restrictions: No      Mobility  Bed Mobility Overal bed mobility: Needs Assistance Bed Mobility: Supine to Sit     Supine to sit: Max assist;+2 for physical assistance     General bed mobility comments: Assist for LEs to EOB and trunk elevation to sitting. Pt initiating movement and attempting to grip bed rail with LUE to pull into sitting.  Transfers Overall transfer level: Needs assistance Equipment used: 2 person hand held assist Transfers: Squat Pivot Transfers     Squat pivot transfers: Max assist;+2 physical assistance     General transfer comment: Face to face method  with use of bed pad and gait belt. Blocked bil knees/feet for pivot EOB>chair toward L side. Attempted sit<>stand from EOB and from recliner chair, however, pt unable to achieve full erect standing position either time  Ambulation/Gait                Stairs            Wheelchair Mobility    Modified Rankin (Stroke Patients Only)       Balance Overall balance assessment: Needs assistance Sitting-balance support: Feet unsupported Sitting balance-Leahy Scale: Fair Sitting balance - Comments: min guard for sitting balance at EOB                                     Pertinent Vitals/Pain Pain Assessment: Faces Faces Pain Scale: No hurt    Home Living Family/patient expects to be discharged to:: Private residence Living Arrangements: Other relatives(grandson) Available Help at Discharge: Family;Personal care attendant;Available PRN/intermittently Type of Home: Apartment Home Access: Level entry     Home Layout: One level Home Equipment: Hospital bed;Shower seat;Cane - quad;Grab bars - toilet;Wheelchair - power;Bedside commode 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    Prior Function Level of Independence: Needs assistance   Gait / Transfers Assistance Needed: occasional assist for transfers only to power w/c  ADL's / Homemaking Assistance Needed: aide assisting with bathing/dressing, grandson takes care of household chores  Comments: All information obtained from daughter at [place]  Hand: Left    Extremity/Trunk Assessment   Upper Extremity Assessment Upper Extremity Assessment: Defer to OT evaluation RUE Deficits / Details: flaccid from prior CVA LUE Deficits / Details: ataxic movements but does move purposefully with functional tasks. At least 3/5.    Lower Extremity Assessment Lower Extremity Assessment: RLE deficits/detail;LLE deficits/detail RLE Deficits / Details: hemiparetic at baseline  due to prior CVA, no active movement, able to tolerate partial weight bearing during transfers with therapist blocking knee LLE Deficits / Details: ataxic, restless movement consistently at rest but with purposeful movement pt moving L LE with fair control and functional strength (3/5)    Cervical / Trunk Assessment Cervical / Trunk Assessment: Kyphotic  Communication   Communication: Expressive difficulties;Other (comment)(nonverbal at baseline secondary to prior CVA)  Cognition Arousal/Alertness: Awake/alert Behavior During Therapy: Restless Overall Cognitive Status: Difficult to assess                                 General Comments: Pt following one step commands consistently and appropriatley responding to questions via head nods.      General Comments General comments (skin integrity, edema, etc.): VSS throughout. SpO2 in low 90s on RA during mobility.    Exercises     Assessment/Plan    PT Assessment Patient needs continued PT services  PT Problem List Decreased strength;Decreased balance;Decreased mobility;Decreased coordination;Decreased knowledge of use of DME;Decreased safety awareness;Decreased knowledge of precautions;Cardiopulmonary status limiting activity       PT Treatment Interventions DME instruction;Gait training;Stair training;Functional mobility training;Therapeutic exercise;Therapeutic activities;Balance training;Neuromuscular re-education;Patient/family education    PT Goals (Current goals can be found in the Care Plan section)  Acute Rehab PT Goals Patient Stated Goal: per daughter return to PLOF; pt very focused on getting something to drink at end of session PT Goal Formulation: With patient/family Time For Goal Achievement: 01/15/17 Potential to Achieve Goals: Fair    Frequency Min 2X/week   Barriers to discharge        Co-evaluation PT/OT/SLP Co-Evaluation/Treatment: Yes Reason for Co-Treatment: Complexity of the patient's  impairments (multi-system involvement);For patient/therapist safety;To address functional/ADL transfers PT goals addressed during session: Mobility/safety with mobility;Balance;Strengthening/ROM OT goals addressed during session: ADL's and self-care       AM-PAC PT "6 Clicks" Daily Activity  Outcome Measure Difficulty turning over in bed (including adjusting bedclothes, sheets and blankets)?: Unable Difficulty moving from lying on back to sitting on the side of the bed? : Unable Difficulty sitting down on and standing up from a chair with arms (e.g., wheelchair, bedside commode, etc,.)?: Unable Help needed moving to and from a bed to chair (including a wheelchair)?: A Lot Help needed walking in hospital room?: Total Help needed climbing 3-5 steps with a railing? : Total 6 Click Score: 7    End of Session Equipment Utilized During Treatment: Gait belt Activity Tolerance: Patient tolerated treatment well Patient left: in chair;with call bell/phone within reach;with chair alarm set;with family/visitor present Nurse Communication: Mobility status;Need for lift equipment PT Visit Diagnosis: Other abnormalities of gait and mobility (R26.89)    Time: 4008-6761 PT Time Calculation (min) (ACUTE ONLY): 30 min   Charges:   PT Evaluation $PT Eval Moderate Complexity: 1 Mod     PT G Codes:        [place], PT, DPT [place] 01/01/2017, 3:36 PM

## 2017-01-01 NOTE — Evaluation (Signed)
Occupational Therapy Evaluation Patient Details Name: Chanise Habeck MRN: [id number] DOB: 25-Jan-1941 Today's Date: 01/01/2017    History of Present Illness Pt is a 76 y.o. female presenting after becoming unresponsive and was found to be in cardiac arrest. 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   Clinical Impression   Per daughter, pt required assist from her caregiver with ADL PTA and occasionally required assist with transfers to her power w/c. Currently pt requires max assist +2 for squat pivot transfers and max-total assist for ADL. VSS throughout with SpO2 in low to mid 90s on RA during session. Pt presenting with baseline R sided weakness, LUE ataxic movements, impaired communication, ?impaired cognition, poor balance, and decreased activity tolerance impacting her independence and safety with ADL and functional mobility. Recommending SNF for follow up to maximize independence and safety with ADL and functional mobility prior to return home. Pt would benefit from continued skilled OT to address established goals.    Follow Up Recommendations  SNF;Supervision/Assistance - 24 hour    Equipment Recommendations  Tub/shower bench    Recommendations for Other Services       Precautions / Restrictions Precautions Precautions: Fall Restrictions Weight Bearing Restrictions: No      Mobility Bed Mobility Overal bed mobility: Needs Assistance Bed Mobility: Supine to Sit     Supine to sit: Max assist;+2 for physical assistance     General bed mobility comments: Assist for LEs to EOB and trunk elevation to sitting. Pt initiating movement and attempting to grip bed rail with LUE to pull into sitting.  Transfers Overall transfer level: Needs assistance Equipment used: 2 person hand held assist Transfers: Squat Pivot Transfers     Squat pivot transfers: Max assist;+2 physical assistance      General transfer comment: Face to face method with use of bed pad and gait belt. Blocked bil knees/feet for pivot EOB>chair toward L side.    Balance Overall balance assessment: Needs assistance Sitting-balance support: Single extremity supported;Feet unsupported Sitting balance-Leahy Scale: Good Sitting balance - Comments: min guard for sitting balance at EOB                                   ADL either performed or assessed with clinical judgement   ADL Overall ADL's : Needs assistance/impaired Eating/Feeding: NPO   Grooming: Maximal assistance;Sitting   Upper Body Bathing: Maximal assistance;Sitting   Lower Body Bathing: Total assistance;+2 for physical assistance;Sit to/from stand   Upper Body Dressing : Maximal assistance;Sitting   Lower Body Dressing: Total assistance;+2 for physical assistance;Sit to/from stand   Toilet Transfer: Maximal assistance;+2 for physical assistance;Squat-pivot Toilet Transfer Details (indicate cue type and reason): Simulated by squat pivot EOB>chair         Functional mobility during ADLs: Maximal assistance;+2 for physical assistance(for squat pivot only)       Vision   Additional Comments: Difficult to assess due to impaired communication. Disconjugate gaze noted.     Perception     Praxis      Pertinent Vitals/Pain Pain Assessment: No/denies pain     Hand Dominance Left   Extremity/Trunk Assessment Upper Extremity Assessment Upper Extremity Assessment: RUE deficits/detail;LUE deficits/detail RUE Deficits / Details: flaccid from prior CVA LUE Deficits / Details: ataxic movements but does move purposefully with functional tasks. At least 3/5.   Lower Extremity Assessment Lower Extremity  Assessment: Defer to PT evaluation   Cervical / Trunk Assessment Cervical / Trunk Assessment: Kyphotic   Communication Communication Communication: Expressive difficulties(non verbal since prior CVA)   Cognition  Arousal/Alertness: Awake/alert Behavior During Therapy: Restless Overall Cognitive Status: Difficult to assess                                 General Comments: Pt following one step commands consistently and appropriatley responding to questions via head nods.   General Comments  VSS throughout. SpO2 in low 90s on RA during mobility.    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      Prior Functioning/Environment Level of Independence: Needs assistance  Gait / Transfers Assistance Needed: occasional assist for transfers only to power w/c ADL's / Homemaking Assistance Needed: aide assisting with bathing/dressing, grandson takes care of household chores   Comments: All information obtained from daughter at bedside        OT Problem List: Decreased strength;Decreased range of motion;Decreased activity tolerance;Impaired balance (sitting and/or standing);Impaired vision/perception;Decreased coordination;Decreased cognition;Decreased safety awareness;Decreased knowledge of use of DME or AE;Cardiopulmonary status limiting activity;Impaired sensation;Obesity;Impaired tone;Impaired UE functional use;Increased edema      OT Treatment/Interventions: Self-care/ADL training;Therapeutic exercise;Neuromuscular education;Energy conservation;DME and/or AE instruction;Therapeutic activities;Cognitive remediation/compensation;Visual/perceptual  remediation/compensation;Patient/family education;Balance training    OT Goals(Current goals can be found in the care plan section) Acute Rehab OT Goals Patient Stated Goal: get something to drink OT Goal Formulation: With patient Time For Goal Achievement: 01/15/17 Potential to Achieve Goals: Good ADL Goals Pt Will Perform Grooming: with min assist;sitting Pt Will Perform Upper Body Bathing: with min assist;sitting Pt Will Perform Lower Body Bathing: with mod assist;sitting/lateral leans Pt Will Transfer to Toilet: with min assist;stand pivot transfer;bedside commode Pt Will Perform Toileting - Clothing Manipulation and hygiene: with min assist;sitting/lateral leans  OT Frequency: Min 2X/week   Barriers to D/C: Decreased caregiver support  family/caregiver only able to provide intermittent supervision       Co-evaluation PT/OT/SLP Co-Evaluation/Treatment: Yes Reason for Co-Treatment: Complexity of the patient's impairments (multi-system involvement);For patient/therapist safety   OT goals addressed during session: ADL's and self-care      AM-PAC PT "6 Clicks" Daily Activity     Outcome Measure Help from another person eating meals?: Total Help from another person taking care of personal grooming?: A Lot Help from another person toileting, which includes using toliet, bedpan, or urinal?: Total Help from another person bathing (including washing, rinsing, drying)?: Total Help from another person to put on and taking off regular upper body clothing?: Total Help from another person to put on and taking off regular lower body clothing?: Total 6 Click Score: 7   End of Session Equipment Utilized During Treatment: Gait belt;Oxygen Nurse Communication: Mobility status  Activity Tolerance: Patient tolerated treatment well Patient left: in chair;with call bell/phone within reach;with chair alarm set;with family/visitor present  OT Visit Diagnosis: Unsteadiness on feet (R26.81);Other  abnormalities of gait and mobility (  R26.89);Muscle weakness (generalized) (M62.81);Hemiplegia and hemiparesis Hemiplegia - Right/Left: Right Hemiplegia - dominant/non-dominant: Non-Dominant                Time: 6122-4497 OT Time Calculation (min): 25 min Charges:  OT General Charges $OT Visit: 1 Visit OT Evaluation $OT Eval Moderate Complexity: 1 Mod G-Codes:     Mitzy Naron A. Ulice Brilliant, M.S., OTR/L Pager: [place] 01/01/2017, 2:53 PM

## 2017-01-01 NOTE — Consult Note (Signed)
Consultation Note Date: 01/01/2017   Patient Name: [name] [name]  DOB: 14-Jul-1940  MRN: [id number]  Age / Sex: 76 y.o., female  PCP: [name] Dy, MD Referring Physician: Sampson Goon, MD  Reason for Consultation: Establishing goals of care specifically with regard to feeding (o/g, PEG, or comfort feeds)  HPI/Patient Profile: 76 y.o. 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  Patient is currently in ICU and thru speech testing is found to be a severe aspiration risk.   She is currently NPO.   Clinical Assessment and Goals of Care:  I have reviewed medical records including EPIC notes, labs and imaging, received report from the care team, assessed the patient and then met at the bedside along with her daughter [name] [name] to discuss diagnosis and the issue of how she will receive nutrition.  I introduced Palliative Medicine as specialized medical care for people living with serious illness. It focuses on providing relief from the symptoms and stress of a serious illness. The goal is to improve quality of life for both the patient and the family.  We discussed a brief life review of the patient. She lives with her 2 grandsons who are not at home full time but do help with caring for her.  She has two dtrs.  [name] [name] lives locally.  She and her sister together are the patient's medical surrogate decision makers when one is needed.  [name] [name] does her best to allow her mother to make her own decisions.    As far as functional and nutritional status prior to admission she did have trouble eating.  Her breathing often sounded "wet" as it does now and she often coughed a bit while eating.  The patient took measures to compensate and eat more safely.  We talked at length about aspiration and the probability  of aspirating on her saliva as well as food. [name] [name] understands.   The patient wrote thru out our conversation contributing her thoughts.  She wrote more than once.  I want to try to eat normally and if I can't then I will take the tube (referring to an N/G tube in her nose).    I explained that she had been evaluated earlier in the day by Speech Therapy and we already knew that she was unable to eat safely.  The patient did not remember the evaluation.    [name] [name] asked if her mother could be re-evaluated with [name] [name] present.  [name] [name] is extremely reasonable and could explain things to the patient so that she would understand.  We discussed DNR code status as an avenue to "eating more normally" with comfort feedings - given that the patient is a severe aspiration risk.  Hospice and Palliative Care services outpatient were explained as an alternative if the patient opts for comfort measures rather than a feeding tube and more aggressive measures.  Questions and concerns were addressed.  Hard Choices booklet  left for review. The family was encouraged to call with questions or concerns.   Primary Decision Maker:  PATIENT with assistance from her two dtrs if necessary.  [name] [name] lives locally and is the most involved.    SUMMARY OF RECOMMENDATIONS    Repeat speech eval with [name] [name] present  PMT will continue to follow to help define choices and decisions.  Code Status/Advance Care Planning:  Full  Psycho-social/Spiritual:   Desire for further Chaplaincy support: yes requested.  Prognosis:  Likely months or less given vfib arrest and severe aspiration risk.  Patient is at risk for a sudden acute aspiration event.    Discharge Planning: To Be Determined      Primary Diagnoses: Present on Admission: . Cardiac arrest ([place])   I have reviewed the medical record, interviewed the patient and family, and examined the patient. The following aspects are pertinent.  Past Medical History:    Diagnosis Date  . Angina   . CHF (congestive heart failure) ([place])   . Complication of anesthesia    lung   . Diabetes mellitus ([place])   . GERD (gastroesophageal reflux disease)   . H/O hiatal hernia   . Hypercholesteremia   . Hypertension   . Myocardial infarction [facility])    CABG 2001, ? stent. Cath 02/2011 with new distal LIMA-LAD 80%. SVG-OM occlusion is old, rest of grafts patent  . Peripheral vascular disease ([place])   . Stroke [facility]) 2002 and 2003   Resultant right hemiparesis and aphasia. Communicates by writing    Social History   Socioeconomic History  . Marital status: Widowed    Spouse name: Not on file  . Number of children: Not on file  . Years of education: Not on file  . Highest education level: Not on file  Social Needs  . Financial resource strain: Not on file  . Food insecurity - worry: Not on file  . Food insecurity - inability: Not on file  . Transportation needs - medical: Not on file  . Transportation needs - non-medical: Not on file  Occupational History  . Not on file  Tobacco Use  . Smoking status: Never Smoker  . Smokeless tobacco: Never Used  Substance and Sexual Activity  . Alcohol use: No  . Drug use: No  . Sexual activity: No  Other Topics Concern  . Not on file  Social History Narrative  . Not on file   Family History  Problem Relation Age of Onset  . Heart disease Mother   . Stroke Father   . Diabetes Sister   . Diabetes Brother   . Anesthesia problems Neg Hx   . Hypotension Neg Hx   . Malignant hyperthermia Neg Hx   . Pseudochol deficiency Neg Hx    Scheduled Meds: . aspirin  300 mg Rectal Daily  . atorvastatin  40 mg Oral q1800  . Chlorhexidine Gluconate Cloth  6 each Topical Daily  . clopidogrel  75 mg Oral Daily  . heparin subcutaneous  5,000 Units Subcutaneous Q8H  . insulin aspart  0-9 Units Subcutaneous Q4H  . mouth rinse  15 mL Mouth Rinse BID  . metoprolol tartrate  2.5 mg Intravenous Q6H  . sodium chloride flush   10-40 mL Intracatheter Q12H   Continuous Infusions: . dextrose 5 % and 0.45% NaCl 25 mL/hr at 01/01/17 1109   PRN Meds:.sodium chloride flush Allergies  Allergen Reactions  . Penicillins Swelling    Swelling of mouth Has patient had a PCN reaction causing  immediate rash, facial/tongue/throat swelling, SOB or lightheadedness with hypotension: YES Has patient had a PCN reaction causing severe rash involving mucus membranes or skin necrosis: NO Has patient had a PCN reaction that required hospitalization NO Has patient had a PCN reaction occurring within the last 10 years: NO If all of the above answers are "NO", then may proceed with Cephalosporin use.   Review of Systems patient indicates her mouth is dry and she is having left leg pain.  Physical Exam  Elderly female, aphasic, continuous movement of her LLE. N/G in place. CV rrr Resp no distress on oxygen Abdomen soft, nt, nd  Vital Signs: BP (!) 85/57   Pulse 67   Temp 98.3 F (36.8 C) (Oral)   Resp (!) 24   Ht 5' 2"  (1.575 m)   Wt 72.3 kg (159 lb 6.3 oz)   SpO2 92%   BMI 29.15 kg/m  Pain Assessment: 0-10   Pain Score: 4    SpO2: SpO2: 92 % O2 Device:SpO2: 92 % O2 Flow Rate: .O2 Flow Rate (L/min): 2 L/min  IO: Intake/output summary:   Intake/Output Summary (Last 24 hours) at 01/01/2017 1532 Last data filed at 01/01/2017 1500 Gross per 24 hour  Intake 1427 ml  Output 50 ml  Net 1377 ml    LBM: Last BM Date: 12/30/16 Baseline Weight: Weight: 75.4 kg (166 lb 5 oz) Most recent weight: Weight: 72.3 kg (159 lb 6.3 oz)     Palliative Assessment/Data:30%     Time In: 1:00 Time Out: 2:05 Time Total:  65 min. Greater than 50%  of this time was spent counseling and coordinating care related to the above assessment and plan.  Signed by: Florentina Jenny, PA-C Palliative Medicine Pager: [phone]  Please contact Palliative Medicine Team phone at [phone] for questions and concerns.  For individual  provider: See Shea Evans

## 2017-01-01 NOTE — Progress Notes (Signed)
Subjective:  Patient is extubated, following all commands.  She is a 5 from prior stroke that is not new.  Objective:  Vital Signs in the last 24 hours: Temp:  [98.2 F (36.8 C)-99.3 F (37.4 C)] 98.2 F (36.8 C) (11/08 1554) Pulse Rate:  [59-106] 60 (11/08 1800) Resp:  [13-29] 23 (11/08 1800) BP: (79-146)/(19-117) 79/58 (11/08 1700) SpO2:  [92 %-100 %] 92 % (11/08 1800) Weight:  [72.3 kg (159 lb 6.3 oz)] 72.3 kg (159 lb 6.3 oz) (11/08 0500)  Intake/Output from previous day: 11/07 0701 - 11/08 0700 In: 753.3 [I.V.:753.3] Out: 215 [Urine:215] Intake/Output from this shift: Total I/O In: 857.8 [I.V.:395.8; IV Piggyback:462] Out: -   Physical Exam:  Constitutional: Chronically ill looking, who is unresponsive.  HENT: Head: Atraumatic.  Mouth/Throat: Excessive saliva secretion present. Cardiovascular:  S1 and S2 normal, S4 gallop heard.  Respiratory: Effort normal and breath sounds normal.  GI: Soft. Bowel sounds are normal.  Musculoskeletal: Chronically ischemic looking, no ulceration, thick skin, 2+ bilateral leg edema right worse than the left.  Vascular: Absent pedal pulse bilateral. Healed leg /ankle ulcer noted.   Lab Results: Recent Labs    12/31/16 0231 01/01/17 0459  WBC 9.7 6.3  HGB 11.0* 9.4*  PLT 123* 102*   Recent Labs    01/01/17 0459 01/01/17 1742  NA 138 139  K 3.4* 4.1  CL 105 108  CO2 26 26  GLUCOSE 201* 167*  BUN 9 9  CREATININE 0.91 0.84   Recent Labs    12/30/16 0058  TROPONINI 1.11*   Hepatic Function Panel Recent Labs    12/31/16 0231  PROT 5.9*  ALBUMIN 2.9*  AST 46*  ALT 78*  ALKPHOS 57  BILITOT 0.9   Recent Labs    12/30/16 0425  CHOL 149    Imaging: Dg Chest Port 1 View  Result Date: 12/31/2016 CLINICAL DATA:  Respiratory failure EXAM: PORTABLE CHEST 1 VIEW COMPARISON:  Chest radiograph 12/29/2016 FINDINGS: The endotracheal tube tip is 2.5 cm above the inferior margin of the carina. The central venous catheter  tip remains in the lower SVC. CABG markers and median sternotomy wires are unchanged. No focal airspace consolidation or pulmonary edema. Pulmonary edema has decreased from the prior study. No pneumothorax or sizable pleural effusion. Orogastric tube tip and side port are below the diaphragm. IMPRESSION: 1. Endotracheal tube tip 2.5 cm above the inferior carina. This could be retracted by approximately 2-3 cm for optimal positioning. 2. Improved aeration of the lungs. Electronically Signed   By: Ulyses Jarred M.D.   On: 12/31/2016 03:58   Dg Foot Complete Left  Result Date: 01/01/2017 CLINICAL DATA:  76 year old female with left heel pain. EXAM: LEFT FOOT - COMPLETE 3+ VIEW COMPARISON:  02/11/2012 FINDINGS: There is no evidence of fracture or dislocation. Note is made of diffuse osteopenia. There is no evidence of arthropathy or other focal bone abnormality. Soft tissues are unremarkable. IMPRESSION: No acute osseous abnormality. Electronically Signed   By: Kristopher Oppenheim M.D.   On: 01/01/2017 10:47    Cardiac Studies:  Echo 12/29/2016: Left ventricle:  The cavity size was normal. There was mild concentric hypertrophy. Systolic function was severely reduced. The estimated ejection fraction was in the range of 25% to 30%. Regional wall motion abnormalities:   Akinesis of the anterolateral myocardium.  Severe hypokinesis of the basal-midanterior myocardium. The study is not technically sufficient to allow evaluation of LV diastolic function.  EKG 12/31/2016: Normal sinus rhythm with rate  of 72 bpm, LVH with repolarization abnormality, cannot exclude inferior and anterolateral ischemia versus subendocardial infarct.  Compared to 12/29/2016, Global 3 mm ST segment depression not present.  Scheduled Meds: . aspirin  300 mg Rectal Daily  . atorvastatin  40 mg Oral q1800  . Chlorhexidine Gluconate Cloth  6 each Topical Daily  . clopidogrel  75 mg Per Tube Daily  . heparin subcutaneous  5,000 Units  Subcutaneous Q8H  . insulin aspart  0-9 Units Subcutaneous Q4H  . mouth rinse  15 mL Mouth Rinse BID  . metoprolol tartrate  2.5 mg Intravenous Q6H  . sodium chloride flush  10-40 mL Intracatheter Q12H   Continuous Infusions: . dextrose 5 % and 0.45% NaCl 25 mL/hr at 01/01/17 1800   PRN Meds:.sodium chloride flush   Assessment/Plan:  1. VT/ VF arrest due to NSTEMI 2.  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  History of stroke with dense right hemiplegia 4.  Hypertension 5.  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  Mild trouble sleeping in noted, switching from heparin to Lovenox, do not suspect HIT.  [place] and has been stable and serum creatinine has also remained stable.  Severe hypoalbuminemia noted, probably related to acute illness.  Drop in hemoglobin also evident will continue to trend this.  No indication for transfusion.  Medical therapy for coronary artery disease in view of multiple medical comorbidities.  I'll consider switching to Brilinta from Plavix once I know her platelets are stable   LOS: 3 days    [name] Prows, MD 01/01/2017, 6:34 PM [facility] Pager: [phone] Office: [phone] If no answer: Cell:  [phone]  01/01/2017, 09:00

## 2017-01-01 NOTE — Evaluation (Signed)
Speech Language Pathology Evaluation Patient Details Name: Renleigh Ouellet MRN: [id number] DOB: [birth year] Today's Date: 01/01/2017 Time: 5366-4403 SLP Time Calculation (min) (ACUTE ONLY): 33 min  Problem List:  Patient Active Problem List   Diagnosis Date Noted  . Cardiac arrest ([place]) 12/29/2016  . Malnutrition of moderate degree 07/15/2016  . Diastolic CHF ([place]) 47/42/5956  . Ankle ulcer, right, limited to breakdown of skin ([place]) 07/12/2016  . Malignant neoplasm of lower-outer quadrant of right breast of female, estrogen receptor positive ([place]) 05/28/2016  . Chronic combined systolic and diastolic CHF (congestive heart failure) ([place]) 12/01/2015  . Acute encephalopathy 12/01/2015  . Hypoglycemia 12/01/2015  . Elevated troponin 12/01/2015  . Diabetes mellitus without complication ([place])   . Hypothermia   . Rhonchi   . Acute respiratory failure ([place]) 01/28/2014  . Respiratory distress 01/27/2014  . Hypoxia 01/27/2014  . CHF exacerbation ([place]) 01/27/2014  . Acute on chronic systolic CHF (congestive heart failure) ([place]) 01/27/2014  . Systolic CHF, acute on chronic ([place]) 01/27/2014  . Increased anion gap metabolic acidosis 38/75/6433  . Right foot ulcer ([place]) 01/27/2014  . Aspiration pneumonia ([place]) 06/23/2012  . Dysphagia 06/21/2012  . Palliative care encounter 06/20/2012  . Healthcare-associated pneumonia 06/16/2012  . TIA (transient ischemic attack) 05/05/2012  . N&V (nausea and vomiting) 05/04/2012  . Weakness generalized 05/04/2012  . 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 NSTEMI (non-ST elevated myocardial infarction) ([place]) 07/27/2011  . Diabetes mellitus ([place])   . Chest pain 03/10/2011  . HTN (hypertension) 03/10/2011  . Cerebral infarction ([place]) 03/10/2011  . CAD (coronary artery disease), native coronary artery 03/10/2011  . 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 S/P PTCA (percutaneous transluminal coronary  angioplasty) 03/10/2011   Past Medical History:  Past Medical History:  Diagnosis Date  . Angina   . CHF (congestive heart failure) ([place])   . Complication of anesthesia    lung   . Diabetes mellitus ([place])   . GERD (gastroesophageal reflux disease)   . H/O hiatal hernia   . Hypercholesteremia   . Hypertension   . Myocardial infarction [facility])    CABG 2001, ? stent. Cath 02/2011 with new distal LIMA-LAD 80%. SVG-OM occlusion is old, rest of grafts patent  . Peripheral vascular disease ([place])   . Stroke [facility]) 2002 and 2003   Resultant right hemiparesis and aphasia. Communicates by writing    Past Surgical History:  Past Surgical History:  Procedure Laterality Date  . ABDOMINAL HYSTERECTOMY    . BREAST BIOPSY    . BREAST CYST EXCISION Left    years ago per daughter  . CARDIAC CATHETERIZATION  02/2011  . CORONARY ARTERY BYPASS GRAFT     HPI:  76 y.o.female with known history of type 2 diabetes mellitus, CABG, HTN, stroke with residual right-sided deficits and aphasia, &coronary artery disease. Patient also has recent diagnosis of breast cancer. Patient become unresponsive the date of admission while traveling with her grandson. Upon EMS arrival patient was in ventricular fibrillation. She underwent cardioversion with one shock; pt had cardiac arrest x2. Given the patient's multiple medical comorbidities the decision was made to continue with medical care. Pt intubated 11/5-11/7. Family reports coughing with eating at home; previous SLP notes show severe oral dysphagia at baseline with mild pharyngeal phase deficits.     Assessment / Plan / Recommendation Clinical Impression  Pt assessed for SLE simultaneously during BSE; pt is nonverbal at baseline. Pt attempted communication via writing; however,  writing illegible secondary to choreic-type movements of her LUE. Use of alternative means of communication in the form of alphabet communication board and yes/no board implemented; pt was able  to accurately point to letters on the communication board to form short phrases and answer yes/no questions during evaluation. Receptive/expressive language WNL; difficult to assess pt's cognition secondary to nonverbal status. Noted concern for cognitive deficits in the area of reasoning and/or memory marked by disregard of safety concerns with swallowing; pt continuously stated via communication board that she wanted water despite SLP educating her of aspiration precautions secondary to oral discoordination and recent extubation. SLP spoke with daughter following assessment via phone whom confirms that her presentation is different than baseline functioning. Recommend further acute SLP intervention targeting alternative communication devices and further cognitive diagnostic tx.     SLP Assessment  SLP Recommendation/Assessment: Patient needs continued Speech Lanaguage Pathology Services SLP Visit Diagnosis: Cognitive communication deficit (R41.841)    Follow Up Recommendations  Home health SLP;Other (comment)(TBD)    Frequency and Duration min 2x/week  2 weeks      SLP Evaluation Cognition  Overall Cognitive Status: Impaired/Different from baseline Arousal/Alertness: Awake/alert Orientation Level: Other (comment)(UTA) Attention: Sustained Sustained Attention: Appears intact Memory: Impaired Memory Impairment: Storage deficit;Retrieval deficit;Decreased recall of new information(Suspected) Awareness: Impaired Awareness Impairment: Intellectual impairment;Emergent impairment;Anticipatory impairment Problem Solving: Appears intact Executive Function: Reasoning Reasoning: Impaired Reasoning Impairment: Verbal basic Safety/Judgment: Impaired Comments: See impression statement.       Comprehension  Auditory Comprehension Overall Auditory Comprehension: Appears within functional limits for tasks assessed Yes/No Questions: Within Functional Limits Visual  Recognition/Discrimination Discrimination: Not tested Reading Comprehension Reading Status: Not tested    Expression Expression Primary Mode of Expression: Nonverbal - written(Communication board) Verbal Expression Overall Verbal Expression: Impaired at baseline Non-Verbal Means of Communication: Writing;Communication board Other Verbal Expression Comments: Pt nonverbal at baseline Written Expression Dominant Hand: Left Written Expression: Exceptions to [facility] Interfering Components: Legibility;Other (comment)(Choreic movements)   Oral / Motor  Oral Motor/Sensory Function Overall Oral Motor/Sensory Function: Severe impairment Facial ROM: Reduced right;Suspected CN VII (facial) dysfunction Facial Symmetry: Abnormal symmetry right;Suspected CN VII (facial) dysfunction Facial Strength: Reduced right;Suspected CN VII (facial) dysfunction Lingual ROM: Suspected CN XII (hypoglossal) dysfunction;Reduced right;Reduced left Lingual Symmetry: Abnormal symmetry right Lingual Strength: Within Functional Limits Velum: Other (comment)(Did not produce voice) Mandible: Within Functional Limits Motor Speech Overall Motor Speech: Other (comment) Respiration: (UTA)   GO                    Aaron Edelman, Student SLP 01/01/2017, 11:06 AM

## 2017-01-02 ENCOUNTER — Inpatient Hospital Stay (HOSPITAL_COMMUNITY): Payer: Medicare HMO

## 2017-01-02 DIAGNOSIS — J9690 Respiratory failure, unspecified, unspecified whether with hypoxia or hypercapnia: Secondary | ICD-10-CM

## 2017-01-02 DIAGNOSIS — R258 Other abnormal involuntary movements: Secondary | ICD-10-CM

## 2017-01-02 DIAGNOSIS — I10 Essential (primary) hypertension: Secondary | ICD-10-CM

## 2017-01-02 DIAGNOSIS — E1159 Type 2 diabetes mellitus with other circulatory complications: Secondary | ICD-10-CM

## 2017-01-02 DIAGNOSIS — I4901 Ventricular fibrillation: Secondary | ICD-10-CM

## 2017-01-02 DIAGNOSIS — I259 Chronic ischemic heart disease, unspecified: Secondary | ICD-10-CM

## 2017-01-02 DIAGNOSIS — I639 Cerebral infarction, unspecified: Secondary | ICD-10-CM

## 2017-01-02 DIAGNOSIS — Z794 Long term (current) use of insulin: Secondary | ICD-10-CM

## 2017-01-02 DIAGNOSIS — I469 Cardiac arrest, cause unspecified: Secondary | ICD-10-CM

## 2017-01-02 LAB — GLUCOSE, CAPILLARY
GLUCOSE-CAPILLARY: 141 mg/dL — AB (ref 65–99)
GLUCOSE-CAPILLARY: 157 mg/dL — AB (ref 65–99)
GLUCOSE-CAPILLARY: 179 mg/dL — AB (ref 65–99)
GLUCOSE-CAPILLARY: 215 mg/dL — AB (ref 65–99)
GLUCOSE-CAPILLARY: 235 mg/dL — AB (ref 65–99)
Glucose-Capillary: 164 mg/dL — ABNORMAL HIGH (ref 65–99)
Glucose-Capillary: 173 mg/dL — ABNORMAL HIGH (ref 65–99)

## 2017-01-02 LAB — RENAL FUNCTION PANEL
ALBUMIN: 3.1 g/dL — AB (ref 3.5–5.0)
ANION GAP: 10 (ref 5–15)
BUN: 11 mg/dL (ref 6–20)
CO2: 23 mmol/L (ref 22–32)
Calcium: 9.2 mg/dL (ref 8.9–10.3)
Chloride: 107 mmol/L (ref 101–111)
Creatinine, Ser: 0.93 mg/dL (ref 0.44–1.00)
GFR, EST NON AFRICAN AMERICAN: 59 mL/min — AB (ref >60)
Glucose, Bld: 187 mg/dL — ABNORMAL HIGH (ref 65–99)
PHOSPHORUS: 1.9 mg/dL — AB (ref 2.5–4.6)
POTASSIUM: 4.3 mmol/L (ref 3.5–5.1)
Sodium: 140 mmol/L (ref 135–145)

## 2017-01-02 LAB — CBC WITH DIFFERENTIAL/PLATELET
BASOS ABS: 0 10*3/uL (ref 0.0–0.1)
Basophils Relative: 1 %
Eosinophils Absolute: 0.1 10*3/uL (ref 0.0–0.7)
Eosinophils Relative: 2 %
HEMATOCRIT: 32.6 % — AB (ref 36.0–46.0)
Hemoglobin: 10 g/dL — ABNORMAL LOW (ref 12.0–15.0)
LYMPHS PCT: 19 %
Lymphs Abs: 1.3 10*3/uL (ref 0.7–4.0)
MCH: 26.5 pg (ref 26.0–34.0)
MCHC: 30.7 g/dL (ref 30.0–36.0)
MCV: 86.2 fL (ref 78.0–100.0)
Monocytes Absolute: 0.7 10*3/uL (ref 0.1–1.0)
Monocytes Relative: 10 %
NEUTROS ABS: 4.7 10*3/uL (ref 1.7–7.7)
Neutrophils Relative %: 68 %
Platelets: 126 10*3/uL — ABNORMAL LOW (ref 150–400)
RBC: 3.78 MIL/uL — AB (ref 3.87–5.11)
RDW: 15.3 % (ref 11.5–15.5)
WBC: 6.8 10*3/uL (ref 4.0–10.5)

## 2017-01-02 LAB — MAGNESIUM: Magnesium: 2.1 mg/dL (ref 1.7–2.4)

## 2017-01-02 MED ORDER — LACTATED RINGERS IV SOLN
INTRAVENOUS | Status: DC
  Administered 2017-01-02 – 2017-01-03 (×2): via INTRAVENOUS

## 2017-01-02 MED ORDER — RESOURCE THICKENUP CLEAR PO POWD
ORAL | Status: DC | PRN
  Administered 2017-01-05: 21:00:00 via ORAL
  Filled 2017-01-02 (×2): qty 125

## 2017-01-02 MED ORDER — SODIUM PHOSPHATES 45 MMOLE/15ML IV SOLN
30.0000 mmol | Freq: Once | INTRAVENOUS | Status: AC
  Administered 2017-01-02: 30 mmol via INTRAVENOUS
  Filled 2017-01-02: qty 10

## 2017-01-02 NOTE — Progress Notes (Signed)
PROGRESS NOTE    [name] [name]  DGU:[id number] DOB: 1940/03/25 DOA: 12/29/2016 PCP: [name], MD    Brief Narrative:  76 year old female who presented after a cardiac arrest, ventricular fibrillation. Patient is known to have history of CVA with aphasia and wheelchair-bound, coronary artery disease status post bypass grafting, type 2 diabetes mellitus, GERD as hypertension, dyslipidemia and peripheral vascular disease. She developed an acute syncope episode while being on a bus, she was found in ventricular fibrillation cardiac arrest, she received electrical cardioversion x1 per EMS, with conversion to sinus rhythm and recovering spontaneous circulation and was brought into the hospital. She was apparently intubated on the field for airway protection. On initial physical examination, blood pressure 230/128, heart rate 53, temperature 97.1, respiratory 27, oxygen saturation 100% on 50% FiO2, invasive mechanical ventilation/ PRVC. She was sedated, heart S1-S2 present and rhythmic, lungs were clear to auscultation bilaterally, diminished breath sounds, abdomen was soft and nontender, no lower extremity edema. Sodium 139, potassium 3.2, chloride 105, bicarbonate 23, glucose 241, BUN 20, creatinine 1.41, magnesium 1.7, arterial blood gas, 7.29/ 56.3/ 234/ 27.4/ 100%. White cell count 16.0, hematocrit 47.0. Head CT with no acute findings, advanced chronic small vessel disease with mild progression in the deep gray matter. Her chest x-ray was clear for infiltrates, endotracheal tube about the carina. EKG sinus rhythm, 143 bpm, ST elevations in the inferior lateral leads, poor progression, normal axis and normal intervals.  Patient was admitted to the intensive care unit with the working diagnosis of ventricular fibrillation cardiac arrest, complicated by CNS related acute hypoxic/ hypercapnic respiratory failure.   Assessment & Plan:   Active Problems:   Cardiac arrest [facility])   Cardiac arrest with  ventricular fibrillation (HCC)   Respiratory failure ([place])   1. Status post cardiac arrest with NSTEMI. Patient has been hemodynamically stable for the last 24 hours, on admission required vasopressors. Echocardiogram with significant drop in LV systolic function from 95-63% to 25- 30%, with new akinesis of the anterolateral myocardium, severe hypokinesis basal (old) and mid anterior myocardium. Patient not able to tolerate po, will continue rectal aspirin. Keep mg at 2 and K at 4. Resume statin and clopidogrel when safe po intake.   2. CNS related acute hypoxic/hypercapnic respiratory failure. Patient liberated from mechanical ventilation on 11/7, successfully transitioned to [place], will continue oxymetry monitoring and supplemental 02 per [place].   3. History of CVA. Patient aphasic at baseline, unclear if right hemiparesis is new, will need MRI or follow CVA, poor prognosis, will continue neuro checks and aspiration precautions. Follow speech therapy recommendations. If continue to failed swallow evaluations, will need ng tube and possibly peg tube.   4. Hypertension. Will continue metoprolol IV with good toleration.   5. Type 2 diabetes mellitus. Will change IV dextrose to lactated ringers, will continue insulin sliding scale for glucose cover and monitoring.    DVT prophylaxis: heparin  Code Status: full Family Communication: no family at the bedside Disposition Plan: home   Consultants:   Neurology   Procedures:     Antimicrobials:       Subjective: Patient not verbal, not moving the right side, has been npo due to risk of aspiration, no apparent pain or dyspnea. Aphasia not new.   Objective: Vitals:   01/02/17 0400 01/02/17 0403 01/02/17 0438 01/02/17 0500  BP: (!) 84/28 92/72  102/75  Pulse:  78  76  Resp: (!) 41 (!) 26  (!) 26  Temp:      TempSrc:  SpO2:  94%  98%  Weight:   72.3 kg (159 lb 6.3 oz)   Height:        Intake/Output Summary (Last 24 hours) at  01/02/2017 0802 Last data filed at 01/02/2017 0600 Gross per 24 hour  Intake 1057.83 ml  Output 100 ml  Net 957.83 ml   Filed Weights   12/31/16 0456 01/01/17 0500 01/02/17 0438  Weight: 72.3 kg (159 lb 6.3 oz) 72.3 kg (159 lb 6.3 oz) 72.3 kg (159 lb 6.3 oz)    Examination:   General: deconditioned Neurology: Awake and alert, right side hemiparesis E ENT: no pallor, no icterus, oral mucosa moist Cardiovascular: No JVD. S1-S2 present, rhythmic, no gallops, rubs, or murmurs. No lower extremity edema. Pulmonary: decreases breath sounds bilaterally at bases, adequate air movement, no wheezing, rhonchi or rales. Gastrointestinal. Abdomen flat, no organomegaly, non tender, no rebound or guarding Skin. No rashes Musculoskeletal: no joint deformities     Data Reviewed: I have personally reviewed following labs and imaging studies  CBC: Recent Labs  Lab 12/29/16 1200 12/30/16 0425 12/31/16 0231 01/01/17 0459 01/02/17 0442  WBC  --  12.5* 9.7 6.3 6.8  NEUTROABS  --   --   --   --  4.7  HGB 16.0* 12.3 11.0* 9.4* 10.0*  HCT 47.0* 39.3 34.7* 31.4* 32.6*  MCV  --  85.1 85.0 87.2 86.2  PLT  --  174 123* 102* 324*   Basic Metabolic Panel: Recent Labs  Lab 12/29/16 1811  12/30/16 1418 12/30/16 1447 12/31/16 0231 01/01/17 0459 01/01/17 0929 01/01/17 1742 01/02/17 0442  NA 139   < > 137  --  136 138  --  139 140  K 3.2*   < > 4.1  --  3.6 3.4*  --  4.1 4.3  CL 105   < > 105  --  106 105  --  108 107  CO2 23   < > 26  --  24 26  --  26 23  GLUCOSE 241*   < > 192*  --  190* 201*  --  167* 187*  BUN 20   < > 17  --  12 9  --  9 11  CREATININE 1.41*   < > 1.07*  --  0.92 0.91  --  0.84 0.93  CALCIUM 8.4*   < > 8.7*  --  8.8* 8.9  --  9.0 9.2  MG 1.7  --   --  1.7 1.9  --  1.5*  --  2.1  PHOS 4.5  --   --   --  2.5  --   --   --  1.9*   < > = values in this interval not displayed.   GFR: Estimated Creatinine Clearance: 48.7 mL/min (by C-G formula based on SCr of 0.93  mg/dL). Liver Function Tests: Recent Labs  Lab 12/29/16 1811 12/31/16 0231 01/02/17 0442  AST 265* 46*  --   ALT 158* 78*  --   ALKPHOS 91 57  --   BILITOT 0.7 0.9  --   PROT 6.7 5.9*  --   ALBUMIN 3.3* 2.9* 3.1*   No results for input(s): LIPASE, AMYLASE in the last 168 hours. No results for input(s): AMMONIA in the last 168 hours. Coagulation Profile: No results for input(s): INR, PROTIME in the last 168 hours. Cardiac Enzymes: Recent Labs  Lab 12/29/16 1245 12/29/16 1811 12/30/16 0058  TROPONINI 0.05* <0.03 1.11*   BNP (last 3 results)  No results for input(s): PROBNP in the last 8760 hours. HbA1C: No results for input(s): HGBA1C in the last 72 hours. CBG: Recent Labs  Lab 01/01/17 1115 01/01/17 1552 01/01/17 2049 01/02/17 0022 01/02/17 0353  GLUCAP 167* 159* 162* 157* 141*   Lipid Profile: No results for input(s): CHOL, HDL, LDLCALC, TRIG, CHOLHDL, LDLDIRECT in the last 72 hours. Thyroid Function Tests: No results for input(s): TSH, T4TOTAL, FREET4, T3FREE, THYROIDAB in the last 72 hours. Anemia Panel: No results for input(s): VITAMINB12, FOLATE, FERRITIN, TIBC, IRON, RETICCTPCT in the last 72 hours.    Radiology Studies: I have reviewed all of the imaging during this hospital visit personally     Scheduled Meds: . aspirin  300 mg Rectal Daily  . atorvastatin  40 mg Oral q1800  . Chlorhexidine Gluconate Cloth  6 each Topical Daily  . clopidogrel  75 mg Per Tube Daily  . heparin subcutaneous  5,000 Units Subcutaneous Q8H  . insulin aspart  0-9 Units Subcutaneous Q4H  . mouth rinse  15 mL Mouth Rinse BID  . metoprolol tartrate  2.5 mg Intravenous Q6H  . sodium chloride flush  10-40 mL Intracatheter Q12H   Continuous Infusions: . dextrose 5 % and 0.45% NaCl 25 mL/hr at 01/01/17 1800  . sodium phosphate  Dextrose 5% IVPB 30 mmol (01/02/17 0620)     LOS: 4 days        Tonisha Silvey Gerome Apley, MD [facility] Pager [phone]

## 2017-01-02 NOTE — Progress Notes (Addendum)
I have reviewed her chart and appreciate the details of Dr. Lindell Noe in the care of the patient.  I agree with her assessment. I am fine discontinuing Plavix, clearly patient has deteriorated from her baseline.  I had extensive  30 min. conversation with [name], her daughter, regarding extremely poor and guarded prognosis and to make her DO NOT RESUSCITATE starting tonight.  She will discuss with her sister [name], and make final decision and advised her that probably in view of severe LV systolic dysfunction, multiple medical comorbidities,  patient probably needs to be made sooner than later. They understand the situation, I will also try to contact them again this evening.  From cardiac standpoint, not much to offer.  Comfort measures, palliative care and I'll continue to follow sidelines.  Please do not hesitate to contact me at any time to assist in making decisions with regard to her healthcare and DO NOT RESUSCITATE status. I have cared for the patient for many years and I know the family very well.  Scheduled Meds: . aspirin  300 mg Rectal Daily  . atorvastatin  40 mg Oral q1800  . Chlorhexidine Gluconate Cloth  6 each Topical Daily  . clopidogrel  75 mg Per Tube Daily  . heparin subcutaneous  5,000 Units Subcutaneous Q8H  . insulin aspart  0-9 Units Subcutaneous Q4H  . mouth rinse  15 mL Mouth Rinse BID  . metoprolol tartrate  2.5 mg Intravenous Q6H  . sodium chloride flush  10-40 mL Intracatheter Q12H   Continuous Infusions: . lactated ringers Stopped (01/02/17 1521)   PRN Meds:.RESOURCE THICKENUP CLEAR, sodium chloride flush   Okay to discontinue Plavix if oral not tolerated. Start ACEi when oral tolerated and convert Metoprolol to PO 25 mg BID as well.    [name] Prows, MD 01/02/2017, 6:04 PM [facility]. [place] Pager: [phone] Office: [phone] If no answer: Cell:  [phone]

## 2017-01-02 NOTE — Progress Notes (Signed)
   01/02/17 1000  Clinical Encounter Type  Visited With Patient  Visit Type Initial  Referral From Care management  Consult/Referral To Chaplain  Spiritual Encounters  Spiritual Needs Prayer;Emotional  Stress Factors  Patient Stress Factors None identified  Family Stress Factors Health changes   Met patient for 1st time. She was trying to put notebook paper in a notebook.  I offered to have prayer if she would like that. Had prayer and then she wrote a note asking for water since she could not talk.  I shared with her nurse.  Patient seemed restless and nurse was coming to take care of her.  Did not seem to be a good time to help patient with palliative care talk.   Conard Novak, Chaplain

## 2017-01-02 NOTE — Progress Notes (Signed)
Cordial visit just to check on patient.  Patient is resting comfortably in bed showing no choreiform movements.  As stated previously left-sided frequent arm and leg movements could be secondary to a new stroke given recent event.  Given chorea movements possibly due to basal ganglia injury or from hypoxia.  Still recommend brain MRI if tolerated.  Also may treat with clonazepam 0.5 mg twice daily if movements become bothersome.  Neurology will continue to followDavid [facility] [phone]  M-F  (8:30 am- 4 PM)  01/02/2017, 9:34 AM

## 2017-01-02 NOTE — Clinical Social Work Note (Signed)
Clinical Social Work Assessment  Patient Details  Name: [name] [name] MRN: [id number] Date of Birth: Jan 09, 1941  Date of referral:  01/02/17               Reason for consult:  Facility Placement                Permission sought to share information with:  Family Supports Permission granted to share information::  Yes, Verbal Permission Granted  Name::     [name] [name] Boggs(pt's daughter)  Agency::     Relationship::     Contact Information:     Housing/Transportation Living arrangements for the past 2 months:  Apartment(with grandsons. ) Source of Information:  Adult Children Patient Interpreter Needed:  None Criminal Activity/Legal Involvement Pertinent to Current Situation/Hospitalization:  No - Comment as needed Significant Relationships:  Adult Children, Warehouse manager, Other Family Members Lives with:  Other (Comment)(grandsons) Do you feel safe going back to the place where you live?  Yes Need for family participation in patient care:  Yes (Comment)  Care giving concerns:  CSW went to speak with pt at bedside. During this time CSW was unable to gather much information from pt as RN informed CSW that pt is mute but alert. CSW reached out to pt's daughter [name] [name] for further needed information.    Social Worker assessment / plan:  CSW spoke with pt's daughter [name] [name] via phone. During this time CSW was informed that pt is from home where pt lived with her grandsons. [name] [name] reports that pt's grandsons are primary supports for pt as well as herself. [name] [name] is agreeable to SNF for pt but isn't sure if pt will e. [name] [name] reports that pt has been to [facility] in the past and received great care from the staff there.   Employment status:  Retired Nurse, adult PT Recommendations:  [place], [place] / Referral to community resources:  [place]  Patient/Family's Response to care:  [name] is  understanding and agreeable to plan of care at this time.   Patient/Family's Understanding of and Emotional Response to Diagnosis, Current Treatment, and Prognosis:  No further questions or concerns have been presented to CSW at this time.   Emotional Assessment Appearance:  Appears stated age Attitude/Demeanor/Rapport:    Affect (typically observed):  Pleasant Orientation:  Oriented to Self Alcohol / Substance use:  Not Applicable Psych involvement (Current and /or in the community):  No (Comment)  Discharge Needs  Concerns to be addressed:  No discharge needs identified Readmission within the last 30 days:  No Current discharge risk:  None Barriers to Discharge:  No Barriers Identified   Wetzel Bjornstad, [place] 01/02/2017, 8:18 AM

## 2017-01-02 NOTE — Progress Notes (Signed)
Patient not able to speak.  Had notebook and pen. I asked if she would like prayer and had prayer with her. She wrote note wanting water. I shared with her nurse.  Not able to have much communication. Conard Novak, Chaplain

## 2017-01-02 NOTE — NC FL2 (Signed)
[place] MEDICAID FL2 LEVEL OF CARE SCREENING TOOL     IDENTIFICATION  Patient Name: [name] [name] Birthdate: [birth year] Sex: female Admission Date (Current Location): 12/29/2016  [facility] and [place] Number:  Herbalist and Address:  [facility] [place] [address] [place], [place] [address]      Provider Number: [id number]  Attending Physician Name and Address:  Allie Bossier, MD  Relative Name and Phone Number:       Current Level of Care: SNF Recommended Level of Care: [place] Prior Approval Number:    Date Approved/Denied:   PASRR Number:   [id number]   Discharge Plan: SNF    Current Diagnoses: Patient Active Problem List   Diagnosis Date Noted  . Cardiac arrest with ventricular fibrillation ([place])   . Respiratory failure ([place])   . Cardiac arrest ([place]) 12/29/2016  . Malnutrition of moderate degree 07/15/2016  . Diastolic CHF ([place]) 07/37/1062  . Ankle ulcer, right, limited to breakdown of skin ([place]) 07/12/2016  . Malignant neoplasm of lower-outer quadrant of right breast of female, estrogen receptor positive ([place]) 05/28/2016  . Chronic combined systolic and diastolic CHF (congestive heart failure) ([place]) 12/01/2015  . Acute encephalopathy 12/01/2015  . Hypoglycemia 12/01/2015  . Elevated troponin 12/01/2015  . Diabetes mellitus without complication ([place])   . Hypothermia   . Rhonchi   . Acute respiratory failure ([place]) 01/28/2014  . Respiratory distress 01/27/2014  . Hypoxia 01/27/2014  . CHF exacerbation ([place]) 01/27/2014  . Acute on chronic systolic CHF (congestive heart failure) ([place]) 01/27/2014  . Systolic CHF, acute on chronic ([place]) 01/27/2014  . Increased anion gap metabolic acidosis 69/48/5462  . Right foot ulcer ([place]) 01/27/2014  . Aspiration pneumonia ([place]) 06/23/2012  . Dysphagia 06/21/2012  . Palliative care encounter 06/20/2012  . Healthcare-associated pneumonia 06/16/2012  . TIA (transient  ischemic attack) 05/05/2012  . N&V (nausea and vomiting) 05/04/2012  . Weakness generalized 05/04/2012  . 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 NSTEMI (non-ST elevated myocardial infarction) ([place]) 07/27/2011  . Diabetes mellitus ([place])   . Chest pain 03/10/2011  . HTN (hypertension) 03/10/2011  . Cerebral infarction ([place]) 03/10/2011  . CAD (coronary artery disease), native coronary artery 03/10/2011  . 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 S/P PTCA (percutaneous transluminal coronary angioplasty) 03/10/2011    Orientation RESPIRATION BLADDER Height & Weight     Self  O2(4 L) Incontinent Weight: 159 lb 6.3 oz (72.3 kg) Height:  5\' 2"  (157.5 cm)  BEHAVIORAL SYMPTOMS/MOOD NEUROLOGICAL BOWEL NUTRITION STATUS      Incontinent Diet(please see discharge summary)  AMBULATORY STATUS COMMUNICATION OF NEEDS Skin   Limited Assist Verbally Skin abrasions(ulcer on leg)                       Personal Care Assistance Level of Assistance  Bathing, Feeding, Dressing Bathing Assistance: Maximum assistance Feeding assistance: Limited assistance Dressing Assistance: Maximum assistance     Functional Limitations Info  Sight, Hearing, Speech Sight Info: Adequate Hearing Info: Adequate Speech Info: Impaired(per nurse pt is mute at this time. )    [place]  PT (By licensed PT), OT (By licensed OT), Speech therapy     PT Frequency: 5 times a week  OT Frequency: 5 times a week      Speech Therapy Frequency: 5 times a week       Contractures Contractures Info:  Not present    Additional Factors Info  Code Status, Allergies Code Status Info: Full Allergies Info:  Penicillins           Current Medications (01/02/2017):  This is the current hospital active medication list Current Facility-Administered Medications  Medication Dose Route Frequency Provider Last Rate Last Dose  . aspirin suppository 300 mg   300 mg Rectal Daily Sela Hilding, MD   300 mg at 01/01/17 1219  . atorvastatin (LIPITOR) tablet 40 mg  40 mg Oral q1800 Javier Glazier, MD   Stopped at 12/31/16 1704  . Chlorhexidine Gluconate Cloth 2 % PADS 6 each  6 each Topical Daily Javier Glazier, MD   6 each at 01/01/17 1630  . clopidogrel (PLAVIX) tablet 75 mg  75 mg Per Tube Daily Sampson Goon, MD   Stopped at 01/01/17 1644  . heparin injection 5,000 Units  5,000 Units Subcutaneous Q8H Javier Glazier, MD   5,000 Units at 01/02/17 0500  . insulin aspart (novoLOG) injection 0-9 Units  0-9 Units Subcutaneous Q4H Anders Simmonds, MD   1 Units at 01/02/17 [id number]  . lactated ringers infusion   Intravenous Continuous Arrien, Jimmy Picket, MD      . MEDLINE mouth rinse  15 mL Mouth Rinse BID Javier Glazier, MD   15 mL at 01/01/17 2118  . metoprolol tartrate (LOPRESSOR) injection 2.5 mg  2.5 mg Intravenous Q6H Sampson Goon, MD   2.5 mg at 01/02/17 0500  . sodium chloride flush (NS) 0.9 % injection 10-40 mL  10-40 mL Intracatheter Q12H Javier Glazier, MD   10 mL at 01/01/17 2151  . sodium chloride flush (NS) 0.9 % injection 10-40 mL  10-40 mL Intracatheter PRN Javier Glazier, MD      . sodium phosphate 30 mmol in dextrose 5 % 250 mL infusion  30 mmol Intravenous Once Colbert Coyer, MD 43 mL/hr at 01/02/17 0620 30 mmol at 01/02/17 0620     Discharge Medications: Please see discharge summary for a list of discharge medications.  Relevant Imaging Results:  Relevant Lab Results:   Additional Information SSN- [id number]  Wetzel Bjornstad, LCSWA

## 2017-01-02 NOTE — Progress Notes (Signed)
  Speech Language Pathology Treatment:    Patient Details Name: [name] [name] MRN: [id number] DOB: 1940/09/07 Today's Date: 01/02/2017 Time: 2671-2458 SLP Time Calculation (min) (ACUTE ONLY): 45 min  Assessment / Plan / Recommendation Clinical Impression  Pt seen for PO trials this am, able to control her hand and arm today, though with volitional movement she is still rocking and lifting her shoulder. Writing is still mostly illegible. SLP discussed plan to attempt more POs today with pt nodding understanding. Provided assist for pt to take independent cup sips of water, again eliciting severe anterior spillage and coughing. Pt fed herself 8 oz of applesauce with lingual thrusting and oral packing to aid in bolus transit. SLP encouraged volitional coughing intermittently. Swallow trigger often significantly delayed but present and no distress noted after intake. Will plan on MBS in pm to determine best diet, daughter will attend.     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 Patient also has recent diagnosis of breast cancer. Patient become unresponsive the date of admission while traveling with her grandson. Upon EMS arrival patient was in ventricular fibrillation. She underwent cardioversion with one shock; pt had cardiac arrest x2. Given the patient's multiple medical comorbidities the decision was made to continue with medical care. Pt intubated 11/5-11/7. Family reports coughing with eating at home; previous SLP notes show severe oral dysphagia at baseline with mild pharyngeal phase deficits.        SLP Plan  MBS       Recommendations  Diet recommendations: NPO                Plan: MBS       GO               Herbie [place], MA CCC-SLP [phone]  Lynann Beaver 01/02/2017, 1:57 PM

## 2017-01-02 NOTE — Progress Notes (Signed)
NURSING PROGRESS NOTE  [name] [name] [id number] Transfer Data: 01/02/2017 6:58 PM Attending Provider: Tawni Millers EQA:[id number], Jori Moll, MD Code Status: Full   [name] [name] is a 76 y.o. female patient transferred from 12M  -No acute distress noted.  -No complaints of shortness of breath.  -No complaints of chest pain.   Cardiac Monitoring: Box # wall 19 in place. Cardiac monitor yields:SR.  Blood pressure (!) 137/52, pulse 77, temperature 98.7 F (37.1 C), temperature source Oral, resp. rate 19, height 5\' 2"  (1.575 m), weight 72.3 kg (159 lb 6.3 oz), SpO2 98 %.   IV Fluids:  IV in place, occlusive dsg intact without redness, IV cath LR 50 cc/hr  Allergies:  Penicillins  Past Medical History:   has a past medical history of Angina, CHF (congestive heart failure) ([place]), Complication of anesthesia, Diabetes mellitus ([place]), GERD (gastroesophageal reflux disease), H/O hiatal hernia, Hypercholesteremia, Hypertension, Myocardial infarction [facility]), Peripheral vascular disease ([place]), and Stroke ([place]) (2002 and 2003).  Past Surgical History:   has a past surgical history that includes Coronary artery bypass graft; Abdominal hysterectomy; Breast biopsy; Cardiac catheterization (02/2011); Breast cyst excision (Left); ESOPHAGOGASTRODUODENOSCOPY (EGD) (N/A, 03/12/2011); and LEFT HEART CATHETERIZATION WITH CORONARY ANGIOGRAM (N/A, 03/10/2011).  Social History:   reports that  has never smoked. she has never used smokeless tobacco. She reports that she does not drink alcohol or use drugs.  Skin: venous ulcer Right calf  Patient/Family orientated to room. Information packet given to patient/family. Admission inpatient armband information verified with patient/family to include name and date of birth and placed on patient arm. Side rails up x 2, fall assessment and education completed with patient/family. Patient/family able to verbalize understanding of risk associated with falls and verbalized  understanding to call for assistance before getting out of bed. Call light within reach. Patient/family able to voice and demonstrate understanding of unit orientation instructions.    Will continue to evaluate and treat per MD orders.

## 2017-01-02 NOTE — Progress Notes (Signed)
eLink Physician-Brief Progress Note Patient Name: [name] [name] DOB: Feb 12, 1941 MRN: [id number]   Date of Service  01/02/2017  HPI/Events of Note  Hypophos  eICU Interventions  Phos replaced     Intervention Category Intermediate Interventions: Electrolyte abnormality - evaluation and management  DETERDING,ELIZABETH 01/02/2017, 5:53 AM

## 2017-01-02 NOTE — Progress Notes (Signed)
Modified Barium Swallow Progress Note  Patient Details  Name: [name] [name] MRN: [id number] Date of Birth: [birth year]  Today's Date: 01/02/2017  Modified Barium Swallow completed.  Full report located under Chart Review in the Imaging Section.  Brief recommendations include the following:  Clinical Impression  Pt demonstrates a severe oral dysphagia, possibly further impaired from pts baseline, though daughters present also report over the last month [name] [name] has been slower with PO and more easily fatigued. There is severe lingual and labial weakenss with no ability to form or hold a blus, relying on lingual thrusting and gravity to propel portions of bolus to the pharynx. More than 50% spills anteriorally, the rest spills to the pyriforms with prolonged pooling prior to swallow initiation. Pt will often try to take further sips before initiating swallow, though verbal cues elicited swallow trigger when needed. Oropharyngeal musculature relatively strong, but when pt swallows with thin liquids in the pyriforms, pressure pushes moderate amount of thin liquids through the posterior portion of the vocal folds, indicating decreased closure at that point. Nectar thick liquids do not enter the airway. Overall, suspect pt has had a gradual decline in oral function, exacerbated by acute illness. Airway protection is now also acutley compromised following 3 day intubation, likely there is some level of inflammation or injury to the arytenoids/vocal folds post intubation causing decreased airway closure and infiltration of liquids at that site. Expect improvement in the short term. Pt may initiate a puree and nectar thick diet with decreased, but not absent risk of aspiration. Discussed potential risk with daughters who verbalize awanress of risk, present even before this admission, and agree to diet initiation. Also discussed plan with RN. PO intake will be laborious and messy. SLP will follow for tolerance.     Swallow Evaluation Recommendations       SLP Diet Recommendations: Dysphagia 1 (Puree) solids;Nectar thick liquid   Liquid Administration via: Cup   Medication Administration: Crushed with puree   Supervision: Staff to assist with self feeding(as needed)   Compensations: Slow rate;Small sips/bites   Postural Changes: Remain semi-upright after after feeds/meals (Comment);Seated upright at 90 degrees   Oral Care Recommendations: Oral care before and after PO   Other Recommendations: Have oral suction available;Order thickener from [place], [place] CCC-SLP [phone]  [name] [name] 01/02/2017,2:20 PM

## 2017-01-03 DIAGNOSIS — L89302 Pressure ulcer of unspecified buttock, stage 2: Secondary | ICD-10-CM

## 2017-01-03 LAB — BASIC METABOLIC PANEL
ANION GAP: 6 (ref 5–15)
BUN: 11 mg/dL (ref 6–20)
CO2: 26 mmol/L (ref 22–32)
Calcium: 9 mg/dL (ref 8.9–10.3)
Chloride: 106 mmol/L (ref 101–111)
Creatinine, Ser: 0.91 mg/dL (ref 0.44–1.00)
GFR calc Af Amer: >60 mL/min (ref >60)
Glucose, Bld: 258 mg/dL — ABNORMAL HIGH (ref 65–99)
POTASSIUM: 3.6 mmol/L (ref 3.5–5.1)
SODIUM: 138 mmol/L (ref 135–145)

## 2017-01-03 LAB — MAGNESIUM: MAGNESIUM: 1.5 mg/dL — AB (ref 1.7–2.4)

## 2017-01-03 LAB — GLUCOSE, RANDOM: GLUCOSE: 336 mg/dL — AB (ref 65–99)

## 2017-01-03 LAB — GLUCOSE, CAPILLARY
GLUCOSE-CAPILLARY: 151 mg/dL — AB (ref 65–99)
GLUCOSE-CAPILLARY: 157 mg/dL — AB (ref 65–99)
GLUCOSE-CAPILLARY: 162 mg/dL — AB (ref 65–99)
GLUCOSE-CAPILLARY: 247 mg/dL — AB (ref 65–99)
GLUCOSE-CAPILLARY: 359 mg/dL — AB (ref 65–99)
GLUCOSE-CAPILLARY: 506 mg/dL — AB (ref 65–99)
GLUCOSE-CAPILLARY: 52 mg/dL — AB (ref 65–99)

## 2017-01-03 MED ORDER — CARVEDILOL 3.125 MG PO TABS
3.1250 mg | ORAL_TABLET | Freq: Two times a day (BID) | ORAL | Status: DC
  Administered 2017-01-03 – 2017-01-06 (×6): 3.125 mg via ORAL
  Filled 2017-01-03 (×6): qty 1

## 2017-01-03 MED ORDER — INSULIN ASPART 100 UNIT/ML ~~LOC~~ SOLN
15.0000 [IU] | Freq: Once | SUBCUTANEOUS | Status: AC
  Administered 2017-01-03: 15 [IU] via SUBCUTANEOUS

## 2017-01-03 MED ORDER — LISINOPRIL 2.5 MG PO TABS
2.5000 mg | ORAL_TABLET | Freq: Every day | ORAL | Status: DC
  Administered 2017-01-03 – 2017-01-04 (×2): 2.5 mg via ORAL
  Filled 2017-01-03 (×2): qty 1

## 2017-01-03 MED ORDER — ASPIRIN 81 MG PO CHEW
81.0000 mg | CHEWABLE_TABLET | Freq: Every day | ORAL | Status: DC
  Administered 2017-01-04 – 2017-01-06 (×3): 81 mg via ORAL
  Filled 2017-01-03 (×3): qty 1

## 2017-01-03 NOTE — Progress Notes (Signed)
  Speech Language Pathology Treatment: Dysphagia;Cognitive-Linquistic  Patient Details Name: [name] [name] MRN: [id number] DOB: 1940/05/26 Today's Date: 01/03/2017 Time: 4098-1191 SLP Time Calculation (min) (ACUTE ONLY): 40 min  Assessment / Plan / Recommendation Clinical Impression  Patient seen for dysphagia and cognitive linguistic treatment. For dysphagia treatment, SLP assisted pt with sitting upright at edge of bed for intake per her request. Provided diagnostic trials of nectar thick liquids; pt declined solids, writing in her notebook that she was full. Pt opted to use spoon for bolus retrieval. Significant anterior loss continues. She exhibited cough x1, suspect reduced airway protection due to presentation of subsequent bolus prior to initiating swallow of previous bolus. SLP provided moderate verbal cues to slow rate, occasional cues to initiate swallow with no further signs of aspiration. Cognitive linguistic treatment targeted use of alternative aids for communication. Pt utilized pen and paper to express needs and thoughts with rare cues from SLP to clarify illegible or omitted words. Pt became tearful, sobbing as she wrote in her notebook "I am tired of being sick," and "I want to die." SLP offered support and requested if pt would like to speak with a counselor or chaplain for emotional support. Pt agreed. Informed RN. Will continue to follow for swallowing and cognitive-linguistic needs.    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 Patient also has recent diagnosis of breast cancer. Patient become unresponsive the date of admission while traveling with her grandson. Upon EMS arrival patient was in ventricular fibrillation. She underwent cardioversion with one shock; pt had cardiac arrest x2. Given the patient's multiple medical comorbidities the decision was made to  continue with medical care. Pt intubated 11/5-11/7. Family reports coughing with eating at home; previous SLP notes show severe oral dysphagia at baseline with mild pharyngeal phase deficits.        SLP Plan  Continue with current plan of care       Recommendations  Diet recommendations: Dysphagia 1 (puree);Nectar-thick liquid Liquids provided via: Teaspoon;Cup Medication Administration: Crushed with puree Supervision: Patient able to self feed;Staff to assist with self feeding Compensations: Slow rate;Small sips/bites                General recommendations: Other(comment)(neuropsych consult) Oral Care Recommendations: Oral care BID Follow up Recommendations: Home health SLP SLP Visit Diagnosis: Dysphagia, oropharyngeal phase (R13.12);Dysphagia, oral phase (R13.11) Plan: Continue with current plan of care       [place], [place], [place] Pathologist [phone]  Aliene Altes 01/03/2017, 3:40 PM

## 2017-01-03 NOTE — Progress Notes (Signed)
PROGRESS NOTE    [name] [name]  VQQ:[id number] DOB: 03-06-1940 DOA: 12/29/2016 PCP: [name], MD    Brief Narrative:  76 year old female who presented after a cardiac arrest, ventricular fibrillation. Patient is known to have history of CVA with aphasia and wheelchair-bound, coronary artery disease status post bypass grafting, type 2 diabetes mellitus, GERD as hypertension, dyslipidemia and peripheral vascular disease. She developed an acute syncope episode while being on a bus, she was found in ventricular fibrillation cardiac arrest, she received electrical cardioversion x1 per EMS, with conversion to sinus rhythm and recovering spontaneous circulation and was brought into the hospital. She was apparently intubated on the field for airway protection. On initial physical examination, blood pressure 230/128, heart rate 53, temperature 97.1, respiratory 27, oxygen saturation 100% on 50% FiO2, invasive mechanical ventilation/ PRVC. She was sedated, heart S1-S2 present and rhythmic, lungs were clear to auscultation bilaterally, diminished breath sounds, abdomen was soft and nontender, no lower extremity edema. Sodium 139, potassium 3.2, chloride 105, bicarbonate 23, glucose 241, BUN 20, creatinine 1.41, magnesium 1.7, arterial blood gas, 7.29/ 56.3/ 234/ 27.4/ 100%. White cell count 16.0, hematocrit 47.0. Head CT with no acute findings, advanced chronic small vessel disease with mild progression in the deep gray matter. Her chest x-ray was clear for infiltrates, endotracheal tube about the carina. EKG sinus rhythm, 143 bpm, ST elevations in the inferior lateral leads, poor progression, normal axis and normal intervals.  Patient was admitted to the intensive care unit with the working diagnosis of ventricular fibrillation cardiac arrest, complicated by CNS related acute hypoxic/ hypercapnic respiratory failure.   Assessment & Plan:   Active Problems:   Cardiac arrest [facility])   Cardiac arrest with  ventricular fibrillation (HCC)   Respiratory failure ([place])  1. Status post cardiac arrest with NSTEMI, with new ejection fraction 25- 30%. Patient has been deemed poor prognosis, will continue antiplatelet therapy with aspiring and close blood pressure monitoring, will add low dose of carvedilol and lisinopril. Continue atorvastatin. No diureses needed at this point. No invasive intervention per cardiology.   2. CNS related acute hypoxic/hypercapnic respiratory failure. Patient oxygenating well, continue oxymetry monitoring and supplemental 02 per [place].   3. History of CVA. Today patient is able to communicate by writing, has chronic right hemiparesis, but aphasia is new. Will continue aspiration precautions, blood pressure control and antiplatelet therapy. Will need physical therapy evaluation.   4. Hypertension. Will change to oral b blockade and ace inh. Will discontinue IV fluids, follow a IV fluid restrictive strategy.   5. Type 2 diabetes mellitus. Insulin sliding scale for glucose cover and monitoring. capillary glucose up to 506, will continue sliding scale for now and will continue calculation of insulin requirements before starting basal insulin.    DVT prophylaxis: heparin  Code Status: full Family Communication: no family at the bedside Disposition Plan: home   Consultants:   Neurology   Procedures:     Antimicrobials:       Subjective: Patient more awake and interactive, she is able to communicate by handwriting, reports not remembering events that lead her to the hospital or her hospitalization. Has chronic right sided paresis and speech impairment, but now speech seems to be worse.    Objective: Vitals:   01/03/17 0338 01/03/17 0701 01/03/17 1100 01/03/17 1329  BP: 119/67 115/60 109/70 135/73  Pulse: 73 63 68 79  Resp: 18 20 18  (!) 24  Temp: 98 F (36.7 C) (!) 97.5 F (36.4 C) 97.6 F (36.4 C)  TempSrc: Oral Axillary Axillary   SpO2: 93% 97%  97% 100%  Weight: 71.8 kg (158 lb 3.2 oz)     Height:        Intake/Output Summary (Last 24 hours) at 01/03/2017 1350 Last data filed at 01/03/2017 1200 Gross per 24 hour  Intake 897.5 ml  Output -  Net 897.5 ml   Filed Weights   01/02/17 0438 01/02/17 1900 01/03/17 0338  Weight: 72.3 kg (159 lb 6.3 oz) 85.7 kg (189 lb) 71.8 kg (158 lb 3.2 oz)    Examination:   General: Not in pain or dyspnea, deconditioned Neurology: Awake and alert, has complete right sided hemiparesis.  E ENT: mild pallor, no icterus, oral mucosa moist Cardiovascular: No JVD. S1-S2 present, rhythmic, no gallops, rubs, or murmurs. No lower extremity edema. Pulmonary: vesicular breath sounds bilaterally, adequate air movement, no wheezing, rhonchi or rales. Gastrointestinal. Abdomen flat, no organomegaly, non tender, no rebound or guarding Skin. No rashes Musculoskeletal: no joint deformities     Data Reviewed: I have personally reviewed following labs and imaging studies  CBC: Recent Labs  Lab 12/29/16 1200 12/30/16 0425 12/31/16 0231 01/01/17 0459 01/02/17 0442  WBC  --  12.5* 9.7 6.3 6.8  NEUTROABS  --   --   --   --  4.7  HGB 16.0* 12.3 11.0* 9.4* 10.0*  HCT 47.0* 39.3 34.7* 31.4* 32.6*  MCV  --  85.1 85.0 87.2 86.2  PLT  --  174 123* 102* 063*   Basic Metabolic Panel: Recent Labs  Lab 12/29/16 1811  12/30/16 1447 12/31/16 0231 01/01/17 0459 01/01/17 0929 01/01/17 1742 01/02/17 0442 01/03/17 1111  NA 139   < >  --  136 138  --  139 140 138  K 3.2*   < >  --  3.6 3.4*  --  4.1 4.3 3.6  CL 105   < >  --  106 105  --  108 107 106  CO2 23   < >  --  24 26  --  26 23 26   GLUCOSE 241*   < >  --  190* 201*  --  167* 187* 258*  BUN 20   < >  --  12 9  --  9 11 11   CREATININE 1.41*   < >  --  0.92 0.91  --  0.84 0.93 0.91  CALCIUM 8.4*   < >  --  8.8* 8.9  --  9.0 9.2 9.0  MG 1.7  --  1.7 1.9  --  1.5*  --  2.1 1.5*  PHOS 4.5  --   --  2.5  --   --   --  1.9*  --    < > = values in  this interval not displayed.   GFR: Estimated Creatinine Clearance: 49.6 mL/min (by C-G formula based on SCr of 0.91 mg/dL). Liver Function Tests: Recent Labs  Lab 12/29/16 1811 12/31/16 0231 01/02/17 0442  AST 265* 46*  --   ALT 158* 78*  --   ALKPHOS 91 57  --   BILITOT 0.7 0.9  --   PROT 6.7 5.9*  --   ALBUMIN 3.3* 2.9* 3.1*   No results for input(s): LIPASE, AMYLASE in the last 168 hours. No results for input(s): AMMONIA in the last 168 hours. Coagulation Profile: No results for input(s): INR, PROTIME in the last 168 hours. Cardiac Enzymes: Recent Labs  Lab 12/29/16 1245 12/29/16 1811 12/30/16 0058  TROPONINI 0.05* <0.03  1.11*   BNP (last 3 results) No results for input(s): PROBNP in the last 8760 hours. HbA1C: No results for input(s): HGBA1C in the last 72 hours. CBG: Recent Labs  Lab 01/02/17 1953 01/02/17 2353 01/03/17 0344 01/03/17 0749 01/03/17 1150  GLUCAP 235* 215* 162* 151* 247*   Lipid Profile: No results for input(s): CHOL, HDL, LDLCALC, TRIG, CHOLHDL, LDLDIRECT in the last 72 hours. Thyroid Function Tests: No results for input(s): TSH, T4TOTAL, FREET4, T3FREE, THYROIDAB in the last 72 hours. Anemia Panel: No results for input(s): VITAMINB12, FOLATE, FERRITIN, TIBC, IRON, RETICCTPCT in the last 72 hours.    Radiology Studies: I have reviewed all of the imaging during this hospital visit personally     Scheduled Meds: . aspirin  300 mg Rectal Daily  . atorvastatin  40 mg Oral q1800  . Chlorhexidine Gluconate Cloth  6 each Topical Daily  . clopidogrel  75 mg Per Tube Daily  . heparin subcutaneous  5,000 Units Subcutaneous Q8H  . insulin aspart  0-9 Units Subcutaneous Q4H  . mouth rinse  15 mL Mouth Rinse BID  . metoprolol tartrate  2.5 mg Intravenous Q6H  . sodium chloride flush  10-40 mL Intracatheter Q12H   Continuous Infusions: . lactated ringers 50 mL/hr at 01/03/17 1334     LOS: 5 days        [name] Purnell Gerome Apley,  MD [facility] Pager [phone]

## 2017-01-04 DIAGNOSIS — Z7189 Other specified counseling: Secondary | ICD-10-CM

## 2017-01-04 DIAGNOSIS — Z515 Encounter for palliative care: Secondary | ICD-10-CM

## 2017-01-04 DIAGNOSIS — J9601 Acute respiratory failure with hypoxia: Secondary | ICD-10-CM

## 2017-01-04 DIAGNOSIS — I4901 Ventricular fibrillation: Secondary | ICD-10-CM

## 2017-01-04 DIAGNOSIS — L8990 Pressure ulcer of unspecified site, unspecified stage: Secondary | ICD-10-CM

## 2017-01-04 LAB — GLUCOSE, CAPILLARY
GLUCOSE-CAPILLARY: 122 mg/dL — AB (ref 65–99)
GLUCOSE-CAPILLARY: 214 mg/dL — AB (ref 65–99)
GLUCOSE-CAPILLARY: 223 mg/dL — AB (ref 65–99)
GLUCOSE-CAPILLARY: 244 mg/dL — AB (ref 65–99)
Glucose-Capillary: 143 mg/dL — ABNORMAL HIGH (ref 65–99)
Glucose-Capillary: 227 mg/dL — ABNORMAL HIGH (ref 65–99)
Glucose-Capillary: 44 mg/dL — CL (ref 65–99)

## 2017-01-04 MED ORDER — SPIRONOLACTONE 25 MG PO TABS
12.5000 mg | ORAL_TABLET | Freq: Every day | ORAL | Status: DC
  Administered 2017-01-04 – 2017-01-06 (×3): 12.5 mg via ORAL
  Filled 2017-01-04 (×3): qty 1

## 2017-01-04 MED ORDER — GLUCOSE 40 % PO GEL
ORAL | Status: AC
  Filled 2017-01-04: qty 1

## 2017-01-04 MED ORDER — DIPHENHYDRAMINE HCL 25 MG PO CAPS
25.0000 mg | ORAL_CAPSULE | Freq: Once | ORAL | Status: AC
  Administered 2017-01-04: 25 mg via ORAL
  Filled 2017-01-04: qty 1

## 2017-01-04 MED ORDER — ACETAMINOPHEN 325 MG PO TABS
650.0000 mg | ORAL_TABLET | Freq: Four times a day (QID) | ORAL | Status: DC | PRN
  Administered 2017-01-04: 650 mg via ORAL
  Filled 2017-01-04: qty 2

## 2017-01-04 NOTE — Progress Notes (Signed)
PROGRESS NOTE    [name] [name]  GEX:[id number] DOB: [birth year] DOA: 12/29/2016 PCP: [name], MD    Brief Narrative:  76 year old female who presented after a cardiac arrest, ventricular fibrillation. Patient is known to have history of CVA with aphasia and wheelchair-bound, coronary artery disease status post bypass grafting, type 2 diabetes mellitus, GERD as hypertension, dyslipidemia and peripheral vascular disease. She developed an acute syncope episode while being on a bus, she was found in ventricular fibrillation cardiac arrest, she received electrical cardioversion x1 per EMS, with conversion to sinus rhythm and recovering spontaneous circulation and was brought into the hospital. She was apparently intubated on the field for airway protection. On initial physical examination, blood pressure 230/128, heart rate 53, temperature 97.1, respiratory 27, oxygen saturation 100% on 50% FiO2, invasive mechanical ventilation/ PRVC. She was sedated, heart S1-S2 present and rhythmic, lungs were clear to auscultation bilaterally, diminished breath sounds, abdomen was soft and nontender, no lower extremity edema. Sodium 139, potassium 3.2, chloride 105, bicarbonate 23, glucose 241, BUN 20, creatinine 1.41, magnesium 1.7, arterial blood gas, 7.29/ 56.3/ 234/ 27.4/ 100%. White cell count 16.0, hematocrit 47.0. Head CT with no acute findings, advanced chronic small vessel disease with mild progression in the deep gray matter. Her chest x-ray was clear for infiltrates, endotracheal tube about the carina. EKG sinus rhythm, 143 bpm, ST elevations in the inferior lateral leads, poor progression, normal axis and normal intervals.  Patient was admitted to the intensive care unit with the working diagnosis of ventricular fibrillation cardiac arrest, complicated by CNS related acute hypoxic/ hypercapnic respiratory failure.     Assessment & Plan:   Active Problems:   Cardiac arrest [facility])   Cardiac arrest  with ventricular fibrillation (HCC)   Respiratory failure (HCC)   Pressure injury of skin  1. Status post cardiac arrestwith NSTEMI, with new ejection fraction 25- 30%. Patient tolerating well heart failure regimen with carvedilol, and low dose of lisinopril. Patient with orthopnea, will start aldactone for now. She may need furosemide at discharge. Poor prognosis per cardiology consultation.   2. CNS related acute hypoxic/hypercapnic respiratory failure. Continue oxygenating well, on supplemental 02 per . Aspiration precautions.   3. History of CVA with chronic right hemiparesis and new aphasia. Patient is able to communicate by handwriting very clearly. She has been seen by physical therapy with recommendations for SNF placement. Will continue antiplatelet therapy and blood pressure control. Choreic movements on left upper extremity attributed to new CVA/ hypoxic event.    4. Hypertension. Patient tolerating well b blockade and ace inh, with blood pressure 90 to 100.   5. Type 2 diabetes mellitus. Continue Insulin sliding scale for glucose cover and monitoring, had episode of hypoglycemia, will hold on basal insulin for now. Capillary glucose 44, 223, 12,, 143, 227.   DVT prophylaxis:heparin Code Status:full Family Communication:no family at the bedside Disposition Plan:home   Consultants:  Neurology  Procedures:    Antimicrobials:     Subjective: Patient reports orthopnea, no chest pain, no nausea or vomiting, tolerating po well. Patient willing to be discharge soon to snf.   Objective: Vitals:   01/04/17 0000 01/04/17 0351 01/04/17 0735 01/04/17 1116  BP: (!) 103/58 (!) 121/47 107/78 (!) 103/47  Pulse:  70 73 87  Resp: (!) 30 (!) 21    Temp:  98.7 F (37.1 C) 98.6 F (37 C)   TempSrc:  Oral Axillary   SpO2:  100% 94% 98%  Weight:  71.8 kg (158 lb 3.2 oz)  Height:        Intake/Output Summary (Last 24 hours) at 01/04/2017 1126 Last data  filed at 01/04/2017 0400 Gross per 24 hour  Intake 1248.33 ml  Output 84 ml  Net 1164.33 ml   Filed Weights   01/02/17 1900 01/03/17 0338 01/04/17 0351  Weight: 85.7 kg (189 lb) 71.8 kg (158 lb 3.2 oz) 71.8 kg (158 lb 3.2 oz)    Examination:   General: deconditioned Neurology: Awake, right hemiparesis and nonverbal, able to write with no impairment, she orientated and responds to questions appropriately.    E ENT: mild pallor, no icterus, oral mucosa moist Cardiovascular: No JVD. S1-S2 present, rhythmic, no gallops, rubs, or murmurs. Trace lower extremity edema. Pulmonary: vesicular breath sounds bilaterally, adequate air movement, no wheezing, rhonchi or rales. Mild decreased breath sounds at bases.  Gastrointestinal. Abdomen flat, no organomegaly, non tender, no rebound or guarding Skin. No rashes Musculoskeletal: no joint deformities     Data Reviewed: I have personally reviewed following labs and imaging studies  CBC: Recent Labs  Lab 12/29/16 1200 12/30/16 0425 12/31/16 0231 01/01/17 0459 01/02/17 0442  WBC  --  12.5* 9.7 6.3 6.8  NEUTROABS  --   --   --   --  4.7  HGB 16.0* 12.3 11.0* 9.4* 10.0*  HCT 47.0* 39.3 34.7* 31.4* 32.6*  MCV  --  85.1 85.0 87.2 86.2  PLT  --  174 123* 102* 528*   Basic Metabolic Panel: Recent Labs  Lab 12/29/16 1811  12/30/16 1447 12/31/16 0231 01/01/17 0459 01/01/17 0929 01/01/17 1742 01/02/17 0442 01/03/17 1111 01/03/17 1645  NA 139   < >  --  136 138  --  139 140 138  --   K 3.2*   < >  --  3.6 3.4*  --  4.1 4.3 3.6  --   CL 105   < >  --  106 105  --  108 107 106  --   CO2 23   < >  --  24 26  --  26 23 26   --   GLUCOSE 241*   < >  --  190* 201*  --  167* 187* 258* 336*  BUN 20   < >  --  12 9  --  9 11 11   --   CREATININE 1.41*   < >  --  0.92 0.91  --  0.84 0.93 0.91  --   CALCIUM 8.4*   < >  --  8.8* 8.9  --  9.0 9.2 9.0  --   MG 1.7  --  1.7 1.9  --  1.5*  --  2.1 1.5*  --   PHOS 4.5  --   --  2.5  --   --   --   1.9*  --   --    < > = values in this interval not displayed.   GFR: Estimated Creatinine Clearance: 49.6 mL/min (by C-G formula based on SCr of 0.91 mg/dL). Liver Function Tests: Recent Labs  Lab 12/29/16 1811 12/31/16 0231 01/02/17 0442  AST 265* 46*  --   ALT 158* 78*  --   ALKPHOS 91 57  --   BILITOT 0.7 0.9  --   PROT 6.7 5.9*  --   ALBUMIN 3.3* 2.9* 3.1*   No results for input(s): LIPASE, AMYLASE in the last 168 hours. No results for input(s): AMMONIA in the last 168 hours. Coagulation Profile: No results for input(s): INR,  PROTIME in the last 168 hours. Cardiac Enzymes: Recent Labs  Lab 12/29/16 1245 12/29/16 1811 12/30/16 0058  TROPONINI 0.05* <0.03 1.11*   BNP (last 3 results) No results for input(s): PROBNP in the last 8760 hours. HbA1C: No results for input(s): HGBA1C in the last 72 hours. CBG: Recent Labs  Lab 01/04/17 0004 01/04/17 0006 01/04/17 0055 01/04/17 0350 01/04/17 0734  GLUCAP 52* 44* 223* 122* 143*   Lipid Profile: No results for input(s): CHOL, HDL, LDLCALC, TRIG, CHOLHDL, LDLDIRECT in the last 72 hours. Thyroid Function Tests: No results for input(s): TSH, T4TOTAL, FREET4, T3FREE, THYROIDAB in the last 72 hours. Anemia Panel: No results for input(s): VITAMINB12, FOLATE, FERRITIN, TIBC, IRON, RETICCTPCT in the last 72 hours.    Radiology Studies: I have reviewed all of the imaging during this hospital visit personally     Scheduled Meds: . dextrose      . aspirin  81 mg Oral Daily  . atorvastatin  40 mg Oral q1800  . carvedilol  3.125 mg Oral BID WC  . heparin subcutaneous  5,000 Units Subcutaneous Q8H  . insulin aspart  0-9 Units Subcutaneous Q4H  . lisinopril  2.5 mg Oral Daily  . mouth rinse  15 mL Mouth Rinse BID  . sodium chloride flush  10-40 mL Intracatheter Q12H   Continuous Infusions:   LOS: 6 days        Tammy Wickliffe Gerome Apley, MD [facility] Pager [phone]

## 2017-01-04 NOTE — Progress Notes (Signed)
Daily Progress Note   Patient Name: [name] [name]       Date: 01/04/2017 DOB: January 25, 1941  Age: 76 y.o. MRN#: [id number] Attending Physician: Tawni Millers Primary Care Physician: [name], MD Admit Date: 12/29/2016  Reason for Consultation/Follow-up: Disposition, Establishing goals of care and Psychosocial/spiritual support  Subjective: [name] [name] is awake, alert, and fully interactive today. She has no acute complaints and expresses an eagerness for rehab and to rebuild her strength. Her only concern is finding palatable pureed food. Family was at the bedside.   Length of Stay: 6  Current Medications: Scheduled Meds:  . aspirin  81 mg Oral Daily  . atorvastatin  40 mg Oral q1800  . carvedilol  3.125 mg Oral BID WC  . heparin subcutaneous  5,000 Units Subcutaneous Q8H  . insulin aspart  0-9 Units Subcutaneous Q4H  . lisinopril  2.5 mg Oral Daily  . mouth rinse  15 mL Mouth Rinse BID  . sodium chloride flush  10-40 mL Intracatheter Q12H    Continuous Infusions:   PRN Meds: RESOURCE THICKENUP CLEAR, sodium chloride flush  Physical Exam  Constitutional: She is oriented to person, place, and time. She appears well-developed and well-nourished. No distress.  HENT:  Mouth/Throat: Oropharynx is clear and moist. No oropharyngeal exudate.  Right hemiparesis r/t prior CV. Tongue falls to right side. Impaired swallowing. Mild right facial droop.   Eyes: EOM are normal.  Cardiovascular: Normal rate and regular rhythm.  Pulmonary/Chest: Effort normal. She has rhonchi.  Strong wet cough  Abdominal: Soft. Bowel sounds are normal.  Musculoskeletal:  Right hemiparesis. Full strength and movement on left  Neurological: She is alert and oriented to person, place, and time.  Skin: Skin is warm and dry.  Psychiatric: She has  a normal mood and affect. Her behavior is normal. Judgment and thought content normal. She is noncommunicative (r/t stroke. She does write fluently.). She exhibits abnormal recent memory.           Vital Signs: BP 90/79 (BP Location: Right Arm)   Pulse 77   Temp 97.8 F (36.6 C) (Oral)   Resp 18   Ht 5' 2"  (1.575 m)   Wt 71.8 kg (158 lb 3.2 oz)   SpO2 98%   BMI 28.94 kg/m  SpO2: SpO2: 98 % O2 Device: O2 Device: Not Delivered O2 Flow Rate: O2 Flow Rate (L/min): 2 L/min  Intake/output summary:   Intake/Output Summary (Last 24 hours) at 01/04/2017 1224 Last data filed at 01/04/2017 0400 Gross per 24 hour  Intake 1008.33 ml  Output 84 ml  Net 924.33 ml   LBM: Last BM Date: 01/03/17 Baseline Weight: Weight: 75.4 kg (166 lb 5 oz) Most recent weight: Weight: 71.8 kg (158 lb 3.2 oz)  Palliative Assessment/Data: PPS 50%    Patient Active Problem List   Diagnosis Date Noted  . Pressure injury of skin 01/04/2017  . Cardiac arrest with ventricular fibrillation ([place])   . Respiratory failure ([place])   . Cardiac arrest ([place]) 12/29/2016  . Malnutrition of moderate degree 07/15/2016  . Diastolic CHF ([place]) 00/92/3300  . Ankle ulcer, right, limited to breakdown of skin ([place]) 07/12/2016  . Malignant neoplasm of lower-outer quadrant of  right breast of female, estrogen receptor positive ([place]) 05/28/2016  . Chronic combined systolic and diastolic CHF (congestive heart failure) ([place]) 12/01/2015  . Acute encephalopathy 12/01/2015  . Hypoglycemia 12/01/2015  . Elevated troponin 12/01/2015  . Diabetes mellitus without complication ([place])   . Hypothermia   . Rhonchi   . Acute respiratory failure ([place]) 01/28/2014  . Respiratory distress 01/27/2014  . Hypoxia 01/27/2014  . CHF exacerbation ([place]) 01/27/2014  . Acute on chronic systolic CHF (congestive heart failure) ([place]) 01/27/2014  . Systolic CHF, acute on chronic ([place]) 01/27/2014  . Increased anion gap metabolic acidosis 71/24/5809  .  Right foot ulcer ([place]) 01/27/2014  . Aspiration pneumonia ([place]) 06/23/2012  . Dysphagia 06/21/2012  . Palliative care encounter 06/20/2012  . Healthcare-associated pneumonia 06/16/2012  . TIA (transient ischemic attack) 05/05/2012  . N&V (nausea and vomiting) 05/04/2012  . Weakness generalized 05/04/2012  . 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 NSTEMI (non-ST elevated myocardial infarction) ([place]) 07/27/2011  . Diabetes mellitus ([place])   . Chest pain 03/10/2011  . HTN (hypertension) 03/10/2011  . Cerebral infarction ([place]) 03/10/2011  . CAD (coronary artery disease), native coronary artery 03/10/2011  . 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 S/P PTCA (percutaneous transluminal coronary angioplasty) 03/10/2011    Palliative Care Assessment & Plan   HPI: Pt is a 76yo female with a past medical history of CVA (with aphasia, dense right hemiplegia, and resultant wheelchair-bound), CAD s/p CABG, prior NSTEMIs,  PVD,  BRCA, DM2, GERD, HTN, and dyslipidemia. She had a syncopal episode while on the bus, with EMS finding her in ventricular fibrillation cardiac arrest. She was admitted 12/29/2016. VT/VF arrest secondary to NSTEMI. ECHO now with EF 25-30%. Cardiology following and feel there is little to offer from their standpoint and feel she has an "extremely poor and guarded prognosis." She has been successfully extubated and now cleared for a modified diet of puree with nectar thick liquids. Palliative consulted to assist pt and family in goals of care and subsequent trajectory of care.   Assessment: I met with [name] [name] at her bedside. Her two daughters and an additional family member were present. We talked through the information they had received from different providers. [name] [name], who did the majority of the talking, had an excellent grasp on her mother's multiple chronic health issues, as well as her significant barriers to improvement. She  also understood the two paths forward, one that follows a more aggressive approach, versus transitioning to a comfort focused on.   In talking directly with [name] [name], she wrote a very clear explanation of her goals: she wants to get stronger, get home, and she is not ready to die. She is agreeable to SNF/rehab, and views it as her best opportunity to rebuild her strength and get closer to her prior functional status wherein she could more easily transfer into her wheelchair. She also indicated the plan to return to the hospital as needed for additional care.   In terms of code status, Mrs. Gamboa feels like she has not yet had a chance to fully process her options and is not ready to change to a DNR. She has been starting to talk with [name] [name] about the recommendation for DNR (most notably made by Dr. Einar Gip who they have a very good relationship with). At this point she would like to remain a Full Code, accepts the interventions that would ensue with that, and will continue to think about  it and discuss it with her family.    Recommendations/Plan:  Full code, full scope aggressive care  Plan to D/C to SNF/Rehab with Palliative once medically cleared   Goals of Care and Additional Recommendations:  Limitations on Scope of Treatment: Full Scope Treatment  Code Status:  Full code  Prognosis:   Unable to determine  Discharge Planning:  [place] for rehab with Palliative care service follow-up  Care plan was discussed with pt and family.  Thank you for allowing the Palliative Medicine Team to assist in the care of this patient.   Time in: 1300 Time out: 1350 Total time: 65 minutes  Greater than 50%  of this time was spent counseling and coordinating care related to the above assessment and plan.  Charlynn Court, NP Palliative Medicine Team [phone] pager (7a-5p) Team Phone # [phone]

## 2017-01-04 NOTE — Progress Notes (Signed)
Night coverage team made aware patient refused am labs, hypoglycemic event CBG now 122, patient asymptomatic.  Patient educated in presence of phlebotomy and by RN alone on importance of blood testing per severity of hospitalization and current diagnoses.  Patient wrote lengthy note she did not want to be stuck.

## 2017-01-04 NOTE — Progress Notes (Signed)
Hypoglycemic Event  CBG: 44  Treatment: 15 GM carbohydrate snack  Symptoms: None  Follow-up CBG: Time:00:55 CBG Result:223  Possible Reasons for Event: Inadequate meal intake and Medication regimen: high dose of insulin given at dinner time.  Comments/MD notified:n/A    [place]

## 2017-01-05 DIAGNOSIS — I214 Non-ST elevation (NSTEMI) myocardial infarction: Principal | ICD-10-CM

## 2017-01-05 DIAGNOSIS — I5021 Acute systolic (congestive) heart failure: Secondary | ICD-10-CM

## 2017-01-05 LAB — GLUCOSE, CAPILLARY
GLUCOSE-CAPILLARY: 170 mg/dL — AB (ref 65–99)
GLUCOSE-CAPILLARY: 199 mg/dL — AB (ref 65–99)
Glucose-Capillary: 157 mg/dL — ABNORMAL HIGH (ref 65–99)
Glucose-Capillary: 160 mg/dL — ABNORMAL HIGH (ref 65–99)
Glucose-Capillary: 193 mg/dL — ABNORMAL HIGH (ref 65–99)
Glucose-Capillary: 194 mg/dL — ABNORMAL HIGH (ref 65–99)

## 2017-01-05 MED ORDER — CLOPIDOGREL BISULFATE 75 MG PO TABS
75.0000 mg | ORAL_TABLET | Freq: Every day | ORAL | Status: DC
  Administered 2017-01-05 – 2017-01-06 (×2): 75 mg via ORAL
  Filled 2017-01-05 (×3): qty 1

## 2017-01-05 MED ORDER — LISINOPRIL 10 MG PO TABS
10.0000 mg | ORAL_TABLET | Freq: Every day | ORAL | Status: DC
  Administered 2017-01-05 – 2017-01-06 (×2): 10 mg via ORAL
  Filled 2017-01-05 (×3): qty 1

## 2017-01-05 MED ORDER — GLUCERNA SHAKE PO LIQD
237.0000 mL | Freq: Three times a day (TID) | ORAL | Status: DC
  Administered 2017-01-05 – 2017-01-06 (×4): 237 mL via ORAL

## 2017-01-05 NOTE — Progress Notes (Signed)
Tylene Fantasia with [facility] paged the following:  6E19:[place],G:No labs since 11/10. 11/10:K:3.6 (prev 11/9:4.3). 11/10:Mag:1.5 (prev 11/9:2.1). AM Labs? Replace?

## 2017-01-05 NOTE — Progress Notes (Addendum)
Nutrition Follow Up  DOCUMENTATION CODES:   Not applicable  INTERVENTION:    Glucerna Shake po TID, each supplement provides 220 kcal and 10 grams of protein  NUTRITION DIAGNOSIS:   Inadequate oral intake now related to dysphagia as evidenced by average meal completion < 50%, ongoing  GOAL:   Patient will meet greater than or equal to 90% of their needs, progressing  MONITOR:   PO intake, Supplement acceptance, Labs, Skin, Weight trends  ASSESSMENT:   76 yo female with PMH of stroke, HTN, HLD, GERD, MI, PVD, DM, CHF who was admitted on 11/5 with V fib cardiac arrest, unknown time to resuscitation; also had torsades arrest in the early AM of 11/6.  Pt extubated 11/7. S/p Modified Barium Swallow 11/9. Pt demonstrated severe dysphagia. PO intake has been variable at 10-100% per flowsheet records. Labs and medications reviewed. Mg 1.5 (L). CBG's [id number].  Diet Order:  DIET - DYS 1 Room service appropriate? Yes; Fluid consistency: Nectar Thick  EDUCATION NEEDS:   No education needs have been identified at this time  Skin:  Skin Assessment: Skin Integrity Issues: Skin Integrity Issues:: (venous stasis ulcer to R leg; R groin incision)  Last BM:  11/10  Height:   Ht Readings from Last 1 Encounters:  12/29/16 5\' 2"  (1.575 m)    Weight:   Wt Readings from Last 1 Encounters:  01/05/17 163 lb 2.3 oz (74 kg)    Ideal Body Weight:  50 kg  BMI:  Body mass index is 29.84 kg/m.  Estimated Nutritional Needs:   Kcal:  1650-1850  Protein:  80-95 gm  Fluid:  1.6-1.8 L  Arthur Holms, RD, LDN Pager #: [phone] After-Hours Pager #: [phone]

## 2017-01-05 NOTE — Plan of Care (Signed)
Bed in low position, bed alarm on,non skid socks on, call light in reach, hourly rounds done. 

## 2017-01-05 NOTE — Progress Notes (Addendum)
Subjective:  Alert and now very close to her baseline and communicated well with writing  Objective:  Vital Signs in the last 24 hours: Temp:  [97.8 F (36.6 C)-98.8 F (37.1 C)] 98.8 F (37.1 C) (11/12 0721) Pulse Rate:  [65-87] 65 (11/12 0721) Resp:  [18-27] 18 (11/12 0721) BP: (84-145)/(47-85) 110/54 (11/12 0721) SpO2:  [98 %-100 %] 100 % (11/12 0721) Weight:  [74 kg (163 lb 2.3 oz)] 74 kg (163 lb 2.3 oz) (11/12 0616)  Intake/Output from previous day: 11/11 0701 - 11/12 0700 In: 460 [P.O.:450; I.V.:10] Out: 1 [Urine:1] Intake/Output from this shift: No intake/output data recorded.  Blood pressure (!) 110/54, pulse 65, temperature 98.8 F (37.1 C), temperature source Oral, resp. rate 18, height 5\' 2"  (1.575 m), weight 74 kg (163 lb 2.3 oz), SpO2 100 %.  Physical Exam: Constitutional:Chronically ill looking, who is unresponsive. HENT:  Head:Atraumatic.  Mouth/Throat: ET tube in place Cardiovascular: S1 and S2 normal,  No gallop heard. Respiratory:Effort normaland breath sounds normal.  TM:LYYT.Bowel sounds are normal.  Musculoskeletal:Chronically ischemic looking, no ulceration, thick skin, 2+ bilateral leg edema right worse than the left. Vascular: Absent pedal pulse bilateral. Healed leg /ankle ulcer noted.   Lab Results: No results for input(s): WBC, HGB, PLT in the last 72 hours. Recent Labs    01/03/17 1111 01/03/17 1645  NA 138  --   K 3.6  --   CL 106  --   CO2 26  --   GLUCOSE 258* 336*  BUN 11  --   CREATININE 0.91  --    Cardiac Studies:  Echo 12/29/2016: Left ventricle: The cavity size was normal. There was mild concentric hypertrophy. Systolic function was severely reduced. The estimated ejection fraction was in the range of 25% to 30%. Regional wall motion abnormalities: Akinesis of the anterolateral myocardium. Severe hypokinesis of the basal-midanterior myocardium. The study is not technically sufficient to allow evaluation of  LV diastolic function.  EKG 12/31/2016: Normal sinus rhythm with rate of 72 bpm, LVH with repolarization abnormality, cannot exclude inferior and anterolateral ischemia versus subendocardial infarct.  Compared to 12/29/2016, Global 3 mm ST segment depression not present.  Scheduled Meds: . aspirin  81 mg Oral Daily  . atorvastatin  40 mg Oral q1800  . carvedilol  3.125 mg Oral BID WC  . heparin subcutaneous  5,000 Units Subcutaneous Q8H  . insulin aspart  0-9 Units Subcutaneous Q4H  . lisinopril  2.5 mg Oral Daily  . mouth rinse  15 mL Mouth Rinse BID  . sodium chloride flush  10-40 mL Intracatheter Q12H  . spironolactone  12.5 mg Oral Daily   Continuous Infusions: PRN Meds:.acetaminophen, RESOURCE THICKENUP CLEAR, sodium chloride flush   Assessment/Plan:  1. VT/ VF arrest due to NSTEMI 2. 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 History of stroke with dense right hemiplegia 4. Hypertension 5. Diabetes mellitus type 2 controlled with stage III chronic kidney disease, with hyperglycemia 6. Ischemic cardiomyopathy with severe LV systolic dysfunction without clinical e/o CHF.  Rec:  Will increase dose acei today.  Lisinopril 10 mg daily. Add Plavix for NSTEMI. Now able to swallow pureed diet.   LOS: 7 days    [name] Prows 01/05/2017, 9:02 AM

## 2017-01-05 NOTE — Progress Notes (Signed)
PROGRESS NOTE    [name] [name]  XTK:[id number] DOB: 12-13-1940 DOA: 12/29/2016 PCP: [name], MD    Brief Narrative:  76 year old female who presented after a cardiac arrest, ventricular fibrillation. Patient is known to have history of CVA with aphasia and wheelchair-bound, coronary artery disease status post bypass grafting, type 2 diabetes mellitus, GERD as hypertension, dyslipidemia and peripheral vascular disease. She developed an acute syncope episode while being on a bus, she was found in ventricular fibrillation cardiac arrest, she received electrical cardioversion x1 per EMS, with conversion to sinus rhythm and recovering spontaneous circulation and was brought into the hospital. She was apparently intubated on the field for airway protection. On initial physical examination, blood pressure 230/128, heart rate 53, temperature 97.1, respiratory 27, oxygen saturation 100% on 50% FiO2, invasive mechanical ventilation/ PRVC. She was sedated, heart S1-S2 present and rhythmic, lungs were clear to auscultation bilaterally, diminished breath sounds, abdomen was soft and nontender, no lower extremity edema. Sodium 139, potassium 3.2, chloride 105, bicarbonate 23, glucose 241, BUN 20, creatinine 1.41, magnesium 1.7, arterial blood gas, 7.29/ 56.3/ 234/ 27.4/ 100%. White cell count 16.0, hematocrit 47.0. Head CT with no acute findings, advanced chronic small vessel disease with mild progression in the deep gray matter. Her chest x-ray was clear for infiltrates, endotracheal tube about the carina. EKG sinus rhythm, 143 bpm, ST elevations in the inferior lateral leads, poor progression, normal axis and normal intervals.  Patient was admitted to the intensive care unit with the working diagnosis of ventricular fibrillation cardiac arrest, complicated by CNS related acute hypoxic/ hypercapnic respiratory failure.     Assessment & Plan:   Active Problems:   Cardiac arrest [facility])   Cardiac arrest  with ventricular fibrillation (HCC)   Respiratory failure (HCC)   Pressure injury of skin   Goals of care, counseling/discussion   Palliative care by specialist   1. Status post cardiac arrestwith NSTEMI, with new ejection fraction25- 30%. Continue heart failure regimen with carvedilol, lisinopril and aldactone, will resume furosemide to keep negative fluid balance. Telemetry with sinus rhythm. Antiplatelet therapy with asa and clopidogrel.   2. CNS related acute hypoxic/hypercapnic respiratory failure.It has resolved.    3. History of CVA with chronic right hemiparesis and new aphasia. Will follow with physical therapy evaluation, patient will likely will need snf at discharge. Will continue dual antiplatelet therapy, statin and blood pressure control.   4. Hypertension.will continue blood pressure control with coreg and lisniipril, dose increased to 10 mg with good toleration.    5. Type 2 diabetes mellitus.No further hypoglycemic episode, capillary glucose 244, 194, 157, 160, 193. Will continue sliding scale for glucose control, will hold on basal insulin for now due to risk of hypoglycemia.   DVT prophylaxis:heparin Code Status:full Family Communication:no family at the bedside Disposition Plan:SNF   Consultants:  Neurology  Cardiology   Procedures:    Antimicrobials:     Subjective: Patient feeling well, having difficulty taking her medications per po and asking for assistance in taking medications, no chest pain, no dyspnea or palpitations.   Objective: Vitals:   01/05/17 0418 01/05/17 0603 01/05/17 0616 01/05/17 0721  BP:   112/69 (!) 110/54  Pulse: 77 72 73 65  Resp: (!) 24 (!) 25 (!) 21 18  Temp:   98.5 F (36.9 C) 98.8 F (37.1 C)  TempSrc:   Axillary Oral  SpO2: 100% 100% 100% 100%  Weight:   74 kg (163 lb 2.3 oz)   Height:  Intake/Output Summary (Last 24 hours) at 01/05/2017 1120 Last data filed at 01/05/2017 0355 Gross  per 24 hour  Intake 360 ml  Output 1 ml  Net 359 ml   Filed Weights   01/03/17 0338 01/04/17 0351 01/05/17 0616  Weight: 71.8 kg (158 lb 3.2 oz) 71.8 kg (158 lb 3.2 oz) 74 kg (163 lb 2.3 oz)    Examination:   General: Not in pain or dyspnea, deconditioned Neurology: Awake and alert, right sided hemiparesis.  E ENT: no pallor, no icterus, oral mucosa moist Cardiovascular: No JVD. S1-S2 present, rhythmic, no gallops, rubs, or murmurs. No lower extremity edema. Pulmonary: vesicular breath sounds bilaterally, adequate air movement, no wheezing, rhonchi or rales. Gastrointestinal. Abdomen flat, no organomegaly, non tender, no rebound or guarding Skin. No rashes Musculoskeletal: no joint deformities     Data Reviewed: I have personally reviewed following labs and imaging studies  CBC: Recent Labs  Lab 12/29/16 1200 12/30/16 0425 12/31/16 0231 01/01/17 0459 01/02/17 0442  WBC  --  12.5* 9.7 6.3 6.8  NEUTROABS  --   --   --   --  4.7  HGB 16.0* 12.3 11.0* 9.4* 10.0*  HCT 47.0* 39.3 34.7* 31.4* 32.6*  MCV  --  85.1 85.0 87.2 86.2  PLT  --  174 123* 102* 993*   Basic Metabolic Panel: Recent Labs  Lab 12/29/16 1811  12/30/16 1447 12/31/16 0231 01/01/17 0459 01/01/17 0929 01/01/17 1742 01/02/17 0442 01/03/17 1111 01/03/17 1645  NA 139   < >  --  136 138  --  139 140 138  --   K 3.2*   < >  --  3.6 3.4*  --  4.1 4.3 3.6  --   CL 105   < >  --  106 105  --  108 107 106  --   CO2 23   < >  --  24 26  --  26 23 26   --   GLUCOSE 241*   < >  --  190* 201*  --  167* 187* 258* 336*  BUN 20   < >  --  12 9  --  9 11 11   --   CREATININE 1.41*   < >  --  0.92 0.91  --  0.84 0.93 0.91  --   CALCIUM 8.4*   < >  --  8.8* 8.9  --  9.0 9.2 9.0  --   MG 1.7  --  1.7 1.9  --  1.5*  --  2.1 1.5*  --   PHOS 4.5  --   --  2.5  --   --   --  1.9*  --   --    < > = values in this interval not displayed.   GFR: Estimated Creatinine Clearance: 50.3 mL/min (by C-G formula based on SCr of  0.91 mg/dL). Liver Function Tests: Recent Labs  Lab 12/29/16 1811 12/31/16 0231 01/02/17 0442  AST 265* 46*  --   ALT 158* 78*  --   ALKPHOS 91 57  --   BILITOT 0.7 0.9  --   PROT 6.7 5.9*  --   ALBUMIN 3.3* 2.9* 3.1*   No results for input(s): LIPASE, AMYLASE in the last 168 hours. No results for input(s): AMMONIA in the last 168 hours. Coagulation Profile: No results for input(s): INR, PROTIME in the last 168 hours. Cardiac Enzymes: Recent Labs  Lab 12/29/16 1245 12/29/16 1811 12/30/16 0058  TROPONINI 0.05* <0.03  1.11*   BNP (last 3 results) No results for input(s): PROBNP in the last 8760 hours. HbA1C: No results for input(s): HGBA1C in the last 72 hours. CBG: Recent Labs  Lab 01/04/17 1621 01/04/17 2016 01/05/17 0017 01/05/17 0353 01/05/17 0720  GLUCAP 214* 244* 194* 157* 160*   Lipid Profile: No results for input(s): CHOL, HDL, LDLCALC, TRIG, CHOLHDL, LDLDIRECT in the last 72 hours. Thyroid Function Tests: No results for input(s): TSH, T4TOTAL, FREET4, T3FREE, THYROIDAB in the last 72 hours. Anemia Panel: No results for input(s): VITAMINB12, FOLATE, FERRITIN, TIBC, IRON, RETICCTPCT in the last 72 hours.    Radiology Studies: I have reviewed all of the imaging during this hospital visit personally     Scheduled Meds: . aspirin  81 mg Oral Daily  . atorvastatin  40 mg Oral q1800  . carvedilol  3.125 mg Oral BID WC  . clopidogrel  75 mg Oral Daily  . heparin subcutaneous  5,000 Units Subcutaneous Q8H  . insulin aspart  0-9 Units Subcutaneous Q4H  . lisinopril  10 mg Oral Daily  . mouth rinse  15 mL Mouth Rinse BID  . sodium chloride flush  10-40 mL Intracatheter Q12H  . spironolactone  12.5 mg Oral Daily   Continuous Infusions:   LOS: 7 days        Stepehn Eckard Gerome Apley, MD [facility] Pager [phone]

## 2017-01-05 NOTE — Progress Notes (Signed)
Physical Therapy Treatment Patient Details Name: [name] [name] MRN: [id number] DOB: 08-13-1940 Today's Date: 01/05/2017    History of Present Illness Pt is a 76 y.o. female presenting after becoming unresponsive and was found to be in cardiac arrest. Pt was intubated 11/5-11/7. 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    PT Comments    Pt improving slowly, but is a joy to work with and participates hard with each activity.  Emphasis on transition to sit, scooting, standing activity and transfers.   Follow Up Recommendations  SNF     Equipment Recommendations  None recommended by PT    Recommendations for Other Services       Precautions / Restrictions Precautions Precautions: Fall    Mobility  Bed Mobility Overal bed mobility: Needs Assistance Bed Mobility: Sidelying to Sit   Sidelying to sit: Max assist;+2 for physical assistance       General bed mobility comments: worked on scooting to EOB pushing with L UE and rocking for forward momentum and with assist.  Cued and assisted to roll and come forward and up over R elbow.  Supported trunk while pt bore weight on L UE  Transfers Overall transfer level: Needs assistance Equipment used: 2 person hand held assist(chair back) Transfers: Sit to/from [facility] Sit to Stand: Max assist;+2 physical assistance;Mod assist Stand pivot transfers: Max assist;+2 physical assistance       General transfer comment: face to face assist used to support at right knee and give pt more chance to assist.  Ambulation/Gait                 Stairs            Wheelchair Mobility    Modified Rankin (Stroke Patients Only)       Balance Overall balance assessment: Needs assistance Sitting-balance support: Feet supported Sitting balance-Leahy Scale: Fair       Standing balance-Leahy Scale: Poor Standing  balance comment: stood at EOB x 45 secs with mild/moderate support at right knee and more truncal support., UE's supported on chair back.                            Cognition Arousal/Alertness: Awake/alert Behavior During Therapy: WFL for tasks assessed/performed Overall Cognitive Status: Within Functional Limits for tasks assessed                                        Exercises Other Exercises Other Exercises: hip/knee flexion/ext ROM exercise with resisted gross extension bil.    General Comments        Pertinent Vitals/Pain Pain Assessment: Faces Faces Pain Scale: Hurts little more Pain Location: right knee Pain Descriptors / Indicators: Grimacing Pain Intervention(s): Monitored during session    Home Living                      Prior Function            PT Goals (current goals can now be found in the care plan section) Acute Rehab PT Goals Patient Stated Goal: per daughter return to PLOF; pt very focused on getting something to drink at end of session PT Goal Formulation: With patient/family Time For Goal Achievement: 01/15/17 Potential to Achieve Goals: Fair Progress towards  PT goals: Progressing toward goals    Frequency    Min 2X/week      PT Plan Current plan remains appropriate    Co-evaluation              AM-PAC PT "6 Clicks" Daily Activity  Outcome Measure  Difficulty turning over in bed (including adjusting bedclothes, sheets and blankets)?: Unable Difficulty moving from lying on back to sitting on the side of the bed? : Unable Difficulty sitting down on and standing up from a chair with arms (e.g., wheelchair, bedside commode, etc,.)?: Unable Help needed moving to and from a bed to chair (including a wheelchair)?: A Lot Help needed walking in hospital room?: Total Help needed climbing 3-5 steps with a railing? : Total 6 Click Score: 7    End of Session   Activity Tolerance: Patient tolerated  treatment well Patient left: in chair;with call bell/phone within reach;with chair alarm set;with family/visitor present Nurse Communication: Mobility status PT Visit Diagnosis: Hemiplegia and hemiparesis;Other abnormalities of gait and mobility (R26.89) Hemiplegia - Right/Left: Right Hemiplegia - dominant/non-dominant: Non-dominant     Time: 1610-9604 PT Time Calculation (min) (ACUTE ONLY): 43 min  Charges:  $Therapeutic Exercise: 8-22 mins $Therapeutic Activity: 23-37 mins                    G Codes:       January 15, 2017  Donnella Sham, PT [phone]  (pager)   Tessie Fass Huckleberry Martinson 01/15/2017, 5:44 PM

## 2017-01-05 NOTE — Progress Notes (Signed)
  Speech Language Pathology Treatment: Dysphagia  Patient Details Name: [name] [name] MRN: [id number] DOB: [birth year] Today's Date: 01/05/2017 Time: 1219-7588 SLP Time Calculation (min) (ACUTE ONLY): 23 min  Assessment / Plan / Recommendation Clinical Impression  Pt communicated (via writing) her intention/desire to have ice in her nectar thick liquids after explaining increased risk of aspiration. Significant anterior spill present and is her baseline. Delayed cough following nectar via teaspoon and cup. She declined solids saying she is not hungry yet. [name] [name] will continue to be at high risk and likely have episodes of aspiration which diet/liquid modifications cannot prevent but hopefully mitigate. She is followed by Palliative care and this SLP agrees with her desire for various requests re: food however she is also a full code which she has began to speak with family about but desires to remain full code. ST will follow.   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 Patient also has recent diagnosis of breast cancer. Patient become unresponsive the date of admission while traveling with her grandson. Upon EMS arrival patient was in ventricular fibrillation. She underwent cardioversion with one shock; pt had cardiac arrest x2. Given the patient's multiple medical comorbidities the decision was made to continue with medical care. Pt intubated 11/5-11/7. Family reports coughing with eating at home; previous SLP notes show severe oral dysphagia at baseline with mild pharyngeal phase deficits.        SLP Plan  Continue with current plan of care       Recommendations  Diet recommendations: Nectar-thick liquid;Dysphagia 1 (puree) Liquids provided via: Teaspoon;Cup Medication Administration: Crushed with puree Supervision: Patient able to self feed;Staff to assist with self  feeding Compensations: Slow rate;Small sips/bites;Lingual sweep for clearance of pocketing Postural Changes and/or Swallow Maneuvers: Seated upright 90 degrees;Upright 30-60 min after meal                Oral Care Recommendations: Oral care BID Follow up Recommendations: Home health SLP SLP Visit Diagnosis: Dysphagia, oropharyngeal phase (R13.12);Dysphagia, oral phase (R13.11) Plan: Continue with current plan of care       GO                [name] [name] 01/05/2017, 12:36 PM  [name] [name].[employer] [phone]

## 2017-01-05 NOTE — Care Management Important Message (Signed)
Important Message  Patient Details  Name: Anndrea Mihelich MRN: [id number] Date of Birth: [birth year]   Medicare Important Message Given:  Yes    Jariel Drost 01/05/2017, 3:03 PM

## 2017-01-06 LAB — GLUCOSE, CAPILLARY
GLUCOSE-CAPILLARY: 217 mg/dL — AB (ref 65–99)
Glucose-Capillary: 178 mg/dL — ABNORMAL HIGH (ref 65–99)
Glucose-Capillary: 154 mg/dL — ABNORMAL HIGH (ref 65–99)

## 2017-01-06 MED ORDER — ORAL CARE MOUTH RINSE: 15.0000 mL | Freq: Two times a day (BID) | OROMUCOSAL | 0 refills | Status: AC

## 2017-01-06 MED ORDER — ASPIRIN 81 MG PO CHEW: 81.0000 mg | CHEWABLE_TABLET | Freq: Every day | ORAL | 0 refills | Status: AC

## 2017-01-06 MED ORDER — SPIRONOLACTONE 25 MG PO TABS: 12.5000 mg | ORAL_TABLET | Freq: Every day | ORAL | 0 refills | Status: AC

## 2017-01-06 MED ORDER — FUROSEMIDE 40 MG PO TABS: 40.0000 mg | ORAL_TABLET | Freq: Every day | ORAL | 0 refills | Status: AC

## 2017-01-06 MED ORDER — ATORVASTATIN CALCIUM 40 MG PO TABS: 40.0000 mg | ORAL_TABLET | Freq: Every day | ORAL | 0 refills | Status: AC

## 2017-01-06 MED ORDER — LISINOPRIL 10 MG PO TABS: 10.0000 mg | ORAL_TABLET | Freq: Every day | ORAL | 0 refills | Status: AC

## 2017-01-06 MED ORDER — INSULIN ASPART 100 UNIT/ML ~~LOC~~ SOLN: 0.0000 [IU] | Freq: Three times a day (TID) | SUBCUTANEOUS | 11 refills | Status: DC

## 2017-01-06 MED ORDER — GLUCERNA SHAKE PO LIQD: 237.0000 mL | Freq: Three times a day (TID) | ORAL | 0 refills | Status: DC

## 2017-01-06 MED ORDER — CARVEDILOL 3.125 MG PO TABS: 3.1250 mg | ORAL_TABLET | Freq: Two times a day (BID) | ORAL | 0 refills | Status: AC

## 2017-01-06 MED ORDER — FUROSEMIDE 40 MG PO TABS
40.0000 mg | ORAL_TABLET | Freq: Every day | ORAL | Status: DC
  Administered 2017-01-06: 40 mg via ORAL
  Filled 2017-01-06: qty 1

## 2017-01-06 NOTE — Progress Notes (Signed)
CSW confirmed patient's daughter completed paperwork at [facility] and then informed by nurse and MD patient refusing SNF. CSW met with patient at bedside and called patient's daughter, [name], and spoke with patient and daughter on speaker phone at length about SNF. Daughter discussed with patient and promised support at SNF, as patient worried about being mistreated there. Patient then agreeable to discharge to [facility].  Patient will discharge to [facility]. Anticipated discharge date: 01/06/17 Family notified: [name], daughter Transportation by: [facility]  Nurse to call report to [phone].   CSW signing off.  Estanislado Emms, Manuel Garcia  Clinical Social Worker

## 2017-01-06 NOTE — Clinical Social Work Placement (Signed)
   CLINICAL SOCIAL WORK PLACEMENT  NOTE  Date:  01/06/2017  Patient Details  Name: [name] [name] MRN: [id number] Date of Birth: 03-17-1940  Clinical Social Work is seeking post-discharge placement for this patient at the [place] level of care (*CSW will initial, date and re-position this form in  chart as items are completed):  Yes   Patient/family provided with [facility] Work Department's list of facilities offering this level of care within the geographic area requested by the patient (or if unable, by the patient's family).  Yes   Patient/family informed of their freedom to choose among providers that offer the needed level of care, that participate in Medicare, Medicaid or managed care program needed by the patient, have an available bed and are willing to accept the patient.  Yes   Patient/family informed of [facility]'s ownership interest in [facility] and [facility], as well as of the fact that they are under no obligation to receive care at these facilities.  PASRR submitted to EDS on 01/02/17     PASRR number received on 01/02/17     Existing PASRR number confirmed on       FL2 transmitted to all facilities in geographic area requested by pt/family on 01/02/17     FL2 transmitted to all facilities within larger geographic area on       Patient informed that his/her managed care company has contracts with or will negotiate with certain facilities, including the following:  [facility]     Yes   Patient/family informed of bed offers received.  Patient chooses bed at [facility]     Physician recommends and patient chooses bed at      Patient to be transferred to [facility] on 01/06/17.  Patient to be transferred to facility by [facility]     Patient family notified on 01/06/17 of transfer.  Name of family member notified:  [name], daughter     PHYSICIAN Please prepare priority  discharge summary, including medications, Please prepare prescriptions, Please sign FL2     Additional Comment:    _______________________________________________ Estanislado Emms, LCSW 01/06/2017, 11:20 AM

## 2017-01-06 NOTE — Progress Notes (Signed)
[name] [name] was admitted to the hospital today so she did "not-show"

## 2017-01-06 NOTE — Progress Notes (Signed)
CSW received [facility] authorization for patient. Auth #[id number]. CSW shared auth information with patient's chosen SNF, [facility]. Patient's daughter, [name], completing intake paperwork at SNF this morning. CSW to support with discharge.  Estanislado Emms, [place]

## 2017-01-06 NOTE — Discharge Summary (Signed)
Physician Discharge Summary  [name] [name] JJH:[id number] DOB: 1941/01/10 DOA: 12/29/2016  PCP: [name], MD  Admit date: 12/29/2016 Discharge date: 01/06/2017  Admitted From: Home Disposition:  SNF  Recommendations for Outpatient Follow-up:  1. Follow up with PCP in 1- week 2. Patient has been placed on heart failure regimen with carvedilol, lisinopril and aldactone. 3. Diuresis with furosemide 40 mg daily to target negative fluid balance. 4. Patient will be placed on insulin sliding scale for glucose monitoring  Home Health: NA Equipment/Devices: NA   Discharge Condition: Stable CODE STATUS: Full  Diet recommendation: Heart healthy and diabetic prudent  Brief/Interim Summary: 76 year old female who presented after a cardiac arrest, ventricular fibrillation. Patient is known to have history of CVA with aphasia and wheelchair-bound, coronary artery disease status post bypass grafting, type 2 diabetes mellitus, GERD, hypertension, dyslipidemia and peripheral vascular disease. She developed an acute syncope episode while being on a bus, she was found in ventricular fibrillation cardiac arrest, she received electrical cardioversion x1 per EMS, with conversion to sinus rhythm and recovering spontaneous circulation then she was brought into the hospital. She was apparently intubated on the field for airway protection. On initial physical examination, blood pressure 230/128, heart rate 53, temperature 97.1, respiratory rate 27, oxygen saturation 100% on 50% FiO2, invasive mechanical ventilation/ PRVC. She was sedated, heart S1-S2 present and rhythmic, lungs were clear to auscultation bilaterally, but diminished breath sounds, abdomen was soft and nontender, no lower extremity edema. Sodium 139, potassium 3.2, chloride 105, bicarbonate 23, glucose 241, BUN 20, creatinine 1.41, magnesium 1.7, arterial blood gas, 7.29/ 56.3/ 234/ 27.4/ 100%. White cell count 16.0, hematocrit 47.0. Head CT with no  acute findings, advanced chronic small vessel disease with mild progression in the deep gray matter. Her chest x-ray was clear for infiltrates, endotracheal tube above the carina. EKG sinus rhythm, 143 bpm, ST elevations in the inferior lateral leads, poor R wave progression, normal axis and normal intervals.  Patient was admitted to the intensive care unit with the working diagnosis of ventricular fibrillation cardiac arrest, complicated by CNS related acute hypoxic/ hypercapnic respiratory failure.  1. Status post ventricular fibrillation cardiac arrest related to a non-STEMI, with acute on chronic systolic heart failure, NEW ejection fraction 25-30%. Patient was admitted to the intensive care unit, she required vasopressors to maintain hemodynamic stability, follow-up echocardiography showed a drop on her LV systolic function from 81-85% down to 25-30%, with new akinesis of the anterolateral myocardium with chronic severe hypokinesis at the base and mid anterior myocardium. She was successfully weaned off vasopressors. Patient was seen by cardiology, overall very poor prognosis, the patient was medically treated with aspirin, clopidogrel, statin and posteriorly added beta-blockade, ACE inhibitor, and Aldactone with good toleration. Patient will resume diuresis with furosemide 40 mg daily. Her discharge blood pressure is 631 systolic.  2. CNS-related acute hypoxic/hypercapnic respiratory failure. Patient was admitted to the intensive care unit on invasive mechanical ventilation, she was successfully liberated from mechanical ventilation on November 7, and she was successfully transitioned to nasal cannula, her oximetry saturation at discharge is 92% on room air.  3. Acute ischemic CVA due to brain hypoperfusion related to cardiac arrest, in the setting of chronic CVA. Patient was able to be liberated from mechanical ventilation successfully, she had speech evaluation, with initial recommendation to keep  patient nothing by mouth, through the course of her hospitalization her swallowing improved to the point where she can take oral medications and eat by mouth. She still not able to  speak but able to communicate by hand writing. Patient was noted to have choreicform movements on her left upper extremity likely related to basal ganglia hypoxic injury. Patient was seen by physical therapy, recommendation is to discharge to skilled nursing facility, to continue rehabilitation.   4. Type 2 diabetes mellitus. Patient was placed on insulin sliding scale for glucose coverage and monitoring, her capillary glucose has remained stable over last 24 hours.   5. Hypertension. Patient has tolerated well carvedilol and lisinopril.     Discharge Diagnoses:  Active Problems:   Cardiac arrest [facility])   Cardiac arrest with ventricular fibrillation (HCC)   Respiratory failure (HCC)   Pressure injury of skin   Goals of care, counseling/discussion   Palliative care by specialist    Discharge Instructions   Allergies as of 01/06/2017      Reactions   Penicillins Swelling   Swelling of mouth Has patient had a PCN reaction causing immediate rash, facial/tongue/throat swelling, SOB or lightheadedness with hypotension: YES Has patient had a PCN reaction causing severe rash involving mucus membranes or skin necrosis: NO Has patient had a PCN reaction that required hospitalization NO Has patient had a PCN reaction occurring within the last 10 years: NO If all of the above answers are "NO", then may proceed with Cephalosporin use.      Medication List    STOP taking these medications   insulin glargine 100 UNIT/ML injection Commonly known as:  LANTUS   lidocaine 5 % ointment Commonly known as:  XYLOCAINE   metoprolol tartrate 25 MG tablet Commonly known as:  LOPRESSOR   potassium chloride SA 20 MEQ tablet Commonly known as:  K-DUR,KLOR-CON     TAKE these medications   anastrozole 1 MG  tablet Commonly known as:  ARIMIDEX Take 1 tablet (1 mg total) by mouth daily.   aspirin 81 MG chewable tablet Chew 1 tablet (81 mg total) daily by mouth. Start taking on:  01/07/2017   atorvastatin 40 MG tablet Commonly known as:  LIPITOR Take 1 tablet (40 mg total) daily at 6 PM by mouth.   carvedilol 3.125 MG tablet Commonly known as:  COREG Take 1 tablet (3.125 mg total) 2 (two) times daily with a meal by mouth.   cholecalciferol 1000 units tablet Commonly known as:  VITAMIN D Take 1,000 Units by mouth daily.   clopidogrel 75 MG tablet Commonly known as:  PLAVIX Take 75 mg by mouth daily.   escitalopram 20 MG tablet Commonly known as:  LEXAPRO Take 20 mg by mouth daily.   feeding supplement (GLUCERNA SHAKE) Liqd Take 237 mLs 3 (three) times daily between meals by mouth.   furosemide 40 MG tablet Commonly known as:  LASIX Take 1 tablet (40 mg total) daily by mouth. What changed:  when to take this   gabapentin 100 MG capsule Commonly known as:  NEURONTIN Take 1 capsule (100 mg total) by mouth 3 (three) times daily.   insulin aspart 100 UNIT/ML injection Commonly known as:  novoLOG Inject 0-9 Units 3 (three) times daily with meals into the skin. For glucose 150 to 200 use 2 units, for glucose 201 to 250 use 4 units, for 251 to 300 use 6 units, for 301 to 350 use 8 units, for 351 or greater use 10 units. What changed:    how much to take  additional instructions   lisinopril 10 MG tablet Commonly known as:  PRINIVIL,ZESTRIL Take 1 tablet (10 mg total) daily by mouth. Start taking  on:  01/07/2017 What changed:    medication strength  how much to take   mouth rinse Liqd solution 15 mLs 2 (two) times daily for 15 days by Mouth Rinse route.   nitroGLYCERIN 0.4 MG SL tablet Commonly known as:  NITROSTAT Place 0.4 mg under the tongue every 5 (five) minutes as needed for chest pain.   rosuvastatin 40 MG tablet Commonly known as:  CRESTOR Take 40 mg by  mouth daily.   spironolactone 25 MG tablet Commonly known as:  ALDACTONE Take 0.5 tablets (12.5 mg total) daily by mouth. Start taking on:  01/07/2017      Contact information for after-discharge care    Destination    [facility] .   Service:  Skilled Nursing Contact information: [place] [phone]             Allergies  Allergen Reactions  . Penicillins Swelling    Swelling of mouth Has patient had a PCN reaction causing immediate rash, facial/tongue/throat swelling, SOB or lightheadedness with hypotension: YES Has patient had a PCN reaction causing severe rash involving mucus membranes or skin necrosis: NO Has patient had a PCN reaction that required hospitalization NO Has patient had a PCN reaction occurring within the last 10 years: NO If all of the above answers are "NO", then may proceed with Cephalosporin use.    Consultations:  Cardiology  Neurology    Procedures/Studies: Ct Head Wo Contrast  Result Date: 12/29/2016 CLINICAL DATA:  76 year old female became unresponsive on the bus. V fib cardiac arrest and subsequent resuscitation. Remains unresponsive. EXAM: CT HEAD WITHOUT CONTRAST TECHNIQUE: Contiguous axial images were obtained from the base of the skull through the vertex without intravenous contrast. COMPARISON:  Head CTs 05/04/2012 and earlier. FINDINGS: Brain: Chronic hypodensity in the bilateral corona radiata, basal ganglia and deep white matter capsules compatible with chronic small vessel ischemia. Heterogeneity in the thalami appears mildly progressed since prior studies. Similar cerebral volume overall. Additional patchy bilateral cerebral white matter hypodensity. Small chronic lacunar infarct in the inferior right cerebellum is stable. No midline shift, ventriculomegaly, mass effect, evidence of mass lesion, intracranial hemorrhage or evidence of cortically based acute  infarction. Vascular: Extensive Calcified atherosclerosis at the skull base. No suspicious intracranial vascular hyperdensity. Frequent dural calcifications. Skull: Stable.  No acute osseous abnormality identified. Sinuses/Orbits: The patient appears intubated on the scout view. Opacified posterior nasal cavity and visible nasopharynx. Mild mucosal thickening in the posterior ethmoids and maxillary sinuses. Superimposed small right maxillary mucous retention cyst which appears stable since 2013. Tympanic cavities and mastoids remain clear. Other: No acute orbit or scalp soft tissue findings. IMPRESSION: 1.  No acute intracranial abnormality identified. 2. Advanced chronic small vessel disease with mild progression suspected in the deep gray matter since 2014. 3. Intubation related fluid in the pharynx. Electronically Signed   By: Genevie Ann M.D.   On: 12/29/2016 12:56   Dg Chest Port 1 View  Result Date: 12/31/2016 CLINICAL DATA:  Respiratory failure EXAM: PORTABLE CHEST 1 VIEW COMPARISON:  Chest radiograph 12/29/2016 FINDINGS: The endotracheal tube tip is 2.5 cm above the inferior margin of the carina. The central venous catheter tip remains in the lower SVC. CABG markers and median sternotomy wires are unchanged. No focal airspace consolidation or pulmonary edema. Pulmonary edema has decreased from the prior study. No pneumothorax or sizable pleural effusion. Orogastric tube tip and side port are below the diaphragm. IMPRESSION: 1. Endotracheal tube tip 2.5  cm above the inferior carina. This could be retracted by approximately 2-3 cm for optimal positioning. 2. Improved aeration of the lungs. Electronically Signed   By: Ulyses Jarred M.D.   On: 12/31/2016 03:58   Dg Chest Port 1 View  Result Date: 12/29/2016 CLINICAL DATA:  Cardiac arrest, status post CPR EXAM: PORTABLE CHEST 1 VIEW COMPARISON:  Chest radiograph 12/29/2016 FINDINGS: Endotracheal tube tip is approximately 2.8 cm above the inferior carina.  Nasogastric tube has been advanced on the side port is now just below the diaphragm. There is mild cardiomegaly and mild interstitial pulmonary edema. No large pleural effusion. IMPRESSION: 1. Endotracheal tube tip approximately 2.8 cm above the inferior margin of the carina. 2. Nasogastric tube side port just below the diaphragm, at approximately the level of the GE junction. Advancing by 5-7 cm when sure that the side port is within the stomach. 3. Mild cardiomegaly and interstitial pulmonary edema. Electronically Signed   By: Ulyses Jarred M.D.   On: 12/29/2016 18:18   Dg Chest Portable 1 View  Result Date: 12/29/2016 CLINICAL DATA:  Intubated patient EXAM: PORTABLE CHEST 1 VIEW COMPARISON:  Portable chest x-ray of Jul 13, 2016 FINDINGS: The lungs are borderline hypoinflated. The endotracheal tube tip lies approximately 3.8 cm above the carina there is no alveolar infiltrate or pleural effusion. The heart is top-normal in size. The pulmonary vascularity is normal. External pacemaker defibrillator pads are present. The patient has undergone previous CABG. The esophagogastric tube tip in proximal port lie in the distal esophagus. There is mild gaseous distention of the stomach. The bony thorax exhibits no acute abnormality. IMPRESSION: Advancement of the nasogastric tube by approximately 20 cm is needed to assure the proximal port and the tip lie below the GE junction. The stomach is mildly distended with gas. The endotracheal tube tip is in reasonable position. Mild cardiomegaly.  No pulmonary vascular congestion or pneumonia. Electronically Signed   By: David  Martinique M.D.   On: 12/29/2016 12:36   Dg Foot Complete Left  Result Date: 01/01/2017 CLINICAL DATA:  76 year old female with left heel pain. EXAM: LEFT FOOT - COMPLETE 3+ VIEW COMPARISON:  02/11/2012 FINDINGS: There is no evidence of fracture or dislocation. Note is made of diffuse osteopenia. There is no evidence of arthropathy or other focal bone  abnormality. Soft tissues are unremarkable. IMPRESSION: No acute osseous abnormality. Electronically Signed   By: Kristopher Oppenheim M.D.   On: 01/01/2017 10:47   Dg Swallowing Func-speech Pathology  Result Date: 01/02/2017 Objective Swallowing Evaluation: Type of Study: MBS-Modified Barium Swallow Study  Patient Details Name: [name] [name] MRN: [id number] Date of Birth: Feb 28, 1940 Today's Date: 01/02/2017 Time: SLP Start Time (ACUTE ONLY): 1300 -SLP Stop Time (ACUTE ONLY): 1327 SLP Time Calculation (min) (ACUTE ONLY): 27 min Past Medical History: Past Medical History: Diagnosis Date . Angina  . CHF (congestive heart failure) ([place])  . Complication of anesthesia   lung  . Diabetes mellitus ([place])  . GERD (gastroesophageal reflux disease)  . H/O hiatal hernia  . Hypercholesteremia  . Hypertension  . Myocardial infarction [facility])   CABG 2001, ? stent. Cath 02/2011 with new distal LIMA-LAD 80%. SVG-OM occlusion is old, rest of grafts patent . Peripheral vascular disease ([place])  . Stroke [facility]) 2002 and 2003  Resultant right hemiparesis and aphasia. Communicates by writing  Past Surgical History: Past Surgical History: Procedure Laterality Date . ABDOMINAL HYSTERECTOMY   . BREAST BIOPSY   . BREAST CYST EXCISION Left   years ago per  daughter . CARDIAC CATHETERIZATION  02/2011 . CORONARY ARTERY BYPASS GRAFT   HPI: 76 y.o.female with known history of type 2 diabetes mellitus, CABG, HTN, stroke with residual right-sided deficits and aphasia, &coronary artery disease. Patient also has recent diagnosis of breast cancer. Patient become unresponsive the date of admission while traveling with her grandson. Upon EMS arrival patient was in ventricular fibrillation. She underwent cardioversion with one shock; pt had cardiac arrest x2. Given the patient's multiple medical comorbidities the decision was made to continue with medical care. Pt intubated 11/5-11/7. Family reports coughing with eating at home; previous SLP notes show severe oral  dysphagia at baseline with mild pharyngeal phase deficits.   No Data Recorded Assessment / Plan / Recommendation CHL IP CLINICAL IMPRESSIONS 01/02/2017 Clinical Impression Pt demonstrates a severe oral dysphagia, possibly further impaired from pts baseline, though daughters present also report over the last month Ms. Giambra has been slower with PO and more easily fatigued. There is severe lingual and labial weakenss with no ability to form or hold a blus, relying on lingual thrusting and gravity to propel portions of bolus to the pharynx. More than 50% spills anteriorally, the rest spills to the pyriforms with prolonged pooling prior to swallow initiation. Pt will often try to take further sips before initiating swallow, though verbal cues elicited swallow trigger when needed. Oropharyngeal musculature relatively strong, but when pt swallows with thin liquids in the pyriforms, pressure pushes moderate amount of thin liquids through the posterior portion of the vocal folds, indicating decreased closure at that point. Nectar thick liquids do not enter the airway. Overall, suspect pt has had a gradual decline in oral function, exacerbated by acute illness. Airway protection is now also acutley compromised following 3 day intubation, likely there is some level of inflammation or injury to the arytenoids/vocal folds post intubation causing decreased airway closure and infiltration of liquids at that site. Expect improvement in the short term. Pt may initiate a puree and nectar thick diet with decreased, but not absent risk of aspiration. Discussed potential risk with daughters who verbalize awanress of risk, present even before this admission, and agree to diet initiation. Also discussed plan with RN. PO intake will be laborious and messy. SLP will follow for tolerance.  SLP Visit Diagnosis Dysphagia, oropharyngeal phase (R13.12);Dysphagia, oral phase (R13.11) Attention and concentration deficit following -- Frontal lobe  and executive function deficit following -- Impact on safety and function Risk for inadequate nutrition/hydration;Moderate aspiration risk   CHL IP TREATMENT RECOMMENDATION 01/02/2017 Treatment Recommendations Therapy as outlined in treatment plan below   Prognosis 01/02/2017 Prognosis for Safe Diet Advancement -- Barriers to Reach Goals Severity of deficits Barriers/Prognosis Comment -- CHL IP DIET RECOMMENDATION 01/02/2017 SLP Diet Recommendations Dysphagia 1 (Puree) solids;Nectar thick liquid Liquid Administration via Cup Medication Administration Crushed with puree Compensations Slow rate;Small sips/bites Postural Changes Remain semi-upright after after feeds/meals (Comment);Seated upright at 90 degrees   CHL IP OTHER RECOMMENDATIONS 01/02/2017 Recommended Consults -- Oral Care Recommendations Oral care before and after PO Other Recommendations Have oral suction available;Order thickener from pharmacy   CHL IP FOLLOW UP RECOMMENDATIONS 01/02/2017 Follow up Recommendations Home health SLP   CHL IP FREQUENCY AND DURATION 01/02/2017 Speech Therapy Frequency (ACUTE ONLY) min 2x/week Treatment Duration 2 weeks      CHL IP ORAL PHASE 01/02/2017 Oral Phase Impaired Oral - Pudding Teaspoon -- Oral - Pudding Cup -- Oral - Honey Teaspoon -- Oral - Honey Cup -- Oral - Nectar Teaspoon Left anterior bolus loss;Right  anterior bolus loss;Lingual pumping;Reduced posterior propulsion;Lingual/palatal residue;Delayed oral transit;Decreased bolus cohesion;Premature spillage Oral - Nectar Cup Left anterior bolus loss;Right anterior bolus loss;Lingual pumping;Reduced posterior propulsion;Lingual/palatal residue;Delayed oral transit;Decreased bolus cohesion;Premature spillage Oral - Nectar Straw -- Oral - Thin Teaspoon Left anterior bolus loss;Right anterior bolus loss;Lingual pumping;Reduced posterior propulsion;Lingual/palatal residue;Delayed oral transit;Decreased bolus cohesion;Premature spillage Oral - Thin Cup Left anterior bolus  loss;Right anterior bolus loss;Lingual pumping;Reduced posterior propulsion;Lingual/palatal residue;Delayed oral transit;Decreased bolus cohesion;Premature spillage Oral - Thin Straw -- Oral - Puree Left anterior bolus loss;Right anterior bolus loss;Lingual pumping;Reduced posterior propulsion;Delayed oral transit;Decreased bolus cohesion;Weak lingual manipulation Oral - Mech Soft -- Oral - Regular -- Oral - Multi-Consistency -- Oral - Pill -- Oral Phase - Comment --  CHL IP PHARYNGEAL PHASE 01/02/2017 Pharyngeal Phase Impaired Pharyngeal- Pudding Teaspoon -- Pharyngeal -- Pharyngeal- Pudding Cup -- Pharyngeal -- Pharyngeal- Honey Teaspoon -- Pharyngeal -- Pharyngeal- Honey Cup -- Pharyngeal -- Pharyngeal- Nectar Teaspoon Delayed swallow initiation-pyriform sinuses Pharyngeal -- Pharyngeal- Nectar Cup Delayed swallow initiation-pyriform sinuses;Penetration/Aspiration during swallow Pharyngeal -- Pharyngeal- Nectar Straw -- Pharyngeal -- Pharyngeal- Thin Teaspoon Delayed swallow initiation-pyriform sinuses;Penetration/Aspiration during swallow;Reduced airway/laryngeal closure Pharyngeal Material enters airway, passes BELOW cords and not ejected out despite cough attempt by patient Pharyngeal- Thin Cup Penetration/Aspiration during swallow;Reduced airway/laryngeal closure;Delayed swallow initiation-pyriform sinuses Pharyngeal Material enters airway, passes BELOW cords and not ejected out despite cough attempt by patient Pharyngeal- Thin Straw -- Pharyngeal -- Pharyngeal- Puree -- Pharyngeal -- Pharyngeal- Mechanical Soft -- Pharyngeal -- Pharyngeal- Regular -- Pharyngeal -- Pharyngeal- Multi-consistency -- Pharyngeal -- Pharyngeal- Pill -- Pharyngeal -- Pharyngeal Comment --  CHL IP CERVICAL ESOPHAGEAL PHASE 12/03/2015 Cervical Esophageal Phase WFL Pudding Teaspoon -- Pudding Cup -- Honey Teaspoon -- Honey Cup -- Nectar Teaspoon -- Nectar Cup -- Nectar Straw -- Thin Teaspoon -- Thin Cup -- Thin Straw -- Puree --  Mechanical Soft -- Regular -- Multi-consistency -- Pill -- Cervical Esophageal Comment -- CHL IP GO 05/05/2012 Functional Assessment Tool Used clinical judgement Functional Limitations Swallowing Swallow Current Status (K2409) CM Swallow Goal Status (B3532) CL Swallow Discharge Status (D9242) (None) Motor Speech Current Status (A8341) (None) Motor Speech Goal Status (D6222) (None) Motor Speech Goal Status (L7989) (None) Spoken Language Comprehension Current Status (Q1194) (None) Spoken Language Comprehension Goal Status (R7408) (None) Spoken Language Comprehension Discharge Status (X4481) (None) Spoken Language Expression Current Status (E5631) (None) Spoken Language Expression Goal Status (S9702) (None) Spoken Language Expression Discharge Status (O3785) (None) Attention Current Status (Y8502) (None) Attention Goal Status (D7412) (None) Attention Discharge Status (I7867) (None) Memory Current Status (E7209) (None) Memory Goal Status (O7096) (None) Memory Discharge Status (G8366) (None) Voice Current Status (Q9476) (None) Voice Goal Status (L4650) (None) Voice Discharge Status (P5465) (None) Other Speech-Language Pathology Functional Limitation Current Status (K8127) (None) Other Speech-Language Pathology Functional Limitation Goal Status (N1700) (None) Other Speech-Language Pathology Functional Limitation Discharge Status [phone]) (None) Herbie Baltimore, MA CCC-SLP [phone] [place], Katherene Ponto 01/02/2017, 2:22 PM              US Abdomen Limited Ruq  Result Date: 12/30/2016 CLINICAL DATA:  Unspecified disorder of liver function, transaminitis. EXAM: ULTRASOUND ABDOMEN LIMITED RIGHT UPPER QUADRANT COMPARISON:  08/15/2008 CT FINDINGS: Gallbladder: No gallstones or wall thickening visualized. Biliary sludge is noted along the dependent wall. No sonographic Murphy sign noted by sonographer. Common bile duct: Diameter: 8.1 mm in caliber, within normal limits for age. No choledocholithiasis. Liver: No focal lesion  identified. Mild intrahepatic ductal dilatation. Within normal limits in parenchymal echogenicity. Portal vein is patent on  color Doppler imaging with normal direction of blood flow towards the liver. Other: Mild increase in echogenicity of the adjacent right kidney, nonspecific but may reflect medical renal disease. IMPRESSION: 1. Layering biliary sludge without secondary signs of acute cholecystitis. 2. Mild ectasia of the intra and extrahepatic biliary system, likely within normal limits for age. 3. Slight increase in right renal cortical echogenicity that may reflect medical renal disease. No obstructive uropathy. Electronically Signed   By: Ashley Royalty M.D.   On: 12/30/2016 16:58       Subjective: Patient feeling well, no chest pain or dyspnea, no nausea or vomiting, still not able to speak.   Discharge Exam: Vitals:   01/06/17 0445 01/06/17 0800  BP: (!) 107/58 124/76  Pulse: 63 79  Resp: 20 20  Temp: 98.2 F (36.8 C) 98.4 F (36.9 C)  SpO2: 92% 92%   Vitals:   01/05/17 1334 01/05/17 2018 01/06/17 0445 01/06/17 0800  BP: 132/82 129/77 (!) 107/58 124/76  Pulse: 81 82 63 79  Resp: (!) 33 20 20 20   Temp: 98.2 F (36.8 C) 98.6 F (37 C) 98.2 F (36.8 C) 98.4 F (36.9 C)  TempSrc: Oral Oral Axillary   SpO2: 99% 99% 92% 92%  Weight:   73.9 kg (162 lb 14.7 oz)   Height:        General: Pt is alert, awake, not in acute distress E ENT. No pallor or icterus, oral mucosa moist.  Cardiovascular: RRR, S1/S2 +, no rubs, no gallops Respiratory: CTA bilaterally, no wheezing, no rhonchi Abdominal: Soft, NT, ND, bowel sounds + Extremities: no edema, no cyanosis    The results of significant diagnostics from this hospitalization (including imaging, microbiology, ancillary and laboratory) are listed below for reference.     Microbiology: Recent Results (from the past 240 hour(s))  MRSA PCR Screening     Status: None   Collection Time: 12/29/16  3:24 PM  Result Value Ref Range  Status   MRSA by PCR NEGATIVE NEGATIVE Final    Comment:        The GeneXpert MRSA Assay (FDA approved for NASAL specimens only), is one component of a comprehensive MRSA colonization surveillance program. It is not intended to diagnose MRSA infection nor to guide or monitor treatment for MRSA infections. Performed at [facility], [place] [address], [place], [place] [address]      Labs: BNP (last 3 results) Recent Labs    07/12/16 1206 07/13/16 0340  BNP 810.5* 062.6*   Basic Metabolic Panel: Recent Labs  Lab 12/30/16 1447 12/31/16 0231 01/01/17 0459 01/01/17 0929 01/01/17 1742 01/02/17 0442 01/03/17 1111 01/03/17 1645  NA  --  136 138  --  139 140 138  --   K  --  3.6 3.4*  --  4.1 4.3 3.6  --   CL  --  106 105  --  108 107 106  --   CO2  --  24 26  --  26 23 26   --   GLUCOSE  --  190* 201*  --  167* 187* 258* 336*  BUN  --  12 9  --  9 11 11   --   CREATININE  --  0.92 0.91  --  0.84 0.93 0.91  --   CALCIUM  --  8.8* 8.9  --  9.0 9.2 9.0  --   MG 1.7 1.9  --  1.5*  --  2.1 1.5*  --   PHOS  --  2.5  --   --   --  1.9*  --   --    Liver Function Tests: Recent Labs  Lab 12/31/16 0231 01/02/17 0442  AST 46*  --   ALT 78*  --   ALKPHOS 57  --   BILITOT 0.9  --   PROT 5.9*  --   ALBUMIN 2.9* 3.1*   No results for input(s): LIPASE, AMYLASE in the last 168 hours. No results for input(s): AMMONIA in the last 168 hours. CBC: Recent Labs  Lab 12/31/16 0231 01/01/17 0459 01/02/17 0442  WBC 9.7 6.3 6.8  NEUTROABS  --   --  4.7  HGB 11.0* 9.4* 10.0*  HCT 34.7* 31.4* 32.6*  MCV 85.0 87.2 86.2  PLT 123* 102* 126*   Cardiac Enzymes: No results for input(s): CKTOTAL, CKMB, CKMBINDEX, TROPONINI in the last 168 hours. BNP: Invalid input(s): POCBNP CBG: Recent Labs  Lab 01/05/17 1123 01/05/17 2013 01/05/17 2359 01/06/17 0441 01/06/17 0757  GLUCAP 193* 199* 170* 178* 154*   D-Dimer No results for input(s): DDIMER in the last 72  hours. Hgb A1c No results for input(s): HGBA1C in the last 72 hours. Lipid Profile No results for input(s): CHOL, HDL, LDLCALC, TRIG, CHOLHDL, LDLDIRECT in the last 72 hours. Thyroid function studies No results for input(s): TSH, T4TOTAL, T3FREE, THYROIDAB in the last 72 hours.  Invalid input(s): FREET3 Anemia work up No results for input(s): VITAMINB12, FOLATE, FERRITIN, TIBC, IRON, RETICCTPCT in the last 72 hours. Urinalysis    Component Value Date/Time   COLORURINE YELLOW 06/07/2016 2025   APPEARANCEUR CLEAR 06/07/2016 2025   LABSPEC 1.023 06/07/2016 2025   PHURINE 5.0 06/07/2016 2025   GLUCOSEU 150 (A) 06/07/2016 2025   HGBUR NEGATIVE 06/07/2016 2025   BILIRUBINUR NEGATIVE 06/07/2016 2025   KETONESUR 5 (A) 06/07/2016 2025   PROTEINUR NEGATIVE 06/07/2016 2025   UROBILINOGEN 1.0 07/13/2013 2255   NITRITE NEGATIVE 06/07/2016 2025   LEUKOCYTESUR NEGATIVE 06/07/2016 2025   Sepsis Labs Invalid input(s): PROCALCITONIN,  WBC,  LACTICIDVEN Microbiology Recent Results (from the past 240 hour(s))  MRSA PCR Screening     Status: None   Collection Time: 12/29/16  3:24 PM  Result Value Ref Range Status   MRSA by PCR NEGATIVE NEGATIVE Final    Comment:        The GeneXpert MRSA Assay (FDA approved for NASAL specimens only), is one component of a comprehensive MRSA colonization surveillance program. It is not intended to diagnose MRSA infection nor to guide or monitor treatment for MRSA infections. Performed at [facility], [place] [address], [place], [place] [address]      Time coordinating discharge: 45 minutes  SIGNED:   Tawni Millers, MD  [facility] 01/06/2017, 11:45 AM Pager [phone]  If 7PM-7AM, please contact night-coverage [email or web address] Password [email or web address]

## 2017-01-06 NOTE — Progress Notes (Signed)
Pt discharging to [facility]. IV removed and discussed discharge paperwork. Pt transporting with [name] [name].

## 2017-01-11 ENCOUNTER — Other Ambulatory Visit: Payer: Self-pay

## 2017-01-11 ENCOUNTER — Inpatient Hospital Stay (HOSPITAL_COMMUNITY)
Admission: EM | Admit: 2017-01-11 | Discharge: 2017-01-17 | DRG: 637 | Disposition: A | Discharge status: Skilled Nursing Facility | Payer: Medicare HMO | Attending: Internal Medicine | Admitting: Internal Medicine

## 2017-01-11 ENCOUNTER — Encounter (HOSPITAL_COMMUNITY): Payer: Self-pay | Admitting: Emergency Medicine

## 2017-01-11 DIAGNOSIS — R05 Cough: Secondary | ICD-10-CM

## 2017-01-11 DIAGNOSIS — R1310 Dysphagia, unspecified: Secondary | ICD-10-CM

## 2017-01-11 DIAGNOSIS — E1151 Type 2 diabetes mellitus with diabetic peripheral angiopathy without gangrene: Secondary | ICD-10-CM | POA: Diagnosis present

## 2017-01-11 DIAGNOSIS — E7800 Pure hypercholesterolemia, unspecified: Secondary | ICD-10-CM | POA: Diagnosis present

## 2017-01-11 DIAGNOSIS — R4702 Dysphasia: Secondary | ICD-10-CM | POA: Diagnosis present

## 2017-01-11 DIAGNOSIS — I10 Essential (primary) hypertension: Secondary | ICD-10-CM | POA: Diagnosis present

## 2017-01-11 DIAGNOSIS — I5042 Chronic combined systolic (congestive) and diastolic (congestive) heart failure: Secondary | ICD-10-CM | POA: Diagnosis present

## 2017-01-11 DIAGNOSIS — I69351 Hemiplegia and hemiparesis following cerebral infarction affecting right dominant side: Secondary | ICD-10-CM

## 2017-01-11 DIAGNOSIS — L97219 Non-pressure chronic ulcer of right calf with unspecified severity: Secondary | ICD-10-CM | POA: Diagnosis present

## 2017-01-11 DIAGNOSIS — I252 Old myocardial infarction: Secondary | ICD-10-CM

## 2017-01-11 DIAGNOSIS — L8990 Pressure ulcer of unspecified site, unspecified stage: Secondary | ICD-10-CM | POA: Diagnosis present

## 2017-01-11 DIAGNOSIS — Z79899 Other long term (current) drug therapy: Secondary | ICD-10-CM

## 2017-01-11 DIAGNOSIS — R059 Cough, unspecified: Secondary | ICD-10-CM

## 2017-01-11 DIAGNOSIS — Z7902 Long term (current) use of antithrombotics/antiplatelets: Secondary | ICD-10-CM

## 2017-01-11 DIAGNOSIS — E1165 Type 2 diabetes mellitus with hyperglycemia: Secondary | ICD-10-CM | POA: Diagnosis not present

## 2017-01-11 DIAGNOSIS — Z853 Personal history of malignant neoplasm of breast: Secondary | ICD-10-CM

## 2017-01-11 DIAGNOSIS — E119 Type 2 diabetes mellitus without complications: Secondary | ICD-10-CM

## 2017-01-11 DIAGNOSIS — R739 Hyperglycemia, unspecified: Secondary | ICD-10-CM | POA: Diagnosis not present

## 2017-01-11 DIAGNOSIS — Z170 Estrogen receptor positive status [ER+]: Secondary | ICD-10-CM

## 2017-01-11 DIAGNOSIS — L89153 Pressure ulcer of sacral region, stage 3: Secondary | ICD-10-CM | POA: Diagnosis present

## 2017-01-11 DIAGNOSIS — K219 Gastro-esophageal reflux disease without esophagitis: Secondary | ICD-10-CM | POA: Diagnosis present

## 2017-01-11 DIAGNOSIS — C50511 Malignant neoplasm of lower-outer quadrant of right female breast: Secondary | ICD-10-CM

## 2017-01-11 DIAGNOSIS — I491 Atrial premature depolarization: Secondary | ICD-10-CM | POA: Diagnosis present

## 2017-01-11 DIAGNOSIS — Z880 Allergy status to penicillin: Secondary | ICD-10-CM

## 2017-01-11 DIAGNOSIS — I69320 Aphasia following cerebral infarction: Secondary | ICD-10-CM

## 2017-01-11 DIAGNOSIS — Z7982 Long term (current) use of aspirin: Secondary | ICD-10-CM

## 2017-01-11 DIAGNOSIS — Z794 Long term (current) use of insulin: Secondary | ICD-10-CM

## 2017-01-11 DIAGNOSIS — Z6833 Body mass index (BMI) 33.0-33.9, adult: Secondary | ICD-10-CM

## 2017-01-11 DIAGNOSIS — J9601 Acute respiratory failure with hypoxia: Secondary | ICD-10-CM | POA: Diagnosis present

## 2017-01-11 DIAGNOSIS — I2510 Atherosclerotic heart disease of native coronary artery without angina pectoris: Secondary | ICD-10-CM | POA: Diagnosis present

## 2017-01-11 DIAGNOSIS — R778 Other specified abnormalities of plasma proteins: Secondary | ICD-10-CM

## 2017-01-11 DIAGNOSIS — I350 Nonrheumatic aortic (valve) stenosis: Secondary | ICD-10-CM | POA: Diagnosis present

## 2017-01-11 DIAGNOSIS — R0902 Hypoxemia: Secondary | ICD-10-CM

## 2017-01-11 DIAGNOSIS — L89303 Pressure ulcer of unspecified buttock, stage 3: Secondary | ICD-10-CM | POA: Diagnosis present

## 2017-01-11 DIAGNOSIS — I110 Hypertensive heart disease with heart failure: Secondary | ICD-10-CM | POA: Diagnosis present

## 2017-01-11 DIAGNOSIS — Z951 Presence of aortocoronary bypass graft: Secondary | ICD-10-CM

## 2017-01-11 DIAGNOSIS — E11622 Type 2 diabetes mellitus with other skin ulcer: Secondary | ICD-10-CM | POA: Diagnosis present

## 2017-01-11 DIAGNOSIS — R7989 Other specified abnormal findings of blood chemistry: Secondary | ICD-10-CM

## 2017-01-11 HISTORY — DX: Ventricular fibrillation: I49.01

## 2017-01-11 HISTORY — DX: Cardiac arrest, cause unspecified: I46.9

## 2017-01-11 HISTORY — DX: Non-ST elevation (NSTEMI) myocardial infarction: I21.4

## 2017-01-11 LAB — URINALYSIS, ROUTINE W REFLEX MICROSCOPIC
BILIRUBIN URINE: NEGATIVE
Bacteria, UA: NONE SEEN
HGB URINE DIPSTICK: NEGATIVE
KETONES UR: NEGATIVE mg/dL
LEUKOCYTES UA: NEGATIVE
NITRITE: NEGATIVE
PROTEIN: NEGATIVE mg/dL
RBC / HPF: NONE SEEN RBC/hpf (ref 0–5)
Specific Gravity, Urine: 1.018 (ref 1.005–1.030)
Squamous Epithelial / LPF: NONE SEEN
pH: 7 (ref 5.0–8.0)

## 2017-01-11 LAB — CBC WITH DIFFERENTIAL/PLATELET
BASOS ABS: 0 10*3/uL (ref 0.0–0.1)
BASOS PCT: 0 %
Eosinophils Absolute: 0.1 10*3/uL (ref 0.0–0.7)
Eosinophils Relative: 3 %
HEMATOCRIT: 33.9 % — AB (ref 36.0–46.0)
HEMOGLOBIN: 10.3 g/dL — AB (ref 12.0–15.0)
LYMPHS PCT: 20 %
Lymphs Abs: 1.2 10*3/uL (ref 0.7–4.0)
MCH: 26.3 pg (ref 26.0–34.0)
MCHC: 30.4 g/dL (ref 30.0–36.0)
MCV: 86.7 fL (ref 78.0–100.0)
MONO ABS: 0.4 10*3/uL (ref 0.1–1.0)
Monocytes Relative: 7 %
NEUTROS ABS: 4 10*3/uL (ref 1.7–7.7)
NEUTROS PCT: 70 %
Platelets: 224 10*3/uL (ref 150–400)
RBC: 3.91 MIL/uL (ref 3.87–5.11)
RDW: 15.3 % (ref 11.5–15.5)
WBC: 5.7 10*3/uL (ref 4.0–10.5)

## 2017-01-11 LAB — COMPREHENSIVE METABOLIC PANEL
ALT: 11 U/L — ABNORMAL LOW (ref 14–54)
ANION GAP: 6 (ref 5–15)
AST: 16 U/L (ref 15–41)
Albumin: 3 g/dL — ABNORMAL LOW (ref 3.5–5.0)
Alkaline Phosphatase: 57 U/L (ref 38–126)
BILIRUBIN TOTAL: 0.5 mg/dL (ref 0.3–1.2)
BUN: 8 mg/dL (ref 6–20)
CHLORIDE: 99 mmol/L — AB (ref 101–111)
CO2: 31 mmol/L (ref 22–32)
Calcium: 9.3 mg/dL (ref 8.9–10.3)
Creatinine, Ser: 0.9 mg/dL (ref 0.44–1.00)
GFR calc Af Amer: >60 mL/min (ref >60)
Glucose, Bld: 376 mg/dL — ABNORMAL HIGH (ref 65–99)
POTASSIUM: 4.7 mmol/L (ref 3.5–5.1)
Sodium: 136 mmol/L (ref 135–145)
TOTAL PROTEIN: 6.4 g/dL — AB (ref 6.5–8.1)

## 2017-01-11 LAB — CBG MONITORING, ED: GLUCOSE-CAPILLARY: 188 mg/dL — AB (ref 65–99)

## 2017-01-11 LAB — TROPONIN I: TROPONIN I: 0.24 ng/mL — AB (ref <0.03)

## 2017-01-11 MED ORDER — SODIUM CHLORIDE 0.9 % IV SOLN
INTRAVENOUS | Status: DC
  Administered 2017-01-11: 22:00:00 via INTRAVENOUS

## 2017-01-11 MED ORDER — INSULIN ASPART 100 UNIT/ML ~~LOC~~ SOLN
10.0000 [IU] | Freq: Once | SUBCUTANEOUS | Status: AC
  Administered 2017-01-11: 10 [IU] via INTRAVENOUS
  Filled 2017-01-11: qty 1

## 2017-01-11 NOTE — ED Notes (Signed)
Date and time results received: 01/11/17 2217   Test: Troponin Critical Value: 0.24  Name of Provider Notified: Dr. Sabra Heck

## 2017-01-11 NOTE — ED Notes (Signed)
IV Team bedside 

## 2017-01-11 NOTE — ED Triage Notes (Signed)
Pt arrives via EMS from [facility] where patient was transported to ED for high blood sugars. Per EMS last CBG 529. Pt only on sliding scale novolog for glycemic control no long acting insulins. Pt nonverbal at baseline but nods appropriately. VSS. Denies pain. Pt full code.

## 2017-01-11 NOTE — ED Provider Notes (Signed)
[facility] EMERGENCY DEPARTMENT Provider Note   CSN: [id number] Arrival date & time: 01/11/17  1907     History   Chief Complaint Chief Complaint  Patient presents with  . Hyperglycemia    HPI [name] [name] is a 76 y.o. female.  HPI  The patient is a 76 year old female who has a known history of congestive heart failure, diabetes, myocardial infarction status post stenting years ago as well as a history of a cardiac arrest that occurred approximately 2 weeks ago.  She was resuscitated in the hospital in the intensive care unit and was discharged on November 13 after 8 days in the hospital.  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  She was in her usual state of health at her nursing facility when she was found to have a high blood sugar.  It was recommended that she come to the hospital for evaluation though the patient states that she does not feel bad.  She cannot talk but she nods or shakes her head yes or no in response to my questions.  The only complaint that she has is a mild headache but denies chest pain shortness of breath abdominal pain dysuria diarrhea fevers vomiting or coughing.  There is a level 5 caveat that applies secondary to the patient's inability to communicate.  Past Medical History:  Diagnosis Date  . Angina   . CHF (congestive heart failure) ([place])   . Complication of anesthesia    lung   . Diabetes mellitus ([place])   . GERD (gastroesophageal reflux disease)   . H/O hiatal hernia   . Hypercholesteremia   . Hypertension   . Myocardial infarction [facility])    CABG 2001, ? stent. Cath 02/2011 with new distal LIMA-LAD 80%. SVG-OM occlusion is old, rest of grafts patent  . Peripheral vascular disease ([place])   . Stroke [facility]) 2002 and 2003   Resultant right hemiparesis and aphasia. Communicates by writing     Patient Active Problem List   Diagnosis Date Noted  .  Pressure injury of skin 01/04/2017  . Goals of care, counseling/discussion   . Palliative care by specialist   . Cardiac arrest with ventricular fibrillation ([place])   . Respiratory failure ([place])   . Cardiac arrest ([place]) 12/29/2016  . Malnutrition of moderate degree 07/15/2016  . Diastolic CHF ([place]) 10/93/2355  . Ankle ulcer, right, limited to breakdown of skin ([place]) 07/12/2016  . Malignant neoplasm of lower-outer quadrant of right breast of female, estrogen receptor positive ([place]) 05/28/2016  . Chronic combined systolic and diastolic CHF (congestive heart failure) ([place]) 12/01/2015  . Acute encephalopathy 12/01/2015  . Hypoglycemia 12/01/2015  . Elevated troponin 12/01/2015  . Diabetes mellitus without complication ([place])   . Hypothermia   . Rhonchi   . Acute respiratory failure ([place]) 01/28/2014  . Respiratory distress 01/27/2014  . Hypoxia 01/27/2014  . CHF exacerbation ([place]) 01/27/2014  . Acute on chronic systolic CHF (congestive heart failure) ([place]) 01/27/2014  . Systolic CHF, acute on chronic ([place]) 01/27/2014  . Increased anion gap metabolic acidosis 73/22/0254  . Right foot ulcer ([place]) 01/27/2014  . Aspiration pneumonia ([place]) 06/23/2012  . Dysphagia 06/21/2012  . Palliative care encounter 06/20/2012  . Healthcare-associated pneumonia 06/16/2012  . TIA (transient ischemic attack) 05/05/2012  . N&V (nausea and vomiting) 05/04/2012  . Weakness generalized 05/04/2012  . 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 NSTEMI (non-ST elevated myocardial infarction) ([place]) 07/27/2011  . Diabetes mellitus ([place])   . Chest pain 03/10/2011  . HTN (hypertension) 03/10/2011  . Cerebral infarction ([place]) 03/10/2011  . CAD (coronary artery disease), native coronary artery 03/10/2011  . 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 S/P PTCA (percutaneous transluminal coronary angioplasty) 03/10/2011    Past Surgical History:  Procedure Laterality  Date  . ABDOMINAL HYSTERECTOMY    . BREAST BIOPSY    . BREAST CYST EXCISION Left    years ago per daughter  . CARDIAC CATHETERIZATION  02/2011  . CORONARY ARTERY BYPASS GRAFT    . ESOPHAGOGASTRODUODENOSCOPY (EGD) N/A 03/12/2011   Performed by [name], MD at [facility]  . LEFT HEART CATHETERIZATION WITH CORONARY ANGIOGRAM N/A 03/10/2011   Performed by [name], MD at [facility] CATH LAB    OB History    No data available       Home Medications    Prior to Admission medications   Medication Sig Start Date End Date Taking? Authorizing Provider  anastrozole (ARIMIDEX) 1 MG tablet Take 1 tablet (1 mg total) by mouth daily. 05/28/16  Yes Magrinat, Virgie Dad, MD  aspirin 81 MG chewable tablet Chew 1 tablet (81 mg total) daily by mouth. 01/07/17  Yes Arrien, Jimmy Picket, MD  atorvastatin (LIPITOR) 40 MG tablet Take 1 tablet (40 mg total) daily at 6 PM by mouth. 01/06/17  Yes Arrien, Jimmy Picket, MD  bisacodyl (DULCOLAX) 10 MG suppository Place 10 mg as needed rectally for moderate constipation.   Yes [provider]  carvedilol (COREG) 3.125 MG tablet Take 1 tablet (3.125 mg total) 2 (two) times daily with a meal by mouth. 01/06/17 02/05/17 Yes Arrien, Jimmy Picket, MD  cholecalciferol (VITAMIN D) 1000 units tablet Take 1,000 Units by mouth daily.   Yes [provider]  clopidogrel (PLAVIX) 75 MG tablet Take 75 mg by mouth daily.     Yes [provider]  escitalopram (LEXAPRO) 20 MG tablet Take 20 mg by mouth daily.   Yes [provider]  furosemide (LASIX) 40 MG tablet Take 1 tablet (40 mg total) daily by mouth. 01/06/17  Yes Arrien, Jimmy Picket, MD  gabapentin (NEURONTIN) 100 MG capsule Take 1 capsule (100 mg total) by mouth 3 (three) times daily. 01/28/14  Yes Rai, Ripudeep K, MD  insulin aspart (NOVOLOG) 100 UNIT/ML injection Inject 0-9 Units 3 (three) times daily with meals into the skin. For glucose 150 to 200 use 2 units, for  glucose 201 to 250 use 4 units, for 251 to 300 use 6 units, for 301 to 350 use 8 units, for 351 or greater use 10 units. 01/06/17  Yes Arrien, Jimmy Picket, MD  lisinopril (PRINIVIL,ZESTRIL) 10 MG tablet Take 1 tablet (10 mg total) daily by mouth. 01/07/17  Yes Arrien, Jimmy Picket, MD  magnesium hydroxide (MILK OF MAGNESIA) 400 MG/5ML suspension Take 30 mLs daily as needed by mouth for mild constipation.   Yes [provider]  mouth rinse LIQD solution 15 mLs 2 (two) times daily for 15 days by Mouth Rinse route. 01/06/17 01/21/17 Yes Arrien, Jimmy Picket, MD  nitroGLYCERIN (NITROSTAT) 0.4 MG SL tablet Place 0.4 mg under the tongue every 5 (five) minutes as needed for chest pain.   Yes [provider]  spironolactone (ALDACTONE) 25 MG tablet Take 0.5 tablets (12.5 mg total) daily by mouth. 01/07/17 02/06/17 Yes Arrien, Jimmy Picket, MD  feeding supplement, [place], ([place]azel  SHAKE) LIQD Take 237 mLs 3 (three) times daily between meals by mouth. Patient not taking: Reported on 01/11/2017 01/06/17 02/05/17  Arrien, Jimmy Picket, MD    Family History Family History  Problem Relation Age of Onset  . Heart disease Mother   . Stroke Father   . Diabetes Sister   . Diabetes Brother   . Anesthesia problems Neg Hx   . Hypotension Neg Hx   . Malignant hyperthermia Neg Hx   . Pseudochol deficiency Neg Hx     Social History Social History   Tobacco Use  . Smoking status: Never Smoker  . Smokeless tobacco: Never Used  Substance Use Topics  . Alcohol use: No  . Drug use: No     Allergies   Penicillins   Review of Systems Review of Systems  All other systems reviewed and are negative.    Physical Exam Updated Vital Signs BP (!) 149/60   Pulse (!) 59   Temp 98.7 F (37.1 C) (Oral)   Resp 18   SpO2 97%   Physical Exam  Constitutional: She appears well-developed and well-nourished. No distress.  HENT:  Head: Normocephalic and atraumatic.    Mouth/Throat: Oropharynx is clear and moist. No oropharyngeal exudate.  Eyes: Conjunctivae and EOM are normal. Pupils are equal, round, and reactive to light. Right eye exhibits no discharge. Left eye exhibits no discharge. No scleral icterus.  Neck: Normal range of motion. Neck supple. No JVD present. No thyromegaly present.  Cardiovascular: Normal rate, regular rhythm, normal heart sounds and intact distal pulses. Exam reveals no gallop and no friction rub.  No murmur heard. Pulmonary/Chest: Effort normal and breath sounds normal. No respiratory distress. She has no wheezes. She has no rales.  Abdominal: Soft. Bowel sounds are normal. She exhibits no distension and no mass. There is no tenderness.  Musculoskeletal: Normal range of motion. She exhibits no edema or tenderness.  Lymphadenopathy:    She has no cervical adenopathy.  Neurological: She is alert. Coordination normal.  The patient is nonverbal but is able to understand my questions and shakes yes or no with her head in response.  She has an essentially flaccid right upper extremity, weak right lower extremity but normal strength in the left side.  She has slight right-sided facial droop.  This is baseline for her neurologic exam  Skin: Skin is warm and dry. No rash noted. No erythema.  Psychiatric: She has a normal mood and affect. Her behavior is normal.  Nursing note and vitals reviewed.    ED Treatments / Results  Labs (all labs ordered are listed, but only abnormal results are displayed) Labs Reviewed  CBC WITH DIFFERENTIAL/PLATELET - Abnormal; Notable for the following components:      Result Value   Hemoglobin 10.3 (*)    HCT 33.9 (*)    All other components within normal limits  TROPONIN I - Abnormal; Notable for the following components:   Troponin I 0.24 (*)    All other components within normal limits  COMPREHENSIVE METABOLIC PANEL - Abnormal; Notable for the following components:   Chloride 99 (*)    Glucose,  Bld 376 (*)    Total Protein 6.4 (*)    Albumin 3.0 (*)    ALT 11 (*)    All other components within normal limits  URINALYSIS, ROUTINE W REFLEX MICROSCOPIC - Abnormal; Notable for the following components:   Color, Urine STRAW (*)    Glucose, UA >=500 (*)    All other  components within normal limits  CBG MONITORING, ED - Abnormal; Notable for the following components:   Glucose-Capillary 188 (*)    All other components within normal limits  TROPONIN I    EKG  EKG Interpretation None       Radiology No results found.  Procedures Procedures (including critical care time)  Medications Ordered in ED Medications  0.9 %  sodium chloride infusion ( Intravenous New Bag/Given 01/11/17 2200)  insulin aspart (novoLOG) injection 10 Units (10 Units Intravenous Given 01/11/17 2200)     Initial Impression / Assessment and Plan / ED Course  I have reviewed the triage vital signs and the nursing notes.  Pertinent labs & imaging results that were available during my care of the patient were reviewed by me and considered in my medical decision making (see chart for details).    The cause of the patient's hyperglycemia is unclear, it appears that she is on a sliding scale insulin but does not take any long-term daily sustained release insulin.  She does not appear ill, she had a recent event which was severe and required intubation and intensive care unit care thus we will evaluate her with labs, some IV fluids, try to reduce her blood sugar and make sure she is not in diabetic ketoacidosis.  Labs are reassuring as far as hyperglycemia as it has improved after dosing the insulin, metabolic panel reassuring that there is no DKA, troponin is 0.24 however it does appear that the patient had a significant elevation in the troponin when she was in the hospital within the last couple of weeks and this may be trending down.  If it is trending up she will need to be admitted, change of shift, care  signed out to oncoming emergency department provider Dr. Randal Buba  Final Clinical Impressions(s) / ED Diagnoses   Final diagnoses:  None    ED Discharge Orders    None       Noemi Chapel, MD 01/11/17 2344

## 2017-01-12 ENCOUNTER — Ambulatory Visit: Payer: Medicare HMO | Admitting: Oncology

## 2017-01-12 ENCOUNTER — Encounter (HOSPITAL_COMMUNITY): Payer: Self-pay | Admitting: Internal Medicine

## 2017-01-12 DIAGNOSIS — R739 Hyperglycemia, unspecified: Secondary | ICD-10-CM

## 2017-01-12 DIAGNOSIS — R0902 Hypoxemia: Secondary | ICD-10-CM | POA: Diagnosis not present

## 2017-01-12 DIAGNOSIS — E1151 Type 2 diabetes mellitus with diabetic peripheral angiopathy without gangrene: Secondary | ICD-10-CM | POA: Diagnosis present

## 2017-01-12 DIAGNOSIS — I2510 Atherosclerotic heart disease of native coronary artery without angina pectoris: Secondary | ICD-10-CM | POA: Diagnosis present

## 2017-01-12 DIAGNOSIS — L97219 Non-pressure chronic ulcer of right calf with unspecified severity: Secondary | ICD-10-CM | POA: Diagnosis present

## 2017-01-12 DIAGNOSIS — J9601 Acute respiratory failure with hypoxia: Secondary | ICD-10-CM | POA: Diagnosis present

## 2017-01-12 DIAGNOSIS — I5042 Chronic combined systolic (congestive) and diastolic (congestive) heart failure: Secondary | ICD-10-CM | POA: Diagnosis present

## 2017-01-12 DIAGNOSIS — I491 Atrial premature depolarization: Secondary | ICD-10-CM | POA: Diagnosis present

## 2017-01-12 DIAGNOSIS — I350 Nonrheumatic aortic (valve) stenosis: Secondary | ICD-10-CM | POA: Diagnosis present

## 2017-01-12 DIAGNOSIS — Z79899 Other long term (current) drug therapy: Secondary | ICD-10-CM | POA: Diagnosis not present

## 2017-01-12 DIAGNOSIS — R4702 Dysphasia: Secondary | ICD-10-CM | POA: Diagnosis present

## 2017-01-12 DIAGNOSIS — E1165 Type 2 diabetes mellitus with hyperglycemia: Secondary | ICD-10-CM | POA: Diagnosis present

## 2017-01-12 DIAGNOSIS — E11622 Type 2 diabetes mellitus with other skin ulcer: Secondary | ICD-10-CM | POA: Diagnosis present

## 2017-01-12 DIAGNOSIS — R748 Abnormal levels of other serum enzymes: Secondary | ICD-10-CM

## 2017-01-12 DIAGNOSIS — Z880 Allergy status to penicillin: Secondary | ICD-10-CM | POA: Diagnosis not present

## 2017-01-12 DIAGNOSIS — E7800 Pure hypercholesterolemia, unspecified: Secondary | ICD-10-CM | POA: Diagnosis present

## 2017-01-12 DIAGNOSIS — R1310 Dysphagia, unspecified: Secondary | ICD-10-CM | POA: Diagnosis not present

## 2017-01-12 DIAGNOSIS — Z853 Personal history of malignant neoplasm of breast: Secondary | ICD-10-CM | POA: Diagnosis not present

## 2017-01-12 DIAGNOSIS — L89303 Pressure ulcer of unspecified buttock, stage 3: Secondary | ICD-10-CM | POA: Diagnosis present

## 2017-01-12 DIAGNOSIS — L89153 Pressure ulcer of sacral region, stage 3: Secondary | ICD-10-CM | POA: Diagnosis present

## 2017-01-12 DIAGNOSIS — I252 Old myocardial infarction: Secondary | ICD-10-CM | POA: Diagnosis not present

## 2017-01-12 DIAGNOSIS — Z951 Presence of aortocoronary bypass graft: Secondary | ICD-10-CM | POA: Diagnosis not present

## 2017-01-12 DIAGNOSIS — I110 Hypertensive heart disease with heart failure: Secondary | ICD-10-CM | POA: Diagnosis present

## 2017-01-12 DIAGNOSIS — I69351 Hemiplegia and hemiparesis following cerebral infarction affecting right dominant side: Secondary | ICD-10-CM | POA: Diagnosis not present

## 2017-01-12 DIAGNOSIS — K219 Gastro-esophageal reflux disease without esophagitis: Secondary | ICD-10-CM | POA: Diagnosis present

## 2017-01-12 DIAGNOSIS — Z6833 Body mass index (BMI) 33.0-33.9, adult: Secondary | ICD-10-CM | POA: Diagnosis not present

## 2017-01-12 DIAGNOSIS — I69320 Aphasia following cerebral infarction: Secondary | ICD-10-CM | POA: Diagnosis not present

## 2017-01-12 DIAGNOSIS — R05 Cough: Secondary | ICD-10-CM | POA: Diagnosis not present

## 2017-01-12 LAB — CBG MONITORING, ED
GLUCOSE-CAPILLARY: 154 mg/dL — AB (ref 65–99)
GLUCOSE-CAPILLARY: 248 mg/dL — AB (ref 65–99)
GLUCOSE-CAPILLARY: 294 mg/dL — AB (ref 65–99)
GLUCOSE-CAPILLARY: 183 mg/dL — AB (ref 65–99)
Glucose-Capillary: 249 mg/dL — ABNORMAL HIGH (ref 65–99)

## 2017-01-12 LAB — TROPONIN I
TROPONIN I: 0.23 ng/mL — AB (ref <0.03)
TROPONIN I: 0.21 ng/mL — AB (ref <0.03)
Troponin I: 0.21 ng/mL (ref <0.03)
Troponin I: 0.23 ng/mL (ref <0.03)

## 2017-01-12 LAB — GLUCOSE, CAPILLARY: Glucose-Capillary: 289 mg/dL — ABNORMAL HIGH (ref 65–99)

## 2017-01-12 MED ORDER — ASPIRIN 81 MG PO CHEW
81.0000 mg | CHEWABLE_TABLET | Freq: Every day | ORAL | Status: DC
  Filled 2017-01-12: qty 1

## 2017-01-12 MED ORDER — LISINOPRIL 10 MG PO TABS
10.0000 mg | ORAL_TABLET | Freq: Every day | ORAL | Status: DC
  Filled 2017-01-12: qty 1

## 2017-01-12 MED ORDER — ANASTROZOLE 1 MG PO TABS
1.0000 mg | ORAL_TABLET | Freq: Every day | ORAL | Status: DC
  Filled 2017-01-12: qty 1

## 2017-01-12 MED ORDER — VITAMIN D 1000 UNITS PO TABS
1000.0000 [IU] | ORAL_TABLET | Freq: Every day | ORAL | Status: DC
  Filled 2017-01-12: qty 1

## 2017-01-12 MED ORDER — SODIUM CHLORIDE 0.9% FLUSH
3.0000 mL | Freq: Two times a day (BID) | INTRAVENOUS | Status: DC
  Administered 2017-01-12 – 2017-01-16 (×8): 3 mL via INTRAVENOUS

## 2017-01-12 MED ORDER — INSULIN ASPART 100 UNIT/ML ~~LOC~~ SOLN
0.0000 [IU] | Freq: Three times a day (TID) | SUBCUTANEOUS | Status: DC
Start: 2017-01-12 — End: 2017-01-12
  Administered 2017-01-12: 2 [IU] via SUBCUTANEOUS
  Filled 2017-01-12: qty 1

## 2017-01-12 MED ORDER — NITROGLYCERIN 0.4 MG SL SUBL: 0.4000 mg | SUBLINGUAL_TABLET | SUBLINGUAL | Status: DC | PRN

## 2017-01-12 MED ORDER — SODIUM CHLORIDE 0.9% FLUSH: 3.0000 mL | INTRAVENOUS | Status: DC | PRN

## 2017-01-12 MED ORDER — INSULIN ASPART 100 UNIT/ML ~~LOC~~ SOLN: 0.0000 [IU] | Freq: Every day | SUBCUTANEOUS | Status: DC

## 2017-01-12 MED ORDER — ATORVASTATIN CALCIUM 40 MG PO TABS: 40.0000 mg | ORAL_TABLET | Freq: Every day | ORAL | Status: DC

## 2017-01-12 MED ORDER — ACETAMINOPHEN 650 MG RE SUPP: 650.0000 mg | Freq: Four times a day (QID) | RECTAL | Status: DC | PRN

## 2017-01-12 MED ORDER — ASPIRIN 300 MG RE SUPP: 300.0000 mg | Freq: Every day | RECTAL | Status: DC

## 2017-01-12 MED ORDER — SPIRONOLACTONE 12.5 MG HALF TABLET
12.5000 mg | ORAL_TABLET | Freq: Every day | ORAL | Status: DC
  Filled 2017-01-12: qty 1

## 2017-01-12 MED ORDER — ESCITALOPRAM OXALATE 10 MG PO TABS
20.0000 mg | ORAL_TABLET | Freq: Every day | ORAL | Status: DC
  Filled 2017-01-12: qty 2

## 2017-01-12 MED ORDER — GABAPENTIN 100 MG PO CAPS
100.0000 mg | ORAL_CAPSULE | Freq: Three times a day (TID) | ORAL | Status: DC
  Filled 2017-01-12: qty 1

## 2017-01-12 MED ORDER — CLOPIDOGREL BISULFATE 75 MG PO TABS
75.0000 mg | ORAL_TABLET | Freq: Every day | ORAL | Status: DC
  Filled 2017-01-12: qty 1

## 2017-01-12 MED ORDER — SODIUM CHLORIDE 0.9 % IV SOLN: 250.0000 mL | INTRAVENOUS | Status: DC | PRN

## 2017-01-12 MED ORDER — FUROSEMIDE 20 MG PO TABS
40.0000 mg | ORAL_TABLET | Freq: Every day | ORAL | Status: DC
  Filled 2017-01-12: qty 2

## 2017-01-12 MED ORDER — INSULIN ASPART 100 UNIT/ML ~~LOC~~ SOLN
0.0000 [IU] | SUBCUTANEOUS | Status: DC
  Administered 2017-01-12: 5 [IU] via SUBCUTANEOUS
  Administered 2017-01-12: 9 [IU] via SUBCUTANEOUS
  Administered 2017-01-13: 2 [IU] via SUBCUTANEOUS
  Filled 2017-01-12: qty 1

## 2017-01-12 MED ORDER — ORAL CARE MOUTH RINSE
15.0000 mL | Freq: Two times a day (BID) | OROMUCOSAL | Status: DC
  Administered 2017-01-12 – 2017-01-17 (×6): 15 mL via OROMUCOSAL

## 2017-01-12 MED ORDER — METOPROLOL TARTRATE 5 MG/5ML IV SOLN: 2.5000 mg | Freq: Four times a day (QID) | INTRAVENOUS | Status: DC

## 2017-01-12 MED ORDER — ASPIRIN 300 MG RE SUPP
300.0000 mg | Freq: Every day | RECTAL | Status: DC
  Administered 2017-01-12 – 2017-01-13 (×2): 300 mg via RECTAL
  Filled 2017-01-12 (×2): qty 1

## 2017-01-12 MED ORDER — CARVEDILOL 3.125 MG PO TABS
3.1250 mg | ORAL_TABLET | Freq: Two times a day (BID) | ORAL | Status: DC
  Filled 2017-01-12 (×2): qty 1

## 2017-01-12 MED ORDER — METOPROLOL TARTRATE 5 MG/5ML IV SOLN
2.5000 mg | Freq: Four times a day (QID) | INTRAVENOUS | Status: DC
  Administered 2017-01-12 – 2017-01-13 (×4): 2.5 mg via INTRAVENOUS
  Filled 2017-01-12 (×4): qty 5

## 2017-01-12 MED ORDER — BISACODYL 10 MG RE SUPP: 10.0000 mg | RECTAL | Status: DC | PRN

## 2017-01-12 MED ORDER — ACETAMINOPHEN 325 MG PO TABS: 650.0000 mg | ORAL_TABLET | Freq: Four times a day (QID) | ORAL | Status: DC | PRN

## 2017-01-12 MED ORDER — ENOXAPARIN SODIUM 40 MG/0.4ML ~~LOC~~ SOLN
40.0000 mg | SUBCUTANEOUS | Status: DC
  Administered 2017-01-12 – 2017-01-16 (×4): 40 mg via SUBCUTANEOUS
  Filled 2017-01-12 (×5): qty 0.4

## 2017-01-12 NOTE — ED Notes (Signed)
Pureed mealtray ordered

## 2017-01-12 NOTE — ED Notes (Signed)
Message sent to pharmacy to verify medications 

## 2017-01-12 NOTE — Progress Notes (Signed)
[facility] TEAM 1 - Stepdown/ICU TEAM  [name] [name]  IOE:[id number] DOB: [birth year] DOA: 01/11/2017 PCP: [name], MD    Brief Narrative:  76 y.o. female w/ a hx of HTN, HLD, DM2, PVD, CVA w/ aphasia, CAD s/p CABG, CHF (EF 25-30%), Moderate Aortic Stenosis, and an admission on 12/29/2016 for VFib arrest w/ a NSTEMI  who presented to ED due to hyperglycemia.  Incidentally a troponin was ordered and was noted to be 0.24 though she denied cp.    Subjective: Pt is seen for a f/u visit.    Assessment & Plan:  Uncontrolled DM2 w/ neurophathy   Elevated troponin - CAD s/p CABG   Recent Vfib arrest w/ NSTEMI  Chronic CVA w/ acute hypoperfusion extension during arrest   Chronic systolic CHF   DVT prophylaxis: lovenox Code Status: FULL CODE Family Communication: no family present at time of exam  Disposition Plan: admit to tele   Consultants:  none  Procedures: none  Antimicrobials:  none  Objective: Blood pressure (!) 143/65, pulse (!) 56, temperature 98.7 F (37.1 C), temperature source Oral, resp. rate 16, SpO2 100 %.  Intake/Output Summary (Last 24 hours) at 01/12/2017 1055 Last data filed at 01/12/2017 1025 Gross per 24 hour  Intake 1719.67 ml  Output -  Net 1719.67 ml   There were no vitals filed for this visit.  Examination: Pt was seen for a f/u visit.    CBC: Recent Labs  Lab 01/11/17 2106  WBC 5.7  NEUTROABS 4.0  HGB 10.3*  HCT 33.9*  MCV 86.7  PLT 182   Basic Metabolic Panel: Recent Labs  Lab 01/11/17 2106  NA 136  K 4.7  CL 99*  CO2 31  GLUCOSE 376*  BUN 8  CREATININE 0.90  CALCIUM 9.3   GFR: Estimated Creatinine Clearance: 50.8 mL/min (by C-G formula based on SCr of 0.9 mg/dL).  Liver Function Tests: Recent Labs  Lab 01/11/17 2106  AST 16  ALT 11*  ALKPHOS 57  BILITOT 0.5  PROT 6.4*  ALBUMIN 3.0*   Cardiac Enzymes: Recent Labs  Lab 01/11/17 2106 01/12/17 0100 01/12/17 0615  TROPONINI 0.24* 0.21* 0.23*     HbA1C: Hgb A1c MFr Bld  Date/Time Value Ref Range Status  12/29/2016 12:45 PM 9.4 (H) 4.8 - 5.6 % Final    Comment:    (NOTE) Pre diabetes:          5.7%-6.4% Diabetes:              >6.4% Glycemic control for   <7.0% adults with diabetes   07/14/2016 03:48 AM 7.5 (H) 4.8 - 5.6 % Final    Comment:    (NOTE)         Pre-diabetes: 5.7 - 6.4         Diabetes: >6.4         Glycemic control for adults with diabetes: <7.0     CBG: Recent Labs  Lab 01/06/17 0757 01/06/17 1214 01/11/17 2247 01/12/17 0637 01/12/17 0831  GLUCAP 154* 217* 188* 154* 249*    Scheduled Meds: . anastrozole  1 mg Oral Daily  . aspirin  81 mg Oral Daily  . atorvastatin  40 mg Oral q1800  . carvedilol  3.125 mg Oral BID WC  . cholecalciferol  1,000 Units Oral Daily  . clopidogrel  75 mg Oral Daily  . enoxaparin (LOVENOX) injection  40 mg Subcutaneous Q24H  . escitalopram  20 mg Oral Daily  . furosemide  40 mg  Oral Daily  . gabapentin  100 mg Oral TID  . insulin aspart  0-5 Units Subcutaneous QHS  . insulin aspart  0-9 Units Subcutaneous TID WC  . lisinopril  10 mg Oral Daily  . mouth rinse  15 mL Mouth Rinse BID  . sodium chloride flush  3 mL Intravenous Q12H  . spironolactone  12.5 mg Oral Daily     LOS: 0 days   Time spent: No Charge  Cherene Altes, MD [facility] Office  [phone] Pager - Text Page per Amion as per below:  On-Call/Text Page:      [email or web address]      password [email or web address]  If 7PM-7AM, please contact night-coverage [email or web address] Password [email or web address] 01/12/2017, 10:55 AM

## 2017-01-12 NOTE — ED Notes (Signed)
Pt's O2 sat noted to be in the 80's.  2L of O2 applied via [place] w/ improvement in O2 sat to 96%.

## 2017-01-12 NOTE — H&P (Addendum)
TRH H&P   Patient Demographics:    [name] [name], is a 76 y.o. female  MRN: [id number]   DOB - [birth year]  Admit Date - 01/11/2017  Outpatient Primary MD for the patient is [name], MD  Referring MD/NP/PA:  April Palumbo  Outpatient Specialists:  ? [name] Prows  Patient coming from: [facility], SNF  Chief Complaint  Patient presents with  . Hyperglycemia      HPI:    [name] [name]  is a 76 y.o. female, w hypertension, hyperlipidemia, Dm2, Pvd CVA, CAD s/p CABG, CHF (EF 25-30%),  Moderate Aortic Stenosis, w recent admission on 12/29/2016 for VFib arrest, NSTEMI  who presents to ED due to hyperglycemia.  Somehow a troponin was ordered and was noted to be positive at 0.24 (prev 1.11 on 12/30/2016),  Pt denies cp, palp, sob beyond her baseline, n/v, diarrhea, brbpr.    In ED,  EKG not available to me, ordered another.   CXR not yet done.   ua ketones 5 Glucose 376 HCo3 31,  Albumin 3.0 Ast 16, Alt 11  Trop 0.24  MOST form sent w patient,  Attempt CPR, abx ok, ivf ok, no feeding tube.    Pt will be admitted for hyperglycemia and troponin elevation.        Review of systems:    In addition to the HPI above,   No Fever-chills, No Headache, No changes with Vision or hearing, No problems swallowing food or Liquids, No Chest pain, Cough or Shortness of Breath, No Abdominal pain, No Nausea or Vommitting, Bowel movements are regular, No Blood in stool or Urine, No dysuria, No new skin rashes or bruises, No new joints pains-aches,  No new weakness, tingling, numbness in any extremity, No recent weight gain or loss, No polyuria, polydypsia or polyphagia, No significant Mental Stressors.  A full 10 point Review of Systems was done, except as stated above, all other Review of Systems were negative.   With Past History of the following :    Past Medical  History:  Diagnosis Date  . Angina   . CHF (congestive heart failure) ([place])   . Complication of anesthesia    lung   . Diabetes mellitus ([place])   . GERD (gastroesophageal reflux disease)   . H/O hiatal hernia   . Hypercholesteremia   . Hypertension   . Myocardial infarction [facility])    CABG 2001, ? stent. Cath 02/2011 with new distal LIMA-LAD 80%. SVG-OM occlusion is old, rest of grafts patent  . Peripheral vascular disease ([place])   . Stroke [facility]) 2002 and 2003   Resultant right hemiparesis and aphasia. Communicates by writing       Past Surgical History:  Procedure Laterality Date  . ABDOMINAL HYSTERECTOMY    . BREAST BIOPSY    . BREAST CYST EXCISION Left    years ago per daughter  . CARDIAC CATHETERIZATION  02/2011  . CORONARY ARTERY BYPASS GRAFT    . ESOPHAGOGASTRODUODENOSCOPY (EGD) N/A 03/12/2011   Performed by [name], MD at [place]  . LEFT HEART CATHETERIZATION WITH CORONARY ANGIOGRAM N/A 03/10/2011   Performed by [name], MD at [facility] CATH LAB      Social History:     Social History   Tobacco Use  . Smoking status: Never Smoker  . Smokeless tobacco: Never Used  Substance Use Topics  . Alcohol use: No     Lives - at SNF ([facility])  Mobility - unclear   Family History :     Family History  Problem Relation Age of Onset  . Heart disease Mother   . Stroke Father   . Diabetes Sister   . Diabetes Brother   . Anesthesia problems Neg Hx   . Hypotension Neg Hx   . Malignant hyperthermia Neg Hx   . Pseudochol deficiency Neg Hx       Home Medications:   Prior to Admission medications   Medication Sig Start Date End Date Taking? Authorizing Provider  anastrozole (ARIMIDEX) 1 MG tablet Take 1 tablet (1 mg total) by mouth daily. 05/28/16  Yes Magrinat, Virgie Dad, MD  aspirin 81 MG chewable tablet Chew 1 tablet (81 mg total) daily by mouth. 01/07/17  Yes Arrien, Jimmy Picket, MD  atorvastatin (LIPITOR) 40 MG tablet Take 1 tablet (40 mg  total) daily at 6 PM by mouth. 01/06/17  Yes Arrien, Jimmy Picket, MD  bisacodyl (DULCOLAX) 10 MG suppository Place 10 mg as needed rectally for moderate constipation.   Yes [provider]  carvedilol (COREG) 3.125 MG tablet Take 1 tablet (3.125 mg total) 2 (two) times daily with a meal by mouth. 01/06/17 02/05/17 Yes Arrien, Jimmy Picket, MD  cholecalciferol (VITAMIN D) 1000 units tablet Take 1,000 Units by mouth daily.   Yes [provider]  clopidogrel (PLAVIX) 75 MG tablet Take 75 mg by mouth daily.     Yes [provider]  escitalopram (LEXAPRO) 20 MG tablet Take 20 mg by mouth daily.   Yes [provider]  furosemide (LASIX) 40 MG tablet Take 1 tablet (40 mg total) daily by mouth. 01/06/17  Yes Arrien, Jimmy Picket, MD  gabapentin (NEURONTIN) 100 MG capsule Take 1 capsule (100 mg total) by mouth 3 (three) times daily. 01/28/14  Yes Rai, Ripudeep K, MD  insulin aspart (NOVOLOG) 100 UNIT/ML injection Inject 0-9 Units 3 (three) times daily with meals into the skin. For glucose 150 to 200 use 2 units, for glucose 201 to 250 use 4 units, for 251 to 300 use 6 units, for 301 to 350 use 8 units, for 351 or greater use 10 units. 01/06/17  Yes Arrien, Jimmy Picket, MD  lisinopril (PRINIVIL,ZESTRIL) 10 MG tablet Take 1 tablet (10 mg total) daily by mouth. 01/07/17  Yes Arrien, Jimmy Picket, MD  magnesium hydroxide (MILK OF MAGNESIA) 400 MG/5ML suspension Take 30 mLs daily as needed by mouth for mild constipation.   Yes [provider]  mouth rinse LIQD solution 15 mLs 2 (two) times daily for 15 days by Mouth Rinse route. 01/06/17 01/21/17 Yes Arrien, Jimmy Picket, MD  nitroGLYCERIN (NITROSTAT) 0.4 MG SL tablet Place 0.4 mg under the tongue every 5 (five) minutes as needed for chest pain.   Yes [provider]  spironolactone (ALDACTONE) 25 MG tablet Take 0.5 tablets (12.5 mg total) daily by mouth. 01/07/17 02/06/17 Yes Arrien, Jimmy Picket, MD  feeding  supplement, GLUCERNA SHAKE, (GLUCERNA SHAKE) LIQD Take 237 mLs 3 (three) times daily between meals by mouth. Patient not taking: Reported on 01/11/2017 01/06/17 02/05/17  Arrien, Jimmy Picket, MD     Allergies:     Allergies  Allergen Reactions  . Penicillins Swelling    Swelling of mouth Has patient had a PCN reaction causing immediate rash, facial/tongue/throat swelling, SOB or lightheadedness with hypotension: YES Has patient had a PCN reaction causing severe rash involving mucus membranes or skin necrosis: NO Has patient had a PCN reaction that required hospitalization NO Has patient had a PCN reaction occurring within the last 10 years: NO If all of the above answers are "NO", then may proceed with Cephalosporin use.     Physical Exam:   Vitals  Blood pressure (!) 135/59, pulse (!) 59, temperature 98.7 F (37.1 C), temperature source Oral, resp. rate 18, SpO2 99 %.   1. General  lying in bed in NAD,    2. Normal affect and insight, Not Suicidal or Homicidal, Awake Alert, Oriented X 3.  3. No F.N deficits, ALL C.Nerves Intact, , pt has difficulty speaking, dysarthria, can't communicate  4. Ears and Eyes appear Normal, Conjunctivae clear, PERRLA. Moist Oral Mucosa.  5. Supple Neck, No JVD, No cervical lymphadenopathy appriciated, No Carotid Bruits.  6. Symmetrical Chest wall movement, Good air movement bilaterally, CTAB.  7. RRR, No Gallops, Rubs or Murmurs, No Parasternal Heave.  8. Positive Bowel Sounds, Abdomen Soft, No tenderness, No organomegaly appriciated,No rebound -guarding or rigidity.  9.  No Cyanosis, Normal Skin Turgor, No Skin Rash or Bruise.  10. Good muscle tone,  joints appear normal , no effusions, Normal ROM.  11. No Palpable Lymph Nodes in Neck or Axillae     Data Review:    CBC Recent Labs  Lab 01/11/17 2106  WBC 5.7  HGB 10.3*  HCT 33.9*  PLT 224  MCV 86.7  MCH 26.3  MCHC 30.4  RDW 15.3  LYMPHSABS 1.2    MONOABS 0.4  EOSABS 0.1  BASOSABS 0.0   ------------------------------------------------------------------------------------------------------------------  Chemistries  Recent Labs  Lab 01/11/17 2106  NA 136  K 4.7  CL 99*  CO2 31  GLUCOSE 376*  BUN 8  CREATININE 0.90  CALCIUM 9.3  AST 16  ALT 11*  ALKPHOS 57  BILITOT 0.5   ------------------------------------------------------------------------------------------------------------------ estimated creatinine clearance is 50.8 mL/min (by C-G formula based on SCr of 0.9 mg/dL). ------------------------------------------------------------------------------------------------------------------ No results for input(s): TSH, T4TOTAL, T3FREE, THYROIDAB in the last 72 hours.  Invalid input(s): FREET3  Coagulation profile No results for input(s): INR, PROTIME in the last 168 hours. ------------------------------------------------------------------------------------------------------------------- No results for input(s): DDIMER in the last 72 hours. -------------------------------------------------------------------------------------------------------------------  Cardiac Enzymes Recent Labs  Lab 01/11/17 2106 01/12/17 0100  TROPONINI 0.24* 0.21*   ------------------------------------------------------------------------------------------------------------------    Component Value Date/Time   BNP 974.1 (H) 07/13/2016 0340     ---------------------------------------------------------------------------------------------------------------  Urinalysis    Component Value Date/Time   COLORURINE STRAW (A) 01/11/2017 1932   APPEARANCEUR CLEAR 01/11/2017 1932   LABSPEC 1.018 01/11/2017 1932   PHURINE 7.0 01/11/2017 1932   GLUCOSEU >=500 (A) 01/11/2017 1932   HGBUR NEGATIVE 01/11/2017 1932   BILIRUBINUR NEGATIVE 01/11/2017 1932   KETONESUR NEGATIVE 01/11/2017 1932   PROTEINUR NEGATIVE 01/11/2017 1932   UROBILINOGEN 1.0  07/13/2013 2255   NITRITE NEGATIVE 01/11/2017 1932   LEUKOCYTESUR NEGATIVE 01/11/2017 1932    ----------------------------------------------------------------------------------------------------------------   Imaging Results:    No results found. EKG pending   Assessment &  Plan:    Principal Problem:   Elevated troponin Active Problems:   CAD (coronary artery disease), native coronary artery   Diabetes mellitus (HCC)   Hyperglycemia    Hyperglycemia, no DKA fsbs ac and qhs, ISS  Troponin elevation Tele Trop I q6h x3 Please consult [name] Prows in am  CAD s/p CABG w recent Vfib arrest CHF (EF 20-25%) STOP NS iv at 23mL per hour  (in ED) Cont aspirin, plavix, lipitor, lisinopril, spironolactone, lasix Consider switching from lisinopril=> ENTRESTO  Dm2 See above  Diabetic neuropathy Cont gabapentin  H/o breast cancer Cont Arimidex  DVT Prophylaxis Lovenox - SCDs   AM Labs Ordered, also please review Full Orders  Family Communication: Admission, patients condition and plan of care including tests being ordered have been discussed with the patient  who indicate understanding and agree with the plan and Code Status.  Code Status FULL CODE  Likely DC to  home  Condition GUARDED    Consults called:  Please call cardiology [name] Prows in AM regarding trop elevation  Admission status: observation  Time spent in minutes : 45   Jani Gravel M.D on 01/12/2017 at 4:04 AM  Between 7am to 7pm - Pager - [phone]  . After 7pm go to [email or web address] - password [facility]  [facility] - Office  [phone]

## 2017-01-12 NOTE — ED Notes (Signed)
Pureed meal tray ordered for pt, pt provided with applesauce and assisted with eating by staff

## 2017-01-12 NOTE — ED Notes (Signed)
[name], pt's daughter called by pt request to come see pt. Pt was tearful when this RN went in for hourly rounding, stating she just wants to go home.

## 2017-01-13 ENCOUNTER — Encounter (HOSPITAL_COMMUNITY): Payer: Self-pay | Admitting: Internal Medicine

## 2017-01-13 ENCOUNTER — Inpatient Hospital Stay (HOSPITAL_COMMUNITY): Payer: Medicare HMO

## 2017-01-13 DIAGNOSIS — R1310 Dysphagia, unspecified: Secondary | ICD-10-CM

## 2017-01-13 DIAGNOSIS — I2510 Atherosclerotic heart disease of native coronary artery without angina pectoris: Secondary | ICD-10-CM

## 2017-01-13 DIAGNOSIS — R0902 Hypoxemia: Secondary | ICD-10-CM

## 2017-01-13 DIAGNOSIS — R05 Cough: Secondary | ICD-10-CM

## 2017-01-13 DIAGNOSIS — I5042 Chronic combined systolic (congestive) and diastolic (congestive) heart failure: Secondary | ICD-10-CM

## 2017-01-13 DIAGNOSIS — L89303 Pressure ulcer of unspecified buttock, stage 3: Secondary | ICD-10-CM

## 2017-01-13 LAB — COMPREHENSIVE METABOLIC PANEL
ALT: 11 U/L — ABNORMAL LOW (ref 14–54)
AST: 13 U/L — ABNORMAL LOW (ref 15–41)
Albumin: 2.7 g/dL — ABNORMAL LOW (ref 3.5–5.0)
Alkaline Phosphatase: 49 U/L (ref 38–126)
Anion gap: 7 (ref 5–15)
BILIRUBIN TOTAL: 0.5 mg/dL (ref 0.3–1.2)
BUN: 7 mg/dL (ref 6–20)
CHLORIDE: 102 mmol/L (ref 101–111)
CO2: 28 mmol/L (ref 22–32)
Calcium: 9.1 mg/dL (ref 8.9–10.3)
Creatinine, Ser: 0.77 mg/dL (ref 0.44–1.00)
Glucose, Bld: 220 mg/dL — ABNORMAL HIGH (ref 65–99)
POTASSIUM: 4 mmol/L (ref 3.5–5.1)
Sodium: 137 mmol/L (ref 135–145)
TOTAL PROTEIN: 5.5 g/dL — AB (ref 6.5–8.1)

## 2017-01-13 LAB — CBC
HCT: 31.7 % — ABNORMAL LOW (ref 36.0–46.0)
Hemoglobin: 9.9 g/dL — ABNORMAL LOW (ref 12.0–15.0)
MCH: 27.2 pg (ref 26.0–34.0)
MCHC: 31.2 g/dL (ref 30.0–36.0)
MCV: 87.1 fL (ref 78.0–100.0)
PLATELETS: 237 10*3/uL (ref 150–400)
RBC: 3.64 MIL/uL — ABNORMAL LOW (ref 3.87–5.11)
RDW: 15.5 % (ref 11.5–15.5)
WBC: 5.1 10*3/uL (ref 4.0–10.5)

## 2017-01-13 LAB — GLUCOSE, CAPILLARY
GLUCOSE-CAPILLARY: 185 mg/dL — AB (ref 65–99)
Glucose-Capillary: 195 mg/dL — ABNORMAL HIGH (ref 65–99)
Glucose-Capillary: 198 mg/dL — ABNORMAL HIGH (ref 65–99)
Glucose-Capillary: 261 mg/dL — ABNORMAL HIGH (ref 65–99)
Glucose-Capillary: 116 mg/dL — ABNORMAL HIGH (ref 65–99)

## 2017-01-13 MED ORDER — ANASTROZOLE 1 MG PO TABS
1.0000 mg | ORAL_TABLET | Freq: Every day | ORAL | Status: DC
Start: 2017-01-13 — End: 2017-01-17
  Administered 2017-01-13 – 2017-01-17 (×5): 1 mg via ORAL
  Filled 2017-01-13 (×5): qty 1

## 2017-01-13 MED ORDER — INSULIN ASPART 100 UNIT/ML ~~LOC~~ SOLN
0.0000 [IU] | Freq: Three times a day (TID) | SUBCUTANEOUS | Status: DC
  Administered 2017-01-13 – 2017-01-14 (×2): 8 [IU] via SUBCUTANEOUS
  Administered 2017-01-14: 5 [IU] via SUBCUTANEOUS
  Administered 2017-01-14: 2 [IU] via SUBCUTANEOUS
  Administered 2017-01-15: 11 [IU] via SUBCUTANEOUS
  Administered 2017-01-15: 3 [IU] via SUBCUTANEOUS
  Administered 2017-01-15: 5 [IU] via SUBCUTANEOUS
  Administered 2017-01-16: 15 [IU] via SUBCUTANEOUS
  Administered 2017-01-16: 5 [IU] via SUBCUTANEOUS
  Administered 2017-01-16: 3 [IU] via SUBCUTANEOUS

## 2017-01-13 MED ORDER — ASPIRIN 325 MG PO TABS
325.0000 mg | ORAL_TABLET | Freq: Every day | ORAL | Status: DC
  Administered 2017-01-14 – 2017-01-17 (×4): 325 mg via ORAL
  Filled 2017-01-13 (×5): qty 1

## 2017-01-13 MED ORDER — GABAPENTIN 100 MG PO CAPS
100.0000 mg | ORAL_CAPSULE | Freq: Three times a day (TID) | ORAL | Status: DC
  Administered 2017-01-13 – 2017-01-17 (×11): 100 mg via ORAL
  Filled 2017-01-13 (×11): qty 1

## 2017-01-13 MED ORDER — VITAMIN D 1000 UNITS PO TABS
1000.0000 [IU] | ORAL_TABLET | Freq: Every day | ORAL | Status: DC
  Administered 2017-01-13 – 2017-01-17 (×5): 1000 [IU] via ORAL
  Filled 2017-01-13 (×5): qty 1

## 2017-01-13 MED ORDER — SPIRONOLACTONE 25 MG PO TABS
12.5000 mg | ORAL_TABLET | Freq: Every day | ORAL | Status: DC
  Administered 2017-01-13 – 2017-01-17 (×5): 12.5 mg via ORAL
  Filled 2017-01-13 (×5): qty 1

## 2017-01-13 MED ORDER — RESOURCE THICKENUP CLEAR PO POWD
ORAL | Status: DC | PRN
  Filled 2017-01-13: qty 125

## 2017-01-13 MED ORDER — INSULIN ASPART 100 UNIT/ML ~~LOC~~ SOLN
4.0000 [IU] | Freq: Three times a day (TID) | SUBCUTANEOUS | Status: DC
  Administered 2017-01-13 – 2017-01-16 (×8): 4 [IU] via SUBCUTANEOUS

## 2017-01-13 MED ORDER — CARVEDILOL 3.125 MG PO TABS
3.1250 mg | ORAL_TABLET | Freq: Two times a day (BID) | ORAL | Status: DC
  Administered 2017-01-13 – 2017-01-16 (×5): 3.125 mg via ORAL
  Filled 2017-01-13 (×7): qty 1

## 2017-01-13 MED ORDER — ESCITALOPRAM OXALATE 10 MG PO TABS
20.0000 mg | ORAL_TABLET | Freq: Every day | ORAL | Status: DC
  Administered 2017-01-13 – 2017-01-17 (×5): 20 mg via ORAL
  Filled 2017-01-13 (×5): qty 2

## 2017-01-13 MED ORDER — INSULIN ASPART 100 UNIT/ML ~~LOC~~ SOLN
0.0000 [IU] | SUBCUTANEOUS | Status: DC
  Administered 2017-01-13 (×2): 3 [IU] via SUBCUTANEOUS

## 2017-01-13 MED ORDER — FUROSEMIDE 40 MG PO TABS
40.0000 mg | ORAL_TABLET | Freq: Every day | ORAL | Status: DC
  Administered 2017-01-13: 40 mg via ORAL
  Filled 2017-01-13: qty 1

## 2017-01-13 MED ORDER — INSULIN GLARGINE 100 UNIT/ML ~~LOC~~ SOLN
10.0000 [IU] | SUBCUTANEOUS | Status: DC
  Administered 2017-01-13 – 2017-01-16 (×4): 10 [IU] via SUBCUTANEOUS
  Filled 2017-01-13 (×4): qty 0.1

## 2017-01-13 MED ORDER — CLOPIDOGREL BISULFATE 75 MG PO TABS
75.0000 mg | ORAL_TABLET | Freq: Every day | ORAL | Status: DC
  Administered 2017-01-13 – 2017-01-17 (×5): 75 mg via ORAL
  Filled 2017-01-13 (×5): qty 1

## 2017-01-13 MED ORDER — INSULIN ASPART 100 UNIT/ML ~~LOC~~ SOLN
0.0000 [IU] | Freq: Every day | SUBCUTANEOUS | Status: DC
  Administered 2017-01-14 – 2017-01-16 (×3): 2 [IU] via SUBCUTANEOUS

## 2017-01-13 MED ORDER — LISINOPRIL 10 MG PO TABS
10.0000 mg | ORAL_TABLET | Freq: Every day | ORAL | Status: DC
  Administered 2017-01-13 – 2017-01-17 (×5): 10 mg via ORAL
  Filled 2017-01-13 (×5): qty 1

## 2017-01-13 NOTE — Evaluation (Addendum)
Supervised and reviewed by Herbie Baltimore MA CCC-SLP - Of note, pt and family would like to avoid repeat MBS    Clinical/Bedside Swallow Evaluation Patient Details  Name: [name] [name] MRN: [id number] Date of Birth: 1940-10-24  Today's Date: 01/13/2017 Time: SLP Start Time (ACUTE ONLY): 9021 SLP Stop Time (ACUTE ONLY): 0840 SLP Time Calculation (min) (ACUTE ONLY): 19 min  Past Medical History:  Past Medical History:  Diagnosis Date  . Angina   . Cardiac arrest ([place]) 12/29/2016  . Cardiac arrest with ventricular fibrillation ([place])   . CHF (congestive heart failure) ([place])   . Complication of anesthesia    lung   . Diabetes mellitus ([place])   . GERD (gastroesophageal reflux disease)   . H/O hiatal hernia   . Hypercholesteremia   . Hypertension   . Myocardial infarction [facility])    CABG 2001, ? stent. Cath 02/2011 with new distal LIMA-LAD 80%. SVG-OM occlusion is old, rest of grafts patent  . NSTEMI (non-ST elevated myocardial infarction) ([place]) 07/27/2011  . Peripheral vascular disease ([place])   . Stroke [facility]) 2002 and 2003   Resultant right hemiparesis and aphasia. Communicates by writing    Past Surgical History:  Past Surgical History:  Procedure Laterality Date  . ABDOMINAL HYSTERECTOMY    . BREAST BIOPSY    . BREAST CYST EXCISION Left    years ago per daughter  . CARDIAC CATHETERIZATION  02/2011  . CORONARY ARTERY BYPASS GRAFT    . ESOPHAGOGASTRODUODENOSCOPY (EGD) N/A 03/12/2011   Performed by [name], MD at [place]  . LEFT HEART CATHETERIZATION WITH CORONARY ANGIOGRAM N/A 03/10/2011   Performed by [name], MD at [facility] CATH LAB   HPI:  The patient is a 76 year old female who has a known history of congestive heart failure, diabetes, myocardial infarction status post stenting years ago as well as a history of a cardiac arrest that occurred approximately 2 weeks ago. She was resuscitated in the hospital in the intensive care unit and was discharged on November 13  after 8 days in the hospital. 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 She was in her usual state of health at her nursing facility when she was found to have a high blood sugar.   Assessment / Plan / Recommendation Clinical Impression  Pt seen at bedside for BSE; tolerated trials of thin liquid, nectar-thick liquid and puree consistencies with 1x immediate cough noted with nectar-thick liquid likely due to rapid intake. Otherwise, no overt s/sx of aspiration noted. Pt demonstrates difficulty orally controlling the bolus marked by anterior spillage, decreased lingual control and occasional difficulty with posterior transit of puree; deficits present at baseline level of functioning. Pt is aware of baseline deficits and implements compensatory strategies during swallow. Pt occasionally demonstrates an untimely swallow initiation with thin liquids; cued pt to sip, trigger swallow and pause before taking additional sips. Pt requested regular diet with thin liquids for QOL; however, after speaking with her daughter, she vocalized hesitance to upgrade pt's diet to thin. Risk of aspiration persists despite liquid consistency secondary to pt's medical history, previous swallow studies and baseline deficits. For now, recommend Dys 2 (chopped) diet with nectar thick liquid and meds crushed in thickened liquid; RN informed that water is allowed between meals. Will continue to follow with SLP intervention targeting dysphagia management and diet tolerance.    SLP Visit Diagnosis: Dysphagia, oropharyngeal phase (R13.12);Dysphagia, oral phase (  R13.11)    Aspiration Risk  Mild aspiration risk    Diet Recommendation Dysphagia 2 (Fine chop);Nectar-thick liquid;Other (Comment)(Water (thin) between meals)   Liquid Administration via: Cup;Straw Medication Administration: Other (Comment)(Crushed in nectar thick liquid) Supervision: Staff to  assist with self feeding Compensations: Slow rate;Small sips/bites(Cue for swallow as needed) Postural Changes: Seated upright at 90 degrees;Remain upright for at least 30 minutes after po intake    Other  Recommendations Oral Care Recommendations: Oral care BID Other Recommendations: Order thickener from pharmacy   Follow up Recommendations 24 hour supervision/assistance      Frequency and Duration min 2x/week  2 weeks       Prognosis Prognosis for Safe Diet Advancement: Good Barriers to Reach Goals: Severity of deficits      Swallow Study   General HPI: The patient is a 76 year old female who has a known history of congestive heart failure, diabetes, myocardial infarction status post stenting years ago as well as a history of a cardiac arrest that occurred approximately 2 weeks ago. She was resuscitated in the hospital in the intensive care unit and was discharged on November 13 after 8 days in the hospital. 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 She was in her usual state of health at her nursing facility when she was found to have a high blood sugar. Type of Study: Bedside Swallow Evaluation Previous Swallow Assessment: MBS see history Diet Prior to this Study: NPO Temperature Spikes Noted: No Respiratory Status: Room air History of Recent Intubation: No Behavior/Cognition: Alert;Pleasant mood;Cooperative Oral Cavity Assessment: Within Functional Limits Oral Care Completed by SLP: No Oral Cavity - Dentition: Edentulous Vision: Functional for self-feeding Self-Feeding Abilities: Able to feed self;Needs assist Patient Positioning: Upright in bed Baseline Vocal Quality: Not observed    Oral/Motor/Sensory Function Overall Oral Motor/Sensory Function: Severe impairment Facial ROM: Reduced right;Suspected CN VII (facial) dysfunction Facial Symmetry: Abnormal symmetry right;Suspected CN VII  (facial) dysfunction Facial Strength: Reduced right;Suspected CN VII (facial) dysfunction Facial Sensation: Within Functional Limits Lingual ROM: Suspected CN XII (hypoglossal) dysfunction;Reduced right;Reduced left Lingual Symmetry: Abnormal symmetry right Lingual Strength: Reduced Velum: Other (comment)(Did not phonate) Mandible: Impaired   Ice Chips Ice chips: Not tested   Thin Liquid Thin Liquid: Impaired Presentation: Straw Oral Phase Impairments: Reduced labial seal;Reduced lingual movement/coordination Oral Phase Functional Implications: Right anterior spillage;Left anterior spillage Pharyngeal  Phase Impairments: Suspected delayed Swallow    Nectar Thick Nectar Thick Liquid: Impaired Presentation: Cup;Self Fed Oral Phase Impairments: Reduced labial seal;Reduced lingual movement/coordination Oral phase functional implications: Right anterior spillage;Left anterior spillage   Honey Thick Honey Thick Liquid: Not tested   Puree Puree: Impaired Presentation: Self Fed;Spoon Oral Phase Impairments: Reduced labial seal;Reduced lingual movement/coordination Oral Phase Functional Implications: Right anterior spillage;Left anterior spillage;Prolonged oral transit   Solid   GO   Solid: Not tested        Aaron Edelman, Student SLP 01/13/2017,9:45 AM

## 2017-01-13 NOTE — Plan of Care (Signed)
Pt. Uses call light to call for assistance.

## 2017-01-13 NOTE — Consult Note (Signed)
[place] Nurse wound consult note Reason for Consult: sacrum Bedside nurse mentions leg wound when I arrive as well.  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 Appears chronic RLE: pale with yellow slough scattered, moist Drainage (amount, consistency, odor) none from either site as the time of my assessment today, no odor Periwound: intact, gluteal cleft is wound edges are macerated and have epibole  Dressing procedure/placement/frequency: Soft silicone foam for both wounds to protect and insulate.  Making sure that the foam in the gluteal cleft is folded and placed deep in the fold to allow the dressing surface to touch the wound.  Change every 3 days and PRN soilage.  Patient is able to turn herself, will not order air mattress for this reason.   Discussed POC with patient and bedside nurse.  Re consult if needed, will not follow at this time. Thanks  Brittnee Gaetano [employer], RN,CWOCN, CNS, [place] [phone])

## 2017-01-13 NOTE — Progress Notes (Signed)
Progress Note    [name] [name]  MIW:[id number] DOB: 30-Aug-1940  DOA: 01/11/2017 PCP: [name], MD    Brief Narrative:   Chief complaint: Follow-up hyperglycemia and elevated troponin  Medical records reviewed and are as summarized below:  [name] [name] is an 76 y.o. female with PMH of breast cancer treated with anastrozole, hypertension, hyperlipidemia, type 2 diabetes, PVD, CVA with aphasia, CAD status post CABG, chronic systolic CHF with an EF of 25-30 percent, moderate aortic stenosis, recent hospitalization 12/29/16 for non-STEMI with V. fib arrest who was admitted 01/11/17 for evaluation of hyperglycemia. Troponin was checked and found to be 0.24 on presentation, however the patient did not have any complaints of chest pain.  Assessment/Plan:   Principal Problem:   Elevated troponin in the setting of recent non-STEMI/V. fib arrest/chronic systolic and diastolic CHF/known CAD/hypertensive heart disease/hypertension Troponin mildly elevated but trend flat and the patient is asymptomatic. Medical therapy has been recommended in the past as the patient not felt to be a candidate for aggressive therapy/surgery. 12-lead EKG personally reviewed and showed premature atrial complex, QTC 470 ms, and some nonspecific T waves but no frank ischemic changes. Continue aspirin, beta blocker, when necessary nitroglycerin. The patient denies chest pain. Resume oral aspirin/Plavix, Coreg, Lasix, spironolactone, lisinopril and statin.  Active Problems:   Cough/hypoxia Patient hypoxic overnight with an oxygen saturation as low as 64%. Given the patient's mild elevation in troponin and cough with productive sputum and her known high risk of aspiration, will check a portable chest x-ray. Patient may have an underlying aspiration pneumonia with associated demand ischemia.    Diabetes mellitus (HCC)/Hyperglycemia Currently being managed with insulin sensitive SSI every 4 hours. CBGs 183-294. Add  Lantus 10 units daily. Change SSI moderate scale with 4 units of meal coverage. Hemoglobin A1c 9.4% on 12/29/16. Consult diabetes coordinator.    History of stroke with dysphasia and right hemiparesis Status post speech therapy evaluation. Diet advanced to dysphagia 2.    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 Stage 3 pressure injury: deep gluteal cleft proximal: 1.0cm x 0.3cm x 0.2cm  Full thickness, non pressure ulcer right posterior calf: 1.0cm x 1.0cm x 0.3cm. Continue wound care per wound care nurses recommendations.    History of breast cancer Continue anastrozole.    Morbid obesity given risk factors Body mass index is 33.83 kg/m. Patient has diabetes and coronary artery disease in the setting of obesity.   Family Communication/Anticipated D/C date and plan/Code Status   DVT prophylaxis: Lovenox ordered. Code Status: Full Code.  Family Communication: Daughter updated by telephone. Disposition Plan: SNF (from [facility])   Medical Consultants:    None.   Anti-Infectives:    None  Subjective:   Denies chest pain, dyspnea. Has a cough productive of sputum. Says she is hungry and she wants to eat.  Objective:    Vitals:   01/12/17 1900 01/12/17 2205 01/12/17 2345 01/13/17 0416  BP: (!) 166/91 136/73 (!) 143/57 (!) 116/57  Pulse: 75 84 68 90  Resp:      Temp:  98.1 F (36.7 C) 98.8 F (37.1 C) 98.8 F (37.1 C)  TempSrc:  Oral Oral Oral  SpO2: 94% 92% (!) 89% (!) 64%  Weight:    92.2 kg (203 lb 4.8 oz)  Height:  5\' 5"  (1.651 m)      Intake/Output Summary (Last 24 hours) at 01/13/2017 0724 Last data filed at 01/12/2017 2133 Gross  per 24 hour  Intake 3 ml  Output -  Net 3 ml   Filed Weights   01/13/17 0416  Weight: 92.2 kg (203 lb 4.8 oz)    Exam: General: No acute distress. Awake and alert.  Cardiovascular: Heart sounds show a regular rate, and rhythm. No gallops or  rubs. No murmurs. No JVD. Lungs: Diminished breath sounds at the bases with rhonchi in upper airways and a congested cough. Abdomen: Soft, nontender, nondistended with normal active bowel sounds. No masses. No hepatosplenomegaly. Neurological: Alert and oriented 2. Right hemiparesis with aphasia from old stroke. Skin: Warm and dry. No rashes or lesions. Extremities: No clubbing or cyanosis. Trace edema. Pedal pulses 2+. Psychiatric: Mood and affect are depressed/flat. Insight and judgment are fair.  Data Reviewed:   I have personally reviewed following labs and imaging studies:  Labs: Labs show the following:   Basic Metabolic Panel: Recent Labs  Lab 01/11/17 2106  NA 136  K 4.7  CL 99*  CO2 31  GLUCOSE 376*  BUN 8  CREATININE 0.90  CALCIUM 9.3   GFR Estimated Creatinine Clearance: 60.6 mL/min (by C-G formula based on SCr of 0.9 mg/dL). Liver Function Tests: Recent Labs  Lab 01/11/17 2106  AST 16  ALT 11*  ALKPHOS 57  BILITOT 0.5  PROT 6.4*  ALBUMIN 3.0*   CBC: Recent Labs  Lab 01/11/17 2106  WBC 5.7  NEUTROABS 4.0  HGB 10.3*  HCT 33.9*  MCV 86.7  PLT 224   Cardiac Enzymes: Recent Labs  Lab 01/11/17 2106 01/12/17 0100 01/12/17 0615 01/12/17 1315 01/12/17 1912  TROPONINI 0.24* 0.21* 0.23* 0.21* 0.23*   BNP (last 3 results) No results for input(s): PROBNP in the last 8760 hours. CBG: Recent Labs  Lab 01/12/17 1210 01/12/17 1512 01/12/17 1751 01/12/17 2125 01/13/17 0514  GLUCAP 248* 294* 183* 289* 185*    Microbiology No results found for this or any previous visit (from the past 240 hour(s)).  Procedures and diagnostic studies:  No results found.  Medications:   . aspirin  300 mg Rectal Daily  . enoxaparin (LOVENOX) injection  40 mg Subcutaneous Q24H  . insulin aspart  0-9 Units Subcutaneous Q4H  . mouth rinse  15 mL Mouth Rinse BID  . metoprolol tartrate  2.5 mg Intravenous Q6H  . sodium chloride flush  3 mL Intravenous Q12H    Continuous Infusions: . sodium chloride       LOS: 1 day   Jacquelynn Cree  [facility] Pager [phone]. If unable to reach me by pager, please call my cell phone at [phone].  *Please refer to [email or web address], password [email or web address] to get updated schedule on who will round on this patient, as hospitalists switch teams weekly. If 7PM-7AM, please contact night-coverage at [email or web address], password [email or web address] for any overnight needs.  01/13/2017, 7:24 AM

## 2017-01-14 ENCOUNTER — Telehealth: Payer: Self-pay | Admitting: Oncology

## 2017-01-14 LAB — GLUCOSE, CAPILLARY
GLUCOSE-CAPILLARY: 126 mg/dL — AB (ref 65–99)
Glucose-Capillary: 222 mg/dL — ABNORMAL HIGH (ref 65–99)
Glucose-Capillary: 300 mg/dL — ABNORMAL HIGH (ref 65–99)
Glucose-Capillary: 233 mg/dL — ABNORMAL HIGH (ref 65–99)

## 2017-01-14 MED ORDER — GLUCERNA SHAKE PO LIQD
237.0000 mL | Freq: Two times a day (BID) | ORAL | Status: DC
  Administered 2017-01-15 – 2017-01-17 (×3): 237 mL via ORAL

## 2017-01-14 MED ORDER — ADULT MULTIVITAMIN W/MINERALS CH
1.0000 | ORAL_TABLET | Freq: Every day | ORAL | Status: DC
  Administered 2017-01-14 – 2017-01-17 (×4): 1 via ORAL
  Filled 2017-01-14 (×3): qty 1

## 2017-01-14 MED ORDER — FUROSEMIDE 10 MG/ML IJ SOLN
40.0000 mg | Freq: Two times a day (BID) | INTRAMUSCULAR | Status: DC
  Administered 2017-01-14 – 2017-01-16 (×5): 40 mg via INTRAVENOUS
  Filled 2017-01-14 (×5): qty 4

## 2017-01-14 NOTE — Progress Notes (Signed)
  Speech Language Pathology Treatment: Dysphagia  Patient Details Name: [name] [name] MRN: [id number] DOB: [birth year] Today's Date: 01/14/2017 Time: 9476-5465 SLP Time Calculation (min) (ACUTE ONLY): 14 min  Assessment / Plan / Recommendation Clinical Impression  SLP provided skilled observation during breakfast meal with thin liquids on pt's meal tray. SLP reviewed recommendation from previous date to continue with nectar thick liquids during meal times. Pt/daughter wanted to see how she would do with thin liquids during a meal. Pt had frequent coughing with thin liquids, even with Mod cues for smaller sips and pausing in between each sip. Daughter verbalized her understanding of the water protocol and its rationale for reducing the risk of infection. Pt and her daughter are both in agreement for continuing Dys 2 diet and nectar thick liquids but allowing thin water in between meals for pleasure. Pt's diet order was updated. Will continue to follow as she remains in house for education and reinforcement of recommendations.    HPI HPI: The patient is a 76 year old female who has a known history of congestive heart failure, diabetes, myocardial infarction status post stenting years ago as well as a history of a cardiac arrest that occurred approximately 2 weeks ago. She was resuscitated in the hospital in the intensive care unit and was discharged on November 13 after 8 days in the hospital. 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 She was in her usual state of health at her nursing facility when she was found to have a high blood sugar.      SLP Plan  Continue with current plan of care       Recommendations  Diet recommendations: Dysphagia 2 (fine chop);Nectar-thick liquid;Other(comment)(thin water okay in between meals) Liquids provided via: Cup;Straw Medication Administration: Other (Comment)(crushed in nectar  thick liquid) Supervision: Patient able to self feed Compensations: Slow rate;Small sips/bites Postural Changes and/or Swallow Maneuvers: Seated upright 90 degrees;Upright 30-60 min after meal                Oral Care Recommendations: Oral care BID Follow up Recommendations: 24 hour supervision/assistance SLP Visit Diagnosis: Dysphagia, oropharyngeal phase (R13.12);Dysphagia, oral phase (R13.11) Plan: Continue with current plan of care       GO                Germain Osgood 01/14/2017, 9:37 AM  Germain Osgood, M.A. CCC-SLP [phone]

## 2017-01-14 NOTE — Progress Notes (Signed)
Progress Note    [name] [name]  ERX:[id number] DOB: 1940-08-03  DOA: 01/11/2017 PCP: [name], MD    Brief Narrative:   Chief complaint: Follow-up hyperglycemia and elevated troponin  Medical records reviewed and are as summarized below:  [name] [name] is an 76 y.o. female with PMH of breast cancer treated with anastrozole, hypertension, hyperlipidemia, type 2 diabetes, PVD, CVA with aphasia, CAD status post CABG, chronic systolic CHF with an EF of 25-30 percent, moderate aortic stenosis, recent hospitalization 12/29/16 for non-STEMI with V. fib arrest who was admitted 01/11/17 for evaluation of hyperglycemia. Troponin was checked and found to be 0.24 on presentation, however the patient did not have any complaints of chest pain.  Assessment/Plan:   Principal Problem:   Elevated troponin in the setting of recent non-STEMI/V. fib arrest/chronic systolic and diastolic CHF/known CAD/hypertensive heart disease/hypertension Troponin mildly elevated but trend flat and the patient denies chest pain. Medical therapy has been recommended in the past as the patient not felt to be a candidate for aggressive therapy/surgery. 12-lead EKG negative for ischemic changes. Continue PRN NTG, aspirin/Plavix, Coreg, spironolactone, lisinopril and statin. Increase Lasix to 40 mg IV twice a day given CHF on chest x-ray, which is likely the etiology of her elevated troponins.   Active Problems:   Cough/hypoxia Patient hypoxic with an oxygen saturation as low as 64% overnight 01/13/17. Chest x-ray subsequently showed findings consistent with CHF.    Diabetes mellitus (HCC)/Hyperglycemia Currently being managed with SSI moderate scale with 4 units of meal coverage and 10 units of Lantus. CBGs better controlled with last 2 readings in 116, 126. Hemoglobin A1c 9.4% on 12/29/16. Diabetes coordinator consulted.    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 Felt to be at mild risk for aspiration. Diet advanced to dysphagia 2 with nectar thickened liquids.    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 Stage 3 pressure injury: deep gluteal cleft proximal: 1.0cm x 0.3cm x 0.2cm  Full thickness, non pressure ulcer right posterior calf: 1.0cm x 1.0cm x 0.3cm. Continue wound care per wound care nurses recommendations.    History of breast cancer Continue anastrozole.    Morbid obesity given risk factors Body mass index is 33.27 kg/m. Patient has diabetes and coronary artery disease in the setting of obesity.   Family Communication/Anticipated D/C date and plan/Code Status   DVT prophylaxis: Lovenox ordered. Code Status: Full Code.  Family Communication: Daughter updated by telephone. Disposition Plan: SNF (from [facility]), possibly 01/15/17 if respiratory status stable and no further hypoxic events.   Medical Consultants:    None.   Anti-Infectives:    None  Subjective:   Continues to deny chest pain and dyspnea. Does cough at times and has a prominent drool.   Objective:    Vitals:   01/13/17 1624 01/13/17 2101 01/14/17 0046 01/14/17 0449  BP: 135/71 114/66 (!) 141/62 (!) 139/50  Pulse: 61 63 62 63  Resp:  18 18 18   Temp:  98.6 F (37 C) 97.8 F (36.6 C) 98.5 F (36.9 C)  TempSrc:  Oral Oral Oral  SpO2: 100% 97% 100% 90%  Weight:    90.7 kg (199 lb 15.3 oz)  Height:        Intake/Output Summary (Last 24 hours) at 01/14/2017 0745 Last data filed at 01/13/2017 2259 Gross per 24 hour  Intake 600 ml  Output 450 ml  Net 150 ml   Filed Weights   01/13/17 0416 01/14/17 0449  Weight: 92.2 kg (203 lb 4.8 oz) 90.7 kg (199 lb 15.3 oz)    Exam: General: No acute distress. Debilitated female with a prominent drool. Cardiovascular: Heart sounds show a regular rate, and rhythm. No gallops or rubs. No murmurs. No JVD. Lungs:  Scattered bilateral rhonchi with a congested cough. Abdomen: Soft, nontender, nondistended with normal active bowel sounds. No masses. No hepatosplenomegaly. Neurological: Alert and oriented 2. Right hemiparesis with aphasia. Skin: Warm and dry. No rashes or lesions. Extremities: No clubbing or cyanosis. Trace edema. Pedal pulses 2+. Psychiatric: Mood and affect are depressed/flat. Insight and judgment are fair.  Data Reviewed:   I have personally reviewed following labs and imaging studies:  Labs: Labs show the following:   Basic Metabolic Panel: Recent Labs  Lab 01/11/17 2106 01/13/17 0611  NA 136 137  K 4.7 4.0  CL 99* 102  CO2 31 28  GLUCOSE 376* 220*  BUN 8 7  CREATININE 0.90 0.77  CALCIUM 9.3 9.1   GFR Estimated Creatinine Clearance: 67.6 mL/min (by C-G formula based on SCr of 0.77 mg/dL). Liver Function Tests: Recent Labs  Lab 01/11/17 2106 01/13/17 0611  AST 16 13*  ALT 11* 11*  ALKPHOS 57 49  BILITOT 0.5 0.5  PROT 6.4* 5.5*  ALBUMIN 3.0* 2.7*   CBC: Recent Labs  Lab 01/11/17 2106 01/13/17 0611  WBC 5.7 5.1  NEUTROABS 4.0  --   HGB 10.3* 9.9*  HCT 33.9* 31.7*  MCV 86.7 87.1  PLT 224 237   Cardiac Enzymes: Recent Labs  Lab 01/11/17 2106 01/12/17 0100 01/12/17 0615 01/12/17 1315 01/12/17 1912  TROPONINI 0.24* 0.21* 0.23* 0.21* 0.23*   CBG: Recent Labs  Lab 01/13/17 0728 01/13/17 1148 01/13/17 1623 01/13/17 2104 01/14/17 0735  GLUCAP 195* 198* 261* 116* 126*    Microbiology No results found for this or any previous visit (from the past 240 hour(s)).  Procedures and diagnostic studies:  01/13/17 Dg Chest Port 1 View: My independent review of the image shows: Interstitial edema with Kerley B lines consistent with CHF. Median sternotomy wires present.    Medications:   . anastrozole  1 mg Oral Daily  . aspirin  325 mg Oral Daily  . carvedilol  3.125 mg Oral BID WC  . cholecalciferol  1,000 Units Oral Daily  . clopidogrel  75  mg Oral Daily  . enoxaparin (LOVENOX) injection  40 mg Subcutaneous Q24H  . escitalopram  20 mg Oral Daily  . furosemide  40 mg Oral Daily  . gabapentin  100 mg Oral TID  . insulin aspart  0-15 Units Subcutaneous TID WC  . insulin aspart  0-5 Units Subcutaneous QHS  . insulin aspart  4 Units Subcutaneous TID WC  . insulin glargine  10 Units Subcutaneous [id number]  . lisinopril  10 mg Oral Daily  . mouth rinse  15 mL Mouth Rinse BID  . sodium chloride flush  3 mL Intravenous Q12H  . spironolactone  12.5 mg Oral Daily   Continuous Infusions: . sodium chloride       LOS: 2 days   Jacquelynn Cree  [facility] Pager [phone]. If unable to reach me by pager, please call my cell phone at [phone].  *Please refer to [email or web address], password [email or web address] to get updated schedule on who will round on this patient, as hospitalists switch teams weekly. If 7PM-7AM, please contact night-coverage at [email or web address], password [email or web address] for  any overnight needs.  01/14/2017, 7:45 AM

## 2017-01-14 NOTE — Progress Notes (Signed)
Inpatient Diabetes Program Recommendations  AACE/ADA: New Consensus Statement on Inpatient Glycemic Control (2015)  Target Ranges:  Prepandial:   less than 140 mg/dL      Peak postprandial:   less than 180 mg/dL (1-2 hours)      Critically ill patients:  140 - 180 mg/dL   Results for [name] [name], [name] [name] (MRN [id number]) as of 01/14/2017 09:57  Ref. Range 01/13/2017 07:28 01/13/2017 11:48 01/13/2017 16:23 01/13/2017 21:04 01/14/2017 07:35  Glucose-Capillary Latest Ref Range: 65 - 99 mg/dL 195 (H) 198 (H) 261 (H) 116 (H) 126 (H)   Review of Glycemic Control  Diabetes history: DM2 Outpatient Diabetes medications: Novolog 0-10 units TID with meals Current orders for Inpatient glycemic control: Lantus 10 units QAM, Novolog 4 units TID with meals, Novolog 0-15 units TID with meals, Novolog 0-5 units QHS  Inpatient Diabetes Program Recommendations:  Discharge plan for DM control: At time of discharge, recommend restarting basal insulin (in addition to Novolog meal coverage and correction). Would recommend discharging on similar insulin regimen being used as an inpatient if glucose is fairly well controlled.  Note: Noted consult. Chart reviewed.  In reviewing chart, noted patient was taking Lantus as an outpatient along with Novolog prior to last hospital admission. However, when discharged from the hospital to SNF on 01/06/17 Lantus 20 units QHS was discontinued. Patient was sent to the hospital from SNF due to hyperglycemia. Patient will likely need to resume basal insulin. MD, may want to discharge on similar insulin regimen as being used while inpatient if glucose is fairly well controlled.  Thanks, Barnie Alderman, RN, MSN, CDE Diabetes Coordinator Inpatient Diabetes Program [phone] (Team Pager from 8am to 5pm)

## 2017-01-14 NOTE — Evaluation (Signed)
Physical Therapy Evaluation Patient Details Name: [name] [name] MRN: [id number] DOB: [birth year] Today's Date: 01/14/2017   History of Present Illness  Pt adm with elevated troponin in setting of recent MI. PMH - CVA with residual rt weakness, htn, pvd, mi, htn, breast CA, chf, dm  Clinical Impression  Pt admitted with above diagnosis and presents to PT with functional limitations due to deficits listed below (See PT problem list). Pt needs skilled PT to maximize independence and safety to allow discharge back to SNF. Pt motivated to maximize her function and independence.     Follow Up Recommendations SNF    Equipment Recommendations  None recommended by PT    Recommendations for Other Services       Precautions / Restrictions Precautions Precautions: Fall Restrictions Weight Bearing Restrictions: No      Mobility  Bed Mobility Overal bed mobility: Needs Assistance Bed Mobility: Supine to Sit;Sit to Sidelying   Sidelying to sit: Min assist     Sit to sidelying: Mod assist General bed mobility comments: Assist to elevate trunk into sitting and assist to return legs to bed  Transfers Overall transfer level: Needs assistance Equipment used: Ambulation equipment used Transfers: Sit to/from Stand Sit to Stand: Mod assist         General transfer comment: Assist to elevate hips and trunk. Pt performed x 4.  Ambulation/Gait                Stairs            Wheelchair Mobility    Modified Rankin (Stroke Patients Only)       Balance Overall balance assessment: Needs assistance Sitting-balance support: Feet supported;No upper extremity supported Sitting balance-Leahy Scale: Fair     Standing balance support: Single extremity supported Standing balance-Leahy Scale: Poor Standing balance comment: Pt stood with stedy x 4 for 30-60 sec each time. Min to mod assist for static standing. Rt knee flexing especially with fatigue.                              Pertinent Vitals/Pain Pain Assessment: Faces Faces Pain Scale: No hurt    Home Living Family/patient expects to be discharged to:: Skilled nursing facility                      Prior Function Level of Independence: Needs assistance   Gait / Transfers Assistance Needed: During recent adm pt +2 max assist for transfers           Hand Dominance   Dominant Hand: Left    Extremity/Trunk Assessment   Upper Extremity Assessment Upper Extremity Assessment: Defer to OT evaluation    Lower Extremity Assessment Lower Extremity Assessment: RLE deficits/detail RLE Deficits / Details: hemiparesis due to prior CVA    Cervical / Trunk Assessment Cervical / Trunk Assessment: Kyphotic  Communication   Communication: Expressive difficulties;Other (comment)(prior cva. Writes to communicate or shakes/nods head)  Cognition Arousal/Alertness: Awake/alert Behavior During Therapy: WFL for tasks assessed/performed Overall Cognitive Status: Difficult to assess                                 General Comments: Appears functional      General Comments      Exercises     Assessment/Plan    PT Assessment Patient needs continued PT services  PT Problem List Decreased  strength;Decreased balance;Decreased activity tolerance;Decreased mobility;Obesity       PT Treatment Interventions DME instruction;Functional mobility training;Therapeutic activities;Therapeutic exercise;Balance training;Neuromuscular re-education;Patient/family education    PT Goals (Current goals can be found in the Care Plan section)  Acute Rehab PT Goals Patient Stated Goal: Pt unable to state PT Goal Formulation: With patient Time For Goal Achievement: 01/28/17 Potential to Achieve Goals: Good    Frequency Min 2X/week   Barriers to discharge        Co-evaluation               AM-PAC PT "6 Clicks" Daily Activity  Outcome Measure Difficulty turning over in bed  (including adjusting bedclothes, sheets and blankets)?: Unable Difficulty moving from lying on back to sitting on the side of the bed? : Unable Difficulty sitting down on and standing up from a chair with arms (e.g., wheelchair, bedside commode, etc,.)?: Unable Help needed moving to and from a bed to chair (including a wheelchair)?: A Lot Help needed walking in hospital room?: Total Help needed climbing 3-5 steps with a railing? : Total 6 Click Score: 7    End of Session Equipment Utilized During Treatment: Gait belt Activity Tolerance: Patient tolerated treatment well Patient left: in bed;with call bell/phone within reach;with bed alarm set Nurse Communication: Mobility status PT Visit Diagnosis: Hemiplegia and hemiparesis;Other abnormalities of gait and mobility (R26.89) Hemiplegia - Right/Left: Right Hemiplegia - dominant/non-dominant: Non-dominant Hemiplegia - caused by: Cerebral infarction    Time: 1513-1540 PT Time Calculation (min) (ACUTE ONLY): 27 min   Charges:   PT Evaluation $PT Eval Moderate Complexity: 1 Mod PT Treatments $Therapeutic Activity: 8-22 mins   PT G Codes:        [facility] PT [place] 01/14/2017, 4:07 PM

## 2017-01-14 NOTE — Progress Notes (Signed)
Initial Nutrition Assessment  DOCUMENTATION CODES:   Obesity unspecified  INTERVENTION:   -Glucerna Shake po BID, each supplement provides 220 kcal and 10 grams of protein -MVI daily  NUTRITION DIAGNOSIS:   Increased nutrient needs related to wound healing as evidenced by estimated needs.  GOAL:   Patient will meet greater than or equal to 90% of their needs  MONITOR:   PO intake, Supplement acceptance, Diet advancement, Weight trends, Labs, Skin, I & O's  REASON FOR ASSESSMENT:   Low Braden    ASSESSMENT:    [name] [name]  is a 76 y.o. female, w hypertension, hyperlipidemia, Dm2, Pvd CVA, CAD s/p CABG, CHF (EF 25-30%),  Moderate Aortic Stenosis, w recent admission on 12/29/2016 for VFib arrest, NSTEMI  who presents to ED due to hyperglycemia.  Somehow a troponin was ordered and was noted to be positive at 0.24 (prev 1.11 on 12/30/2016),  Pt denies cp, palp, sob beyond her baseline, n/v, diarrhea, brbpr.  Pt admitted with elevated troponin.   11/20- s/p BSE- diet downgraded to dysphagia 2 with nectar thick liquids  Pt sitting on side of bed at time of visit. Pt acknowledged RD presence, but did not respond to questions. Meal completion 50-75%.  Reviewed wt hx; no wt loss noted.   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   Labs reviewed: (inpatient orders for glycemic control are 0-15 units insulin aspart TID, 0-5 units insulin aspart daily q HS, 4 units insulin aspart TID with meals, 10 units insulin glargine q AM).   NUTRITION - FOCUSED PHYSICAL EXAM:    Most Recent Value  Orbital Region  No depletion  Upper Arm Region  No depletion  Thoracic and Lumbar Region  No depletion  Buccal Region  No depletion  Temple Region  No depletion  Clavicle Bone Region  No depletion  Clavicle and Acromion Bone Region  No depletion  Scapular Bone Region  No depletion  Dorsal Hand  No depletion  Patellar Region  No  depletion  Anterior Thigh Region  No depletion  Posterior Calf Region  No depletion  Edema (RD Assessment)  None  Hair  Reviewed  Eyes  Reviewed  Mouth  Reviewed  Skin  Reviewed  Nails  Reviewed       Diet Order:  DIET DYS 2 Room service appropriate? Yes; Fluid consistency: Nectar Thick  EDUCATION NEEDS:   No education needs have been identified at this time  Skin:  Skin Assessment: Skin Integrity Issues: Skin Integrity Issues:: Stage III, Other (Comment) Stage III: gluteal cleft Other:  full thickness non-pressure wound rt posterior leg  Last BM:  01/14/17  Height:   Ht Readings from Last 1 Encounters:  01/12/17 5\' 5"  (1.651 m)    Weight:   Wt Readings from Last 1 Encounters:  01/14/17 199 lb 15.3 oz (90.7 kg)    Ideal Body Weight:  56.8 kg  BMI:  Body mass index is 33.27 kg/m.  Estimated Nutritional Needs:   Kcal:  1700-1900  Protein:  85-100 grams  Fluid:  > 1.7 L    Kenedi Cilia A. Jimmye Norman, RD, LDN, CDE Pager: [phone] After hours Pager: [phone]

## 2017-01-14 NOTE — NC FL2 (Signed)
[place] MEDICAID FL2 LEVEL OF CARE SCREENING TOOL     IDENTIFICATION  Patient Name: [name] [name] Birthdate: [birth year] Sex: female Admission Date (Current Location): 01/11/2017  [facility] and [place] Number:  Herbalist and Address:  [facility] [place], [place] [address], [place], [place] [address]      Provider Number: [id number]  Attending Physician Name and Address:  Rama, Venetia Maxon, MD  Relative Name and Phone Number:       Current Level of Care: Hospital Recommended Level of Care: [place] Prior Approval Number:    Date Approved/Denied:   PASRR Number: [id number]  Discharge Plan: SNF    Current Diagnoses: Patient Active Problem List   Diagnosis Date Noted  . Hyperglycemia 01/12/2017  . Pressure injury of skin 01/04/2017  . Goals of care, counseling/discussion   . Malnutrition of moderate degree 07/15/2016  . Ankle ulcer, right, limited to breakdown of skin ([place]) 07/12/2016  . Malignant neoplasm of lower-outer quadrant of right breast of female, estrogen receptor positive ([place]) 05/28/2016  . Chronic combined systolic and diastolic CHF (congestive heart failure) ([place]) 12/01/2015  . Acute encephalopathy 12/01/2015  . Elevated troponin 12/01/2015  . Hypoxia 01/27/2014  . Acute on chronic systolic CHF (congestive heart failure) ([place]) 01/27/2014  . Right foot ulcer ([place]) 01/27/2014  . Dysphagia 06/21/2012  . TIA (transient ischemic attack) 05/05/2012  . Weakness generalized 05/04/2012  . 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 Diabetes mellitus ([place])   . HTN (hypertension) 03/10/2011  . Cerebral infarction ([place]) 03/10/2011  . CAD (coronary artery disease), native coronary artery 03/10/2011  . 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 S/P PTCA (percutaneous transluminal coronary angioplasty) 03/10/2011    Orientation RESPIRATION BLADDER Height & Weight     Self,  Time, Situation, Place  Normal Incontinent, External catheter Weight: 199 lb 15.3 oz (90.7 kg) Height:  5\' 5"  (165.1 cm)  BEHAVIORAL SYMPTOMS/MOOD NEUROLOGICAL BOWEL NUTRITION STATUS  (None) (None) Incontinent Diet(DYS carb modified. Fluid nectar thick. Thin water allowed in between meals.)  AMBULATORY STATUS COMMUNICATION OF NEEDS Skin   Extensive Assist Non-Verbally Bruising, Other (Comment), PU Stage and Appropriate Care(Cracking. Open/dehisced wound/incision: Lower posterior right leg (Foam).)     PU Stage 3 Dressing: (Medial sacrum: Moist to dry every 3 days.)                 Personal Care Assistance Level of Assistance  Bathing, Feeding, Dressing Bathing Assistance: Limited assistance Feeding assistance: Limited assistance Dressing Assistance: Limited assistance     Functional Limitations Info  Sight, Hearing, Speech Sight Info: Adequate Hearing Info: Adequate Speech Info: Impaired(Nonverbal.)    SPECIAL CARE FACTORS FREQUENCY  PT (By licensed PT), OT (By licensed OT), Blood pressure, Speech therapy     PT Frequency: 5 x week OT Frequency: 5 x week     Speech Therapy Frequency: 5 x week      Contractures Contractures Info: Not present    Additional Factors Info  Code Status, Allergies Code Status Info: Full Allergies Info: Penicillins           Current Medications (01/14/2017):  This is the current hospital active medication list Current Facility-Administered Medications  Medication Dose Route Frequency Provider Last Rate Last Dose  . 0.9 %  sodium chloride infusion  250 mL Intravenous PRN Jani Gravel, MD      . acetaminophen (TYLENOL) suppository 650 mg  650 mg Rectal Q6H  PRN Jani Gravel, MD      . anastrozole (ARIMIDEX) tablet 1 mg  1 mg Oral Daily Rama, Venetia Maxon, MD   1 mg at 01/14/17 0916  . aspirin tablet 325 mg  325 mg Oral Daily Rama, Venetia Maxon, MD   325 mg at 01/14/17 0915  . bisacodyl (DULCOLAX) suppository 10 mg  10 mg Rectal PRN Jani Gravel,  MD      . carvedilol (COREG) tablet 3.125 mg  3.125 mg Oral BID WC Rama, Venetia Maxon, MD   3.125 mg at 01/13/17 1814  . cholecalciferol (VITAMIN D) tablet 1,000 Units  1,000 Units Oral Daily Rama, Venetia Maxon, MD   1,000 Units at 01/14/17 0916  . clopidogrel (PLAVIX) tablet 75 mg  75 mg Oral Daily Rama, Venetia Maxon, MD   75 mg at 01/14/17 0916  . enoxaparin (LOVENOX) injection 40 mg  40 mg Subcutaneous Q24H Jani Gravel, MD   40 mg at 01/12/17 1757  . escitalopram (LEXAPRO) tablet 20 mg  20 mg Oral Daily Rama, Venetia Maxon, MD   20 mg at 01/14/17 0916  . [START ON 01/15/2017] feeding supplement (GLUCERNA SHAKE) (GLUCERNA SHAKE) liquid 237 mL  237 mL Oral BID BM Rama, Christina P, MD      . furosemide (LASIX) injection 40 mg  40 mg Intravenous BID Rama, Venetia Maxon, MD   40 mg at 01/14/17 0915  . gabapentin (NEURONTIN) capsule 100 mg  100 mg Oral TID Rama, Venetia Maxon, MD   100 mg at 01/14/17 0916  . insulin aspart (novoLOG) injection 0-15 Units  0-15 Units Subcutaneous TID WC Rama, Venetia Maxon, MD   5 Units at 01/14/17 1253  . insulin aspart (novoLOG) injection 0-5 Units  0-5 Units Subcutaneous QHS Rama, Christina P, MD      . insulin aspart (novoLOG) injection 4 Units  4 Units Subcutaneous TID WC Rama, Venetia Maxon, MD   4 Units at 01/14/17 1254  . insulin glargine (LANTUS) injection 10 Units  10 Units Subcutaneous Pennelope Bracken, Venetia Maxon, MD   10 Units at 01/14/17 0914  . lisinopril (PRINIVIL,ZESTRIL) tablet 10 mg  10 mg Oral Daily Rama, Venetia Maxon, MD   10 mg at 01/14/17 0916  . MEDLINE mouth rinse  15 mL Mouth Rinse BID Jani Gravel, MD   15 mL at 01/14/17 0101  . multivitamin with minerals tablet 1 tablet  1 tablet Oral Daily Rama, Christina P, MD      . nitroGLYCERIN (NITROSTAT) SL tablet 0.4 mg  0.4 mg Sublingual Q5 min PRN Jani Gravel, MD      . RESOURCE THICKENUP CLEAR   Oral PRN Rama, Venetia Maxon, MD      . sodium chloride flush (NS) 0.9 % injection 3 mL  3 mL Intravenous Q12H Jani Gravel, MD    3 mL at 01/14/17 0932  . sodium chloride flush (NS) 0.9 % injection 3 mL  3 mL Intravenous PRN Jani Gravel, MD      . spironolactone (ALDACTONE) tablet 12.5 mg  12.5 mg Oral Daily Rama, Venetia Maxon, MD   12.5 mg at 01/14/17 8588     Discharge Medications: Please see discharge summary for a list of discharge medications.  Relevant Imaging Results:  Relevant Lab Results:   Additional Information SS#: [id number]  Candie Chroman, LCSW

## 2017-01-14 NOTE — Clinical Social Work Note (Signed)
Clinical Social Work Assessment  Patient Details  Name: [name] [name] MRN: [id number] Date of Birth: June 07, 1940  Date of referral:  01/14/17               Reason for consult:  Discharge Planning                Permission sought to share information with:  Facility Art therapist granted to share information::  Yes, Verbal Permission Granted  Name::        Agency::  [facility]  Relationship::     Contact Information:     Housing/Transportation Living arrangements for the past 2 months:  [place] of Information:  Patient, Medical Team Patient Interpreter Needed:  None Criminal Activity/Legal Involvement Pertinent to Current Situation/Hospitalization:  No - Comment as needed Significant Relationships:  Adult Children, Other Family Members Lives with:  Facility Resident, Other (Comment)(Grandsons) Do you feel safe going back to the place where you live?  Yes Need for family participation in patient care:  Yes (Comment)  Care giving concerns:  Patient is a short-term rehab resident from [facility].   Social Worker assessment / plan:  CSW met with patient. No supports at bedside. CSW introduced role and explained that discharge planning would be discussed. Patient confirmed she was admitted from [facility] and plans to return once stable for discharge. She had no questions for CSW to address. PT/OT evaluations pending. Insurance will need to review these for authorization approval. No further concerns. CSW encouraged patient to contact CSW as needed. CSW will continue to follow patient for support and facilitate discharge back to SNF once medically stable.  Employment status:  Retired Nurse, adult PT Recommendations:  Not assessed at this time Information / Referral to community resources:  [place]  Patient/Family's Response to care:  Patient agreeable to return to SNF. Patient's family  supportive and involved in patient's care. Patient appreciated social work intervention.  Patient/Family's Understanding of and Emotional Response to Diagnosis, Current Treatment, and Prognosis:  Patient has a good understanding of the reason for admission and her need to return to SNF once stable for discharge. Patient appears happy with hospital care.  Emotional Assessment Appearance:  Appears stated age Attitude/Demeanor/Rapport:  Other(Pleasant) Affect (typically observed):  Accepting, Appropriate, Calm, Pleasant Orientation:  Oriented to Self, Oriented to Place, Oriented to  Time, Oriented to Situation Alcohol / Substance use:  Never Used Psych involvement (Current and /or in the community):  No (Comment)  Discharge Needs  Concerns to be addressed:  Care Coordination Readmission within the last 30 days:  Yes Current discharge risk:  Dependent with Mobility Barriers to Discharge:  Continued Medical Work up, [place], LCSW 01/14/2017, 1:47 PM

## 2017-01-14 NOTE — Telephone Encounter (Signed)
Faxed records to [facility] [phone]

## 2017-01-14 NOTE — Clinical Social Work Note (Signed)
CSW sent clinicals so far to [facility] for authorization review for return to SNF.  [name] [name], [name]

## 2017-01-15 ENCOUNTER — Encounter: Payer: Self-pay | Admitting: General Practice

## 2017-01-15 ENCOUNTER — Other Ambulatory Visit: Payer: Self-pay

## 2017-01-15 LAB — GLUCOSE, CAPILLARY
GLUCOSE-CAPILLARY: 230 mg/dL — AB (ref 65–99)
Glucose-Capillary: 172 mg/dL — ABNORMAL HIGH (ref 65–99)
Glucose-Capillary: 318 mg/dL — ABNORMAL HIGH (ref 65–99)
Glucose-Capillary: 237 mg/dL — ABNORMAL HIGH (ref 65–99)

## 2017-01-15 LAB — BASIC METABOLIC PANEL
Anion gap: 8 (ref 5–15)
BUN: 12 mg/dL (ref 6–20)
CALCIUM: 9 mg/dL (ref 8.9–10.3)
CHLORIDE: 99 mmol/L — AB (ref 101–111)
CO2: 31 mmol/L (ref 22–32)
CREATININE: 0.86 mg/dL (ref 0.44–1.00)
GFR calc Af Amer: >60 mL/min (ref >60)
GFR calc non Af Amer: >60 mL/min (ref >60)
Glucose, Bld: 145 mg/dL — ABNORMAL HIGH (ref 65–99)
Potassium: 3.9 mmol/L (ref 3.5–5.1)
Sodium: 138 mmol/L (ref 135–145)

## 2017-01-15 MED ORDER — ADULT MULTIVITAMIN W/MINERALS CH: 1.0000 | ORAL_TABLET | Freq: Every day | ORAL | Status: AC

## 2017-01-15 MED ORDER — INSULIN ASPART 100 UNIT/ML ~~LOC~~ SOLN: 0.0000 [IU] | Freq: Three times a day (TID) | SUBCUTANEOUS | 11 refills | Status: AC

## 2017-01-15 MED ORDER — INSULIN ASPART 100 UNIT/ML ~~LOC~~ SOLN: 0.0000 [IU] | Freq: Every day | SUBCUTANEOUS | 11 refills | Status: AC

## 2017-01-15 MED ORDER — INSULIN GLARGINE 100 UNIT/ML ~~LOC~~ SOLN: 10.0000 [IU] | SUBCUTANEOUS | 11 refills | Status: DC

## 2017-01-15 MED ORDER — INSULIN ASPART 100 UNIT/ML ~~LOC~~ SOLN: 4.0000 [IU] | Freq: Three times a day (TID) | SUBCUTANEOUS | 11 refills | Status: DC

## 2017-01-15 NOTE — Progress Notes (Signed)
Second attempt to social work made, Education officer, museum stated that she will call SNF and call RN back.

## 2017-01-15 NOTE — Progress Notes (Signed)
CSW following to facilitate discharge. Insurance authorization was submitted to [facility] yesterday for authorization, but was not received yet; facility will not accept patient without authorization from insurance. Patient will not be able to transition today back to [facility].  CSW notified RN and MD. CSW will follow to facilitate discharge tomorrow.  Laveda Abbe, [place] Clinical Social Worker [phone]

## 2017-01-15 NOTE — Discharge Summary (Signed)
DISCHARGE SUMMARY  Eline Geng  MR#: [id number]  DOB:[birth year]  Date of Admission: 01/11/2017 Date of Discharge: 01/15/2017  Attending Physician:MCCLUNG,JEFFREY T  Patient's ZSW:FUXNATF, Jori Moll, MD  Consults:  none  Disposition: D/C to SNF (return to SNF)  Follow-up Appts: Follow-up Information    [facility] [place] Follow up.   Why:  Your ongoing care will be provided by the Doctor at your chosen SNF.            Tests Needing Follow-up: -monitor CBG as further adjustment of DM tx is likely to be required   Discharge Diagnoses: Diabetes mellitus / Hyperglycemia Elevated troponin in the setting of recent non-STEMI/V. 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  Incidentally a troponin was ordered and was noted to be 0.24 though she denied cp.    Hospital Course:  Elevated troponin in the setting of recent non-STEMI / VF arrest / chronic systolic and diastolic CHF / known CAD / hypertensive heart disease / hypertension Troponin mildly elevated but trend flat and the patient was asymptomatic. Medical therapy has been recommended in the past as the patient not felt to be a candidate for aggressive therapy/surgery. 12-lead EKG showed premature atrial complex, QTC 470 ms, and some nonspecific T waves but no frank ischemic changes. Continue aspirin, Plavix, beta blocker, lasix, aldactone, lisinopril, statin, and when necessary  nitroglycerin.  Transient acute hypoxic respiratory failure  Patient hypoxic overnight 11/20>11/21 with an oxygen saturation as low as 64%. CXR was w/o acute findings.  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 Stage 3 pressure injury: deep gluteal cleft proximal: 1.0cm x 0.3cm x 0.2cm  Full thickness, non pressure ulcer right posterior calf: 1.0cm x 1.0cm x 0.3cm. Continue wound care per wound care nurses recommendations:  Soft silicone foam for both wounds to protect and insulate.  Making sure that the foam in the gluteal cleft is folded and placed deep in the fold to allow the dressing surface to touch the wound.  Change every 3 days and PRN soilage.   History of breast cancer Continue anastrozole  Morbid obesity  Body mass index is 33.83 kg/m  Allergies as of 01/15/2017      Reactions   Penicillins Swelling   Swelling of mouth Has patient had a PCN reaction causing immediate rash, facial/tongue/throat swelling, SOB or lightheadedness with hypotension: YES Has patient had a PCN reaction causing severe rash involving mucus membranes or skin necrosis: NO  Has patient had a PCN reaction that required hospitalization NO Has patient had a PCN reaction occurring within the last 10 years: NO If all of the above answers are "NO", then may proceed with Cephalosporin use.       Medication List    STOP taking these medications   feeding supplement (GLUCERNA SHAKE) Liqd     TAKE these medications   anastrozole 1 MG tablet Commonly known as:  ARIMIDEX Take 1 tablet (1 mg total) by mouth daily.   aspirin 81 MG chewable tablet Chew 1 tablet (81 mg total) daily by mouth.   atorvastatin 40 MG tablet Commonly known as:  LIPITOR Take 1 tablet (40 mg total) daily at 6 PM by mouth.   bisacodyl 10 MG suppository Commonly known as:  DULCOLAX Place 10 mg as needed rectally for moderate constipation.   carvedilol 3.125 MG tablet Commonly known as:  COREG Take 1 tablet (3.125 mg total) 2 (two) times daily with a meal by mouth.   cholecalciferol 1000 units tablet Commonly known as:  VITAMIN D Take 1,000 Units by mouth daily.   clopidogrel 75 MG tablet Commonly known as:  PLAVIX Take 75 mg by mouth daily.   escitalopram 20 MG tablet Commonly known as:  LEXAPRO Take 20 mg by mouth daily.   furosemide 40 MG tablet Commonly known as:  LASIX Take 1 tablet (40 mg total) daily by mouth.   gabapentin 100 MG capsule Commonly known as:  NEURONTIN Take 1 capsule (100 mg total) by mouth 3 (three) times daily.   insulin aspart 100 UNIT/ML injection Commonly known as:  novoLOG Inject 0-15 Units into the skin 3 (three) times daily with meals. What changed:    how much to take  additional instructions   insulin aspart 100 UNIT/ML injection Commonly known as:  novoLOG Inject 0-5 Units into the skin at bedtime. What changed:  You were already taking a medication with the same name, and this prescription was added. Make sure you understand how and when to take each.   insulin aspart 100 UNIT/ML injection Commonly known as:  novoLOG Inject 4 Units into the skin 3 (three) times daily with meals. What changed:  You were already taking a medication with the same name, and this prescription was added. Make sure you understand how and when to take each.   insulin  glargine 100 UNIT/ML injection Commonly known as:  LANTUS Inject 0.1 mLs (10 Units total) into the skin every morning. Start taking on:  01/16/2017   lisinopril 10 MG tablet Commonly known as:  PRINIVIL,ZESTRIL Take 1 tablet (10 mg total) daily by mouth.   magnesium hydroxide 400 MG/5ML suspension Commonly known as:  MILK OF MAGNESIA Take 30 mLs daily as needed by mouth for mild constipation.   mouth rinse Liqd solution 15 mLs 2 (two) times daily for 15 days by Mouth Rinse route.   multivitamin with minerals Tabs tablet Take 1 tablet by mouth daily. Start taking on:  01/16/2017   nitroGLYCERIN 0.4 MG SL tablet Commonly known as:  NITROSTAT Place 0.4 mg under the tongue every 5 (five) minutes as needed for chest pain.   spironolactone 25 MG tablet Commonly known as:  ALDACTONE Take 0.5 tablets (12.5 mg total) daily by mouth.       Day of Discharge BP (!) 131/51 (BP Location: Left Arm)   Pulse (!) 59   Temp 98.2 F (36.8 C) (Oral)   Resp 18   Ht 5\' 5"  (1.651 m)  Wt 88.6 kg (195 lb 5.2 oz)   SpO2 100%   BMI 32.50 kg/m   Physical Exam: General: No acute respiratory distress Lungs: Clear to auscultation bilaterally without wheezes or crackles Cardiovascular: Regular rate and rhythm without murmur gallop or rub normal S1 and S2 Abdomen: Nontender, nondistended, soft, bowel sounds positive, no rebound, no ascites, no appreciable mass Extremities: No significant cyanosis, clubbing, or edema bilateral lower extremities  Basic Metabolic Panel: Recent Labs  Lab 01/11/17 2106 01/13/17 0611 01/15/17 0329  NA 136 137 138  K 4.7 4.0 3.9  CL 99* 102 99*  CO2 31 28 31   GLUCOSE 376* 220* 145*  BUN 8 7 12   CREATININE 0.90 0.77 0.86  CALCIUM 9.3 9.1 9.0    Liver Function Tests: Recent Labs  Lab 01/11/17 2106 01/13/17 0611  AST 16 13*  ALT 11* 11*  ALKPHOS 57 49  BILITOT 0.5 0.5  PROT 6.4* 5.5*  ALBUMIN 3.0* 2.7*    CBC: Recent Labs  Lab 01/11/17 2106  01/13/17 0611  WBC 5.7 5.1  NEUTROABS 4.0  --   HGB 10.3* 9.9*  HCT 33.9* 31.7*  MCV 86.7 87.1  PLT 224 237    Cardiac Enzymes: Recent Labs  Lab 01/11/17 2106 01/12/17 0100 01/12/17 0615 01/12/17 1315 01/12/17 1912  TROPONINI 0.24* 0.21* 0.23* 0.21* 0.23*   BNP (last 3 results) Recent Labs    07/12/16 1206 07/13/16 0340  BNP 810.5* 974.1*     CBG: Recent Labs  Lab 01/14/17 1122 01/14/17 1606 01/14/17 2038 01/15/17 0819 01/15/17 1149  GLUCAP 222* 300* 233* 172* 230*    Time spent in discharge (includes decision making & examination of pt): 35 minutes  01/15/2017, 2:08 PM   Cherene Altes, MD [facility] Office  [phone] Pager [phone]  On-Call/Text Page:      [email or web address]      password [facility]

## 2017-01-15 NOTE — Discharge Instructions (Signed)
Diabetes Mellitus and Standards of Medical Care Managing diabetes (diabetes mellitus) can be complicated. Your diabetes treatment may be managed by a team of health care providers, including:  A diet and nutrition specialist (registered dietitian).  A nurse.  A certified diabetes educator (CDE).  A diabetes specialist (endocrinologist).  An eye doctor.  A primary care provider.  A dentist.  Your health care providers follow a schedule in order to help you get the best quality of care. The following schedule is a general guideline for your diabetes management plan. Your health care providers may also give you more specific instructions. HbA1c ( hemoglobin A1c) test This test provides information about blood sugar (glucose) control over the previous 2-3 months. It is used to check whether your diabetes management plan needs to be adjusted.  If you are meeting your treatment goals, this test is done at least 2 times a year.  If you are not meeting treatment goals or if your treatment goals have changed, this test is done 4 times a year.  Blood pressure test  This test is done at every routine medical visit. For most people, the goal is less than 130/80. Ask your health care provider what your goal blood pressure should be. Dental and eye exams  Visit your dentist two times a year.  If you have type 1 diabetes, get an eye exam 3-5 years after you are diagnosed, and then once a year after your first exam. ? If you were diagnosed with type 1 diabetes as a child, get an eye exam when you are age 10 or older and have had diabetes for 3-5 years. After the first exam, you should get an eye exam once a year.  If you have type 2 diabetes, have an eye exam as soon as you are diagnosed, and then once a year after your first exam. Foot care exam  Visual foot exams are done at every routine medical visit. The exams check for cuts, bruises, redness, blisters, sores, or other problems with the  feet.  A complete foot exam is done by your health care provider once a year. This exam includes an inspection of the structure and skin of your feet, and a check of the pulses and sensation in your feet. ? Type 1 diabetes: Get your first exam 3-5 years after diagnosis. ? Type 2 diabetes: Get your first exam as soon as you are diagnosed.  Check your feet every day for cuts, bruises, redness, blisters, or sores. If you have any of these or other problems that are not healing, contact your health care provider. Kidney function test ( urine microalbumin)  This test is done once a year. ? Type 1 diabetes: Get your first test 5 years after diagnosis. ? Type 2 diabetes: Get your first test as soon as you are diagnosed.  If you have chronic kidney disease (CKD), get a serum creatinine and estimated glomerular filtration rate (eGFR) test once a year. Lipid profile (cholesterol, HDL, LDL, triglycerides)  This test should be done when you are diagnosed with diabetes, and every 5 years after the first test. If you are on medicines to lower your cholesterol, you may need to get this test done every year. ? The goal for LDL is less than 100 mg/dL (5.5 mmol/L). If you are at high risk, the goal is less than 70 mg/dL (3.9 mmol/L). ? The goal for HDL is 40 mg/dL (2.2 mmol/L) for men and 50 mg/dL(2.8 mmol/L) for women. An HDL   cholesterol of 60 mg/dL (3.3 mmol/L) or higher gives some protection against heart disease. ? The goal for triglycerides is less than 150 mg/dL (8.3 mmol/L). Immunizations  The yearly flu (influenza) vaccine is recommended for everyone 6 months or older who has diabetes.  The pneumonia (pneumococcal) vaccine is recommended for everyone 2 years or older who has diabetes. If you are 65 or older, you may get the pneumonia vaccine as a series of two separate shots.  The hepatitis B vaccine is recommended for adults shortly after they have been diagnosed with diabetes.  The Tdap  (tetanus, diphtheria, and pertussis) vaccine should be given: ? According to normal childhood vaccination schedules, for children. ? Every 10 years, for adults who have diabetes.  The shingles vaccine is recommended for people who have had chicken pox and are 50 years or older. Mental and emotional health  Screening for symptoms of eating disorders, anxiety, and depression is recommended at the time of diagnosis and afterward as needed. If your screening shows that you have symptoms (you have a positive screening result), you may need further evaluation and be referred to a mental health care provider. Diabetes self-management education  Education about how to manage your diabetes is recommended at diagnosis and ongoing as needed. Treatment plan  Your treatment plan will be reviewed at every medical visit. Summary  Managing diabetes (diabetes mellitus) can be complicated. Your diabetes treatment may be managed by a team of health care providers.  Your health care providers follow a schedule in order to help you get the best quality of care.  Standards of care including having regular physical exams, blood tests, blood pressure monitoring, immunizations, screening tests, and education about how to manage your diabetes.  Your health care providers may also give you more specific instructions based on your individual health. This information is not intended to replace advice given to you by your health care provider. Make sure you discuss any questions you have with your health care provider. Document Released: 12/08/2008 Document Revised: 11/09/2015 Document Reviewed: 11/09/2015 Elsevier Interactive Patient Education  2018 [employer] Inc.  

## 2017-01-15 NOTE — Progress Notes (Signed)
SW called in regards to discharge order. Pt is to be discharged to SNF. No answer, left a message. Awaiting call back.

## 2017-01-16 LAB — GLUCOSE, CAPILLARY
GLUCOSE-CAPILLARY: 237 mg/dL — AB (ref 65–99)
GLUCOSE-CAPILLARY: 153 mg/dL — AB (ref 65–99)
Glucose-Capillary: 345 mg/dL — ABNORMAL HIGH (ref 65–99)
Glucose-Capillary: 247 mg/dL — ABNORMAL HIGH (ref 65–99)

## 2017-01-16 MED ORDER — INSULIN ASPART 100 UNIT/ML ~~LOC~~ SOLN
0.0000 [IU] | Freq: Three times a day (TID) | SUBCUTANEOUS | Status: DC
Start: 2017-01-17 — End: 2017-01-17
  Administered 2017-01-17: 3 [IU] via SUBCUTANEOUS
  Administered 2017-01-17: 15 [IU] via SUBCUTANEOUS

## 2017-01-16 MED ORDER — INSULIN GLARGINE 100 UNIT/ML ~~LOC~~ SOLN
6.0000 [IU] | Freq: Once | SUBCUTANEOUS | Status: AC
  Administered 2017-01-16: 6 [IU] via SUBCUTANEOUS
  Filled 2017-01-16: qty 0.06

## 2017-01-16 MED ORDER — INSULIN GLARGINE 100 UNIT/ML ~~LOC~~ SOLN
20.0000 [IU] | Freq: Every day | SUBCUTANEOUS | Status: DC
  Administered 2017-01-16: 20 [IU] via SUBCUTANEOUS
  Filled 2017-01-16: qty 0.2

## 2017-01-16 MED ORDER — FUROSEMIDE 40 MG PO TABS
40.0000 mg | ORAL_TABLET | Freq: Every day | ORAL | Status: DC
  Administered 2017-01-17: 40 mg via ORAL
  Filled 2017-01-16 (×2): qty 1

## 2017-01-16 MED ORDER — ACETAMINOPHEN 325 MG PO TABS: 650.0000 mg | ORAL_TABLET | Freq: Four times a day (QID) | ORAL | Status: DC | PRN

## 2017-01-16 MED ORDER — CARVEDILOL 3.125 MG PO TABS
3.1250 mg | ORAL_TABLET | Freq: Two times a day (BID) | ORAL | Status: DC
  Administered 2017-01-17: 3.125 mg via ORAL
  Filled 2017-01-16: qty 1

## 2017-01-16 MED ORDER — INSULIN ASPART 100 UNIT/ML ~~LOC~~ SOLN
6.0000 [IU] | Freq: Three times a day (TID) | SUBCUTANEOUS | Status: DC
  Administered 2017-01-17 (×2): 6 [IU] via SUBCUTANEOUS

## 2017-01-16 MED ORDER — INSULIN ASPART 100 UNIT/ML ~~LOC~~ SOLN
5.0000 [IU] | Freq: Once | SUBCUTANEOUS | Status: AC
  Administered 2017-01-16: 5 [IU] via SUBCUTANEOUS

## 2017-01-16 MED ORDER — INSULIN ASPART 100 UNIT/ML ~~LOC~~ SOLN
6.0000 [IU] | Freq: Three times a day (TID) | SUBCUTANEOUS | Status: DC
  Administered 2017-01-16: 6 [IU] via SUBCUTANEOUS

## 2017-01-16 MED ORDER — INSULIN GLARGINE 100 UNIT/ML ~~LOC~~ SOLN: 18.0000 [IU] | SUBCUTANEOUS | Status: DC

## 2017-01-16 NOTE — Progress Notes (Signed)
Inpatient Diabetes Program Recommendations  AACE/ADA: New Consensus Statement on Inpatient Glycemic Control (2015)  Target Ranges:  Prepandial:   less than 140 mg/dL      Peak postprandial:   less than 180 mg/dL (1-2 hours)      Critically ill patients:  140 - 180 mg/dL   Lab Results  Component Value Date   GLUCAP 237 (H) 01/16/2017   HGBA1C 9.4 (H) 12/29/2016    Review of Glycemic ControlResults for ITZELLE, GAINS (MRN [id number]) as of 01/16/2017 09:48  Ref. Range 01/15/2017 08:19 01/15/2017 11:49 01/15/2017 17:10 01/15/2017 22:12 01/16/2017 08:08  Glucose-Capillary Latest Ref Range: 65 - 99 mg/dL 172 (H) 230 (H) 318 (H) 237 (H) 237 (H)   Inpatient Diabetes Program Recommendations:    Note plans for patient to d/c back to SNF.  Fasting CBG=237 mg/dL. Please consider increasing Lantus to 15 units daily.   Thanks, Adah Perl, RN, BC-ADM Inpatient Diabetes Coordinator Pager [phone]

## 2017-01-16 NOTE — Progress Notes (Signed)
[facility] TEAM 1 - Stepdown/ICU TEAM  [name] [name]  CWC:[id number] DOB: [birth year] DOA: 01/11/2017 PCP: [name], MD    Brief Narrative:  76 y.o. female w/ a hx of HTN, HLD, DM2, PVD, CVA w/ aphasia, CAD s/p CABG, CHF (EF 25-30%), Moderate Aortic Stenosis, and an admission on 12/29/2016 for VFib arrest w/ a NSTEMI  who presented to ED due to hyperglycemia.  Incidentally a troponin was ordered and was noted to be 0.24 though she denied cp.    Subjective: Resting comfortably in bed.  Looks good.  No new complaints.  Discusses with me via written messages her home insulin regimen.  Is anxious to return to [facility] as soon as possible.  Assessment & Plan:  Elevated troponinin the setting of recent non-STEMI / VF arrest / chronic systolic and diastolicCHF / known CAD / hypertensive heart disease / hypertension Troponin mildly elevated but trend flat and the patient was asymptomatic. Medical therapy has been recommended in the past as the patient not felt to be a candidate for aggressive therapy/surgery.12-lead EKG showed premature atrial complex, QTC 470 ms, and some nonspecific T waves but no frank ischemic changes.Continue aspirin, Plavix, beta blocker, lasix, aldactone, lisinopril, statin, and when necessary nitroglycerin.  Transient acute hypoxic respiratory failure  Patient hypoxic overnight 11/20 >11/21 with an oxygen saturation as low as 64%. CXR was w/o acute findings.  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 Continue wound care per wound care nurses recommendations:  Soft silicone foam for both wounds to protect and insulate. Making sure that the foam in the gluteal cleft is folded and placed deep in the fold to allow the dressing surface to touch the wound.  Change every 3 days and PRN soilage.   History of breast cancer Continue anastrozole  Morbid obesity  Body mass index is 33.83 kg/m   DVT prophylaxis: lovenox Code Status: FULL CODE Family Communication: no family present at time of exam  Disposition Plan: Waiting on insurance authorization to allow discharge back to SNF  Consultants:  none  Procedures: none  Antimicrobials:  none  Objective: Blood pressure (!) 112/48, pulse 62, temperature 98 F (36.7 C), temperature source Oral, resp. rate 16, height 5\' 5"  (1.651 m), weight 87.8 kg (193 lb 9 oz), SpO2 98 %.  Intake/Output Summary (Last 24 hours) at 01/16/2017 1117 Last data filed at 01/16/2017 0906 Gross per 24 hour  Intake 663 ml  Output 2525 ml  Net -1862 ml   Filed Weights   01/14/17 0449 01/15/17 0329 01/16/17 0518  Weight: 90.7 kg (199 lb 15.3 oz) 88.6 kg (195 lb 5.2 oz) 87.8 kg (193 lb 9 oz)    Examination: General: No acute respiratory distress Lungs: Clear to  auscultation bilaterally without wheezes or crackles Cardiovascular: Regular rate and rhythm without murmur gallop or rub normal S1 and S2 Abdomen: Nontender, nondistended, soft, bowel sounds positive, no rebound, no ascites, no appreciable mass Extremities: No significant cyanosis, clubbing, or edema bilateral lower extremities   CBC: Recent Labs  Lab  01/11/17 2106 01/13/17 0611  WBC 5.7 5.1  NEUTROABS 4.0  --   HGB 10.3* 9.9*  HCT 33.9* 31.7*  MCV 86.7 87.1  PLT 224 202   Basic Metabolic Panel: Recent Labs  Lab 01/11/17 2106 01/13/17 0611 01/15/17 0329  NA 136 137 138  K 4.7 4.0 3.9  CL 99* 102 99*  CO2 31 28 31   GLUCOSE 376* 220* 145*  BUN 8 7 12   CREATININE 0.90 0.77 0.86  CALCIUM 9.3 9.1 9.0   GFR: Estimated Creatinine Clearance: 61.8 mL/min (by C-G formula based on SCr of 0.86 mg/dL).  Liver Function Tests: Recent Labs  Lab 01/11/17 2106 01/13/17 0611  AST 16 13*  ALT 11* 11*  ALKPHOS 57 49  BILITOT 0.5 0.5  PROT 6.4* 5.5*  ALBUMIN 3.0* 2.7*   Cardiac Enzymes: Recent Labs  Lab 01/11/17 2106 01/12/17 0100 01/12/17 0615 01/12/17 1315 01/12/17 1912  TROPONINI 0.24* 0.21* 0.23* 0.21* 0.23*    HbA1C: Hgb A1c MFr Bld  Date/Time Value Ref Range Status  12/29/2016 12:45 PM 9.4 (H) 4.8 - 5.6 % Final    Comment:    (NOTE) Pre diabetes:          5.7%-6.4% Diabetes:              >6.4% Glycemic control for   <7.0% adults with diabetes   07/14/2016 03:48 AM 7.5 (H) 4.8 - 5.6 % Final    Comment:    (NOTE)         Pre-diabetes: 5.7 - 6.4         Diabetes: >6.4         Glycemic control for adults with diabetes: <7.0     CBG: Recent Labs  Lab 01/15/17 1149 01/15/17 1710 01/15/17 2212 01/16/17 0808 01/16/17 1114  GLUCAP 230* 318* 237* 237* 345*    Scheduled Meds: . anastrozole  1 mg Oral Daily  . aspirin  325 mg Oral Daily  . carvedilol  3.125 mg Oral BID WC  . cholecalciferol  1,000 Units Oral Daily  . clopidogrel  75 mg Oral Daily  . enoxaparin (LOVENOX) injection  40 mg Subcutaneous Q24H  . escitalopram  20 mg Oral Daily  . feeding supplement (GLUCERNA SHAKE)  237 mL Oral BID BM  . furosemide  40 mg Intravenous BID  . gabapentin  100 mg Oral TID  . insulin aspart  0-15 Units Subcutaneous TID WC  . insulin aspart  0-5 Units Subcutaneous QHS  . insulin aspart  4 Units Subcutaneous  TID WC  . insulin glargine  10 Units Subcutaneous [id number]  . lisinopril  10 mg Oral Daily  . mouth rinse  15 mL Mouth Rinse BID  . multivitamin with minerals  1 tablet Oral Daily  . sodium chloride flush  3 mL Intravenous Q12H  . spironolactone  12.5 mg Oral Daily     LOS: 4 days    Cherene Altes, MD [facility] Office  [phone] Pager - Text Page per Amion as per below:  On-Call/Text Page:      [email or web address]      password [email or web address]  If 7PM-7AM, please contact night-coverage [email or web address] Password [email or web address] 01/16/2017, 11:17 AM

## 2017-01-16 NOTE — Evaluation (Signed)
Occupational Therapy Evaluation Patient Details Name: [name] [name] MRN: [id number] DOB: Nov 12, 1940 Today's Date: 01/16/2017    History of Present Illness Pt adm with elevated troponin in setting of recent MI. PMH - CVA with residual rt weakness, htn, pvd, mi, htn, breast CA, chf, dm   Clinical Impression   Pt admitted with above, and demonstrates the below listed deficits.  She was at [facility]F for rehab with plans to return there upon discharge.  She currently requires mod A- total A for ADLs and mod - max a for functional transfers.  All further OT needs can be addressed at SNF.  Acute OT will sign off at this time.     Follow Up Recommendations  SNF;Supervision/Assistance - 24 hour    Equipment Recommendations  None recommended by OT    Recommendations for Other Services       Precautions / Restrictions Precautions Precautions: Fall      Mobility Bed Mobility Overal bed mobility: Needs Assistance Bed Mobility: Supine to Sit;Sit to Supine     Supine to sit: Min assist;HOB elevated   Sit to sidelying: Mod assist General bed mobility comments: assist to lift trunk and assist to lift LEs   Transfers Overall transfer level: Needs assistance   Transfers: Sit to/from Stand Sit to Stand: Mod assist;Max assist         General transfer comment: pt stood x 1 with mod A, and  x 1 with max A.  assist provided for forward translation of trunk and assist to extend into standing     Balance Overall balance assessment: Needs assistance Sitting-balance support: Feet supported;No upper extremity supported Sitting balance-Leahy Scale: Fair Sitting balance - Comments: Pt able to reach down to ankle to adjust sock while sitting EOB with min guard assist    Standing balance support: Single extremity supported Standing balance-Leahy Scale: Poor Standing balance comment: requiers mod A- progressing to max A                             ADL either performed or  assessed with clinical judgement   ADL Overall ADL's : Needs assistance/impaired Eating/Feeding: Moderate assistance;Bed level   Grooming: Wash/dry hands;Wash/dry face;Oral care;Minimal assistance;Sitting   Upper Body Bathing: Moderate assistance;Sitting   Lower Body Bathing: Total assistance;Sit to/from stand   Upper Body Dressing : Maximal assistance;Sitting   Lower Body Dressing: Total assistance;Sit to/from stand   Toilet Transfer: Maximal assistance;Squat-pivot;BSC   Toileting- Clothing Manipulation and Hygiene: Total assistance;Sit to/from stand       Functional mobility during ADLs: Moderate assistance;Maximal assistance       Vision   Additional Comments: difficult to assess      Perception Perception Perception Tested?: Yes   Praxis      Pertinent Vitals/Pain Pain Assessment: No/denies pain     Hand Dominance Left   Extremity/Trunk Assessment Upper Extremity Assessment Upper Extremity Assessment: RUE deficits/detail RUE Deficits / Details: Pt demonstrates movement in [place] stage 2, beginning stage 3 UE and hand.  Flexor spasticity present  RUE Coordination: decreased fine motor;decreased gross motor   Lower Extremity Assessment Lower Extremity Assessment: Defer to PT evaluation   Cervical / Trunk Assessment Cervical / Trunk Assessment: Other exceptions Cervical / Trunk Exceptions: decreased active trunk on Rt    Communication Communication Communication: Expressive difficulties;Other (comment)   Cognition Arousal/Alertness: Awake/alert Behavior During Therapy: WFL for tasks assessed/performed Overall Cognitive Status: Difficult to assess  General Comments: Appears functional   General Comments       Exercises     Shoulder Instructions      Home Living Family/patient expects to be discharged to:: Skilled nursing facility                                 Additional Comments:  Pt has been at [facility]       Prior Functioning/Environment Level of Independence: Needs assistance  Gait / Transfers Assistance Needed: During recent adm pt +2 max assist for transfers ADL's / Homemaking Assistance Needed: Pt indicates the staff at SNF assist her with ADLs  Communication / Swallowing Assistance Needed: expressive aphasia, communicates with writing or head shakes (yes/no)          OT Problem List: Decreased strength;Decreased range of motion;Decreased activity tolerance;Impaired balance (sitting and/or standing);Decreased coordination;Decreased safety awareness;Impaired tone;Impaired UE functional use      OT Treatment/Interventions:      OT Goals(Current goals can be found in the care plan section) Acute Rehab OT Goals Patient Stated Goal: Pt unable to state  OT Frequency:     Barriers to D/C: Decreased caregiver support          Co-evaluation              AM-PAC PT "6 Clicks" Daily Activity     Outcome Measure Help from another person eating meals?: A Lot Help from another person taking care of personal grooming?: A Lot Help from another person toileting, which includes using toliet, bedpan, or urinal?: A Lot Help from another person bathing (including washing, rinsing, drying)?: A Lot Help from another person to put on and taking off regular upper body clothing?: A Lot Help from another person to put on and taking off regular lower body clothing?: Total 6 Click Score: 11   End of Session Nurse Communication: Mobility status  Activity Tolerance: Patient tolerated treatment well Patient left: in bed;with call bell/phone within reach;with bed alarm set  OT Visit Diagnosis: Unsteadiness on feet (R26.81);Hemiplegia and hemiparesis Hemiplegia - Right/Left: Right Hemiplegia - dominant/non-dominant: Non-Dominant Hemiplegia - caused by: Cerebral infarction                Time: 0301-3143 OT Time Calculation (min): 15 min Charges:  OT General  Charges $OT Visit: 1 Visit OT Evaluation $OT Eval Moderate Complexity: 1 Mod G-Codes:     [facility], OTR/L [phone]   Lucille Passy M 01/16/2017, 4:57 PM

## 2017-01-16 NOTE — Plan of Care (Signed)
  Progressing Education: Knowledge of General Education information will improve Description General education material given and explained to patient. Pt. Non-verbal. Nods head for understanding.  01/16/2017 0305 - Progressing by Ruben Im, RN Clinical Measurements: Will remain free from infection 01/16/2017 0305 - Progressing by Ruben Im, RN Diagnostic test results will improve 01/16/2017 0305 - Progressing by Ruben Im, RN Respiratory complications will improve 01/16/2017 0305 - Progressing by Ruben Im, RN Cardiovascular complication will be avoided 01/16/2017 0305 - Progressing by Ruben Im, RN Nutrition: Adequate nutrition will be maintained 01/16/2017 0305 - Progressing by Ruben Im, RN Pain Managment: General experience of comfort will improve 01/16/2017 0305 - Progressing by Ruben Im, RN Safety: Ability to remain free from injury will improve 01/16/2017 0305 - Progressing by Ruben Im, RN Skin Integrity: Risk for impaired skin integrity will decrease 01/16/2017 0305 - Progressing by Ruben Im, RN

## 2017-01-17 LAB — GLUCOSE, CAPILLARY
GLUCOSE-CAPILLARY: 382 mg/dL — AB (ref 65–99)
Glucose-Capillary: 173 mg/dL — ABNORMAL HIGH (ref 65–99)

## 2017-01-17 MED ORDER — FLUTICASONE PROPIONATE 50 MCG/ACT NA SUSP
2.0000 | Freq: Every day | NASAL | Status: DC
Start: 2017-01-17 — End: 2017-01-17

## 2017-01-17 MED ORDER — INSULIN ASPART 100 UNIT/ML ~~LOC~~ SOLN: 6.0000 [IU] | Freq: Three times a day (TID) | SUBCUTANEOUS | 11 refills | Status: AC

## 2017-01-17 MED ORDER — GABAPENTIN 100 MG PO CAPS: 100.0000 mg | ORAL_CAPSULE | Freq: Three times a day (TID) | ORAL | 0 refills | Status: AC

## 2017-01-17 MED ORDER — ACETAMINOPHEN 325 MG PO TABS: 650.0000 mg | ORAL_TABLET | Freq: Four times a day (QID) | ORAL | Status: AC | PRN

## 2017-01-17 MED ORDER — INSULIN GLARGINE 100 UNIT/ML ~~LOC~~ SOLN: 20.0000 [IU] | Freq: Every day | SUBCUTANEOUS | 11 refills | Status: AC

## 2017-01-17 NOTE — Discharge Summary (Signed)
DISCHARGE SUMMARY  [name] [name]  MR#: [id number]  DOB:11-May-1940  Date of Admission: 01/11/2017 Date of Discharge: 01/17/2017  Attending Physician:Amellia Panik T  Patient's WCB:[id number], Jori Moll, MD  Consults:  none  Disposition: D/C to SNF (return to SNF)  Follow-up Appts: Follow-up Information    The Doctor at your [facility] Follow up.   Why:  Your ongoing care will be provided by the Doctor at your chosen SNF.            Tests Needing Follow-up: -monitor CBG as further adjustment of DM tx is likely to be required  -ongoing wound care will be required for her wounds as detailed below   Modified Diet: dysphagia 2 (fine chop) and nectar thick liquids, w/ thin water OK BETWEEN meals   Discharge Diagnoses: Diabetes mellitus / Hyperglycemia Elevated troponin in the setting of recent non-STEMI/V. 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  Incidentally a troponin was ordered and was noted to be 0.24 though she denied cp.    Hospital Course:  The patient's hospital course was extended from 11/22 to 11/24 while insurance authorization for her return to her SNF was sought.  During this period she remained clinically stable.  This time was utilized to further titrate her DM meds, w/ CBG better controlled at time of actual D/C.   Elevated troponin in the setting of recent non-STEMI / VF arrest / chronic systolic and diastolic CHF / known CAD /  hypertensive heart disease / hypertension Troponin mildly elevated but trend flat and the patient was asymptomatic. Medical therapy has been recommended in the past as the patient not felt to be a candidate for aggressive therapy/surgery. 12-lead EKG showed premature atrial complex, QTC 470 ms, and some nonspecific T waves but no frank ischemic changes. Continue aspirin, Plavix, beta blocker, lasix, aldactone, lisinopril, statin, and when necessary nitroglycerin.  Transient acute hypoxic respiratory failure  Patient hypoxic overnight 11/20>11/21 with an oxygen saturation as low as 64%. CXR was w/o acute findings.  Sx rapidly improved w/o further intervention - pt on RA at time of d/c and sats 100%  Uncontrolled Diabetes mellitus / Severe Hyperglycemia Hemoglobin A1c 9.4% on 12/29/16 - CBG has been quite labile during the later portion of the patient's hospital stay - she will remain on lantus + meal coverage + SSI at time of d/c - it is anticipated that further titration of her insulin regimen will be required, but this should be well within the capabilities of a SNF   History of stroke with dysphasia and right hemiparesis Status post speech therapy evaluation w/ diet advanced to dysphagia 2 (fine chop) and nectar thick liquids, w/ thin water OK BETWEEN meals - meds should be given crushed in nectar thick liquid (NOT in applesauce)  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 Stage 3 pressure injury: deep gluteal cleft proximal: 1.0cm x 0.3cm x 0.2cm  Full thickness, non pressure ulcer right posterior calf: 1.0cm x 1.0cm x 0.3cm. Continue wound care  per wound care nurses recommendations:  Soft silicone foam for both wounds to protect and insulate.  Making sure that the foam in the gluteal cleft is folded and placed deep in the fold to allow the dressing surface to touch the wound.  Change every 3 days and PRN soilage.    History of breast cancer Continue anastrozole  Morbid obesity  Body mass index is 33.83 kg/m  Allergies as of 01/17/2017      Reactions   Penicillins Swelling   Swelling of mouth Has patient had a PCN reaction causing immediate rash, facial/tongue/throat swelling, SOB or lightheadedness with hypotension: YES Has patient had a PCN reaction causing severe rash involving mucus membranes or skin necrosis: NO Has patient had a PCN reaction that required hospitalization NO Has patient had a PCN reaction occurring within the last 10 years: NO If all of the above answers are "NO", then may proceed with Cephalosporin use.      Medication List    STOP taking these medications   feeding supplement (GLUCERNA SHAKE) Liqd     TAKE these medications   acetaminophen 325 MG tablet Commonly known as:  TYLENOL Take 2 tablets (650 mg total) by mouth every 6 (six) hours as needed for mild pain, fever or headache.   anastrozole 1 MG tablet Commonly known as:  ARIMIDEX Take 1 tablet (1 mg total) by mouth daily.   aspirin 81 MG chewable tablet Chew 1 tablet (81 mg total) daily by mouth.   atorvastatin 40 MG tablet Commonly known as:  LIPITOR Take 1 tablet (40 mg total) daily at 6 PM by mouth.   bisacodyl 10 MG suppository Commonly known as:  DULCOLAX Place 10 mg as needed rectally for moderate constipation.   carvedilol 3.125 MG tablet Commonly known as:  COREG Take 1 tablet (3.125 mg total) 2 (two) times daily with a meal by mouth.   cholecalciferol 1000 units tablet Commonly known as:  VITAMIN D Take 1,000 Units by mouth daily.   clopidogrel 75 MG tablet Commonly known as:  PLAVIX Take 75 mg by mouth daily.   escitalopram 20 MG tablet Commonly known as:  LEXAPRO Take 20 mg by mouth daily.   furosemide 40 MG tablet Commonly known as:  LASIX Take 1 tablet (40 mg total) daily by mouth.   gabapentin 100 MG capsule Commonly known as:  NEURONTIN Take 1 capsule (100 mg total)  by mouth 3 (three) times daily.   insulin aspart 100 UNIT/ML injection Commonly known as:  novoLOG Inject 0-15 Units into the skin 3 (three) times daily with meals. What changed:    how much to take  additional instructions   insulin aspart 100 UNIT/ML injection Commonly known as:  novoLOG Inject 0-5 Units into the skin at bedtime. What changed:  You were already taking a medication with the same name, and this prescription was added. Make sure you understand how and when to take each.   insulin aspart 100 UNIT/ML injection Commonly known as:  novoLOG Inject 6 Units into the skin 3 (three) times daily with meals. What changed:  You were already taking a medication with the same name, and this prescription was added. Make sure you understand how and when to take each.   insulin glargine 100 UNIT/ML injection Commonly known as:  LANTUS Inject 0.2 mLs (20 Units total) into the skin at bedtime.   lisinopril 10 MG tablet Commonly known as:  PRINIVIL,ZESTRIL Take 1 tablet (10 mg total) daily by mouth.  magnesium hydroxide 400 MG/5ML suspension Commonly known as:  MILK OF MAGNESIA Take 30 mLs daily as needed by mouth for mild constipation.   mouth rinse Liqd solution 15 mLs 2 (two) times daily for 15 days by Mouth Rinse route.   multivitamin with minerals Tabs tablet Take 1 tablet by mouth daily.   nitroGLYCERIN 0.4 MG SL tablet Commonly known as:  NITROSTAT Place 0.4 mg under the tongue every 5 (five) minutes as needed for chest pain.   spironolactone 25 MG tablet Commonly known as:  ALDACTONE Take 0.5 tablets (12.5 mg total) daily by mouth.       Day of Discharge BP (!) 97/49 (BP Location: Left Arm)   Pulse 60   Temp 98.1 F (36.7 C) (Oral)   Resp 18   Ht 5\' 5"  (1.651 m)   Wt 86.4 kg (190 lb 7.6 oz)   SpO2 100%   BMI 31.70 kg/m   Physical Exam: General: No acute respiratory distress Lungs: CTA B w/o wheeze or crackles  Cardiovascular: Regular rate and  rhythm without murmur gallop or rub normal S1 and S2 Abdomen: Nontender, nondistended, soft, bowel sounds positive, no rebound, no ascites, no appreciable mass Extremities: No significant cyanosis, clubbing, or edema bilateral lower extremities  Basic Metabolic Panel: Recent Labs  Lab 01/11/17 2106 01/13/17 0611 01/15/17 0329  NA 136 137 138  K 4.7 4.0 3.9  CL 99* 102 99*  CO2 31 28 31   GLUCOSE 376* 220* 145*  BUN 8 7 12   CREATININE 0.90 0.77 0.86  CALCIUM 9.3 9.1 9.0    Liver Function Tests: Recent Labs  Lab 01/11/17 2106 01/13/17 0611  AST 16 13*  ALT 11* 11*  ALKPHOS 57 49  BILITOT 0.5 0.5  PROT 6.4* 5.5*  ALBUMIN 3.0* 2.7*    CBC: Recent Labs  Lab 01/11/17 2106 01/13/17 0611  WBC 5.7 5.1  NEUTROABS 4.0  --   HGB 10.3* 9.9*  HCT 33.9* 31.7*  MCV 86.7 87.1  PLT 224 237    Cardiac Enzymes: Recent Labs  Lab 01/11/17 2106 01/12/17 0100 01/12/17 0615 01/12/17 1315 01/12/17 1912  TROPONINI 0.24* 0.21* 0.23* 0.21* 0.23*   BNP (last 3 results) Recent Labs    07/12/16 1206 07/13/16 0340  BNP 810.5* 974.1*     CBG: Recent Labs  Lab 01/16/17 1114 01/16/17 1621 01/16/17 2138 01/17/17 0710 01/17/17 1057  GLUCAP 345* 153* 247* 173* 382*    Time spent in discharge (includes decision making & examination of pt): 35 minutes  01/17/2017, 11:12 AM   Cherene Altes, MD [facility] Office  [phone] Pager [phone]  On-Call/Text Page:      [email or web address]      password [email or web address]

## 2017-01-17 NOTE — Progress Notes (Signed)
CSW confirmed with [facility] [employer] authorization was received from weekday CSW. Patient is set to DC to [facility] today- please call report to [phone] and patient will be going to room 31238.   Patient & family aware. Discharge packet given to RN. PTAR scheduled for 1PM.   Kingsley Spittle, [place] Worker [phone]

## 2017-01-26 ENCOUNTER — Telehealth: Payer: Self-pay | Admitting: Oncology

## 2017-01-26 NOTE — Telephone Encounter (Signed)
01/26/2017 faxed medical records to [facility] @ [phone] successfully @ 6:25 am

## 2017-03-26 ENCOUNTER — Telehealth: Payer: Self-pay | Admitting: *Deleted

## 2017-04-02 ENCOUNTER — Other Ambulatory Visit: Payer: Self-pay

## 2017-04-02 ENCOUNTER — Telehealth: Payer: Self-pay

## 2017-04-02 DIAGNOSIS — Z170 Estrogen receptor positive status [ER+]: Principal | ICD-10-CM

## 2017-04-02 DIAGNOSIS — C50511 Malignant neoplasm of lower-outer quadrant of right female breast: Secondary | ICD-10-CM

## 2017-04-02 NOTE — Telephone Encounter (Signed)
Returned pt daughter call, informed her I placed order for pt mammogram and they can [facility] to schedule.  Cyndia Bent RN

## 2017-04-02 NOTE — Progress Notes (Signed)
Mammogram orders placed.

## 2017-04-13 ENCOUNTER — Other Ambulatory Visit: Payer: Self-pay | Admitting: Oncology

## 2017-04-13 DIAGNOSIS — C50511 Malignant neoplasm of lower-outer quadrant of right female breast: Secondary | ICD-10-CM

## 2017-04-13 DIAGNOSIS — Z170 Estrogen receptor positive status [ER+]: Principal | ICD-10-CM

## 2017-04-17 ENCOUNTER — Other Ambulatory Visit: Payer: Medicare HMO

## 2017-04-22 NOTE — Telephone Encounter (Signed)
No entry 

## 2017-04-24 ENCOUNTER — Ambulatory Visit
Admission: RE | Admit: 2017-04-24 | Discharge: 2017-04-24 | Disposition: A | Discharge status: Home / Self Care | Payer: Medicare HMO | Source: Ambulatory Visit | Attending: Oncology | Admitting: Oncology

## 2017-04-24 DIAGNOSIS — C50511 Malignant neoplasm of lower-outer quadrant of right female breast: Secondary | ICD-10-CM

## 2017-04-24 DIAGNOSIS — Z170 Estrogen receptor positive status [ER+]: Principal | ICD-10-CM

## 2017-04-27 ENCOUNTER — Other Ambulatory Visit: Payer: Self-pay | Admitting: Oncology

## 2017-04-28 ENCOUNTER — Telehealth: Payer: Self-pay | Admitting: Oncology

## 2017-04-28 NOTE — Telephone Encounter (Signed)
Called regarding 3/8

## 2017-04-30 ENCOUNTER — Other Ambulatory Visit: Payer: Self-pay | Admitting: Adult Health

## 2017-04-30 DIAGNOSIS — Z170 Estrogen receptor positive status [ER+]: Principal | ICD-10-CM

## 2017-04-30 DIAGNOSIS — C50511 Malignant neoplasm of lower-outer quadrant of right female breast: Secondary | ICD-10-CM

## 2017-05-01 ENCOUNTER — Inpatient Hospital Stay: Payer: Medicare HMO | Attending: Adult Health | Admitting: Adult Health

## 2017-05-01 ENCOUNTER — Encounter: Payer: Self-pay | Admitting: Adult Health

## 2017-05-01 ENCOUNTER — Telehealth: Payer: Self-pay | Admitting: Oncology

## 2017-05-01 ENCOUNTER — Inpatient Hospital Stay: Payer: Medicare HMO

## 2017-05-01 VITALS — BP 149/80 | HR 72 | Temp 98.2°F | Resp 17 | Ht 65.0 in

## 2017-05-01 DIAGNOSIS — C50511 Malignant neoplasm of lower-outer quadrant of right female breast: Secondary | ICD-10-CM | POA: Diagnosis not present

## 2017-05-01 DIAGNOSIS — Z79811 Long term (current) use of aromatase inhibitors: Secondary | ICD-10-CM

## 2017-05-01 DIAGNOSIS — Z170 Estrogen receptor positive status [ER+]: Principal | ICD-10-CM

## 2017-05-01 DIAGNOSIS — I10 Essential (primary) hypertension: Secondary | ICD-10-CM | POA: Diagnosis not present

## 2017-05-01 DIAGNOSIS — Z8673 Personal history of transient ischemic attack (TIA), and cerebral infarction without residual deficits: Secondary | ICD-10-CM | POA: Diagnosis not present

## 2017-05-01 DIAGNOSIS — E119 Type 2 diabetes mellitus without complications: Secondary | ICD-10-CM

## 2017-05-01 LAB — CMP (CANCER CENTER ONLY)
ALT: 13 U/L (ref 0–55)
ANION GAP: 7 (ref 3–11)
AST: 15 U/L (ref 5–34)
Albumin: 3.2 g/dL — ABNORMAL LOW (ref 3.5–5.0)
Alkaline Phosphatase: 58 U/L (ref 40–150)
BILIRUBIN TOTAL: 0.5 mg/dL (ref 0.2–1.2)
BUN: 20 mg/dL (ref 7–26)
CO2: 27 mmol/L (ref 22–29)
Calcium: 9.6 mg/dL (ref 8.4–10.4)
Chloride: 109 mmol/L (ref 98–109)
Creatinine: 1.13 mg/dL — ABNORMAL HIGH (ref 0.60–1.10)
GFR, EST NON AFRICAN AMERICAN: 46 mL/min — AB (ref >60)
GFR, Est AFR Am: 53 mL/min — ABNORMAL LOW (ref >60)
Glucose, Bld: 146 mg/dL — ABNORMAL HIGH (ref 70–140)
POTASSIUM: 4.2 mmol/L (ref 3.5–5.1)
Sodium: 143 mmol/L (ref 136–145)
TOTAL PROTEIN: 7 g/dL (ref 6.4–8.3)

## 2017-05-01 LAB — CBC WITH DIFFERENTIAL (CANCER CENTER ONLY)
BASOS ABS: 0 10*3/uL (ref 0.0–0.1)
BASOS PCT: 1 %
EOS PCT: 3 %
Eosinophils Absolute: 0.1 10*3/uL (ref 0.0–0.5)
HCT: 36.5 % (ref 34.8–46.6)
Hemoglobin: 11.3 g/dL — ABNORMAL LOW (ref 11.6–15.9)
LYMPHS PCT: 18 %
Lymphs Abs: 0.8 10*3/uL — ABNORMAL LOW (ref 0.9–3.3)
MCH: 26.2 pg (ref 25.1–34.0)
MCHC: 31 g/dL — ABNORMAL LOW (ref 31.5–36.0)
MCV: 84.6 fL (ref 79.5–101.0)
Monocytes Absolute: 0.5 10*3/uL (ref 0.1–0.9)
Monocytes Relative: 13 %
Neutro Abs: 2.8 10*3/uL (ref 1.5–6.5)
Neutrophils Relative %: 65 %
PLATELETS: 185 10*3/uL (ref 145–400)
RBC: 4.32 MIL/uL (ref 3.70–5.45)
RDW: 14.8 % — AB (ref 11.2–14.5)
WBC: 4.3 10*3/uL (ref 3.9–10.3)

## 2017-05-01 NOTE — Progress Notes (Signed)
[place]  [phone] [phone]     ID: [name] [name] DOB: 06/23/1940  MR#: [id number]  JYN#:[id number]  Patient Care Team: [name], MD as PCP - General (Internal Medicine) Scot Dock, NP OTHER MD:  CHIEF COMPLAINT: Estrogen receptor positive breast cancer  CURRENT TREATMENT: Neoadjuvant anastrozole   BREAST CANCER HISTORY: From the original intake note:  The patient tells me she herself noted a mass in her right breast and brought it to her primary care physician's attention. However what she had at the [facility] 04/04/2016 was bilateral screening mammography with tomography The breast density was category C. In the right breast there was an area of possible distortion and the patient was brought back for right diagnostic mammography with tomography and right breast ultrasonography 04/18/2016. In the right breast lower outer quadrant there was a 1.9 cm spiculated mass. There were faint linear microcalcifications extending anteriorly and posteriorly from the mass. On palpation there was a firm fixed mass in the right breast at the 7:00 position, which on ultrasound measured 2.0 cm, 5 cm from the nipple at the 7:00 position. In the right axilla ultrasound showed 1 abnormal lymph node with asymmetric cortical thickening.  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  Patient's subsequent history is as detailed below  INTERVAL HISTORY: [name] [name] returns today for follow-up of her estrogen receptor positive breast cancer, which is being treated with anastrozole in an attempt to avoid surgery in  this high risk patients. She is accompanied by one of her daughters.  REVIEW OF SYSTEMS: [name] [name] is doing well today.  She has noticed an increase of mucous in her throat over the past few days, but denies any other difficulties.  She can no longer feel her right breast mass.  [name] [name] has had a stroke.  She lives at home with her two grandsons, and her daughters check in on her and bring her to appointments.  She also has home health nursing who comes and assists her.  She has residual right sided weakness, and aphasia.  She denies pain, and can write her concerns down with her note pad, which she stated other than the mucous that she had none.    PAST MEDICAL HISTORY: Past Medical History:  Diagnosis Date  . Angina   . Cardiac arrest ([place]) 12/29/2016  . Cardiac arrest with ventricular fibrillation ([place])   . CHF (congestive heart failure) ([place])   . Complication of anesthesia    lung   . Diabetes mellitus ([place])   . GERD (gastroesophageal reflux disease)   . H/O hiatal hernia   . Hypercholesteremia   . Hypertension   . Myocardial infarction [facility])    CABG 2001, ? stent. Cath 02/2011 with new distal LIMA-LAD 80%. SVG-OM occlusion is old, rest of grafts patent  . NSTEMI (non-ST elevated myocardial infarction) ([place]) 07/27/2011  . Peripheral vascular disease ([place])   . Stroke [facility]) 2002 and 2003   Resultant right hemiparesis and aphasia. Communicates by writing     PAST SURGICAL HISTORY: Past Surgical History:  Procedure Laterality Date  . ABDOMINAL HYSTERECTOMY    . BREAST BIOPSY    . BREAST  CYST EXCISION Left    years ago per daughter  . CARDIAC CATHETERIZATION  02/2011  . CORONARY ARTERY BYPASS GRAFT    . ESOPHAGOGASTRODUODENOSCOPY  03/12/2011   Procedure: ESOPHAGOGASTRODUODENOSCOPY (EGD);  Surgeon: [name], MD;  Location: [facility] ENDOSCOPY;  Service: Endoscopy;  Laterality: N/A;  . LEFT HEART CATHETERIZATION WITH CORONARY ANGIOGRAM N/A 03/10/2011   Procedure: LEFT HEART CATHETERIZATION  WITH CORONARY ANGIOGRAM;  Surgeon: [name], MD;  Location: [facility] CATH LAB;  Service: Cardiovascular;  Laterality: N/A;    FAMILY HISTORY Family History  Problem Relation Age of Onset  . Heart disease Mother   . Stroke Father   . Diabetes Sister   . Diabetes Brother   . Anesthesia problems Neg Hx   . Hypotension Neg Hx   . Malignant hyperthermia Neg Hx   . Pseudochol deficiency Neg Hx   A stepsister had breast cancer diagnosed in her 90s   GYNECOLOGIC HISTORY:  No LMP recorded. Patient has had a hysterectomy.  menarche age 75, first live birth age 10., The patient is [name] [name] P2. She underwent remote hysterectomy, she thinks without salpingo-oophorectomy. She did not use hormone replacement.   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 She is widowed and lives independently. Her grandson [name] [name], 61 years old, is there at night. The patient has a nurse who comes during the day to help with activities of daily living. The patient tells me she is able to transfer from chair to toilet and back and from bed to chair and. She communicates by writing. She has 6 grandchildren and one great-grandchild. She attends [employer].     ADVANCED DIRECTIVES:  the patient's healthcare power of attorney is her daughter [name] [name], who can be reached at [phone]. The patient does have a living will in place.    HEALTH MAINTENANCE: Social History   Tobacco Use  . Smoking status: Never Smoker  . Smokeless tobacco: Never Used  Substance Use Topics  . Alcohol use: No  . Drug use: No     Colonoscopy:  PAP:  Bone density:   Allergies  Allergen Reactions  . Penicillins Swelling    Swelling of mouth Has patient had a PCN reaction causing immediate rash, facial/tongue/throat swelling, SOB or lightheadedness with hypotension: YES Has patient had a PCN reaction causing severe rash involving mucus membranes or skin necrosis: NO Has patient had a PCN reaction that required  hospitalization NO Has patient had a PCN reaction occurring within the last 10 years: NO If all of the above answers are "NO", then may proceed with Cephalosporin use.    Current Outpatient Medications  Medication Sig Dispense Refill  . acetaminophen (TYLENOL) 325 MG tablet Take 2 tablets (650 mg total) by mouth every 6 (six) hours as needed for mild pain, fever or headache.    . anastrozole (ARIMIDEX) 1 MG tablet Take 1 tablet (1 mg total) by mouth daily. 90 tablet 4  . aspirin 81 MG chewable tablet Chew 1 tablet (81 mg total) daily by mouth. 30 tablet 0  . atorvastatin (LIPITOR) 40 MG tablet Take 1 tablet (40 mg total) daily at 6 PM by mouth. 30 tablet 0  . bisacodyl (DULCOLAX) 10 MG suppository Place 10 mg as needed rectally for moderate constipation.    . carvedilol (COREG) 3.125 MG tablet Take 1 tablet (3.125 mg total) 2 (two) times daily with a meal by mouth. 60 tablet 0  . cholecalciferol (VITAMIN D) 1000 units tablet Take  1,000 Units by mouth daily.    . clopidogrel (PLAVIX) 75 MG tablet Take 75 mg by mouth daily.      Marland Kitchen escitalopram (LEXAPRO) 20 MG tablet Take 20 mg by mouth daily.    . furosemide (LASIX) 40 MG tablet Take 1 tablet (40 mg total) daily by mouth. 30 tablet 0  . gabapentin (NEURONTIN) 100 MG capsule Take 1 capsule (100 mg total) by mouth 3 (three) times daily. 90 capsule 0  . insulin aspart (NOVOLOG) 100 UNIT/ML injection Inject 0-15 Units into the skin 3 (three) times daily with meals. 10 mL 11  . insulin aspart (NOVOLOG) 100 UNIT/ML injection Inject 0-5 Units into the skin at bedtime. 10 mL 11  . insulin aspart (NOVOLOG) 100 UNIT/ML injection Inject 6 Units into the skin 3 (three) times daily with meals. 10 mL 11  . insulin glargine (LANTUS) 100 UNIT/ML injection Inject 0.2 mLs (20 Units total) into the skin at bedtime. 10 mL 11  . lisinopril (PRINIVIL,ZESTRIL) 10 MG tablet Take 1 tablet (10 mg total) daily by mouth. 30 tablet 0  . magnesium hydroxide (MILK OF  MAGNESIA) 400 MG/5ML suspension Take 30 mLs daily as needed by mouth for mild constipation.    . Multiple Vitamin (MULTIVITAMIN WITH MINERALS) TABS tablet Take 1 tablet by mouth daily.    . nitroGLYCERIN (NITROSTAT) 0.4 MG SL tablet Place 0.4 mg under the tongue every 5 (five) minutes as needed for chest pain.    Marland Kitchen spironolactone (ALDACTONE) 25 MG tablet Take 0.5 tablets (12.5 mg total) daily by mouth. 15 tablet 0   No current facility-administered medications for this visit.     OBJECTIVE:  Vitals:   05/01/17 1330  BP: (!) 149/80  Pulse: 72  Resp: 17  Temp: 98.2 F (36.8 C)  SpO2: 100%     Body mass index is 31.7 kg/m.    ECOG FS:2 - Symptomatic, <50% confined to bed GENERAL: Patient is an older chronically ill woman in NAD sitting in wheelchair HEENT:  Sclerae anicteric.  Oropharynx clear and moist. No ulcerations or evidence of oropharyngeal candidiasis. Neck is supple.  NODES:  No cervical, supraclavicular, or axillary lymphadenopathy palpated.  BREAST EXAM:  Very limited since patient was sitting, but could not palpate abnormality. LUNGS:  Clear to auscultation bilaterally.  No wheezes or rhonchi. HEART:  Regular rate and rhythm. No murmur appreciated. ABDOMEN:  Soft, nontender.  Positive, normoactive bowel sounds. No organomegaly palpated. MSK:  No focal spinal tenderness to palpation. EXTREMITIES:  Right leg edema since CVA SKIN:  Clear with no obvious rashes or skin changes. No nail dyscrasia. NEURO:  Nonfocal. Well oriented.  Appropriate affect.    LAB RESULTS:  CMP     Component Value Date/Time   NA 143 05/01/2017 1312   K 4.2 05/01/2017 1312   CL 109 05/01/2017 1312   CO2 27 05/01/2017 1312   GLUCOSE 146 (H) 05/01/2017 1312   BUN 20 05/01/2017 1312   CREATININE 1.13 (H) 05/01/2017 1312   CALCIUM 9.6 05/01/2017 1312   PROT 7.0 05/01/2017 1312   ALBUMIN 3.2 (L) 05/01/2017 1312   AST 15 05/01/2017 1312   ALT 13 05/01/2017 1312   ALKPHOS 58 05/01/2017 1312    BILITOT 0.5 05/01/2017 1312   GFRNONAA 46 (L) 05/01/2017 1312   GFRAA 53 (L) 05/01/2017 1312    No results found for: TOTALPROTELP, ALBUMINELP, A1GS, A2GS, BETS, BETA2SER, GAMS, MSPIKE, SPEI  No results found for: KPAFRELGTCHN, LAMBDASER, KAPLAMBRATIO  Lab Results  Component Value Date   WBC 4.3 05/01/2017   NEUTROABS 2.8 05/01/2017   HGB 9.9 (L) 01/13/2017   HCT 36.5 05/01/2017   MCV 84.6 05/01/2017   PLT 185 05/01/2017      Chemistry      Component Value Date/Time   NA 143 05/01/2017 1312   K 4.2 05/01/2017 1312   CL 109 05/01/2017 1312   CO2 27 05/01/2017 1312   BUN 20 05/01/2017 1312   CREATININE 1.13 (H) 05/01/2017 1312      Component Value Date/Time   CALCIUM 9.6 05/01/2017 1312   ALKPHOS 58 05/01/2017 1312   AST 15 05/01/2017 1312   ALT 13 05/01/2017 1312   BILITOT 0.5 05/01/2017 1312       No results found for: LABCA2  No components found for: YBOFBP102  No results for input(s): INR in the last 168 hours.  Urinalysis    Component Value Date/Time   COLORURINE STRAW (A) 01/11/2017 1932   APPEARANCEUR CLEAR 01/11/2017 1932   LABSPEC 1.018 01/11/2017 1932   PHURINE 7.0 01/11/2017 1932   GLUCOSEU >=500 (A) 01/11/2017 1932   HGBUR NEGATIVE 01/11/2017 1932   BILIRUBINUR NEGATIVE 01/11/2017 1932   KETONESUR NEGATIVE 01/11/2017 1932   PROTEINUR NEGATIVE 01/11/2017 1932   UROBILINOGEN 1.0 07/13/2013 2255   NITRITE NEGATIVE 01/11/2017 1932   LEUKOCYTESUR NEGATIVE 01/11/2017 1932     STUDIES: 04/24/17 ultrasound showed improvement in disease  ELIGIBLE FOR AVAILABLE RESEARCH PROTOCOL: no  ASSESSMENT: 77 y.o. [place] woman status post right breast lower outer quadrant biopsy 04/25/2016 for a clinical T1-T2, N2, stage IIA invasive lobular carcinoma, grade 2, E-cadherin negative, estrogen and progesterone receptor positive, HER-2 nonamplified, with an MIB-1 of 5%  (1) definitive surgery (optimally right mastectomy with targeted axillary dissection)  pending  (a) patient is felt to be high surgical risk by cardiology  (2) anastrozole started 05/28/2016   PLAN: I reviewed [name] [name]'s recent ultrasound from 04/24/17.  Originally in 04/18/16 her mass was 1.3 x 1.1 x 2 cm.  Last week it was 0.8 x 1.3 x 0.6 cm.  She is happy with those results.  After discussion with Dr. Jana Hakim, she will undergo a repeat ultrasound in 6 months and see him about a week later.  [name] [name] and her daughter are in agreement with this plan.    They know to call for any other issues that may develop before the next visit here.  A total of (30) minutes of face-to-face time was spent with this patient with greater than 50% of that time in counseling and care-coordination.  Scot Dock, NP   05/01/2017 2:54 PM Medical Oncology and Hematology [facility] [address] [place], [place] [address] Tel. [phone]    Fax. [phone]

## 2017-05-01 NOTE — Telephone Encounter (Signed)
Gave avs and calendar ° °

## 2017-05-06 ENCOUNTER — Telehealth: Payer: Self-pay

## 2017-05-06 NOTE — Telephone Encounter (Signed)
Per Wilber Bihari NP, notified pt's daughter [name] via phone and let her know that the patient's creatinine was elevated slightly and to encourage her to drink more water.  Daughter voiced understanding and states she will go by her mother's home tonight and discuss this with her.  No other needs per pt's daughter at this time.

## 2017-10-02 ENCOUNTER — Other Ambulatory Visit: Payer: Self-pay | Admitting: Oncology

## 2017-11-03 ENCOUNTER — Inpatient Hospital Stay: Payer: Medicare HMO | Admitting: Oncology

## 2017-11-03 ENCOUNTER — Inpatient Hospital Stay: Payer: Medicare HMO

## 2017-11-03 ENCOUNTER — Other Ambulatory Visit: Payer: Self-pay | Admitting: Oncology

## 2017-11-24 DEATH — deceased

## 2018-04-17 IMAGING — CR DG CHEST 2V
2 series · 2 of 2 positions shown · non-contrast
Comparison: Portable chest x-ray January 27, 2014

CLINICAL DATA: Shortness of breath for the past 2 months with no
associated symptoms ; history previous CVA, CABG and coronary
angioplasty, previous MI, CHF

EXAM:
CHEST  2 VIEW

[w chest pa]
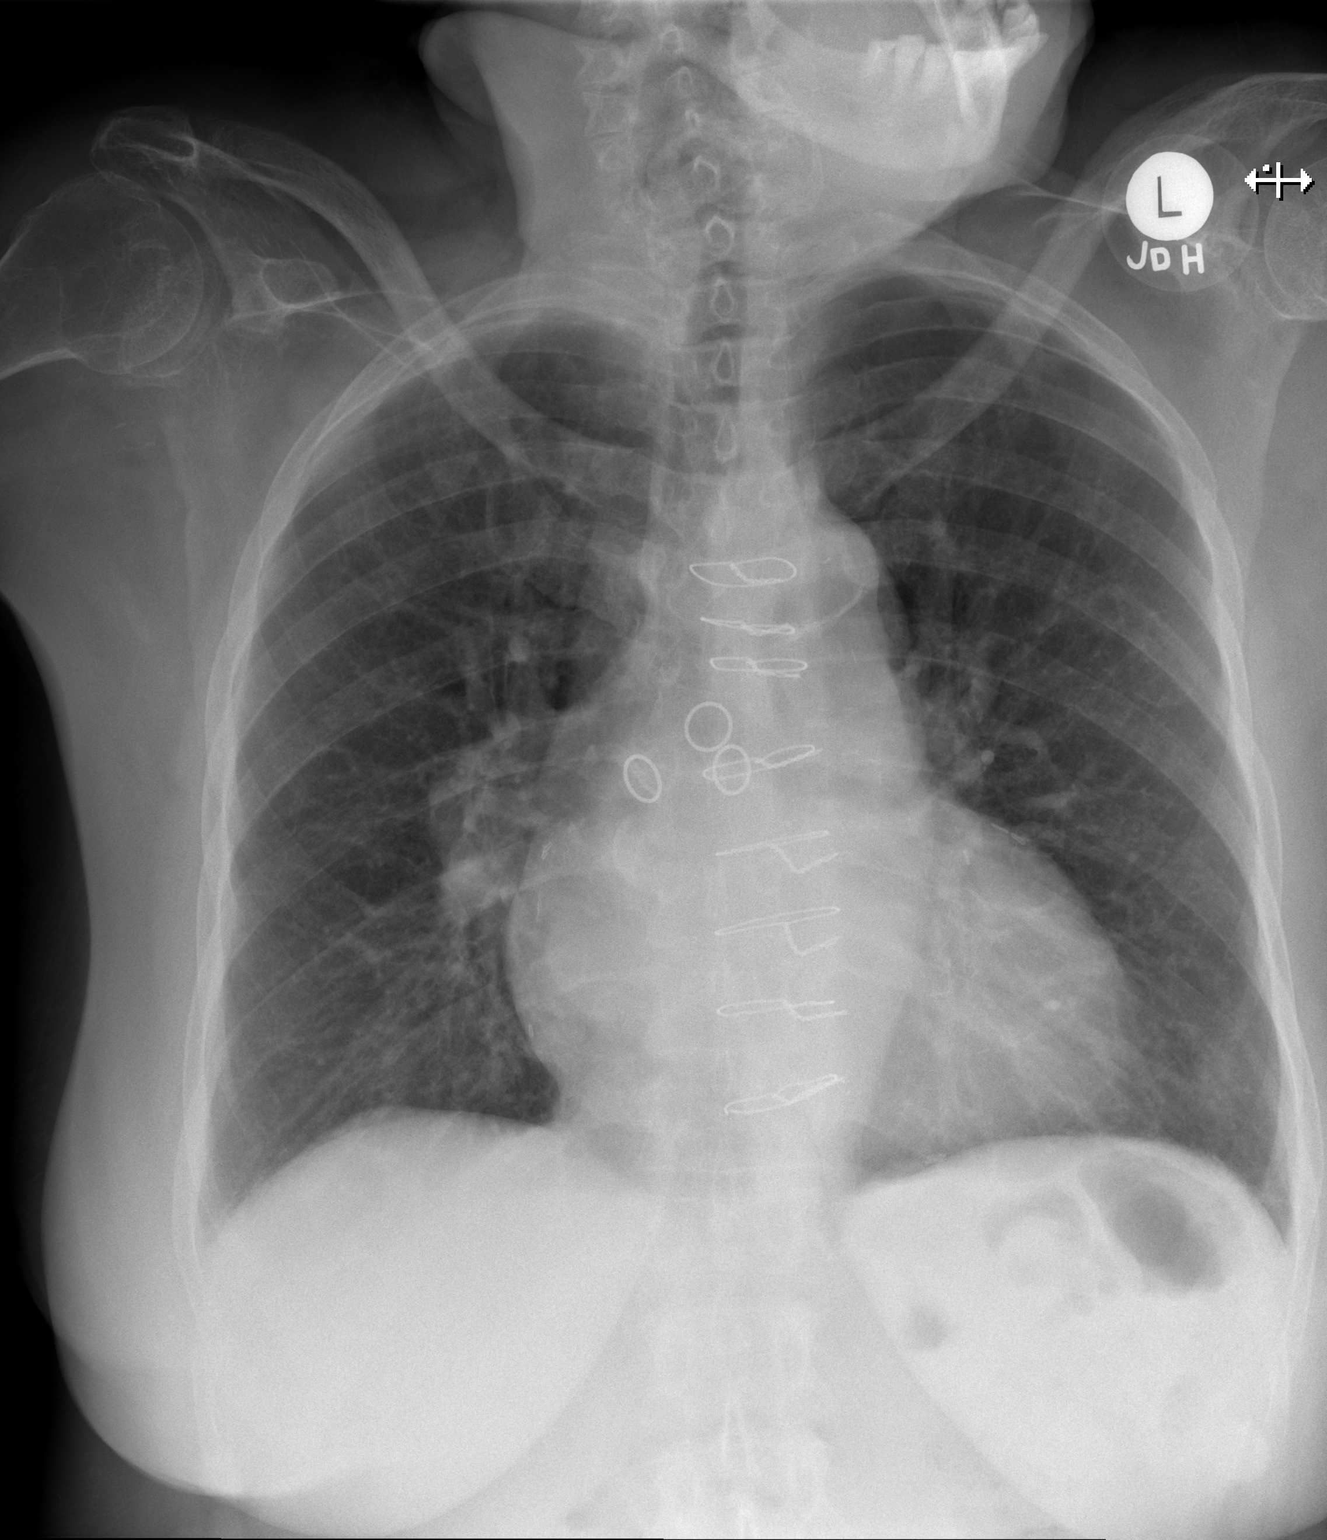

[w chest lat]
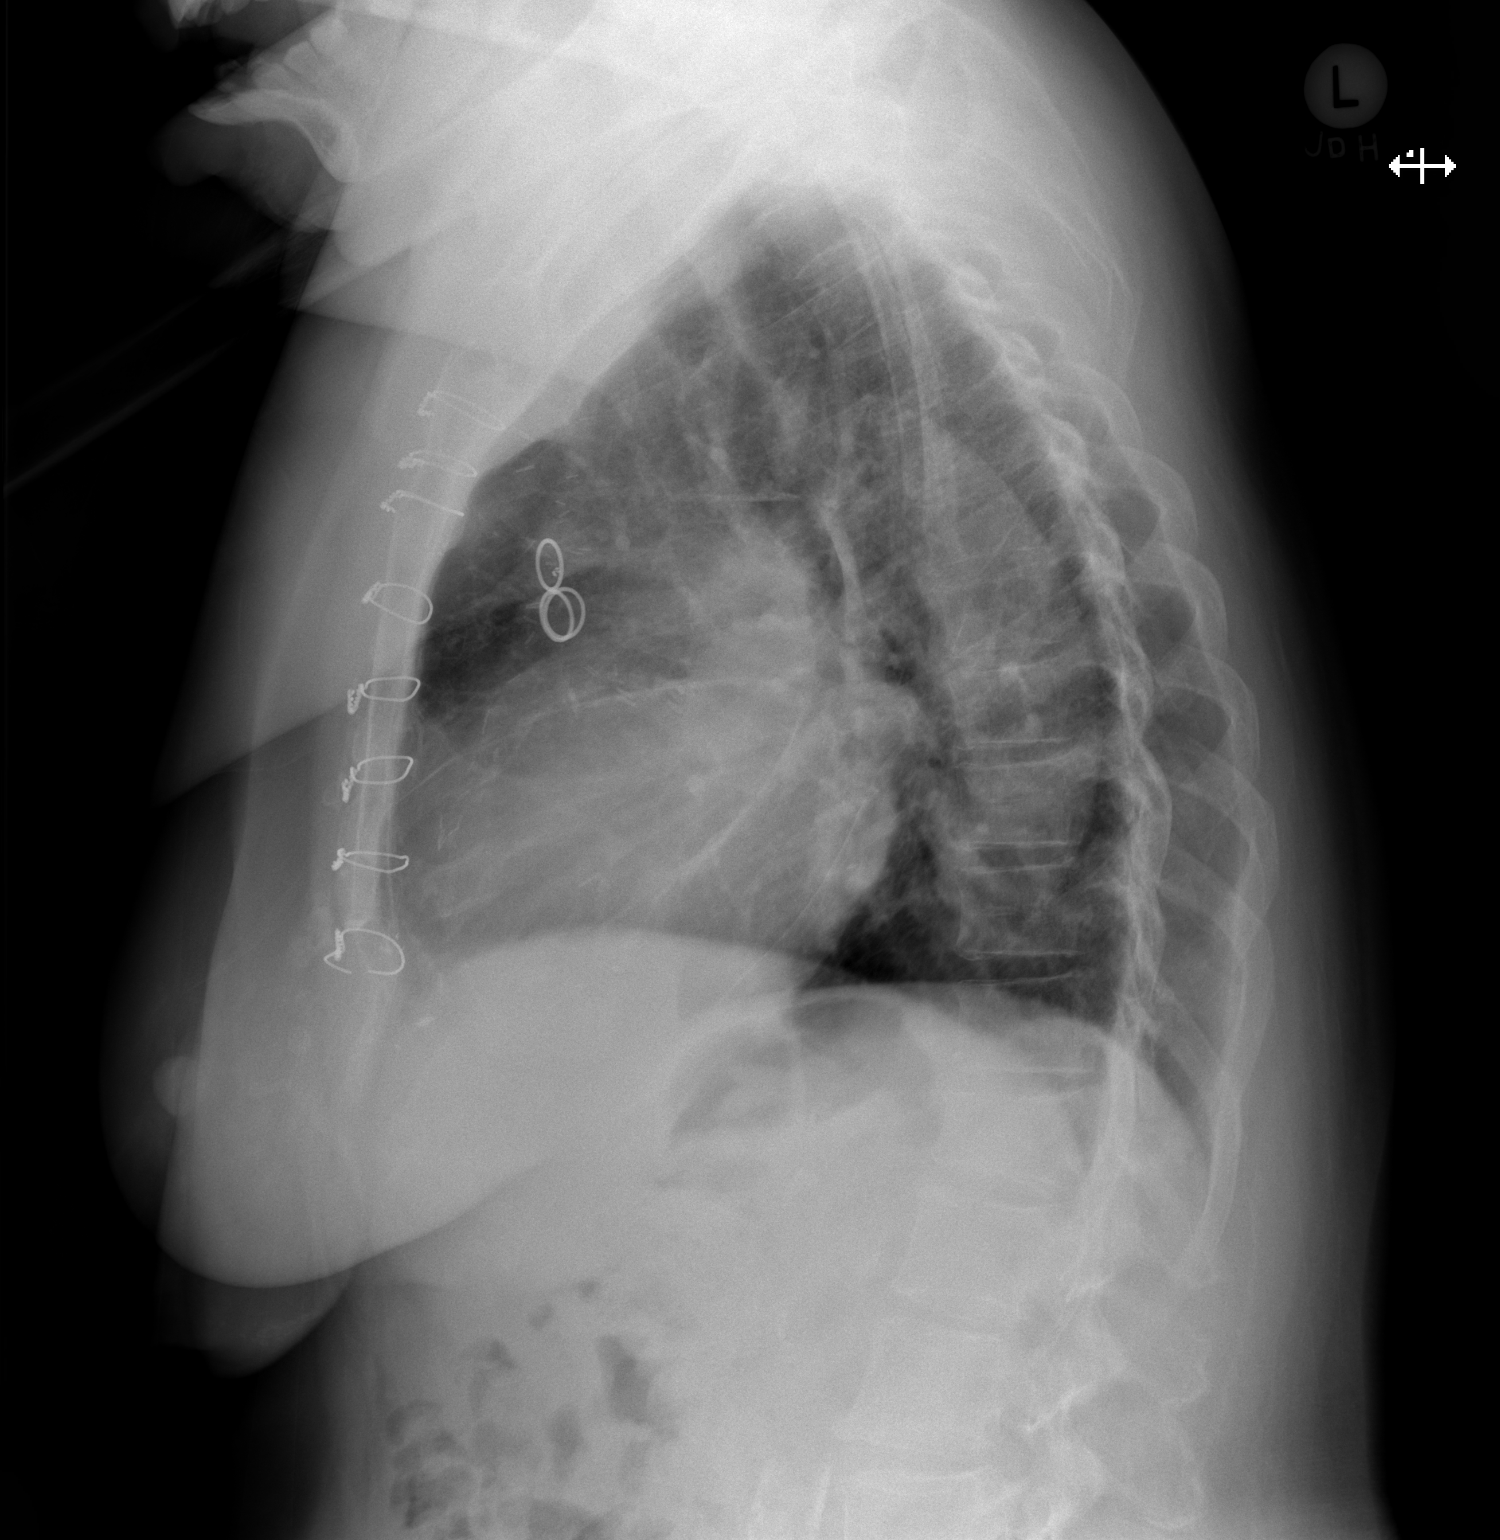

[2 of 2 positions shown; findings below may reference images not displayed]

FINDINGS: The lungs are well-expanded. The interstitial markings are mildly
prominent though less conspicuous than in the past. There is no
alveolar infiltrate. There is no pleural effusion. The heart is
top-normal in size. There is mild central pulmonary vascular
prominence and mild cephalization which is stable. There are post
CABG changes. The mediastinum is normal in width. There is
tortuosity of the descending thoracic aorta. The bony thorax
exhibits no acute abnormality.
IMPRESSION: Mild chronic interstitial prominence may reflect low-grade
compensated CHF. There is stable central pulmonary vascular
congestion and borderline cardiomegaly. There is no pneumonia nor
pleural effusion.

## 2019-03-27 IMAGING — DX DG ANKLE COMPLETE 3+V*R*
3 series · 3 of 3 positions shown · non-contrast
Comparison: 12/18/2014

CLINICAL DATA: Patient reports she has had a wound that has been
getting worse X 1 month, patient denies any chest complaints at this
time HX stroke, CHF

EXAM:
RIGHT ANKLE - COMPLETE 3+ VIEW

[ankle ap]
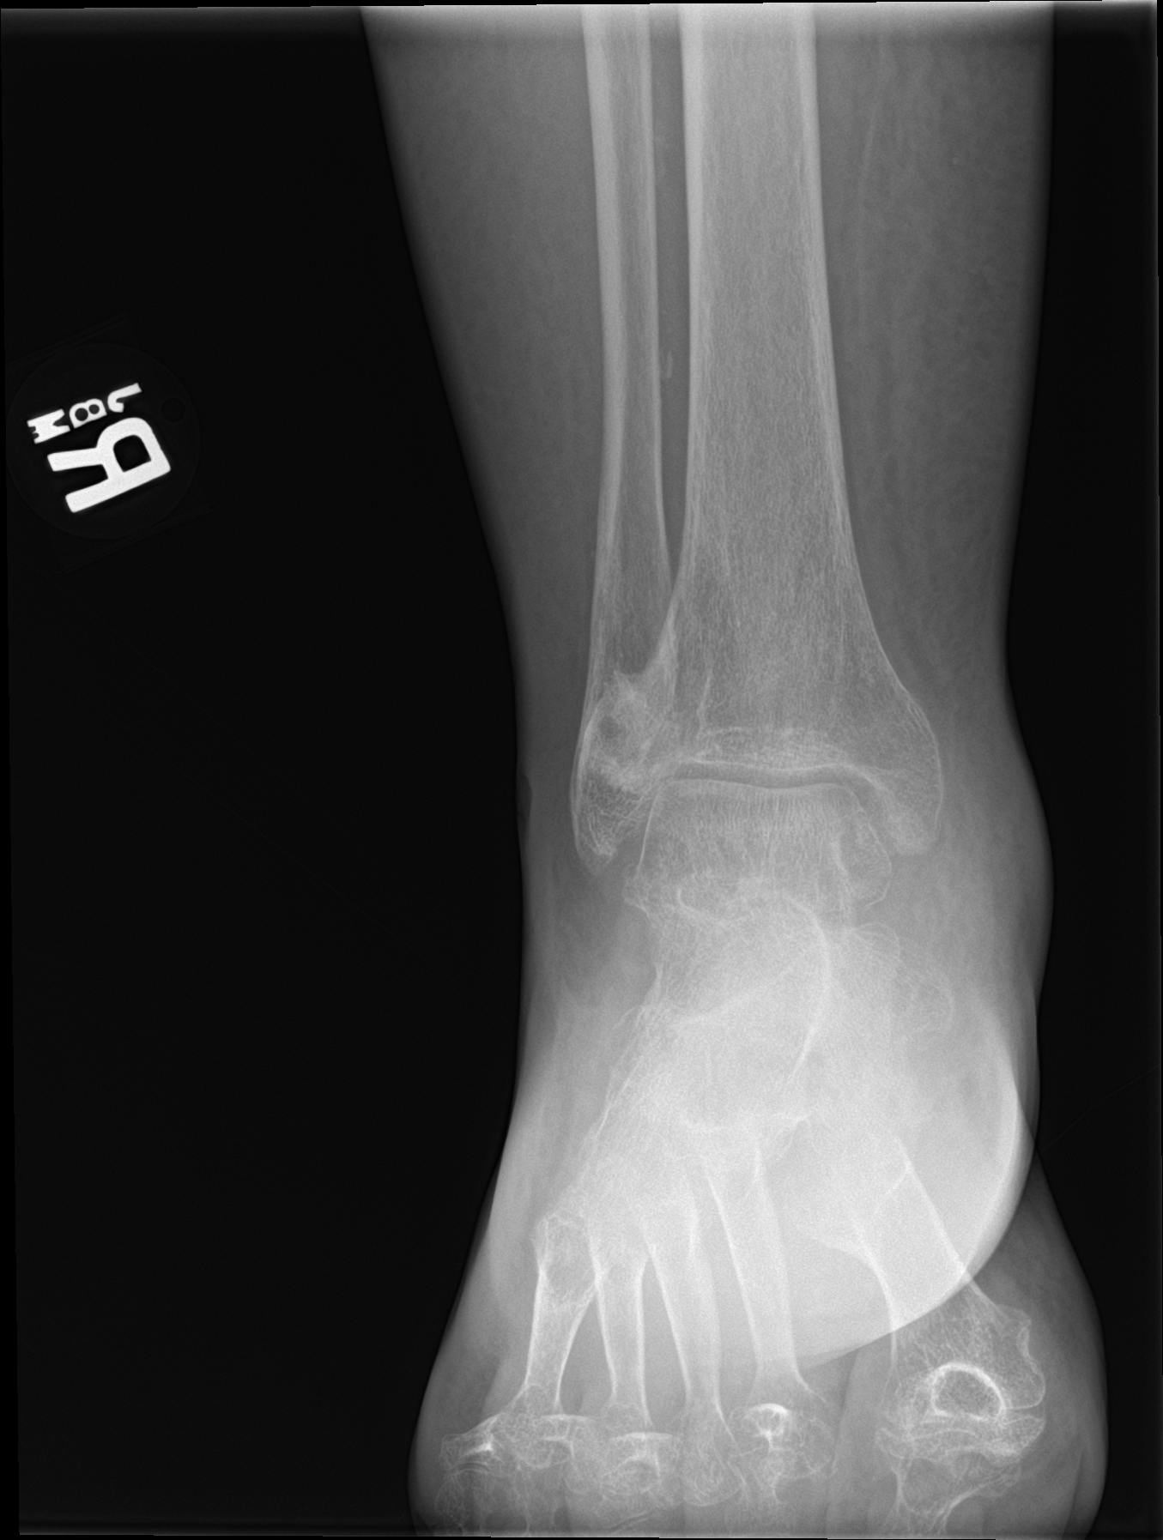

[ankle obl]
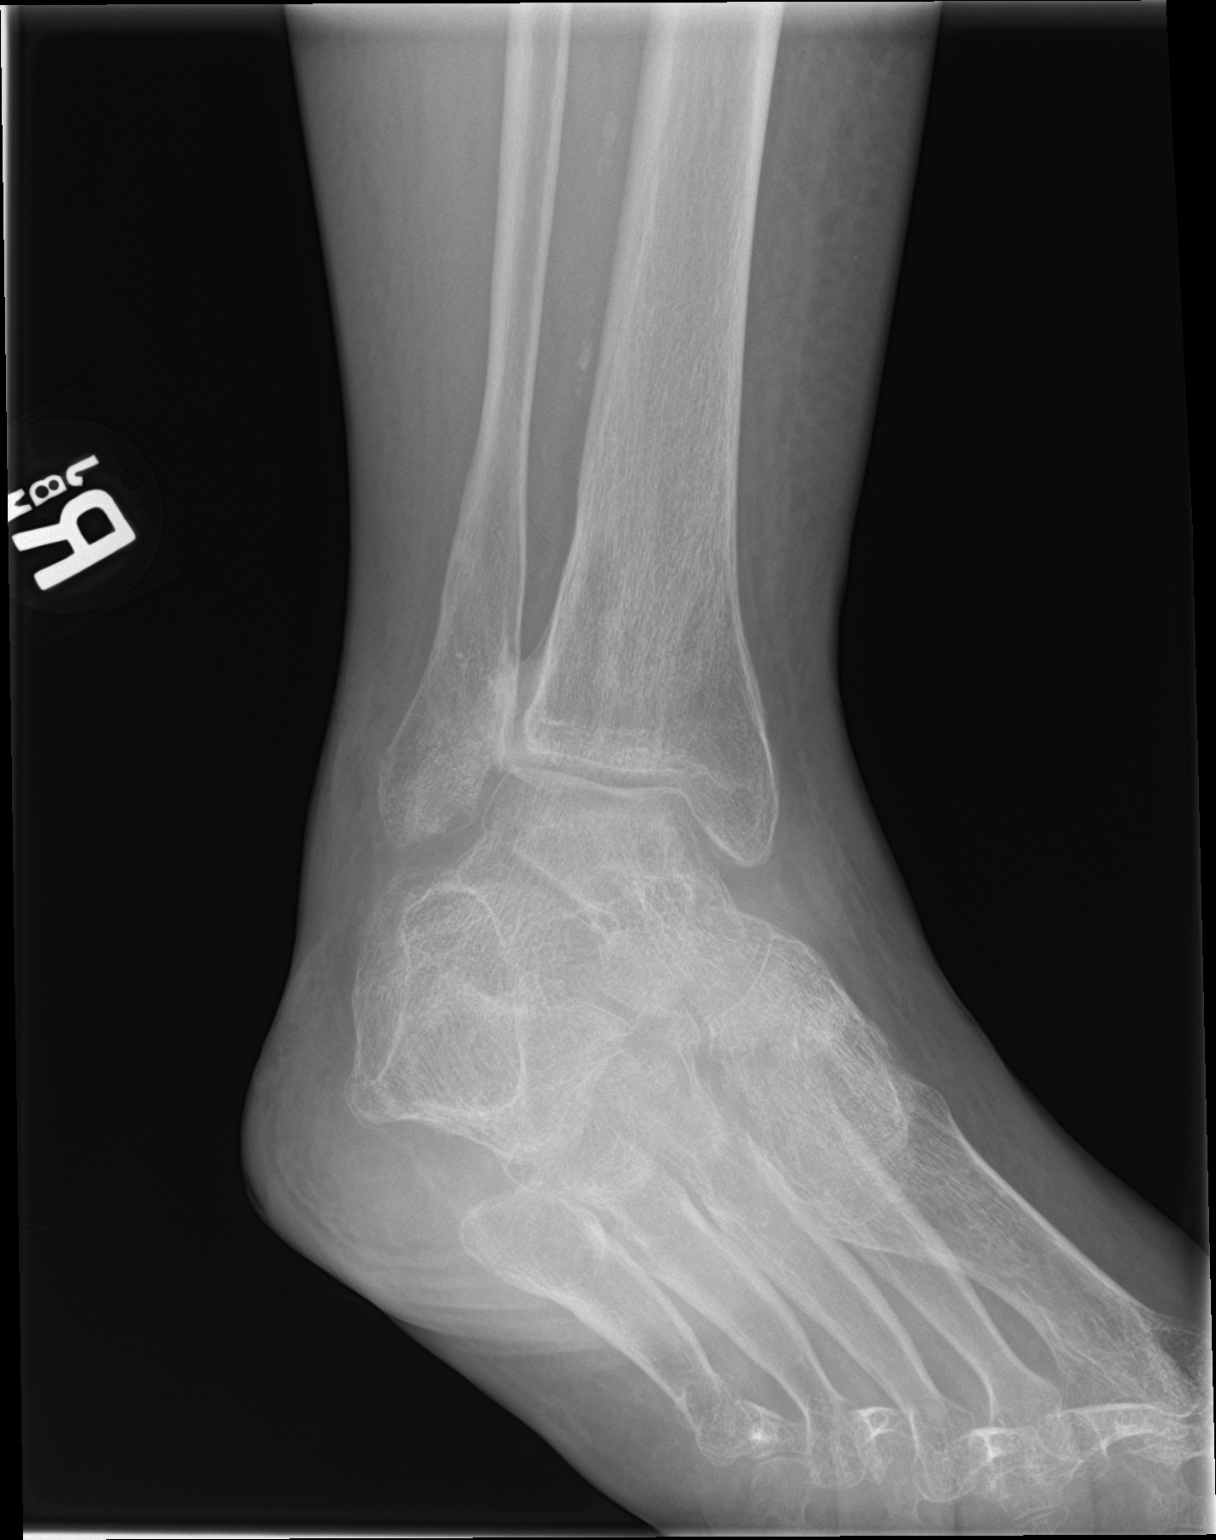

[ankle lat]
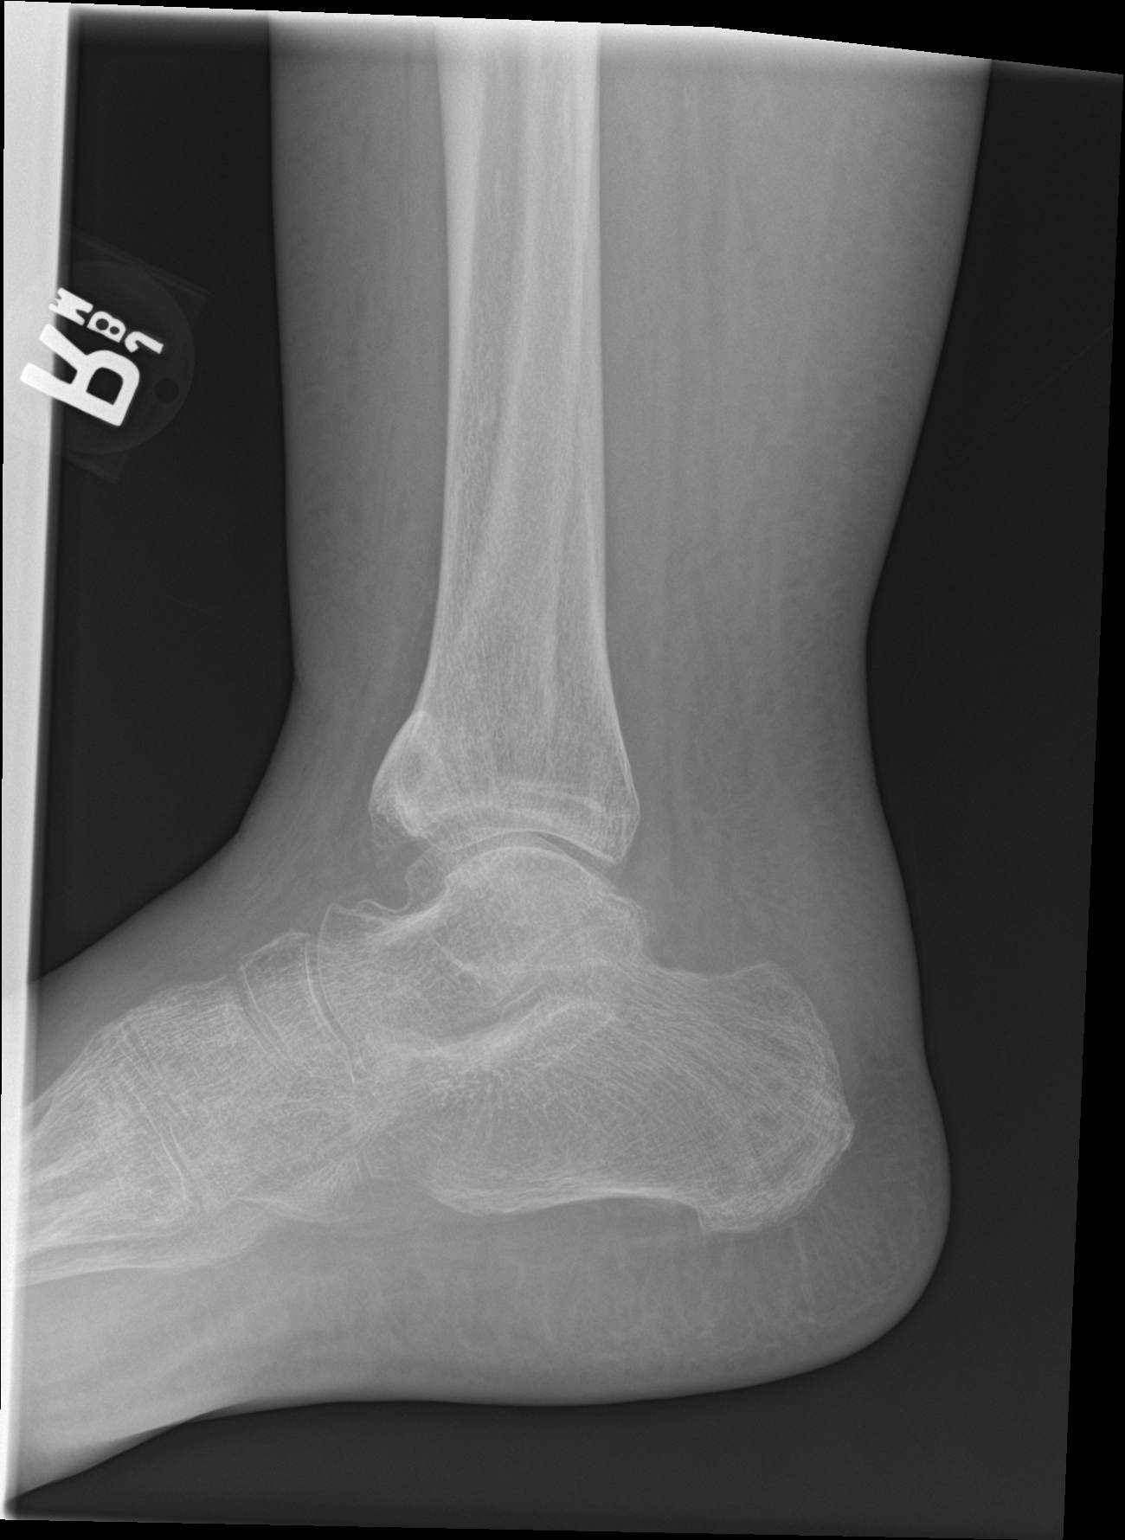

[3 of 3 positions shown; findings below may reference images not displayed]

FINDINGS: No fracture. There is a chronic area of bony prominence and
sclerosis along the anterolateral aspect of the distal tibia.

There is no bone resorption to suggest osteomyelitis. Bones are
demineralized.

The ankle mortise is normally spaced and aligned.

There is diffuse soft tissue swelling.  No soft tissue air.
IMPRESSION: 1. No fracture or dislocation.
2. No evidence of osteomyelitis.

## 2019-03-28 IMAGING — DX DG CHEST 1V PORT
1 series · 1 of 1 positions shown · non-contrast
Comparison: 07/12/2016

CLINICAL DATA: CHFDiabetesHTNHx of MIStroke

EXAM:
PORTABLE CHEST 1 VIEW

[chest ap]
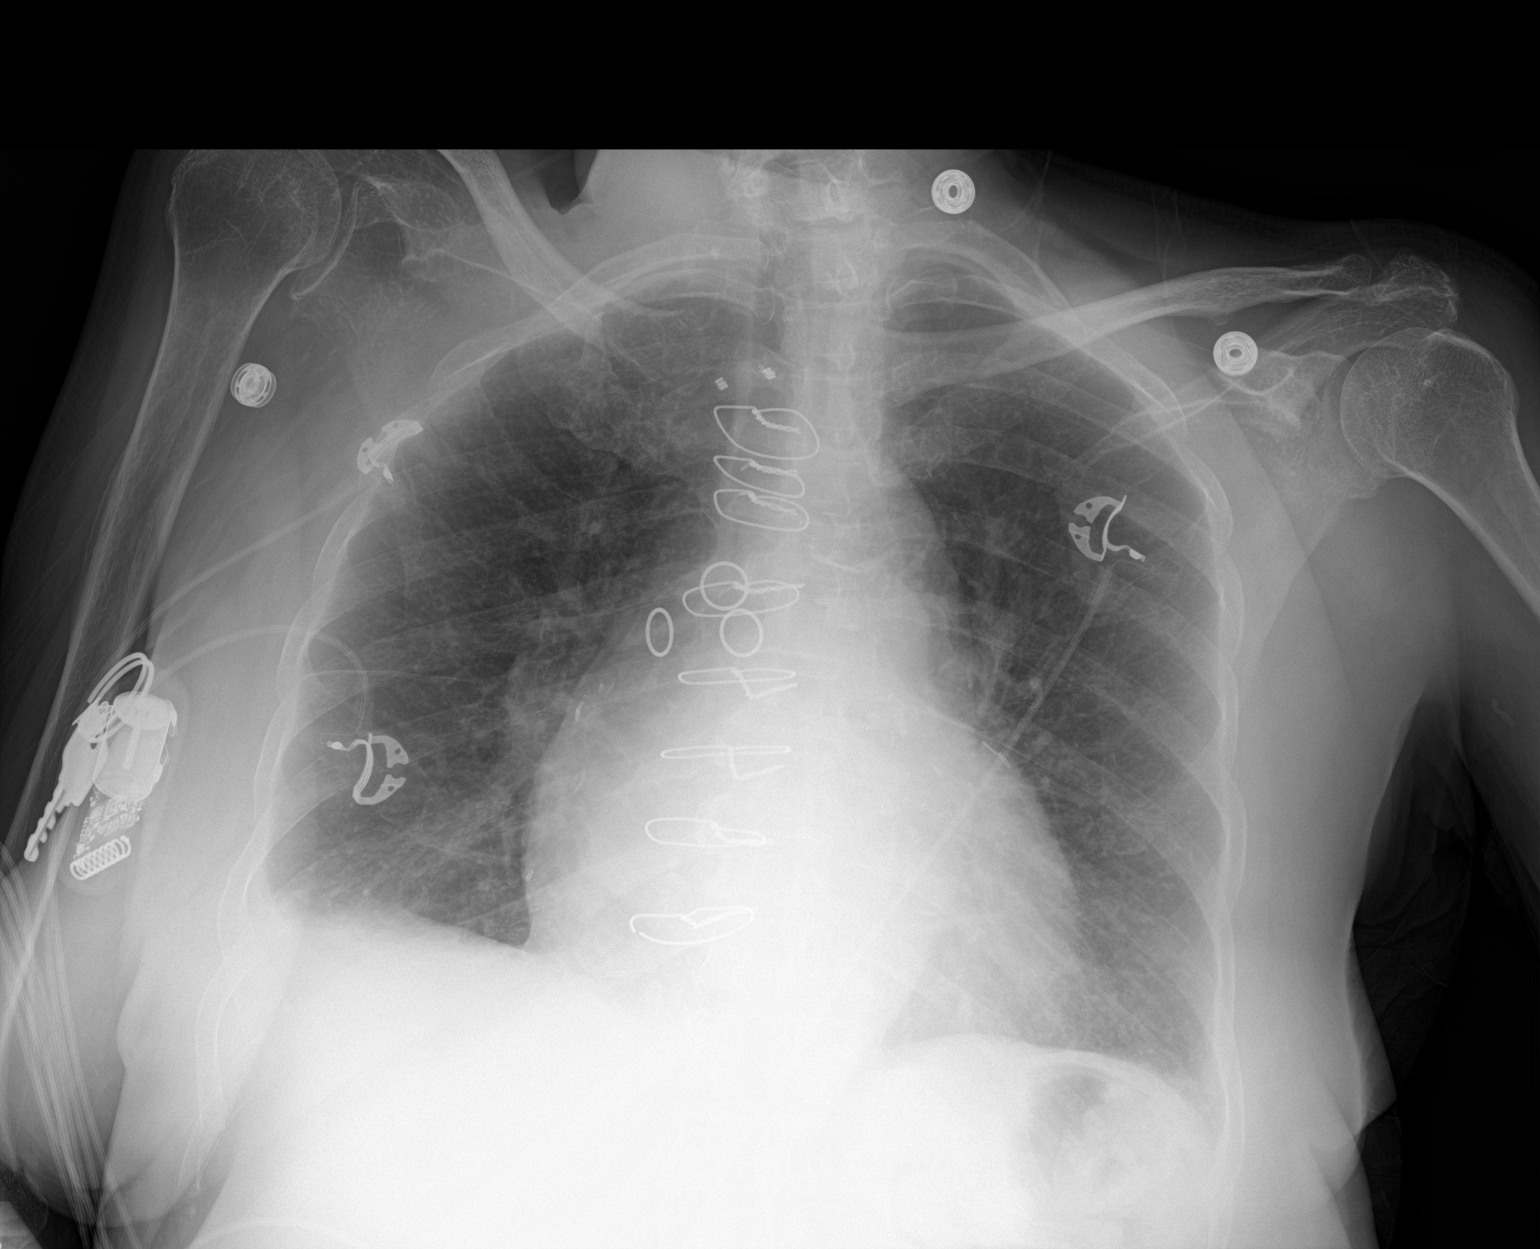

[1 of 1 positions shown; findings below may reference images not displayed]

FINDINGS: Changes from CABG surgery are stable. Cardiac silhouette is mildly
enlarged. No mediastinal hilar masses.

Prominent bronchovascular markings and interstitial thickening has
mildly improved. There is hazy opacity at the right lung base
consistent with atelectasis possibly with a small effusion, stable.
Mild thickening of the minor fissure appears less prominent than the
previous day's study.

No lung consolidation is seen to suggest pneumonia. There is no
pneumothorax.
IMPRESSION: 1. Mild improvement in lung aeration from the previous day's exam
consistent with improved congestive heart failure/interstitial
edema. No new abnormalities.
